# Patient Record
Sex: Female | Born: 1970 | Race: Black or African American | Hispanic: No | Marital: Married | Smoking: Former smoker
Health system: Southern US, Community
[De-identification: ages and names within clinical notes are randomized; demographics above are authoritative.]

## PROBLEM LIST (undated history)

## (undated) DIAGNOSIS — N6452 Nipple discharge: Secondary | ICD-10-CM

## (undated) DIAGNOSIS — G8929 Other chronic pain: Secondary | ICD-10-CM

## (undated) DIAGNOSIS — M1990 Unspecified osteoarthritis, unspecified site: Secondary | ICD-10-CM

## (undated) DIAGNOSIS — J45909 Unspecified asthma, uncomplicated: Secondary | ICD-10-CM

## (undated) DIAGNOSIS — E669 Obesity, unspecified: Secondary | ICD-10-CM

## (undated) DIAGNOSIS — I10 Essential (primary) hypertension: Secondary | ICD-10-CM

## (undated) DIAGNOSIS — K219 Gastro-esophageal reflux disease without esophagitis: Secondary | ICD-10-CM

## (undated) DIAGNOSIS — G5600 Carpal tunnel syndrome, unspecified upper limb: Secondary | ICD-10-CM

## (undated) DIAGNOSIS — E785 Hyperlipidemia, unspecified: Secondary | ICD-10-CM

## (undated) DIAGNOSIS — M549 Dorsalgia, unspecified: Secondary | ICD-10-CM

## (undated) HISTORY — DX: Essential (primary) hypertension: I10

## (undated) HISTORY — DX: Obesity, unspecified: E66.9

## (undated) HISTORY — PX: SPINE SURGERY: SHX786

## (undated) HISTORY — PX: CARPAL TUNNEL RELEASE: SHX101

## (undated) HISTORY — DX: Hyperlipidemia, unspecified: E78.5

## (undated) HISTORY — PX: TONSILLECTOMY: SUR1361

## (undated) HISTORY — DX: Other chronic pain: G89.29

## (undated) HISTORY — DX: Carpal tunnel syndrome, unspecified upper limb: G56.00

## (undated) HISTORY — DX: Unspecified osteoarthritis, unspecified site: M19.90

## (undated) HISTORY — DX: Gastro-esophageal reflux disease without esophagitis: K21.9

## (undated) HISTORY — DX: Dorsalgia, unspecified: M54.9

## (undated) HISTORY — PX: CHOLECYSTECTOMY: SHX55

## (undated) HISTORY — PX: OTHER SURGICAL HISTORY: SHX169

## (undated) HISTORY — DX: Unspecified asthma, uncomplicated: J45.909

---

## 2000-11-25 ENCOUNTER — Encounter: Payer: Self-pay | Admitting: Emergency Medicine

## 2000-11-25 ENCOUNTER — Emergency Department (HOSPITAL_COMMUNITY): Admission: EM | Admit: 2000-11-25 | Discharge: 2000-11-25 | Payer: Self-pay | Admitting: Emergency Medicine

## 2000-12-12 ENCOUNTER — Encounter (HOSPITAL_COMMUNITY): Admission: RE | Admit: 2000-12-12 | Discharge: 2001-01-11 | Payer: Self-pay | Admitting: Family Medicine

## 2001-01-03 HISTORY — PX: ABDOMINAL HYSTERECTOMY: SHX81

## 2001-04-16 ENCOUNTER — Ambulatory Visit (HOSPITAL_COMMUNITY): Admission: RE | Admit: 2001-04-16 | Discharge: 2001-04-16 | Payer: Self-pay | Admitting: Family Medicine

## 2001-04-16 ENCOUNTER — Encounter: Payer: Self-pay | Admitting: Family Medicine

## 2001-05-18 ENCOUNTER — Inpatient Hospital Stay (HOSPITAL_COMMUNITY): Admission: RE | Admit: 2001-05-18 | Discharge: 2001-05-21 | Payer: Self-pay | Admitting: General Surgery

## 2001-08-30 ENCOUNTER — Encounter: Payer: Self-pay | Admitting: Family Medicine

## 2001-08-30 ENCOUNTER — Ambulatory Visit (HOSPITAL_COMMUNITY): Admission: RE | Admit: 2001-08-30 | Discharge: 2001-08-30 | Payer: Self-pay | Admitting: Family Medicine

## 2001-09-15 ENCOUNTER — Encounter: Payer: Self-pay | Admitting: *Deleted

## 2001-09-15 ENCOUNTER — Emergency Department (HOSPITAL_COMMUNITY): Admission: EM | Admit: 2001-09-15 | Discharge: 2001-09-15 | Payer: Self-pay | Admitting: *Deleted

## 2001-09-20 ENCOUNTER — Ambulatory Visit (HOSPITAL_COMMUNITY): Admission: RE | Admit: 2001-09-20 | Discharge: 2001-09-20 | Payer: Self-pay | Admitting: Family Medicine

## 2001-09-20 ENCOUNTER — Encounter: Payer: Self-pay | Admitting: Family Medicine

## 2001-12-28 ENCOUNTER — Ambulatory Visit (HOSPITAL_COMMUNITY): Admission: RE | Admit: 2001-12-28 | Discharge: 2001-12-28 | Payer: Self-pay | Admitting: Family Medicine

## 2001-12-28 ENCOUNTER — Encounter: Payer: Self-pay | Admitting: Family Medicine

## 2002-03-31 ENCOUNTER — Emergency Department (HOSPITAL_COMMUNITY): Admission: EM | Admit: 2002-03-31 | Discharge: 2002-03-31 | Payer: Self-pay | Admitting: Emergency Medicine

## 2002-06-28 ENCOUNTER — Emergency Department (HOSPITAL_COMMUNITY): Admission: EM | Admit: 2002-06-28 | Discharge: 2002-06-29 | Payer: Self-pay | Admitting: *Deleted

## 2002-06-29 ENCOUNTER — Encounter: Payer: Self-pay | Admitting: *Deleted

## 2002-07-02 ENCOUNTER — Encounter: Payer: Self-pay | Admitting: Family Medicine

## 2002-07-02 ENCOUNTER — Ambulatory Visit (HOSPITAL_COMMUNITY): Admission: RE | Admit: 2002-07-02 | Discharge: 2002-07-02 | Payer: Self-pay | Admitting: Family Medicine

## 2002-07-15 ENCOUNTER — Ambulatory Visit (HOSPITAL_COMMUNITY): Admission: RE | Admit: 2002-07-15 | Discharge: 2002-07-15 | Payer: Self-pay | Admitting: Family Medicine

## 2003-01-04 HISTORY — PX: OTHER SURGICAL HISTORY: SHX169

## 2003-02-21 ENCOUNTER — Ambulatory Visit (HOSPITAL_COMMUNITY): Admission: RE | Admit: 2003-02-21 | Discharge: 2003-02-21 | Payer: Self-pay | Admitting: Family Medicine

## 2003-02-25 ENCOUNTER — Ambulatory Visit (HOSPITAL_COMMUNITY): Admission: RE | Admit: 2003-02-25 | Discharge: 2003-02-25 | Payer: Self-pay | Admitting: Obstetrics and Gynecology

## 2003-03-05 ENCOUNTER — Inpatient Hospital Stay (HOSPITAL_COMMUNITY): Admission: RE | Admit: 2003-03-05 | Discharge: 2003-03-09 | Payer: Self-pay | Admitting: Obstetrics and Gynecology

## 2003-03-05 ENCOUNTER — Encounter (INDEPENDENT_AMBULATORY_CARE_PROVIDER_SITE_OTHER): Payer: Self-pay | Admitting: Specialist

## 2003-03-05 ENCOUNTER — Encounter (INDEPENDENT_AMBULATORY_CARE_PROVIDER_SITE_OTHER): Payer: Self-pay | Admitting: *Deleted

## 2003-09-30 ENCOUNTER — Ambulatory Visit (HOSPITAL_COMMUNITY): Admission: RE | Admit: 2003-09-30 | Discharge: 2003-09-30 | Payer: Self-pay | Admitting: Unknown Physician Specialty

## 2004-01-14 ENCOUNTER — Ambulatory Visit: Payer: Self-pay | Admitting: Family Medicine

## 2004-04-06 ENCOUNTER — Ambulatory Visit: Payer: Self-pay | Admitting: Family Medicine

## 2004-11-03 ENCOUNTER — Emergency Department (HOSPITAL_COMMUNITY): Admission: EM | Admit: 2004-11-03 | Discharge: 2004-11-03 | Payer: Self-pay | Admitting: Emergency Medicine

## 2004-11-10 ENCOUNTER — Encounter (HOSPITAL_COMMUNITY): Admission: RE | Admit: 2004-11-10 | Discharge: 2004-12-15 | Payer: Self-pay | Admitting: Orthopaedic Surgery

## 2004-12-07 ENCOUNTER — Encounter: Payer: Self-pay | Admitting: Family Medicine

## 2004-12-07 LAB — CONVERTED CEMR LAB: Pap Smear: NORMAL

## 2005-01-12 ENCOUNTER — Ambulatory Visit: Payer: Self-pay | Admitting: Family Medicine

## 2005-03-30 ENCOUNTER — Ambulatory Visit: Payer: Self-pay | Admitting: Family Medicine

## 2005-07-04 ENCOUNTER — Ambulatory Visit: Payer: Self-pay | Admitting: Family Medicine

## 2005-07-07 ENCOUNTER — Ambulatory Visit (HOSPITAL_COMMUNITY): Admission: RE | Admit: 2005-07-07 | Discharge: 2005-07-07 | Payer: Self-pay | Admitting: Family Medicine

## 2005-07-26 ENCOUNTER — Ambulatory Visit: Payer: Self-pay | Admitting: Family Medicine

## 2005-09-20 ENCOUNTER — Ambulatory Visit (HOSPITAL_COMMUNITY): Admission: RE | Admit: 2005-09-20 | Discharge: 2005-09-20 | Payer: Self-pay | Admitting: Family Medicine

## 2005-09-21 ENCOUNTER — Ambulatory Visit: Payer: Self-pay | Admitting: Family Medicine

## 2005-10-19 ENCOUNTER — Ambulatory Visit: Payer: Self-pay | Admitting: Family Medicine

## 2006-09-06 ENCOUNTER — Ambulatory Visit: Payer: Self-pay | Admitting: Family Medicine

## 2006-11-14 ENCOUNTER — Ambulatory Visit (HOSPITAL_COMMUNITY): Admission: RE | Admit: 2006-11-14 | Discharge: 2006-11-14 | Payer: Self-pay | Admitting: Family Medicine

## 2006-11-15 ENCOUNTER — Ambulatory Visit (HOSPITAL_COMMUNITY): Admission: RE | Admit: 2006-11-15 | Discharge: 2006-11-15 | Payer: Self-pay | Admitting: Family Medicine

## 2007-01-08 ENCOUNTER — Ambulatory Visit: Payer: Self-pay | Admitting: Family Medicine

## 2007-01-08 LAB — CONVERTED CEMR LAB
BUN: 14 mg/dL (ref 6–23)
Basophils Relative: 0 % (ref 0–1)
Chloride: 101 meq/L (ref 96–112)
LDL Cholesterol: 127 mg/dL — ABNORMAL HIGH (ref 0–99)
Lymphs Abs: 2.7 10*3/uL (ref 0.7–4.0)
MCHC: 33.4 g/dL (ref 30.0–36.0)
Monocytes Relative: 7 % (ref 3–12)
Neutro Abs: 5.1 10*3/uL (ref 1.7–7.7)
Neutrophils Relative %: 60 % (ref 43–77)
Platelets: 253 10*3/uL (ref 150–400)
Potassium: 4 meq/L (ref 3.5–5.3)
RBC: 4.97 M/uL (ref 3.87–5.11)
Sodium: 140 meq/L (ref 135–145)
Total CHOL/HDL Ratio: 3.6
Triglycerides: 138 mg/dL (ref <150)
VLDL: 28 mg/dL (ref 0–40)
WBC: 8.4 10*3/uL (ref 4.0–10.5)

## 2007-03-01 ENCOUNTER — Encounter: Admission: RE | Admit: 2007-03-01 | Discharge: 2007-03-01 | Payer: Self-pay | Admitting: Family Medicine

## 2007-06-19 ENCOUNTER — Ambulatory Visit: Payer: Self-pay | Admitting: Family Medicine

## 2007-06-26 ENCOUNTER — Encounter: Payer: Self-pay | Admitting: Family Medicine

## 2007-06-26 DIAGNOSIS — I10 Essential (primary) hypertension: Secondary | ICD-10-CM | POA: Insufficient documentation

## 2007-06-26 DIAGNOSIS — E669 Obesity, unspecified: Secondary | ICD-10-CM | POA: Insufficient documentation

## 2007-06-26 DIAGNOSIS — Z6841 Body Mass Index (BMI) 40.0 and over, adult: Secondary | ICD-10-CM | POA: Insufficient documentation

## 2007-06-26 DIAGNOSIS — M62830 Muscle spasm of back: Secondary | ICD-10-CM | POA: Insufficient documentation

## 2007-06-26 DIAGNOSIS — K219 Gastro-esophageal reflux disease without esophagitis: Secondary | ICD-10-CM | POA: Insufficient documentation

## 2007-06-26 DIAGNOSIS — G5600 Carpal tunnel syndrome, unspecified upper limb: Secondary | ICD-10-CM | POA: Insufficient documentation

## 2007-08-03 ENCOUNTER — Telehealth: Payer: Self-pay | Admitting: Family Medicine

## 2007-08-09 ENCOUNTER — Ambulatory Visit: Admission: RE | Admit: 2007-08-09 | Discharge: 2007-08-09 | Payer: Self-pay | Admitting: Neurology

## 2007-08-14 ENCOUNTER — Encounter: Payer: Self-pay | Admitting: Family Medicine

## 2007-08-27 ENCOUNTER — Telehealth: Payer: Self-pay | Admitting: Family Medicine

## 2007-08-28 ENCOUNTER — Telehealth (INDEPENDENT_AMBULATORY_CARE_PROVIDER_SITE_OTHER): Payer: Self-pay | Admitting: *Deleted

## 2007-09-14 ENCOUNTER — Encounter: Payer: Self-pay | Admitting: Family Medicine

## 2007-10-02 ENCOUNTER — Emergency Department (HOSPITAL_COMMUNITY): Admission: EM | Admit: 2007-10-02 | Discharge: 2007-10-03 | Payer: Self-pay | Admitting: Emergency Medicine

## 2007-10-02 ENCOUNTER — Encounter: Payer: Self-pay | Admitting: Family Medicine

## 2007-10-02 ENCOUNTER — Ambulatory Visit: Payer: Self-pay | Admitting: Family Medicine

## 2007-10-02 ENCOUNTER — Telehealth: Payer: Self-pay | Admitting: Family Medicine

## 2007-10-02 DIAGNOSIS — M542 Cervicalgia: Secondary | ICD-10-CM | POA: Insufficient documentation

## 2007-10-03 ENCOUNTER — Ambulatory Visit (HOSPITAL_COMMUNITY): Admission: RE | Admit: 2007-10-03 | Discharge: 2007-10-03 | Payer: Self-pay | Admitting: Emergency Medicine

## 2007-10-04 ENCOUNTER — Encounter: Payer: Self-pay | Admitting: Family Medicine

## 2007-10-04 DIAGNOSIS — K859 Acute pancreatitis without necrosis or infection, unspecified: Secondary | ICD-10-CM | POA: Insufficient documentation

## 2007-10-17 ENCOUNTER — Ambulatory Visit (HOSPITAL_COMMUNITY): Admission: RE | Admit: 2007-10-17 | Discharge: 2007-10-17 | Payer: Self-pay | Admitting: Family Medicine

## 2008-01-07 ENCOUNTER — Encounter: Payer: Self-pay | Admitting: Family Medicine

## 2008-01-23 ENCOUNTER — Ambulatory Visit: Payer: Self-pay | Admitting: Family Medicine

## 2008-01-23 DIAGNOSIS — R5383 Other fatigue: Secondary | ICD-10-CM

## 2008-01-23 DIAGNOSIS — R5381 Other malaise: Secondary | ICD-10-CM | POA: Insufficient documentation

## 2008-01-24 ENCOUNTER — Encounter: Payer: Self-pay | Admitting: Family Medicine

## 2008-01-24 ENCOUNTER — Telehealth: Payer: Self-pay | Admitting: Family Medicine

## 2008-03-10 ENCOUNTER — Telehealth: Payer: Self-pay | Admitting: Family Medicine

## 2008-03-10 ENCOUNTER — Emergency Department (HOSPITAL_COMMUNITY): Admission: EM | Admit: 2008-03-10 | Discharge: 2008-03-10 | Payer: Self-pay | Admitting: Emergency Medicine

## 2008-03-19 ENCOUNTER — Encounter (INDEPENDENT_AMBULATORY_CARE_PROVIDER_SITE_OTHER): Payer: Self-pay | Admitting: General Surgery

## 2008-03-19 ENCOUNTER — Ambulatory Visit (HOSPITAL_COMMUNITY): Admission: RE | Admit: 2008-03-19 | Discharge: 2008-03-19 | Payer: Self-pay | Admitting: General Surgery

## 2008-03-25 ENCOUNTER — Ambulatory Visit: Payer: Self-pay | Admitting: Family Medicine

## 2008-04-15 ENCOUNTER — Encounter: Payer: Self-pay | Admitting: Family Medicine

## 2008-05-12 ENCOUNTER — Encounter: Payer: Self-pay | Admitting: Family Medicine

## 2008-06-09 ENCOUNTER — Encounter: Payer: Self-pay | Admitting: Family Medicine

## 2008-08-01 ENCOUNTER — Encounter: Payer: Self-pay | Admitting: Family Medicine

## 2008-08-12 ENCOUNTER — Telehealth: Payer: Self-pay | Admitting: Family Medicine

## 2008-09-03 ENCOUNTER — Encounter: Payer: Self-pay | Admitting: Family Medicine

## 2008-10-06 ENCOUNTER — Encounter: Payer: Self-pay | Admitting: Family Medicine

## 2008-11-10 ENCOUNTER — Telehealth: Payer: Self-pay | Admitting: Family Medicine

## 2008-11-25 ENCOUNTER — Telehealth: Payer: Self-pay | Admitting: Family Medicine

## 2008-12-12 ENCOUNTER — Ambulatory Visit: Payer: Self-pay | Admitting: Family Medicine

## 2008-12-12 DIAGNOSIS — L259 Unspecified contact dermatitis, unspecified cause: Secondary | ICD-10-CM | POA: Insufficient documentation

## 2009-01-01 ENCOUNTER — Telehealth: Payer: Self-pay | Admitting: Family Medicine

## 2009-02-02 ENCOUNTER — Encounter: Payer: Self-pay | Admitting: Family Medicine

## 2009-02-10 ENCOUNTER — Ambulatory Visit: Payer: Self-pay | Admitting: Family Medicine

## 2009-02-17 ENCOUNTER — Telehealth: Payer: Self-pay | Admitting: Family Medicine

## 2009-03-26 ENCOUNTER — Telehealth: Payer: Self-pay | Admitting: Family Medicine

## 2009-03-31 ENCOUNTER — Telehealth: Payer: Self-pay | Admitting: Family Medicine

## 2009-04-04 ENCOUNTER — Encounter: Payer: Self-pay | Admitting: Family Medicine

## 2009-04-07 LAB — CONVERTED CEMR LAB
ALT: 18 units/L (ref 0–35)
Alkaline Phosphatase: 69 units/L (ref 39–117)
BUN: 15 mg/dL (ref 6–23)
Basophils Absolute: 0 10*3/uL (ref 0.0–0.1)
Basophils Relative: 1 % (ref 0–1)
Bilirubin, Direct: 0.1 mg/dL (ref 0.0–0.3)
Cholesterol: 212 mg/dL — ABNORMAL HIGH (ref 0–200)
Creatinine, Ser: 0.92 mg/dL (ref 0.40–1.20)
Eosinophils Absolute: 0.1 10*3/uL (ref 0.0–0.7)
Eosinophils Relative: 2 % (ref 0–5)
Glucose, Bld: 88 mg/dL (ref 70–99)
HCT: 45.1 % (ref 36.0–46.0)
Hemoglobin: 15.4 g/dL — ABNORMAL HIGH (ref 12.0–15.0)
Indirect Bilirubin: 0.3 mg/dL (ref 0.0–0.9)
MCHC: 34.1 g/dL (ref 30.0–36.0)
MCV: 89.7 fL (ref 78.0–100.0)
Monocytes Absolute: 0.5 10*3/uL (ref 0.1–1.0)
Platelets: 241 10*3/uL (ref 150–400)
RDW: 13 % (ref 11.5–15.5)
Total Protein: 7.3 g/dL (ref 6.0–8.3)
Triglycerides: 92 mg/dL (ref <150)
VLDL: 18 mg/dL (ref 0–40)

## 2009-04-10 ENCOUNTER — Telehealth: Payer: Self-pay | Admitting: Family Medicine

## 2009-04-27 ENCOUNTER — Ambulatory Visit: Payer: Self-pay | Admitting: Family Medicine

## 2009-04-27 DIAGNOSIS — K649 Unspecified hemorrhoids: Secondary | ICD-10-CM | POA: Insufficient documentation

## 2009-04-27 DIAGNOSIS — K625 Hemorrhage of anus and rectum: Secondary | ICD-10-CM | POA: Insufficient documentation

## 2009-05-05 ENCOUNTER — Telehealth: Payer: Self-pay | Admitting: Family Medicine

## 2009-05-11 ENCOUNTER — Encounter (INDEPENDENT_AMBULATORY_CARE_PROVIDER_SITE_OTHER): Payer: Self-pay | Admitting: *Deleted

## 2009-05-18 ENCOUNTER — Telehealth: Payer: Self-pay | Admitting: Family Medicine

## 2009-05-28 ENCOUNTER — Telehealth: Payer: Self-pay | Admitting: Family Medicine

## 2009-05-29 ENCOUNTER — Encounter: Payer: Self-pay | Admitting: Family Medicine

## 2009-06-24 ENCOUNTER — Telehealth: Payer: Self-pay | Admitting: Family Medicine

## 2009-07-02 ENCOUNTER — Ambulatory Visit: Payer: Self-pay | Admitting: Family Medicine

## 2009-08-25 ENCOUNTER — Telehealth: Payer: Self-pay | Admitting: Family Medicine

## 2009-09-02 ENCOUNTER — Encounter: Admission: RE | Admit: 2009-09-02 | Discharge: 2009-09-02 | Payer: Self-pay | Admitting: Obstetrics and Gynecology

## 2009-09-22 ENCOUNTER — Telehealth: Payer: Self-pay | Admitting: Family Medicine

## 2009-09-25 ENCOUNTER — Encounter (INDEPENDENT_AMBULATORY_CARE_PROVIDER_SITE_OTHER): Payer: Self-pay

## 2009-09-25 ENCOUNTER — Ambulatory Visit: Payer: Self-pay | Admitting: Family Medicine

## 2009-09-28 ENCOUNTER — Telehealth: Payer: Self-pay | Admitting: Family Medicine

## 2009-10-14 ENCOUNTER — Telehealth: Payer: Self-pay | Admitting: Family Medicine

## 2009-10-16 ENCOUNTER — Encounter: Payer: Self-pay | Admitting: Family Medicine

## 2009-11-09 ENCOUNTER — Telehealth: Payer: Self-pay | Admitting: Family Medicine

## 2009-11-20 ENCOUNTER — Telehealth: Payer: Self-pay | Admitting: Family Medicine

## 2009-11-23 ENCOUNTER — Telehealth (INDEPENDENT_AMBULATORY_CARE_PROVIDER_SITE_OTHER): Payer: Self-pay | Admitting: *Deleted

## 2009-12-22 ENCOUNTER — Telehealth (INDEPENDENT_AMBULATORY_CARE_PROVIDER_SITE_OTHER): Payer: Self-pay | Admitting: *Deleted

## 2010-01-05 ENCOUNTER — Telehealth: Payer: Self-pay | Admitting: Family Medicine

## 2010-01-21 ENCOUNTER — Telehealth: Payer: Self-pay | Admitting: Family Medicine

## 2010-01-23 ENCOUNTER — Encounter: Payer: Self-pay | Admitting: Obstetrics and Gynecology

## 2010-01-24 ENCOUNTER — Encounter: Payer: Self-pay | Admitting: Obstetrics and Gynecology

## 2010-01-24 ENCOUNTER — Encounter: Payer: Self-pay | Admitting: Family Medicine

## 2010-02-02 NOTE — Progress Notes (Signed)
Summary: diet pills  Phone Note Call from Patient   Summary of Call: has not been taking her diet medicine and will start back just wanted dr. Lodema Hong to know this Initial call taken by: Lind Guest,  November 20, 2009 12:46 PM  Follow-up for Phone Call        noted, pls send in 30 if she needs them Follow-up by: Syliva Overman MD,  November 20, 2009 2:26 PM

## 2010-02-02 NOTE — Progress Notes (Signed)
Summary: RX  Phone Note Call from Patient   Summary of Call: WANTS A RXFOR THE STOP SMOKING PATCHES AND A COUGH MEDICINE SENT TO  OUTPATIENT AT [facility].    CALL HERE BACK AT [phone] TO LET HER KNOW Initial call taken by: Lind Guest,  May 05, 2009 9:52 AM  Follow-up for Phone Call        prescriptiions sent in pls fax and let her know  Follow-up by: Syliva Overman MD,  May 05, 2009 4:58 PM  Additional Follow-up for Phone Call Additional follow up Details #1::        script written pls fax Additional Follow-up by: Syliva Overman MD,  May 06, 2009 10:33 AM    Additional Follow-up for Phone Call Additional follow up Details #2::    sent Follow-up by: Adella Hare LPN,  May 07, 4538 11:17 AM  New/Updated Medications: NICODERM CQ 21 MG/24HR PT24 (NICOTINE) apply one patch daily for 2 weeks NICODERM CQ 14 MG/24HR PT24 (NICOTINE) apply one patch for 2 weeks start mid May NICODERM CQ 7 MG/24HR PT24 (NICOTINE) apply one patch daily for 2 weeks, start beginning of june TUSSIONEX PENNKINETIC ER 8-10 MG/5ML LQCR (CHLORPHENIRAMINE-HYDROCODONE) one teaspoon twice daily as needed for cough Prescriptions: TUSSIONEX PENNKINETIC ER 8-10 MG/5ML LQCR (CHLORPHENIRAMINE-HYDROCODONE) one teaspoon twice daily as needed for cough  #300cc x 0   Entered and Authorized by:   Syliva Overman MD   Signed by:   Syliva Overman MD on 05/05/2009   Method used:   Printed then faxed to ...       [facility]* (retail)       [address]       [address] Shipping/mailing       [place], [place]  [address]       Ph: [phone]       Fax: [phone]   RxID:   [id number] NICODERM CQ 7 MG/24HR PT24 (NICOTINE) apply one patch daily for 2 weeks, start beginning of june  #14 x 0   Entered and Authorized by:   Syliva Overman MD   Signed by:   Syliva Overman MD on 05/05/2009   Method used:   Electronically to        [facility]* (retail)  [address]       [address] Shipping/mailing       [place], [place]  [address]       Ph: [phone]       Fax: [phone]   RxID:   [id number] NICODERM CQ 14 MG/24HR PT24 (NICOTINE) apply one patch for 2 weeks start mid May  #14 x 0   Entered and Authorized by:   Syliva Overman MD   Signed by:   Syliva Overman MD on 05/05/2009   Method used:   Electronically to        [facility]* (retail)       [address]       [address] Shipping/mailing       [place], [place]  [address]       Ph: [phone]       Fax: [phone]   RxID:   [id number] NICODERM CQ 21 MG/24HR PT24 (NICOTINE) apply one patch daily for 2 weeks  #14 x 0   Entered and Authorized by:   Syliva Overman MD   Signed by:   Syliva Overman MD on 05/05/2009  Method used:   Electronically to        [facility]* (retail)       [address]       [address] Shipping/mailing       [place], [place]  [address]       Ph: [phone]       Fax: [phone]   RxID:   [id number]

## 2010-02-02 NOTE — Progress Notes (Signed)
Summary: PAPERS  Phone Note Call from Patient   Summary of Call: WHERE ARE THE PAPERS SHE NEEDS THE PAPERS THE DEADLINE IS THE 15TH AND PLEASE FAX TO [facility] AT [facility]  Initial call taken by: Lind Guest,  October 14, 2009 4:31 PM  Follow-up for Phone Call        paers up front but she will need to look at them, no where for doc to sign???, two identical papers which i tiched in 2 boxes, that's all.I will show you what i mean so when giving it to her you can explain Follow-up by: Syliva Overman MD,  October 14, 2009 8:56 PM  Additional Follow-up for Phone Call Additional follow up Details #1::        everything is completed faxed and mailed her a copy Additional Follow-up by: Lind Guest,  October 19, 2009 7:58 AM

## 2010-02-02 NOTE — Progress Notes (Signed)
Summary: DIET PILLS  Phone Note Call from Patient   Summary of Call: NEEDS HER DIET PILLS SENT TO [facility]. TODAY SHE HAS AN APPT ON THE 25 OF THIS MONTH  CALL HER WHEN DONE Initial call taken by: Lind Guest,  April 10, 2009 11:10 AM  Follow-up for Phone Call        script entered pls fax to [facility] Follow-up by: Syliva Overman MD,  April 10, 2009 12:31 PM  Additional Follow-up for Phone Call Additional follow up Details #1::        Faxed to [facility].    Additional Follow-up for Phone Call Additional follow up Details #2::    faxed and called patient and left message Follow-up by: Everitt Amber LPN,  April 10, 2009 2:31 PM  New/Updated Medications: PHENTERMINE HCL 37.5 MG TABS (PHENTERMINE HCL) Take 1 tablet by mouth once a day Prescriptions: PHENTERMINE HCL 37.5 MG TABS (PHENTERMINE HCL) Take 1 tablet by mouth once a day  #30 x 0   Entered and Authorized by:   Syliva Overman MD   Signed by:   Syliva Overman MD on 04/10/2009   Method used:   Printed then faxed to ...       [facility]* (retail)       [address]       [address] Shipping/mailing       [place], [place]  [address]       Ph: [phone]       Fax: [phone]   RxID:   [id number]   Appended Document: DIET PILLS patient aware

## 2010-02-02 NOTE — Assessment & Plan Note (Signed)
Summary: F UP   Vital Signs:  Patient profile:   40 year old female Menstrual status:  hysterectomy Height:      61 inches Weight:      261.50 pounds BMI:     49.59 O2 Sat:      95 % Pulse rate:   96 / minute Pulse rhythm:   regular Resp:     16 per minute BP sitting:   140 / 86  (right arm) Cuff size:   xl  Vitals Entered By: Everitt Amber LPN  Nutrition Counseling: Patient's BMI is greater than 25 and therefore counseled on weight management options. CC: Follow up chronic problems   Primary Care Provider:  Syliva Overman MD  CC:  Follow up chronic problems.  History of Present Illness: Reports  that she has been doing well, states that she has not been treen taking the phentermine regularly, but wishe s to do so. She has recently quit nicotine and is having inc difficulty curbing her apetite. Denies recent fever or chills. Denies sinus pressure, nasal congestion , ear pain or sore throat. Denies chest congestion, or cough productive of sputum. Denies chest pain, palpitations, PND, orthopnea or leg swelling. Denies abdominal pain, nausea, vomitting, diarrhea or constipation. Denies change in bowel movements or bloody stool. Denies dysuria , frequency, incontinence or hesitancy. Denies  joint pain, swelling, or reduced mobility.She does have chgronic back pain. She also c/o worsened hand pain from carpal tunnel synd and wants this evaluated and treated Denies headaches, vertigo, seizures. Denies depression, anxiety or insomnia. Denies  rash, lesions, or itch.     Preventive Screening-Counseling & Management  Alcohol-Tobacco     Smoking Status: quit  Current Medications (verified): 1)  Naproxen 500 Mg  Tabs (Naproxen) .... One Tab By Mouth Two Times A Day As Needed For Pain 2)  Flexeril 10 Mg Tabs (Cyclobenzaprine Hcl) .... Take 1 Tablet By Mouth Two Times A Day As Needed 3)  Enjuvia 1.25 Mg Tabs (Estrogens Conj Synthetic B) .... One Tab By Mouth Qd 4)  Alprazolam  1 Mg Tabs (Alprazolam) .... Take 1 Tab By Mouth At Bedtime As Needed 5)  Nexium 40 Mg Cpdr (Esomeprazole Magnesium) .... One Cap By Mouth Once Daily 6)  Phentermine Hcl 37.5 Mg Tabs (Phentermine Hcl) .... Take 1 Tablet By Mouth Once A Day 7)  Amlodipine Besylate 5 Mg Tabs (Amlodipine Besylate) .... Take 1 Tablet By Mouth Once A Day 8)  Vimovo 500-20 Mg Tbec (Naproxen-Esomeprazole) .... Take 1 Tablet By Mouth Two Times A Day For 5 Days , Then As Needed  Allergies (verified): 1)  ! Codeine 2)  ! Morphine  Past History:  Past surgical history reviewed for relevance to current acute and chronic problems.  Past Surgical History: Reviewed history from 03/25/2008 and no changes required. Partial hysterectomy Caesarean section Tonsillectomy BSO Appendectomy cholecystectomy 2010  Social History: Employed Divorced One child Quit nicotine in 2011 Alcohol use-no Drug use-no Smoking Status:  quit  Review of Systems      See HPI General:  Complains of fatigue. Eyes:  Complains of vision loss-both eyes; wears corrective lenses . MS:  Complains of low back pain and mid back pain. Endo:  Denies cold intolerance, excessive hunger, excessive thirst, excessive urination, heat intolerance, polyuria, and weight change. Heme:  Denies abnormal bruising and bleeding. Allergy:  Denies hives or rash and itching eyes.  Physical Exam  General:  Well-developed,well-nourished,in no acute distress; alert,appropriate and cooperative throughout examination HEENT: No facial  asymmetry,  EOMI,no sinus tenderness, TM' clear, oropharynx  pink and moist.   Chest:clear to ascultation CVS: S1, S2, No murmurs, No S3.   Abd: Soft, Nontender.  MS: decreased  ROM spine,adequate in  hips, shoulders and knees. Positive tinnel's sign Ext: No edema.   CNS: CN 2-12 intact, power tone and sensation normal throughout.   Skin: Intact, no visible lesions or rashes.  Psych: Good eye contact, normal affect.  Memory  intact, not anxious or depressed appearing.    Impression & Recommendations:  Problem # 1:  CARPAL TUNNEL SYNDROME (ICD-354.0) Assessment Deteriorated  Orders: Orthopedic Referral (Ortho)  Problem # 2:  OBESITY (ICD-278.00) Assessment: Deteriorated  Ht: 61 (07/02/2009)   Wt: 261.50 (07/02/2009)   BMI: 49.59 (07/02/2009)  Problem # 3:  HYPERTENSION (ICD-401.9) Assessment: Deteriorated  Her updated medication list for this problem includes:    Amlodipine Besylate 5 Mg Tabs (Amlodipine besylate) .Marland Kitchen... Take 1 tablet by mouth once a day  BP today: 140/86 Prior BP: 120/90 (04/27/2009)  Labs Reviewed: K+: 3.4 (04/04/2009) Creat: : 0.92 (04/04/2009)   Chol: 212 (04/04/2009)   HDL: 40 (04/04/2009)   LDL: 154 (04/04/2009)   TG: 92 (04/04/2009)  Complete Medication List: 1)  Naproxen 500 Mg Tabs (Naproxen) .... One tab by mouth two times a day as needed for pain 2)  Flexeril 10 Mg Tabs (Cyclobenzaprine hcl) .... Take 1 tablet by mouth two times a day as needed 3)  Enjuvia 1.25 Mg Tabs (Estrogens conj synthetic b) .... One tab by mouth qd 4)  Alprazolam 1 Mg Tabs (Alprazolam) .... Take 1 tab by mouth at bedtime as needed 5)  Nexium 40 Mg Cpdr (Esomeprazole magnesium) .... One cap by mouth once daily 6)  Amlodipine Besylate 5 Mg Tabs (Amlodipine besylate) .... Take 1 tablet by mouth once a day 7)  Vimovo 500-20 Mg Tbec (Naproxen-esomeprazole) .... Take 1 tablet by mouth two times a day for 5 days , then as needed 8)  Phentermine Hcl 37.5 Mg Caps (Phentermine hcl) .... Take 1 tablet by mouth once a day  Patient Instructions: 1)  Please schedule a follow-up appointment in 2 months. 2)  It is important that you exercise regularly at least 20 minutes 5 times a week. If you develop chest pain, have severe difficulty breathing, or feel very tired , stop exercising immediately and seek medical attention. 3)  You need to lose weight. Consider a lower calorie diet and regular exercise. Goal at  least 4 pounds per month 4)  You will be referred to Dr Amanda Pea  Prescriptions: PHENTERMINE HCL 37.5 MG CAPS (PHENTERMINE HCL) Take 1 tablet by mouth once a day  #30 x 1   Entered and Authorized by:   Syliva Overman MD   Signed by:   Syliva Overman MD on 07/02/2009   Method used:   Printed then faxed to ...       [facility]* (retail)       [address]       [address] Shipping/mailing       [place], [place]  [address]       Ph: [phone]       Fax: [phone]   RxID:   [id number]

## 2010-02-02 NOTE — Assessment & Plan Note (Signed)
Summary: office visit   Vital Signs:  Patient profile:   40 year old female Menstrual status:  hysterectomy Height:      61 inches Weight:      260.25 pounds BMI:     49.35 O2 Sat:      99 % on Room air Pulse rate:   104 / minute Pulse rhythm:   regular Resp:     16 per minute BP sitting:   120 / 80  (left arm)  Vitals Entered By: Worthy Keeler LPN (February 10, 2009 1:05 PM)  Nutrition Counseling: Patient's BMI is greater than 25 and therefore counseled on weight management options.  O2 Flow:  Room air CC: follow-up visit- head congestion, hoarse, acid reflux, hemmoroids Is Patient Diabetic? No Pain Assessment Patient in pain? no        Primary Care Provider:  Syliva Overman MD  CC:  follow-up visit- head congestion, hoarse, acid reflux, and hemmoroids.  History of Present Illness: 3 day h/o chest congestion, cough productive of yellow sputum, chills, right ear pain, hoarseness, and headache, left supraorbital. No fever. currently 1PPD smoker but plans to work on quitting. She is concerned about her weight and intends to start phentermine along with lifestyle chnges for weight loss. Reports  that prior to this she had been doing well  Denies chest pain, palpitations, PND, orthopnea or leg swelling. Denies abdominal pain, nausea, vomitting, diarrhea or constipation. Denies change in bowel movements or bloody stool. Denies dysuria , frequency, incontinence or hesitancy. Denies  joint pain, swelling, or reduced mobility.suffers from chronicback pin  however. Denies headaches, vertigo, seizures. Denies depression, reports anxiety and  insomnia. Denies  rash, lesions, or itch.     Preventive Screening-Counseling & Management  Alcohol-Tobacco     Smoking Cessation Counseling: yes  Current Medications (verified): 1)  Naproxen 500 Mg  Tabs (Naproxen) .... One Tab By Mouth Two Times A Day As Needed For Pain 2)  Flexeril 10 Mg Tabs (Cyclobenzaprine Hcl) .... Take 1  Tablet By Mouth Two Times A Day As Needed 3)  Maxzide-25 37.5-25 Mg Tabs (Triamterene-Hctz) .... Take 1 Tablet By Mouth Once A Day 4)  Enjuvia 1.25 Mg Tabs (Estrogens Conj Synthetic B) .... One Tab By Mouth Qd 5)  Phentermine Hcl 37.5 Mg Tabs (Phentermine Hcl) .... Take 1 Tablet By Mouth Once A Day 6)  Alprazolam 1 Mg Tabs (Alprazolam) .... Take 1 Tab By Mouth At Bedtime As Needed  Allergies (verified): 1)  ! Codeine 2)  ! Morphine  Past History:  Past Medical History: Reviewed history from 06/26/2007 and no changes required. Current Problems:  GERD (ICD-530.81) CARPAL TUNNEL SYNDROME (ICD-354.0) BACK PAIN, CHRONIC (ICD-724.5) OBESITY (ICD-278.00) HYPERTENSION (ICD-401.9)  Review of Systems      See HPI Eyes:  Denies discharge and red eye. Endo:  Denies cold intolerance, excessive hunger, excessive thirst, excessive urination, heat intolerance, polyuria, and weight change. Heme:  Denies abnormal bruising and bleeding. Allergy:  Denies hives or rash and itching eyes.  Physical Exam  General:  Well-developed,well-nourished,in no acute distress; alert,appropriate and cooperative throughout examination HEENT: No facial asymmetry,  EOMI,positive  sinus tenderness, TM's erythematous, oropharynx  pink and moist.   Chest: decreased air entry, bilateral crackles and wheezes CVS: S1, S2, No murmurs, No S3.   Abd: Soft, Nontender.  MS: Adequate ROM spine, hips, shoulders and knees.  Ext: No edema.   CNS: CN 2-12 intact, power tone and sensation normal throughout.   Skin: Intact, no visible  lesions or rashes.  Psych: Good eye contact, normal affect.  Memory intact, not anxious or depressed appearing.    Impression & Recommendations:  Problem # 1:  ACUTE BRONCHITIS (ICD-466.0) Assessment Comment Only  The following medications were removed from the medication list:    Singulair 10 Mg Tabs (Montelukast sodium) .Marland Kitchen... Take 1 tablet by mouth once a day Her updated medication list  for this problem includes:    Septra Ds 800-160 Mg Tabs (Sulfamethoxazole-trimethoprim) .Marland Kitchen... Take 1 tablet by mouth two times a day    Tessalon Perles 100 Mg Caps (Benzonatate) .Marland Kitchen... Take 1 capsule by mouth three times a day  Orders: Rocephin  250mg  (Z6109) Admin of Therapeutic Inj  intramuscular or subcutaneous (60454) Albuterol Sulfate Sol 1mg  unit dose (U9811) Ipratropium inhalation sol. unit dose (B1478) Nebulizer Tx (29562)  Problem # 2:  NICOTINE ADDICTION (ICD-305.1) Assessment: Unchanged  The following medications were removed from the medication list:    Chantix Starting Month Pak 0.5 Mg X 11 & 1 Mg X 42 Tabs (Varenicline tartrate) ..... Uad    Chantix Continuing Month Pak 1 Mg Tabs (Varenicline tartrate) .Marland Kitchen... Take 1 tablet by mouth two times a day  Encouraged smoking cessation and discussed different methods for smoking cessation.   Problem # 3:  HYPERTENSION (ICD-401.9) Assessment: Unchanged  Her updated medication list for this problem includes:    Maxzide-25 37.5-25 Mg Tabs (Triamterene-hctz) .Marland Kitchen... Take 1 tablet by mouth once a day  BP today: 120/80 Prior BP: 118/78 (12/12/2008)  Labs Reviewed: K+: 4.0 (01/08/2007) Creat: : 0.77 (01/08/2007)   Chol: 215 (01/08/2007)   HDL: 60 (01/08/2007)   LDL: 127 (01/08/2007)   TG: 138 (01/08/2007)  Problem # 4:  OTHER ACUTE SINUSITIS (ICD-461.8) Assessment: Comment Only  The following medications were removed from the medication list:    Nasonex 50 Mcg/act Susp (Mometasone furoate) ..... Uad    Astepro 137 Mcg/spray Soln (Azelastine hcl) .Marland Kitchen... 2 puffs per nostril two times a day Her updated medication list for this problem includes:    Septra Ds 800-160 Mg Tabs (Sulfamethoxazole-trimethoprim) .Marland Kitchen... Take 1 tablet by mouth two times a day    Tessalon Perles 100 Mg Caps (Benzonatate) .Marland Kitchen... Take 1 capsule by mouth three times a day  Complete Medication List: 1)  Naproxen 500 Mg Tabs (Naproxen) .... One tab by mouth two times  a day as needed for pain 2)  Flexeril 10 Mg Tabs (Cyclobenzaprine hcl) .... Take 1 tablet by mouth two times a day as needed 3)  Maxzide-25 37.5-25 Mg Tabs (Triamterene-hctz) .... Take 1 tablet by mouth once a day 4)  Enjuvia 1.25 Mg Tabs (Estrogens conj synthetic b) .... One tab by mouth qd 5)  Phentermine Hcl 37.5 Mg Tabs (Phentermine hcl) .... Take 1 tablet by mouth once a day 6)  Alprazolam 1 Mg Tabs (Alprazolam) .... Take 1 tab by mouth at bedtime as needed 7)  Septra Ds 800-160 Mg Tabs (Sulfamethoxazole-trimethoprim) .... Take 1 tablet by mouth two times a day 8)  Tessalon Perles 100 Mg Caps (Benzonatate) .... Take 1 capsule by mouth three times a day 9)  Fluconazole 150 Mg Tabs (Fluconazole) .... Take 1 tablet by mouth once a day 10)  Nexium 40 Mg Cpdr (Esomeprazole magnesium) .... One cap by mouth once daily  Other Orders: Ketorolac-Toradol 15mg  (Z3086)  Patient Instructions: 1)  Please schedule a follow-up appointment in 2 months. 2)  You are being treated for bronchitis and sinusitis, and meds are being sent in.  3)  Tobacco is very bad for your health and your loved ones! You Should stop smoking!. 4)  Stop Smoking Tips: Choose a Quit date. Cut down before the Quit date. decide what you will do as a substitute when you feel the urge to smoke(gum,toothpick,exercise). 5)  It is important that you exercise regularly at least 20 minutes 5 times a week. If you develop chest pain, have severe difficulty breathing, or feel very tired , stop exercising immediately and seek medical attention. 6)  You need to lose weight. Consider a lower calorie diet and regular exercise.  Prescriptions: FLUCONAZOLE 150 MG TABS (FLUCONAZOLE) Take 1 tablet by mouth once a day  #3 x 0   Entered and Authorized by:   Syliva Overman MD   Signed by:   Syliva Overman MD on 02/10/2009   Method used:   Electronically to        [facility]* (retail)       [address]       [place], [place]  [address]       Ph: [phone]       Fax: [phone]   RxID:   [id number] TESSALON PERLES 100 MG CAPS (BENZONATATE) Take 1 capsule by mouth three times a day  #30 x 0   Entered and Authorized by:   Syliva Overman MD   Signed by:   Syliva Overman MD on 02/10/2009   Method used:   Electronically to        [facility]* (retail)       [address] [place], [place]  [address]       Ph: [phone]       Fax: [phone]   RxID:   [id number] SEPTRA DS 800-160 MG TABS (SULFAMETHOXAZOLE-TRIMETHOPRIM) Take 1 tablet by mouth two times a day  #20 x 0   Entered and Authorized by:   Syliva Overman MD   Signed by:   Syliva Overman MD on 02/10/2009   Method used:   Electronically to        [facility]* (retail)       [address] [place], [place]  [address]       Ph: [phone]       Fax: [phone]   RxID:   [id number]    Medication Administration  Injection # 1:    Medication: Rocephin  250mg     Diagnosis: ACUTE BRONCHITIS (ICD-466.0)    Route: IM    Site: RUOQ gluteus    Exp Date: 4/13    Lot #: [id number]    Mfr: novaplus    Comments: rocephin 500mg  given    Patient tolerated injection without complications    Given by: Worthy Keeler LPN (February 10, 2009 1:56 PM)  Injection # 2:    Medication: Ketorolac-Toradol 15mg     Diagnosis: BACK PAIN, CHRONIC (ICD-724.5)    Route: IM    Site: LUOQ gluteus    Exp Date: 09/04/2010    Lot #: [id number]    Mfr: hospira    Comments: toradol 60mg  given    Patient tolerated injection without complications    Given by: Worthy Keeler LPN (February 10, 2009 1:56 PM)  Medication # 1:    Medication: Albuterol Sulfate Sol 1mg  unit dose  Diagnosis: ACUTE BRONCHITIS (ICD-466.0)    Dose: 2.5mg     Route: inhaled    Exp Date: 8/11    Lot #: [id number]    Mfr: nephron pharm    Patient tolerated medication without  complications    Given by: Worthy Keeler LPN (February 10, 2009 1:57 PM)  Medication # 2:    Medication: Ipratropium inhalation sol. unit dose    Diagnosis: ACUTE BRONCHITIS (ICD-466.0)    Dose: 0.5mg     Route: inhaled    Exp Date: 5/11    Lot #: [id number]    Mfr: nephron pharm    Patient tolerated medication without complications    Given by: Worthy Keeler LPN (February 10, 2009 1:58 PM)  Orders Added: 1)  Est. Patient Level IV [13086] 2)  Rocephin  250mg  [J0696] 3)  Ketorolac-Toradol 15mg  [J1885] 4)  Admin of Therapeutic Inj  intramuscular or subcutaneous [96372] 5)  Albuterol Sulfate Sol 1mg  unit dose [J7613] 6)  Ipratropium inhalation sol. unit dose [J7644] 7)  Nebulizer Tx [phone]

## 2010-02-02 NOTE — Progress Notes (Signed)
Summary: SHOT  Phone Note Call from Patient   Summary of Call: WANTS TO KNOW IF SHE CAN COME IN ON [facility] AND GET A SHOT FOR HER BACK AND IS THERE ANYTHING STRONGER THAN THE TRAMODOL ? CALL BACK AT WORK AND LET HER KNOW  Initial call taken by: Lind Guest,  November 23, 2009 11:01 AM  Follow-up for Phone Call        i recommend depomedrol 80mg  iM and a prednioisonedose pack along with toradol 60mg  Im.  pls work in a nurse visit on wednesday morning.for the 2 injections , she can collect thescript at that time, no other meds recommended at this time Follow-up by: Syliva Overman MD,  November 23, 2009 12:39 PM  Additional Follow-up for Phone Call Additional follow up Details #1::        pls work in nurse visit wed morning and let her know Additional Follow-up by: Syliva Overman MD,  November 23, 2009 12:39 PM    Additional Follow-up for Phone Call Additional follow up Details #2::    GOING TO A ORTH TOMORROW DID NOT NEED A REF. Follow-up by: Lind Guest,  November 23, 2009 2:55 PM  Additional Follow-up for Phone Call Additional follow up Details #3:: Details for Additional Follow-up Action Taken: cacancel nurse visit pls Additional Follow-up by: Syliva Overman MD,  November 23, 2009 3:33 PM  New/Updated Medications: PREDNISONE (PAK) 5 MG TABS (PREDNISONE) Use as directed Prescriptions: PREDNISONE (PAK) 5 MG TABS (PREDNISONE) Use as directed  #21 x 0   Entered and Authorized by:   Syliva Overman MD   Signed by:   Syliva Overman MD on 11/23/2009   Method used:   Print then Give to Patient   RxID:   [id number]

## 2010-02-02 NOTE — Letter (Signed)
Summary: Work Excuse  [facility]  [address]   [place], [place] [address]   Phone: [phone]  Fax: [phone]    Today's Date: September 25, 2009  Name of Patient: Stephanie Cannon  The above named patient had a medical visit today at:  8:00am  Please take this into consideration when reviewing the time away from work/school.    Special Instructions:  [*] None  [  ] To be off the remainder of today, returning to the normal work / school schedule tomorrow.  [  ] To be off until the next scheduled appointment on ______________________.  [  ] Other ________________________________________________________________ ________________________________________________________________________   Sincerely yours,   Milus Mallick. Lodema Hong, M.D.

## 2010-02-02 NOTE — Letter (Signed)
Summary: FMLA  FMLA   Imported By: Lind Guest 10/16/2009 09:32:30  _____________________________________________________________________  External Attachment:    Type:   Image     Comment:   External Document

## 2010-02-02 NOTE — Progress Notes (Signed)
  Phone Note From Pharmacy   Caller: [facility]* Summary of Call: wants refill on phentermine Initial call taken by: Adella Hare LPN,  November 09, 2009 11:14 AM  Follow-up for Phone Call        one only , needs ov before anymore Follow-up by: Syliva Overman MD,  November 09, 2009 1:05 PM    Prescriptions: PHENTERMINE HCL 37.5 MG CAPS (PHENTERMINE HCL) Take 1 tablet by mouth once a day  #30 x 0   Entered by:   Adella Hare LPN   Authorized by:   Syliva Overman MD   Signed by:   Adella Hare LPN on 55/73/2202   Method used:   Printed then faxed to ...       [facility]* (retail)       [address]       [address] Shipping/mailing       [place], [place]  [address]       Ph: [phone]       Fax: [phone]   RxID:   [id number]

## 2010-02-02 NOTE — Progress Notes (Signed)
Summary: note  Phone Note Call from Patient   Summary of Call: pt needs note from may faxed to her at work [phone] [employer] Initial call taken by: Rudene Anda,  September 28, 2009 9:10 AM  Follow-up for Phone Call        faxed work note from May to number provided Follow-up by: Everitt Amber LPN,  September 28, 2009 9:25 AM

## 2010-02-02 NOTE — Progress Notes (Signed)
  Phone Note Call from Patient   Caller: Patient Summary of Call: wants refill on vimovo, states it helps her carpal tunnel Initial call taken by: Adella Hare LPN,  August 25, 2009 10:36 AM  Follow-up for Phone Call        refill x 1 pls Follow-up by: Syliva Overman MD,  August 25, 2009 11:54 AM    Prescriptions: VIMOVO 500-20 MG TBEC (NAPROXEN-ESOMEPRAZOLE) Take 1 tablet by mouth two times a day for 5 days , then as needed  #60 x 0   Entered by:   Adella Hare LPN   Authorized by:   Syliva Overman MD   Signed by:   Adella Hare LPN on 66/44/0347   Method used:   Electronically to        [facility]* (retail)       [address]       [address] Shipping/mailing       [place], [place]  [address]       Ph: [phone]       Fax: [phone]   RxID:   [id number]

## 2010-02-02 NOTE — Progress Notes (Signed)
Summary: note   Phone Note Call from Patient   Summary of Call: needs to get a note where she left work early over a phone call. please call pt back [phone] fax [phone] crystal pelata Initial call taken by: Rudene Anda,  May 28, 2009 10:35 AM  Follow-up for Phone Call        patient left work on monday due to news reguarding mother. needs a note to cover her that monday. fmla covers all other days  Follow-up by: Adella Hare LPN,  May 28, 2009 11:27 AM  Additional Follow-up for Phone Call Additional follow up Details #1::        pls type and write anote , I will sign requesting the pt be excused from work on the requested day . i will sign, then fax and notify the pt pls.  Additional Follow-up by: Syliva Overman MD,  May 28, 2009 6:57 PM    Additional Follow-up for Phone Call Additional follow up Details #2::    note provided for physician to sign Follow-up by: Adella Hare LPN,  May 29, 2009 10:47 AM

## 2010-02-02 NOTE — Miscellaneous (Signed)
Summary: refill  Clinical Lists Changes  Medications: Rx of NAPROXEN 500 MG  TABS (NAPROXEN) one tab by mouth two times a day as needed for pain;  #60 x 4;  Signed;  Entered by: Worthy Keeler LPN;  Authorized by: Syliva Overman MD;  Method used: Electronically to [facility]*, [address] [place], [place], [place]  [address], Ph: [phone], Fax: [phone]    Prescriptions: NAPROXEN 500 MG  TABS (NAPROXEN) one tab by mouth two times a day as needed for pain  #60 x 4   Entered by:   Worthy Keeler LPN   Authorized by:   Syliva Overman MD   Signed by:   Worthy Keeler LPN on 13/08/6576   Method used:   Electronically to        [facility]* (retail)       [address] [place], [place]  [address]       Ph: [phone]       Fax: [phone]   RxID:   [id number]

## 2010-02-02 NOTE — Letter (Signed)
Summary: Letter  Letter   Imported By: Lind Guest 05/29/2009 11:02:05  _____________________________________________________________________  External Attachment:    Type:   Image     Comment:   External Document

## 2010-02-02 NOTE — Progress Notes (Signed)
Summary: returning call  Phone Note Call from Patient   Summary of Call: call her back at [phone] or cell [phone]  returning call Initial call taken by: Lind Guest,  January 01, 2009 10:05 AM  Follow-up for Phone Call        I did not call this patient. I asked other staff member and they did not either Follow-up by: Everitt Amber,  January 06, 2009 8:28 AM

## 2010-02-02 NOTE — Letter (Signed)
Summary: Unable to Reach, Consult Scheduled  [facility]  [address]   [place], [place] [address]   Phone: [phone]  Fax: [phone]    05/11/2009  Stephanie Cannon [address] [place], [place]  [address] Feb 12, 1970   Dear [name],   We have been unable to reach you by phone.  Please contact our office with an updated phone number.  At the recommendation of DR SIMPSON  we have been asked to schedule you a consult with DR Jena Gauss for RECTAL BLEEDING/HEMORRHOIDS. Please call our office at [phone].     Thank you,    Diana Eves  [facility] R. Roetta Sessions, M.D.    Jonette Eva, M.D. Lorenza Burton, FNP-BC    Tana Coast, PA-C Phone: [phone]    Fax: [phone]

## 2010-02-02 NOTE — Progress Notes (Signed)
Summary: CALL  Phone Note Call from Patient   Summary of Call: CALL HER AT [phone] ABOUT HER APPT WITH DR. GRAMIG HER DECISION BUT WANTS YOU TO CALL HER Initial call taken by: Lind Guest,  September 22, 2009 3:13 PM  Follow-up for Phone Call        patient wants appt with dr Lodema Hong, states she has to reschedule appt with gramig because short staffed at work  directed call to sallie to schedule appt Follow-up by: Adella Hare LPN,  September 22, 2009 3:31 PM

## 2010-02-02 NOTE — Progress Notes (Signed)
Summary: med  Phone Note Call from Patient   Summary of Call: pt is having alot of pain in right hand. she has carpal tunnel. can you cal her in something. [facility]   [phone] Initial call taken by: Rudene Anda,  June 24, 2009 8:26 AM  Follow-up for Phone Call        vimovo, which is naproxe and omeprazole has been printed, she needs to collect this alon with a savings card which will give her free Follow-up by: Adella Hare LPN,  June 24, 2009 11:58 AM  Additional Follow-up for Phone Call Additional follow up Details #1::        called patient- unavailable Additional Follow-up by: Adella Hare LPN,  June 24, 2009 1:59 PM    Additional Follow-up for Phone Call Additional follow up Details #2::    patient aware Follow-up by: Adella Hare LPN,  June 24, 2009 4:35 PM  New/Updated Medications: VIMOVO 500-20 MG TBEC (NAPROXEN-ESOMEPRAZOLE) Take 1 tablet by mouth two times a day for 5 days , then as needed Prescriptions: VIMOVO 500-20 MG TBEC (NAPROXEN-ESOMEPRAZOLE) Take 1 tablet by mouth two times a day for 5 days , then as needed  #60 x 0   Entered and Authorized by:   Syliva Overman MD   Signed by:   Syliva Overman MD on 06/24/2009   Method used:   Print then Give to Patient   RxID:   [id number]

## 2010-02-02 NOTE — Assessment & Plan Note (Signed)
Summary: F UP   Vital Signs:  Patient profile:   40 year old female Menstrual status:  hysterectomy Height:      61 inches Weight:      250.25 pounds BMI:     47.46 O2 Sat:      98 % Pulse rate:   94 / minute Pulse rhythm:   regular Resp:     16 per minute BP sitting:   120 / 90  (left arm)  Vitals Entered By: Everitt Amber LPN (April 27, 2009 1:44 PM)  Nutrition Counseling: Patient's BMI is greater than 25 and therefore counseled on weight management options. CC: The maxzide causes her to have bad dry mouth to the point it causes hoarseness. 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 Had hemorrhoids recently and sometimes when she wipes she has seen some blood, just wanted you to know   Primary Care Provider:  Syliva Overman MD  CC:  The maxzide causes her to have bad dry mouth to the point it causes hoarseness. 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 Had hemorrhoids recently and sometimes when she wipes she has seen some blood and just wanted you to know.  History of Present Illness: Reports  that she has been doing very well. Denies recent fever or chills. Denies sinus pressure, nasal congestion , ear pain or sore throat. Denies chest congestion, or cough productive of sputum. Denies chest pain, palpitations, PND, orthopnea or leg swelling. Denies abdominal pain, nausea, vomitting, diarrhea or constipation. Denies change in bowel movements  Denies dysuria , frequency, incontinence or hesitancy. Denies  joint pain, swelling, or reduced mobility. Denies headaches, vertigo, seizures. Denies depression, anxiety or insomnia. Denies  rash, lesions, or itch. she has been tolerating the phentermine very well, with no adverse side effects and excellent results. She intends to start an exercisae program soon. Nicotine deperndence is unchanged though she still wants to quit     Preventive Screening-Counseling & Management  Alcohol-Tobacco     Smoking Cessation Counseling: yes  Current Medications (verified): 1)  Naproxen 500 Mg  Tabs (Naproxen) .... One Tab By Mouth Two Times A Day As Needed For Pain 2)  Flexeril 10 Mg Tabs (Cyclobenzaprine Hcl) .... Take 1 Tablet By Mouth Two Times A Day As Needed 3)  Maxzide-25 37.5-25 Mg Tabs (Triamterene-Hctz) .... Take 1 Tablet By Mouth Once A Day 4)  Enjuvia 1.25 Mg Tabs (Estrogens Conj Synthetic B) .... One Tab By Mouth Qd 5)  Alprazolam 1 Mg Tabs (Alprazolam) .... Take 1 Tab By Mouth At Bedtime As Needed 6)  Nexium 40 Mg Cpdr (Esomeprazole Magnesium) .... One Cap By Mouth Once Daily 7)  Phentermine Hcl 37.5 Mg Tabs (Phentermine Hcl) .... Take 1 Tablet By Mouth Once A Day  Allergies (verified): 1)  ! Codeine 2)  ! Morphine  Review of Systems      See HPI Eyes:  Denies blurring and discharge. GI:  Complains of bloody stools; c/o rectal bleeding which she believes is from hemmorhoiuds , howevere her sibling had a rectal polyp and she is concerned. Endo:  Denies cold intolerance, excessive hunger, excessive thirst, excessive urination, heat intolerance, polyuria, and weight change. Heme:  Denies abnormal bruising and bleeding. Allergy:  Denies hives or rash and itching eyes.  Physical Exam  General:  Well-developed,well-nourished,in no acute distress; alert,appropriate  and cooperative throughout examination HEENT: No facial asymmetry,  EOMI,no sinus tenderness, TM' clear, oropharynx  pink and moist.   Chest:clear to ascultation CVS: S1, S2, No murmurs, No S3.   Abd: Soft, Nontender.  MS: Adequate ROM spine, hips, shoulders and knees.  Ext: No edema.   CNS: CN 2-12 intact, power tone and sensation normal throughout.   Skin: Intact, no visible lesions or rashes.  Psych: Good eye contact, normal affect.  Memory intact, not anxious or depressed appearing.    Impression & Recommendations:  Problem # 1:   RECTAL BLEEDING (ICD-569.3) Assessment Comment Only  Orders: Gastroenterology Referral (GI)  Problem # 2:  NICOTINE ADDICTION (ICD-305.1) Assessment: Unchanged  Encouraged smoking cessation and discussed different methods for smoking cessation.   Problem # 3:  OBESITY (ICD-278.00) Assessment: Improved  Ht: 61 (04/27/2009)   Wt: 250.25 (04/27/2009)   BMI: 47.46 (04/27/2009)  Problem # 4:  HYPERTENSION (ICD-401.9) Assessment: Deteriorated  The following medications were removed from the medication list:    Maxzide-25 37.5-25 Mg Tabs (Triamterene-hctz) .Marland Kitchen... Take 1 tablet by mouth once a day, intolerant of maxzide and ants it changed Her updated medication list for this problem includes:    Amlodipine Besylate 5 Mg Tabs (Amlodipine besylate) .Marland Kitchen... Take 1 tablet by mouth once a day  BP today: 120/90 Prior BP: 120/80 (02/10/2009)  Labs Reviewed: K+: 3.4 (04/04/2009) Creat: : 0.92 (04/04/2009)   Chol: 212 (04/04/2009)   HDL: 40 (04/04/2009)   LDL: 154 (04/04/2009)   TG: 92 (04/04/2009)  Complete Medication List: 1)  Naproxen 500 Mg Tabs (Naproxen) .... One tab by mouth two times a day as needed for pain 2)  Flexeril 10 Mg Tabs (Cyclobenzaprine hcl) .... Take 1 tablet by mouth two times a day as needed 3)  Enjuvia 1.25 Mg Tabs (Estrogens conj synthetic b) .... One tab by mouth qd 4)  Alprazolam 1 Mg Tabs (Alprazolam) .... Take 1 tab by mouth at bedtime as needed 5)  Nexium 40 Mg Cpdr (Esomeprazole magnesium) .... One cap by mouth once daily 6)  Phentermine Hcl 37.5 Mg Tabs (Phentermine hcl) .... Take 1 tablet by mouth once a day 7)  Proctocort 30 Mg Supp (Hydrocortisone acetate) .... Apply intrarectally every 4 to 6 hours as needed 8)  Amlodipine Besylate 5 Mg Tabs (Amlodipine besylate) .... Take 1 tablet by mouth once a day  Patient Instructions: 1)  Please schedule a follow-up appointment in 2 months. 2)  It is important that you exercise regularly at least 40 minutes 5 times a  week. If you develop chest pain, have severe difficulty breathing, or feel very tired , stop exercising immediately and seek medical attention. 3)  You need to lose weight. Consider a lower calorie diet and regular exercise.  4)  Tobacco is very bad for your health and your loved ones! You Should stop smoking!. 5)  Stop Smoking Tips: Choose a Quit date. Cut down before the Quit date. decide what you will do as a substitute when you feel the urge to smoke(gum,toothpick,exercise). 6)  pLS start a food diary  and weight on the same scale every 2 weeks, goal of 2 to3 pounds 7)  Call for zyrtec or claritin and we will provide nasal flushes . 8)  You will be referred to Dr Darrick Penna for eval of rectal bleeding Prescriptions: AMLODIPINE BESYLATE 5 MG TABS (AMLODIPINE BESYLATE) Take 1 tablet by mouth once a day  #90 x 1   Entered and Authorized by:  Syliva Overman MD   Signed by:   Syliva Overman MD on 04/27/2009   Method used:   Printed then faxed to ...       [facility]* (retail)       [address]       [address] Shipping/mailing       [place], [place]  [address]       Ph: [phone]       Fax: [phone]   RxID:   [id number] PROCTOCORT 30 MG SUPP (HYDROCORTISONE ACETATE) apply intrarectally every 4 to 6 hours as needed  #40 x 2   Entered and Authorized by:   Syliva Overman MD   Signed by:   Syliva Overman MD on 04/27/2009   Method used:   Electronically to        [facility]* (retail)       [address]       [address] Shipping/mailing       [place], [place]  [address]       Ph: [phone]       Fax: [phone]   RxID:   [id number]

## 2010-02-02 NOTE — Progress Notes (Signed)
Summary: wants to speak with doc  Phone Note From Pharmacy   Summary of Call: pt wants to speak with doc about her mother Rico Ala [phone]   Initial call taken by: Rudene Anda,  May 18, 2009 11:09 AM Summary of Call: i spoke directly with pt who gave verbal consent nd spoke with her daughter yesterday Initial call taken by: Syliva Overman MD,  May 19, 2009 10:35 PM  Follow-up for Phone Call        pls recommend to Rhema we need written notification o file from [facility] before i can do this, pls check and see i47f we have this alread on file Follow-up by: Syliva Overman MD,  May 18, 2009 4:24 PM

## 2010-02-02 NOTE — Letter (Signed)
Summary: Out of Work  [facility]  [address]   [place], [place] [address]   Phone: [phone]  Fax: [phone]    February 10, 2009   Employee:  LULU HIRSCHMANN    To Whom It May Concern:   For Medical reasons, please excuse the above named employee from work for the following dates:  Start:   02/10/09  End:   02/11/09 to return with no restrictions  If you need additional information, please feel free to contact our office.         Sincerely,    Milus Mallick. Lodema Hong, MD

## 2010-02-02 NOTE — Progress Notes (Signed)
Summary: call  Phone Note Call from Patient   Summary of Call: left message to cancel her appt. and for sallie to call back at [phone] phones will not be cut back on till 2:00 Initial call taken by: Lind Guest,  March 26, 2009 1:01 PM  Follow-up for Phone Call        called pt back and cancelled her appt Follow-up by: Rudene Anda,  March 26, 2009 2:07 PM

## 2010-02-02 NOTE — Progress Notes (Signed)
Summary: CALL  Phone Note Call from Patient   Summary of Call: WANTS SOMEONE TO CALL HER AT CELL # [phone] OR WORK # [phone] Initial call taken by: Lind Guest,  March 31, 2009 10:41 AM  Follow-up for Phone Call        returned call, left message Follow-up by: Adella Hare LPN,  March 31, 2009 10:48 AM  Additional Follow-up for Phone Call Additional follow up Details #1::        states the blood pressure med is causing severe dry mouth, didnt have this problem on her other med Additional Follow-up by: Adella Hare LPN,  March 31, 2009 10:53 AM    Additional Follow-up for Phone Call Additional follow up Details #2::    phentermine is the more likely reason, I need to see her bloodwork to ensure not diabetic also, no change without this and ov Follow-up by: Syliva Overman MD,  March 31, 2009 8:32 PM  Additional Follow-up for Phone Call Additional follow up Details #3:: Details for Additional Follow-up Action Taken: patient aware Additional Follow-up by: Adella Hare LPN,  April 01, 2009 10:09 AM

## 2010-02-02 NOTE — Progress Notes (Signed)
Summary: rx   Phone Note Call from Patient   Summary of Call: [facility]. pt needs to get her nexium, phentermine and all other meds sent there. realy needs nexium and pherntmine [phone] ext [phone] Initial call taken by: Rudene Anda,  February 17, 2009 1:57 PM  Follow-up for Phone Call        may i refill phentermine? also nexium is not on med list but is being requested. Follow-up by: Lilyan Gilford LPN,  February 17, 2009 2:58 PM  Additional Follow-up for Phone Call Additional follow up Details #1::        erx nexium 40mg  Take 1 capsule by mouth once a day #90 refill 3 and phentermine 37.5mg  tablet one daily #30 zero refill pls to [facility]C out pt and let her know Additional Follow-up by: Syliva Overman MD,  February 17, 2009 3:45 PM    Additional Follow-up for Phone Call Additional follow up Details #2::    rx sent to [facility]  patient aware Follow-up by: Adella Hare LPN,  February 17, 2009 4:18 PM  New/Updated Medications: PHENTERMINE HCL 37.5 MG TABS (PHENTERMINE HCL) Take 1 tablet by mouth once a day NEXIUM 40 MG CPDR (ESOMEPRAZOLE MAGNESIUM) one cap by mouth once daily Prescriptions: PHENTERMINE HCL 37.5 MG TABS (PHENTERMINE HCL) Take 1 tablet by mouth once a day  #30 x 0   Entered by:   Adella Hare LPN   Authorized by:   Syliva Overman MD   Signed by:   Adella Hare LPN on 45/40/9811   Method used:   Printed then faxed to ...       [facility]* (retail)       [address]       [address] Shipping/mailing       [place], [place]  [address]       Ph: [phone]       Fax: [phone]   RxID:   [id number] NEXIUM 40 MG CPDR (ESOMEPRAZOLE MAGNESIUM) one cap by mouth once daily  #90 x 0   Entered by:   Adella Hare LPN   Authorized by:   Syliva Overman MD   Signed by:   Adella Hare LPN on 66/44/0347   Method used:   Electronically to        [facility]* (retail)       [address]       [address]  Shipping/mailing       [place], [place]  [address]       Ph: [phone]       Fax: [phone]   RxID:   [id number]

## 2010-02-02 NOTE — Assessment & Plan Note (Signed)
Summary: office visit   Vital Signs:  Patient profile:   40 year old female Menstrual status:  hysterectomy Height:      61 inches Weight:      269 pounds BMI:     51.01 O2 Sat:      96 % Pulse rate:   96 / minute Pulse rhythm:   regular Resp:     16 per minute BP sitting:   128 / 90  (left arm) Cuff size:   xl   Vitals Entered By: Everitt Amber LPN (September 25, 2009 8:02 AM)  Nutrition Counseling: Patient's BMI is greater than 25 and therefore counseled on weight management options. CC: Follow up chronic problems   Primary Care Provider:  Syliva Overman MD  CC:  Follow up chronic problems.  History of Present Illness: Reports  that  she not been doing well. She reports uncontrolled right hand pain with weaknes, numbness and tingling, this is waking her at night, she states she is ready to have her carpal tunnel surgery done. She has been faithfuklly walking , but her eating habits are unchanged, and she has gained weight with increased back pain. She remains nicotine free. Denies recent fever or chills. Denies sinus pressure, nasal congestion , ear pain or sore throat. Denies chest congestion, or cough productive of sputum. Denies chest pain, palpitations, PND, orthopnea or leg swelling. Denies abdominal pain, nausea, vomitting, diarrhea or constipation. Denies change in bowel movements or bloody stool. Denies dysuria , frequency, incontinence or hesitancy.  Denies headaches, vertigo, seizures. Denies depression, anxiety or insomnia. Denies  rash, lesions, or itch.     Current Medications (verified): 1)  Naproxen 500 Mg  Tabs (Naproxen) .... One Tab By Mouth Two Times A Day As Needed For Pain 2)  Flexeril 10 Mg Tabs (Cyclobenzaprine Hcl) .... Take 1 Tablet By Mouth Two Times A Day As Needed 3)  Enjuvia 1.25 Mg Tabs (Estrogens Conj Synthetic B) .... One Tab By Mouth Qd 4)  Alprazolam 1 Mg Tabs (Alprazolam) .... Take 1 Tab By Mouth At Bedtime As Needed 5)  Nexium 40  Mg Cpdr (Esomeprazole Magnesium) .... One Cap By Mouth Once Daily 6)  Amlodipine Besylate 5 Mg Tabs (Amlodipine Besylate) .... Take 1 Tablet By Mouth Once A Day 7)  Vimovo 500-20 Mg Tbec (Naproxen-Esomeprazole) .... Take 1 Tablet By Mouth Two Times A Day For 5 Days , Then As Needed 8)  Phentermine Hcl 37.5 Mg Caps (Phentermine Hcl) .... Take 1 Tablet By Mouth Once A Day  Allergies (verified): 1)  ! Codeine 2)  ! Morphine  Review of Systems      See HPI General:  Complains of fatigue and sleep disorder. Eyes:  Denies blurring and discharge. MS:  Complains of joint pain, low back pain, mid back pain, and muscle weakness; right wrist and hand pain and weakness getting worse. Endo:  Denies excessive thirst and excessive urination. Heme:  Denies abnormal bruising and bleeding. Allergy:  Denies hives or rash and itching eyes.  Physical Exam  General:  Well-developed,obese,in no acute distress; alert,appropriate and cooperative throughout examination HEENT: No facial asymmetry,  EOMI, No sinus tenderness, TM's Clear, oropharynx  pink and moist.   Chest: Clear to auscultation bilaterally.  CVS: S1, S2, No murmurs, No S3.   Abd: Soft, Nontender.  MS: decreased  ROM spine,adequate in  hips, shoulders and knees. pt has a weak right grip Ext: No edema.   CNS: CN 2-12 intact, power tone and sensation normal  throughout.   Skin: Intact, no visible lesions or rashes.  Psych: Good eye contact, normal affect.  Memory intact, not anxious or depressed appearing.    Impression & Recommendations:  Problem # 1:  HYPERTENSION (ICD-401.9) Assessment Improved  Her updated medication list for this problem includes:    Amlodipine Besylate 5 Mg Tabs (Amlodipine besylate) .Marland Kitchen... Take 1 tablet by mouth once a day Check your Blood Pressure regularly. If it is above150/95  you should make an appointment.  BP today: 128/90 Prior BP: 140/86 (07/02/2009)  Labs Reviewed: K+: 3.4 (04/04/2009) Creat: : 0.92  (04/04/2009)   Chol: 212 (04/04/2009)   HDL: 40 (04/04/2009)   LDL: 154 (04/04/2009)   TG: 92 (04/04/2009)  Problem # 2:  FATIGUE (ICD-780.79) Assessment: Deteriorated  Problem # 3:  OBESITY (ICD-278.00) Assessment: Deteriorated  Ht: 61 (09/25/2009)   Wt: 269 (09/25/2009)   BMI: 51.01 (09/25/2009) therapeutic lifestyle change discussed and encouraged  Problem # 4:  CARPAL TUNNEL SYNDROME (ICD-354.0) Assessment: Deteriorated  Orders: Orthopedic Referral (Ortho)  Problem # 5:  BACK PAIN, CHRONIC (ICD-724.5) Assessment: Deteriorated  Her updated medication list for this problem includes:    Naproxen 500 Mg Tabs (Naproxen) ..... One tab by mouth two times a day as needed for pain    Flexeril 10 Mg Tabs (Cyclobenzaprine hcl) .Marland Kitchen... Take 1 tablet by mouth two times a day as needed    Vimovo 500-20 Mg Tbec (Naproxen-esomeprazole) .Marland Kitchen... Take 1 tablet by mouth two times a day for 5 days , then as needed  Orders: Ketorolac-Toradol 15mg  (Z6109) Admin of Therapeutic Inj  intramuscular or subcutaneous (60454)  Complete Medication List: 1)  Naproxen 500 Mg Tabs (Naproxen) .... One tab by mouth two times a day as needed for pain 2)  Flexeril 10 Mg Tabs (Cyclobenzaprine hcl) .... Take 1 tablet by mouth two times a day as needed 3)  Enjuvia 1.25 Mg Tabs (Estrogens conj synthetic b) .... One tab by mouth qd 4)  Alprazolam 1 Mg Tabs (Alprazolam) .... Take 1 tab by mouth at bedtime as needed 5)  Nexium 40 Mg Cpdr (Esomeprazole magnesium) .... One cap by mouth once daily 6)  Amlodipine Besylate 5 Mg Tabs (Amlodipine besylate) .... Take 1 tablet by mouth once a day 7)  Vimovo 500-20 Mg Tbec (Naproxen-esomeprazole) .... Take 1 tablet by mouth two times a day for 5 days , then as needed 8)  Phentermine Hcl 37.5 Mg Caps (Phentermine hcl) .... Take 1 tablet by mouth once a day  Patient Instructions: 1)  Please schedule a follow-up appointment in 2 months. 2)  It is important that you exercise regularly  at least 20 minutes 5 times a week. If you develop chest pain, have severe difficulty breathing, or feel very tired , stop exercising immediately and seek medical attention. 3)  You need to lose weight. Consider a lower calorie diet and regular exercise.  4)  You will be referred to Dr Amanda Pea about the right hand. 5)  You will get injections today for your back 6)  The medication list was reviewed and reconciled..All changed/newly prescribed medications were explained. A complete medication list was provided to the patient/caregiver.  7)  Pt advised against taking vimovo and naproxen concurrently   Medication Administration  Injection # 1:    Medication: Depo- Medrol 80mg     Diagnosis: BACK PAIN, CHRONIC (ICD-724.5)    Route: IM    Site: RUOQ gluteus    Exp Date: 05/2010    Lot #: [id number] [id number]  Mfr: Pharmacia    Comments: 80mg  given     Patient tolerated injection without complications    Given by: Everitt Amber LPN (September 25, 2009 8:50 AM)  Injection # 2:    Medication: Ketorolac-Toradol 15mg     Diagnosis: BACK PAIN, CHRONIC (ICD-724.5)    Route: IM    Site: RUOQ gluteus    Exp Date: 03/2011    Lot #: [id number]     Mfr: [email or web address]    Comments: 60mg  given     Patient tolerated injection without complications    Given by: Everitt Amber LPN (September 25, 2009 8:51 AM)  Orders Added: 1)  Est. Patient Level IV [81191] 2)  Ketorolac-Toradol 15mg  [J1885] 3)  Admin of Therapeutic Inj  intramuscular or subcutaneous [96372] 4)  Orthopedic Referral [Ortho]

## 2010-02-04 NOTE — Progress Notes (Signed)
Summary: WHEEZING  Phone Note Call from Patient   Summary of Call: WHEEZING CHEST AND COLD CONG. SEND TO [facility] SHE HAS NO COPAY CALL BACK AT WORK AT [phone] TO LET HER KNOW Initial call taken by: Lind Guest,  December 22, 2009 8:51 AM  Follow-up for Phone Call        prescriptions sent to [facility] pls let her know Follow-up by: Syliva Overman MD,  December 22, 2009 5:04 PM  Additional Follow-up for Phone Call Additional follow up Details #1::        CALLED PATIENT, NO ANSWER Additional Follow-up by: Adella Hare LPN,  December 22, 2009 6:06 PM    Additional Follow-up for Phone Call Additional follow up Details #2::    PATIENT AWARE  THAT MEDICINE IS CALLED IN Follow-up by: Lind Guest,  December 23, 2009 10:02 AM  New/Updated Medications: TESSALON PERLES 100 MG CAPS (BENZONATATE) Take 1 capsule by mouth three times a day PREDNISONE (PAK) 5 MG TABS (PREDNISONE) Use as directed PROVENTIL HFA 108 (90 BASE) MCG/ACT AERS (ALBUTEROL SULFATE) 2 puffs every 6 to 8 hours as needed for wheezing Prescriptions: PROVENTIL HFA 108 (90 BASE) MCG/ACT AERS (ALBUTEROL SULFATE) 2 puffs every 6 to 8 hours as needed for wheezing  #1 x 0   Entered and Authorized by:   Syliva Overman MD   Signed by:   Syliva Overman MD on 12/22/2009   Method used:   Electronically to        [facility]* (retail)       [address] [place], [place]  [address]       Ph: [phone]       Fax: [phone]   RxID:   [id number] PREDNISONE (PAK) 5 MG TABS (PREDNISONE) Use as directed  #21 x 0   Entered and Authorized by:   Syliva Overman MD   Signed by:   Syliva Overman MD on 12/22/2009   Method used:   Electronically to        [facility]* (retail)       [address]       [place], [place]  [address]       Ph: [phone]       Fax: [phone]   RxID:   [id number] TESSALON PERLES 100 MG CAPS (BENZONATATE)  Take 1 capsule by mouth three times a day  #30 x 0   Entered and Authorized by:   Syliva Overman MD   Signed by:   Syliva Overman MD on 12/22/2009   Method used:   Electronically to        [facility]* (retail)       [address]       [place], [place]  [address]       Ph: [phone]       Fax: [phone]   RxID:   [id number]   Appended Document: WHEEZING patient aware

## 2010-02-04 NOTE — Progress Notes (Signed)
  Phone Note Call from Patient   Caller: Patient Summary of Call: patient called in and states her back is hurting her, states she doesnt go anywhere due to the pain, has appt with ortho this month, states the pain was so bad that she took someone else pain medication, advised patient i am unable to prescribe medication and doctor out of office until monday, advised patient er or urgent care, patient did not seem happy with that and advised me she would have her mother call dr Lodema Hong at home, advised her that i am sorry but dr Caera Enwright is on vacation this week.  Initial call taken by: Adella Hare LPN,  January 05, 2010 1:18 PM  Follow-up for Phone Call        noted  Follow-up by: Syliva Overman MD,  January 11, 2010 12:34 PM

## 2010-02-04 NOTE — Progress Notes (Signed)
Summary: discount card  Phone Note Call from Patient   Summary of Call: pt would like to see if we have a discount card for vimovo.  [phone] [facility] doc gave her one awhile back.  Initial call taken by: Rudene Anda,  January 21, 2010 3:03 PM  Follow-up for Phone Call        card is available and patient aware Follow-up by: Adella Hare LPN,  January 22, 2010 2:57 PM

## 2010-02-12 ENCOUNTER — Telehealth: Payer: Self-pay | Admitting: Family Medicine

## 2010-02-22 ENCOUNTER — Encounter: Payer: Self-pay | Admitting: Family Medicine

## 2010-02-22 ENCOUNTER — Telehealth: Payer: Self-pay | Admitting: Family Medicine

## 2010-02-22 ENCOUNTER — Ambulatory Visit (INDEPENDENT_AMBULATORY_CARE_PROVIDER_SITE_OTHER): Payer: Commercial Managed Care - PPO | Admitting: Family Medicine

## 2010-02-22 DIAGNOSIS — H109 Unspecified conjunctivitis: Secondary | ICD-10-CM

## 2010-02-22 DIAGNOSIS — E669 Obesity, unspecified: Secondary | ICD-10-CM

## 2010-02-22 DIAGNOSIS — L0203 Carbuncle of face: Secondary | ICD-10-CM

## 2010-02-22 DIAGNOSIS — L0202 Furuncle of face: Secondary | ICD-10-CM | POA: Insufficient documentation

## 2010-02-22 DIAGNOSIS — I10 Essential (primary) hypertension: Secondary | ICD-10-CM

## 2010-02-24 NOTE — Progress Notes (Signed)
Summary: refill  Phone Note Call from Patient   Summary of Call: [facility] needs a refill on pentermine.  Initial call taken by: Rudene Anda,  February 12, 2010 11:01 AM  Follow-up for Phone Call        this cannot be refilled wiothout office re-eval, she was last in the office5 months ago Follow-up by: Syliva Overman MD,  February 12, 2010 11:51 AM  Additional Follow-up for Phone Call Additional follow up Details #1::        called patient, left message Additional Follow-up by: Adella Hare LPN,  February 12, 2010 1:18 PM    Additional Follow-up for Phone Call Additional follow up Details #2::    patient aware Follow-up by: Adella Hare LPN,  February 15, 2010 8:37 AM

## 2010-02-26 ENCOUNTER — Encounter: Payer: Self-pay | Admitting: Family Medicine

## 2010-03-01 ENCOUNTER — Telehealth: Payer: Self-pay | Admitting: Family Medicine

## 2010-03-02 NOTE — Letter (Signed)
Summary: [facility]  [facility]   Imported By: Lind Guest 02/26/2010 11:24:40  _____________________________________________________________________  External Attachment:    Type:   Image     Comment:   External Document

## 2010-03-02 NOTE — Assessment & Plan Note (Signed)
Summary: office visit   Vital Signs:  Patient profile:   40 year old female Menstrual status:  hysterectomy Height:      61 inches Weight:      274.25 pounds BMI:     52.01 O2 Sat:      95 % Pulse rate:   74 / minute Pulse rhythm:   regular Resp:     16 per minute BP sitting:   120 / 74  (left arm) Cuff size:   xl  Vitals Entered By: Everitt Amber LPN (February 22, 2010 10:58 AM)  Nutrition Counseling: Patient's BMI is greater than 25 and therefore counseled on weight management options. CC: 3 days, eyes matted together and red/itchy   Primary Care Provider:  Syliva Overman MD  CC:  3 days and eyes matted together and red/itchy.  History of Present Illness: Pt has been sick since last week with head congestion and green nasal drainage , she subsequently developed  a sore in her right nostril this past weekend and an irritated left eye.she has had no fevr or chills. 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 She has concerns about continued weight gain and chronic back pain and plans to workon these  Current Medications (verified): 1)  Naproxen 500 Mg  Tabs (Naproxen) .... One Tab By Mouth Two Times A Day As Needed For Pain 2)  Flexeril 10 Mg Tabs (Cyclobenzaprine Hcl) .... Take 1 Tablet By Mouth Two Times A Day As Needed 3)  Enjuvia 1.25 Mg Tabs (Estrogens Conj Synthetic B) .... One Tab By Mouth Qd 4)  Alprazolam 1 Mg Tabs (Alprazolam) .... Take 1 Tab By Mouth At Bedtime As Needed 5)  Nexium 40 Mg Cpdr (Esomeprazole Magnesium) .... One Cap By Mouth Once Daily 6)  Amlodipine Besylate 5 Mg Tabs (Amlodipine Besylate) .... Take 1 Tablet By Mouth Once A Day 7)  Vimovo 500-20 Mg Tbec (Naproxen-Esomeprazole) .... Take 1 Tablet By Mouth Two Times A Day For 5 Days , Then As Needed 8)  Phentermine Hcl 37.5 Mg Caps (Phentermine Hcl) .... Take 1 Tablet By Mouth Once A Day 9)  Hydrocortisone-Acetic Acid 1-2 % Soln (Hydrocortisone-Acetic Acid) .... 5 Drops in  Affected Ear Two Times A Day  Allergies (verified): 1)  ! Codeine 2)  ! Morphine  Past History:  Past medical, surgical, family and social histories (including risk factors) reviewed, and no changes noted (except as noted below).  Past Medical History: Reviewed history from 06/26/2007 and no changes required. Current Problems:  GERD (ICD-530.81) CARPAL TUNNEL SYNDROME (ICD-354.0) BACK PAIN, CHRONIC (ICD-724.5) OBESITY (ICD-278.00) HYPERTENSION (ICD-401.9)  Past Surgical History: Reviewed history from 03/25/2008 and no changes required. Partial hysterectomy Caesarean section Tonsillectomy BSO Appendectomy cholecystectomy 2010  Family History: Reviewed history from 06/26/2007 and no changes required. Mother living - HTN, obesity Father - hypothyroid, hyperliidemia One brother living - healthy  Social History: Reviewed history from 07/02/2009 and no changes required. Employed remarried to former spouse One child Quit nicotine in 2011 Alcohol use-no Drug use-no  Review of Systems      See HPI General:  Complains of fatigue. Eyes:  Complains of eye irritation, eye pain, red eye, and vision loss-both eyes. MS:  Complains of low back pain and mid back pain. Derm:  Complains of lesion(s). Psych:  Denies anxiety and depression. Endo:  Denies cold intolerance, excessive hunger, excessive thirst, excessive urination, and heat intolerance. Heme:  Denies abnormal bruising and bleeding. Allergy:  Denies  hives or rash and itching eyes.  Physical Exam  General:  Well-developed,well-nourished,in no acute distress; alert,appropriate and cooperative throughout examination HEENT: No facial asymmetry,  EOMI, No sinus tenderness, TM's Clear, oropharynx  pink and moist. right conjunctival erythema  Chest: Clear to auscultation bilaterally.  CVS: S1, S2, No murmurs, No S3.   Abd: Soft, Nontender.  MS: decreased  ROM spine,adequate in  hips, shoulders and knees.  Ext: No edema.    CNS: CN 2-12 intact, power tone and sensation normal throughout.   Skin: furuncle with cellulitis right nostril Psych: Good eye contact, normal affect.  Memory intact, not anxious or depressed appearing.    Impression & Recommendations:  Problem # 1:  CONJUNCTIVITIS, LEFT (ICD-372.30) Assessment Comment Only pt advised to keep her fingers away from the eye, nd keep the area clean , she has been prescribed antibiotics for a furuncle i her right nostril  Problem # 2:  BACK PAIN, CHRONIC (ICD-724.5) Assessment: Unchanged  Her updated medication list for this problem includes:    Naproxen 500 Mg Tabs (Naproxen) ..... One tab by mouth two times a day as needed for pain    Flexeril 10 Mg Tabs (Cyclobenzaprine hcl) .Marland Kitchen... Take 1 tablet by mouth two times a day as needed    Vimovo 500-20 Mg Tbec (Naproxen-esomeprazole) .Marland Kitchen... Take 1 tablet by mouth two times a day for 5 days , then as needed willwork on weight loss to assist with pain, and exercise,no med seemsto really help  Problem # 3:  OBESITY (ICD-278.00) Assessment: Deteriorated  Orders: T-Lipid Profile ([id number])  Ht: 61 (02/22/2010)   Wt: 274.25 (02/22/2010)   BMI: 52.01 (02/22/2010) therapeutic lifestyle change discussed and encouraged  Problem # 4:  HYPERTENSION (ICD-401.9) Assessment: Improved  Her updated medication list for this problem includes:    Amlodipine Besylate 5 Mg Tabs (Amlodipine besylate) .Marland Kitchen... Take 1 tablet by mouth once a day  Orders: T-Basic Metabolic Panel [phone])  BP today: 120/74 Prior BP: 128/90 (09/25/2009)  Labs Reviewed: K+: 3.4 (04/04/2009) Creat: : 0.92 (04/04/2009)   Chol: 212 (04/04/2009)   HDL: 40 (04/04/2009)   LDL: 154 (04/04/2009)   TG: 92 (04/04/2009)  Problem # 5:  FURUNCLE, FACE (ICD-680.0) Assessment: Comment Only doxycycline and bactroban prescribed, good hygiene discussed  Complete Medication List: 1)  Naproxen 500 Mg Tabs (Naproxen) .... One tab by mouth two times a  day as needed for pain 2)  Flexeril 10 Mg Tabs (Cyclobenzaprine hcl) .... Take 1 tablet by mouth two times a day as needed 3)  Enjuvia 1.25 Mg Tabs (Estrogens conj synthetic b) .... One tab by mouth qd 4)  Alprazolam 1 Mg Tabs (Alprazolam) .... Take 1 tab by mouth at bedtime as needed 5)  Nexium 40 Mg Cpdr (Esomeprazole magnesium) .... One cap by mouth once daily 6)  Amlodipine Besylate 5 Mg Tabs (Amlodipine besylate) .... Take 1 tablet by mouth once a day 7)  Vimovo 500-20 Mg Tbec (Naproxen-esomeprazole) .... Take 1 tablet by mouth two times a day for 5 days , then as needed 8)  Phentermine Hcl 37.5 Mg Caps (Phentermine hcl) .... Take 1 tablet by mouth once a day 9)  Hydrocortisone-acetic Acid 1-2 % Soln (Hydrocortisone-acetic acid) .... 5 drops in affected ear two times a day 10)  Septra Ds 800-160 Mg Tabs (Sulfamethoxazole-trimethoprim) .... Take 1 tablet by mouth two times a day 11)  Fluconazole 150 Mg Tabs (Fluconazole) .... Take 1 tablet by mouth once a day as needed for vaginal itch  12)  Mupirocin 2 % Oint (Mupirocin) .... Apply to lesion in right nostril twice daily for 5 days , then as needed  Other Orders: T-CBC w/Diff [phone]) T- Hemoglobin A1C ([id number]) T-TSH [phone]) T-Vitamin D (25-Hydroxy) (69629-52841)  Patient Instructions: 1)  Please schedule a follow-up appointment in 4 months. 2)  It is important that you exercise regularly at least 30 minutes 5 times a week. If you develop chest pain, have severe difficulty breathing, or feel very tired , stop exercising immediately and seek medical attention. 3)  You need to lose weight. Consider a lower calorie diet and regular exercise.  4)  BMP prior to visit, ICD-9: 5)  Lipid Panel prior to visit, ICD-9:  fasting April 2 or after 6)  CBC w/ Diff prior to visit, ICD-9, hBA1C, Vit D, TSH 7)  You are being trated for left comjunctivittis and furunculosis of th e righ nostril. 8)  Work excuse today to return  tomorrow. Prescriptions: MUPIROCIN 2 % OINT (MUPIROCIN) apply to lesion in right nostril twice daily for 5 days , then as needed  #45 gm x 1   Entered by:   Everitt Amber LPN   Authorized by:   Syliva Overman MD   Signed by:   Everitt Amber LPN on 32/44/0102   Method used:   Electronically to        [facility]* (retail)       [address]       [address] Shipping/mailing       [place], [place]  [address]       Ph: [phone]       Fax: [phone]   RxID:   [id number] FLUCONAZOLE 150 MG TABS (FLUCONAZOLE) Take 1 tablet by mouth once a day as needed for vaginal itch  #3 x 0   Entered by:   Everitt Amber LPN   Authorized by:   Syliva Overman MD   Signed by:   Everitt Amber LPN on 60/10/9321   Method used:   Electronically to        [facility]* (retail)       [address] [place], [place]  [address]       Ph: [phone]       Fax: [phone]   RxID:   [id number] SEPTRA DS 800-160 MG TABS (SULFAMETHOXAZOLE-TRIMETHOPRIM) Take 1 tablet by mouth two times a day  #20 x 0   Entered by:   Everitt Amber LPN   Authorized by:   Syliva Overman MD   Signed by:   Everitt Amber LPN on 46/27/0350   Method used:   Electronically to        [facility]* (retail)       [address] [place], [place]  [address]       Ph: [phone]       Fax: [phone]   RxID:   [id number]    Orders Added: 1)  Est. Patient Level IV [61443] 2)  T-Basic Metabolic Panel [15400-86761] 3)  T-Lipid Profile [80061-22930] 4)  T-CBC w/Diff [95093-26712] 5)  T- Hemoglobin A1C [83036-23375] 6)  T-TSH [45809-98338] 7)  T-Vitamin D (25-Hydroxy) [25053-97673]

## 2010-03-02 NOTE — Progress Notes (Signed)
Summary: medicine  Phone Note Call from Patient   Summary of Call: needs the medicine that was sent to [facility] pharmacy patients wants you to call and cancel that. Then please send to [place] [facility]. Initial call taken by: Lind Guest,  February 22, 2010 1:50 PM    Prescriptions: MUPIROCIN 2 % OINT (MUPIROCIN) apply to lesion in right nostril twice daily for 5 days , then as needed  #45 gm x 1   Entered by:   Adella Hare LPN   Authorized by:   Syliva Overman MD   Signed by:   Adella Hare LPN on 16/10/9602   Method used:   Electronically to        [facility]* (retail)       [address] [place], [place]  [address]       Ph: [phone]       Fax: [phone]   RxID:   [id number]  cancelled at [facility] and sent to [facility] as requested

## 2010-03-02 NOTE — Letter (Signed)
Summary: Out of Work  [facility]  [address]   [place], [place] [address]   Phone: [phone]  Fax: [phone]    February 22, 2010   Employee:  INNOCENCE SCHLOTZHAUER    To Whom It May Concern:   For Medical reasons, please excuse the above named employee from work for the following dates:  Start:   02/22/10    End:   02/23/10 to return with no restrictions  If you need additional information, please feel free to contact our office.         Sincerely,    Milus Mallick. Lodema Hong, MD

## 2010-03-08 ENCOUNTER — Ambulatory Visit: Payer: Self-pay | Admitting: Family Medicine

## 2010-03-11 NOTE — Progress Notes (Signed)
Summary: refill  Phone Note Call from Patient   Summary of Call: pt needs to get a refill on phentermine. [facility]  Initial call taken by: Rudene Anda,  March 01, 2010 11:32 AM    Prescriptions: PHENTERMINE HCL 37.5 MG CAPS (PHENTERMINE HCL) Take 1 tablet by mouth once a day  #30 x 0   Entered by:   Everitt Amber LPN   Authorized by:   Syliva Overman MD   Signed by:   Everitt Amber LPN on 16/10/9602   Method used:   Printed then faxed to ...       [facility]* (retail)       [address]       [place], [place]  [address]       Ph: [phone]       Fax: [phone]   RxID:   [id number] PHENTERMINE HCL 37.5 MG CAPS (PHENTERMINE HCL) Take 1 tablet by mouth once a day  #30 x 0   Entered by:   Everitt Amber LPN   Authorized by:   Syliva Overman MD   Signed by:   Everitt Amber LPN on 10/30/2534   Method used:   Printed then faxed to ...       [facility]* (retail)       [address]       [place], [place]  [address]       Ph: [phone]       Fax: [phone]   RxID:   [id number]

## 2010-04-14 ENCOUNTER — Telehealth: Payer: Self-pay | Admitting: Family Medicine

## 2010-04-14 NOTE — Telephone Encounter (Signed)
Printed and put up front for pick up

## 2010-04-15 LAB — URINALYSIS, ROUTINE W REFLEX MICROSCOPIC
Bilirubin Urine: NEGATIVE
Glucose, UA: NEGATIVE mg/dL
Ketones, ur: NEGATIVE mg/dL
Leukocytes, UA: NEGATIVE
pH: 6 (ref 5.0–8.0)

## 2010-04-15 LAB — CBC
Hemoglobin: 15.3 g/dL — ABNORMAL HIGH (ref 12.0–15.0)
MCHC: 34.8 g/dL (ref 30.0–36.0)
RBC: 4.87 MIL/uL (ref 3.87–5.11)
RDW: 13.9 % (ref 11.5–15.5)

## 2010-04-15 LAB — URINE CULTURE: Culture: NO GROWTH

## 2010-04-15 LAB — COMPREHENSIVE METABOLIC PANEL
ALT: 16 U/L (ref 0–35)
AST: 20 U/L (ref 0–37)
Alkaline Phosphatase: 68 U/L (ref 39–117)
Calcium: 9.3 mg/dL (ref 8.4–10.5)
GFR calc Af Amer: >60 mL/min (ref >60)
Potassium: 3.9 mEq/L (ref 3.5–5.1)
Sodium: 137 mEq/L (ref 135–145)
Total Protein: 7.3 g/dL (ref 6.0–8.3)

## 2010-04-15 LAB — DIFFERENTIAL
Basophils Relative: 1 % (ref 0–1)
Eosinophils Absolute: 0.1 10*3/uL (ref 0.0–0.7)
Eosinophils Relative: 1 % (ref 0–5)
Lymphs Abs: 2.8 10*3/uL (ref 0.7–4.0)
Monocytes Absolute: 0.6 10*3/uL (ref 0.1–1.0)
Monocytes Relative: 7 % (ref 3–12)

## 2010-04-15 LAB — URINE MICROSCOPIC-ADD ON

## 2010-04-15 LAB — PREGNANCY, URINE: Preg Test, Ur: NEGATIVE

## 2010-04-21 ENCOUNTER — Encounter (HOSPITAL_BASED_OUTPATIENT_CLINIC_OR_DEPARTMENT_OTHER)
Admission: RE | Admit: 2010-04-21 | Discharge: 2010-04-21 | Disposition: A | Discharge status: Home / Self Care | Payer: 59 | Source: Ambulatory Visit | Attending: Orthopedic Surgery | Admitting: Orthopedic Surgery

## 2010-04-21 LAB — BASIC METABOLIC PANEL
BUN: 9 mg/dL (ref 6–23)
GFR calc Af Amer: >60 mL/min (ref >60)
GFR calc non Af Amer: >60 mL/min (ref >60)
Potassium: 4.5 mEq/L (ref 3.5–5.1)

## 2010-04-22 ENCOUNTER — Ambulatory Visit (HOSPITAL_BASED_OUTPATIENT_CLINIC_OR_DEPARTMENT_OTHER)
Admission: RE | Admit: 2010-04-22 | Discharge: 2010-04-22 | Disposition: A | Discharge status: Home / Self Care | Payer: 59 | Source: Ambulatory Visit | Attending: Orthopedic Surgery | Admitting: Orthopedic Surgery

## 2010-04-22 DIAGNOSIS — Z01812 Encounter for preprocedural laboratory examination: Secondary | ICD-10-CM | POA: Insufficient documentation

## 2010-04-22 DIAGNOSIS — F411 Generalized anxiety disorder: Secondary | ICD-10-CM | POA: Insufficient documentation

## 2010-04-22 DIAGNOSIS — E669 Obesity, unspecified: Secondary | ICD-10-CM | POA: Insufficient documentation

## 2010-04-22 DIAGNOSIS — I10 Essential (primary) hypertension: Secondary | ICD-10-CM | POA: Insufficient documentation

## 2010-04-22 DIAGNOSIS — K219 Gastro-esophageal reflux disease without esophagitis: Secondary | ICD-10-CM | POA: Insufficient documentation

## 2010-04-22 DIAGNOSIS — G4733 Obstructive sleep apnea (adult) (pediatric): Secondary | ICD-10-CM | POA: Insufficient documentation

## 2010-04-22 DIAGNOSIS — G5600 Carpal tunnel syndrome, unspecified upper limb: Secondary | ICD-10-CM | POA: Insufficient documentation

## 2010-04-29 ENCOUNTER — Other Ambulatory Visit: Payer: Self-pay

## 2010-04-29 ENCOUNTER — Telehealth: Payer: Self-pay | Admitting: Family Medicine

## 2010-04-29 MED ORDER — ESOMEPRAZOLE MAGNESIUM 40 MG PO CPDR: 40.0000 mg | DELAYED_RELEASE_CAPSULE | Freq: Every day | ORAL | Status: DC

## 2010-04-29 NOTE — Telephone Encounter (Signed)
Every time she eats she goes straight to the bathroom with diarrhea. Someone told her there was a med that she could take for that. She had her gallbladder taken out in 2009 and she has had problems since. Wants to know if you know what med she is talking about and if she can try it, or what else can she do for it? Wants it sent to [facility]

## 2010-04-29 NOTE — Telephone Encounter (Signed)
immodium otc , or lomotil 1 four times daily as needed for diarreah #30 refill 3 if she wants a script

## 2010-04-30 NOTE — Telephone Encounter (Signed)
Patient aware.

## 2010-04-30 NOTE — Telephone Encounter (Signed)
Called patient, left message.

## 2010-05-02 NOTE — Op Note (Signed)
Stephanie Cannon, GARTNER               ACCOUNT NO.:  [id number]  MEDICAL RECORD NO.:  [id number]          PATIENT TYPE:  LOCATION:                                 FACILITY:  PHYSICIAN:  Dionne Ano. Amanda Pea, M.D.     DATE OF BIRTH:  DATE OF PROCEDURE: DATE OF DISCHARGE:                              OPERATIVE REPORT   PREOPERATIVE DIAGNOSIS:  Right carpal tunnel syndrome, severe.  POSTOPERATIVE DIAGNOSIS:  Right carpal tunnel syndrome, severe.  PROCEDURES: 1. Right median nerve status post nerve block __________ status post     carpal tunnel release. 2. Right __________  carpal tunnel release.  SURGEON:  Dionne Ano. Amanda Pea, MD  ASSISTANT:  Karie Chimera, PA-C  COMPLICATIONS:  None.  ANESTHESIA:  Peripheral nerve block with IV sedation.  The patient was alert and oriented during the entire case.  TOURNIQUET TIME:  Less than 20 minutes.  INDICATIONS FOR THE PROCEDURE:  This is a 40 year old female with severe carpal tunnel syndrome.  She presents for evaluation and treatment, surgical in nature.  I have discussed with the patient risks and benefits at length, do's and don'ts, etc.  Out goal is to get the pressure off the nerve giving nerve the better environment to live and hopefully recover to quiescence.  OPERATION:  The patient with Omnicef, anesthesia, taken to the operative suite, and underwent smooth induction, peripheral nerve/median nerve block was performed for fracture __________ for carpal tunnel release. She was given light IV sedation for this.  Following this, she was prepped and draped in usual sterile fashion.  Once this was done, the sterile scrub and paint was secured and following this, the arm was elevated, tourniquet was insufflated to 250 mmHg and the patient had 1- 1.5 cm incision made in the distal edge of transcarpal ligament. Dissection was carried down, palmar fascia incised, distal edge of transcarpal ligament was released under 4.0 loupe  magnification.  She tolerated this well.  Following full release distally with noted regression of the fat pad.  I then dissected in distal to proximal dissection until adequate room was available for canal preparatory device 1, 2, and 3 which were placed in distal end of the proximal leading leaflet of the transcarpal ligament.  She was awake, alert, and oriented during all passes.  She tolerated this very well.  Following this, security clip was placed, operative disengaged, security knife placed in a security clip to effectively release the proximal leaflet of transcarpal ligament.  The patient tolerated this well, and there were no complicating features.  Following this, I then inspected the canal. She was completely released, the median nerve was very hyperemic and flattened, there were no space-occupying lesions.  Fortunately, she had a nice release.  There were very significant signs of chronic compression including the hyperemic nerve, wall thickness which was impressive, and the flattening of the nerve.  Following the inspection, I then performed irrigation with saline followed by hemostasis being secured with a bipolar electrocautery as necessary.  I then closed the wound with Prolene.  She was placed in a sterile dressing and taken to recovery room.  She tolerated  this well.  There were no complicating features.  All sponge, needle, instrument counts were reported as correct.  She will be monitored in the recovery area. She will be discharged home on Percocet, vitamin C, vitamin B6, Peri- Colace.  She has my cell phone for any problems.  She will see me in 7 days, therapy in 14 days, notify me should any problems occur.  It was an absolute pleasure to participate in his care.  We look forward participating in her postoperative recovery and hopefully this will respond quickly.  However, given the hyperemia, there is always the unpredictable nature of when the nerve will be at  a quiescent state of affairs.     Dionne Ano. Amanda Pea, M.D.     [facility]  D:  04/22/2010  T:  04/23/2010  Job:  [id number]  Electronically Signed by Dominica Severin M.D. on 05/02/2010 07:48:16 AM

## 2010-05-18 NOTE — Op Note (Signed)
NAMEFELICE, HOPE               ACCOUNT NO.:  [id number]   MEDICAL RECORD NO.:  [id number]          PATIENT TYPE:  AMB   LOCATION:  DAY                           FACILITY:  [facility]   PHYSICIAN:  Tilford Pillar, MD      DATE OF BIRTH:  10-Jan-1970   DATE OF PROCEDURE:  03/19/2008  DATE OF DISCHARGE:                               OPERATIVE REPORT   PREOPERATIVE DIAGNOSIS:  Cholelithiasis.   POSTOPERATIVE DIAGNOSIS:  Cholelithiasis.   PROCEDURE:  Laparoscopic cholecystectomy.   SURGEON:  Tilford Pillar, MD   ANESTHESIA:  General endotracheal, local anesthetic, 0.5% Sensorcaine  plain.   SPECIMEN:  Gallbladder.   ESTIMATED BLOOD LOSS:  Minimal.   INDICATIONS:  The patient presented to my office with a history of right  upper quadrant and epigastric abdominal pain.  She had been treated for  gastroesophageal reflux with no change in her symptomatology.  She did  have evaluation with cholelithiasis and cholecystitis.  The risks,  benefits, and alternatives of a laparoscopic possible open  cholecystectomy were discussed at length with the patient including, but  not limited to the risk of bleeding, infection, bile leak, small bowel  injury, common bile duct injury as well as possibility of intraoperative  cardiac and pulmonary events.  The patient's questions and concerns were  addressed.  The patient consented for planned procedure.   OPERATION:  The patient was taken to the operating room, placed in the  supine position on the operating table, at which time the general  anesthetic was administered.  Once the patient was asleep, she was  endotracheally intubated by Anesthesia.  At this time, her abdomen was  prepped with DuraPrep solution and draped in standard fashion.  An  infraumbilical stab incision was created with a #11 blade scalpel.  Additional dissection down to the subcuticular tissue was carried out  using a Kocher clamp, which was utilized to grasp the anterior  abdominal  fascia and moved this anteriorly.  A Veress needle was inserted.  Saline  drop test was utilized to confirm intraperitoneal placement.  However,  based on the angulation, I did not feel comfortable and prior  infraumbilical scars, I did not feel uncomfortable proceeding with  continued attempts in this area.  Therefore, I did create a stab  incision at Palmer's point in the left subcostal margin through which I  reinserted the Veress needle and saline drop test was again utilized to  confirm intraperitoneal placement and then pneumoperitoneum was  initiated.  Once sufficient pneumoperitoneum was obtained, a 11-mm  trocar was inserted over a laparoscope in the previously created  infraumbilical stab incision.  This allowed visualization of the trocar  entering into the peritoneal cavity.  Upon entrance of the peritoneal  cavity, the inner cannula was removed.  The laparoscope was reinserted.  There was no evidence of any trocar or Veress needle placement injury.  There was significant amount of adhesions lateral and inferior to the  trocar, it was based on obscured visualization.  I opted to perform  lysis of adhesions before proceeding, I was able to see the  abdominal  wall clearly in the epigastrium with palpation of the anterior abdominal  wall.  Therefore, I did create a stab incision in this position and  placed a trocar under direct visualization of laparoscope.  I then  repositioned the laparoscope in this area.  I additionally placed the  right lateral abdominal wall, 5-mm trocar and used this as my working  port for the lysis of adhesions.  Significant adhesions were noted all  along the midline.  Superiorly, there was even noted to be in transverse  colon __________, I did take down the adhesions with a combination of  sharp dissection around the adherent bowel as well as Harmonic scalpel  and electrocautery dissection upon the omental adhesions.  Hemostasis  was  obtained.  Upon freeing the upper midline adhesions and then fully  circumferentially exposing the umbilical trocar, I turned my attention  to placement of the midline 5-mm trocar, this was placed.  At this time,  I turned my attention to proceeding with the cholecystectomy.   At this time, the patient was placed in reverse Trendelenburg left  lateral decubitus position.  The fundus of the gallbladder was grasped  and lifted up over the right lobe of the liver.  The peritoneal  reflection off the infundibulum was bluntly __________ with a Recruitment consultant.  The cystic duct was identified.  A window was created behind  the cystic duct.  Three EndoClips were placed proximally, one distally,  and the cystic duct was divided between the two most distal clips.  Similarly, the cystic artery was identified.  Two EndoClips were placed  proximally and one distally.  The cystic artery was divided between the  two most distal clips.  The posterior branch was clearly seen on the  gallbladder.  Again, clipped with two EndoClips proximally and one  distally and was divided.  Electrocautery was then utilized to dissect  the gallbladder free from gallbladder fossa.  Once it was free, I placed  into the EndoCatch bag and placed up and over the right lobe of liver.  Then, copiously irrigated the surgical field with sterile saline with  the returning aspirate was clear.  Hemostasis was excellent along the  gallbladder fossa.  I placed a piece of Surgicel into the gallbladder  fossa, clips were noted to be in excellent position.  There was no  evidence of any bile or bleeding coming from the base of the Endoclips.  At this time, I turned my attention to closure.   After repositioning the patient in supine position, I used an Endoclose  suture passing device, and passed a 2-0 Vicryl suture through both the  11-mm trocar sites.  With these sutures in place, the gallbladder was  grasped and was removed in  an intact EndoCatch bag through the umbilical  trocar site.  Some blunt dissection was used and sharp dissection was  required to enlarge the opening adequately to remove the gallbladder.  Once it was free, I placed in the back table and sent as permanent  specimen to Pathology.  At this time, the pneumoperitoneum was  evacuated.  The Vicryl sutures were secured.  The local anesthetic  instilled, 4-0 Monocryl was utilized to reapproximate the skin edges in  all 4 trocar sites.  Skin was washed, dried with moist and dry towel.  Benzoin was applied around the incision.  Half-inch Steri-Strips were  placed.  The drapes were removed.  The patient was allowed to come out  of general  anesthetic and the patient was transferred back to a regular  hospital bed.  She was transferred to the Postanesthetic Care Unit in a  stable condition.  At the conclusion of procedure, all instrument,  sponge, and needle counts were correct.  The patient tolerated the  procedure well.      Tilford Pillar, MD  Electronically Signed     BZ/MEDQ  D:  03/20/2008  T:  03/20/2008  Job:  [id number]   cc:   Milus Mallick. Lodema Hong, M.D.  Fax: [phone]

## 2010-05-18 NOTE — H&P (Signed)
Stephanie, Cannon               ACCOUNT NO.:  [id number]   MEDICAL RECORD NO.:  [id number]          PATIENT TYPE:  AMB   LOCATION:  DAY                           FACILITY:  [facility]   PHYSICIAN:  Tilford Pillar, MD      DATE OF BIRTH:  [birth year]   DATE OF ADMISSION:  DATE OF DISCHARGE:  LH                              HISTORY & PHYSICAL   CHIEF COMPLAINT:  Right upper quadrant abdominal pain.   HISTORY OF PRESENT ILLNESS:  The patient is a 40 year old female with a  history of increasing episodes of right upper quadrant abdominal pain.  She also states significant episodes of bloating and gas, both belching  and flatus.  She does complain of some shortness of breath, severe  episodes, and does have associated nausea and vomiting, although she  denies any hematemesis.  She again states bloating particularly after  fatty greasy foods, although at this time any food can cause increased  symptomatology.  Right upper quadrant pain does occasionally refer to  the back and she describes the pain is colicky in nature.  There is no  significant exacerbating or relieving factors.  She has had no history  of jaundice.  She does have occasional constipation.   PAST MEDICAL HISTORY:  Hypertension.   PAST SURGICAL HISTORY:  Hysterectomy.   MEDICATIONS:  Hydrochlorothiazide and Januvia.   ALLERGIES:  CODEINE.   SOCIAL HISTORY:  She is half a pack per day smoker.  No alcohol use or  drug use.  Occupation, she is a Scientist, physiological at [employer].  She has had one previous pregnancy.   PERTINENT FAMILY HISTORY:  No history of biliary disease in the family,  no history of Native American ancestry.   REVIEW OF SYSTEMS:  CONSTITUTIONAL:  Unremarkable.  EYES:  Unremarkable.  EARS, NOSE, AND, THROAT:  Unremarkable.  RESPIRATORY:  Occasional cough.  CARDIOVASCULAR:  Unremarkable.  GASTROINTESTINAL:  As per HPI, otherwise unremarkable.  GENITOURINARY:  Unremarkable.  MUSCULOSKELETAL:   Unremarkable.  SKIN:  Unremarkable.  ENDOCRINE:  Unremarkable.  NEURO:  Unremarkable.   PHYSICAL EXAMINATION:  GENERAL:  The patient is an obese female.  She is  alert and oriented.  She is clearly uncomfortable but no acute distress  is noted.  HEENT:  Scalp, no deformities, no masses.  Eyes, pupils equal, round,  reactive to light.  Extraocular movements are intact.  No scleral  icterus or conjunctival pallor is noted.  Oral mucosa pink.  Normal  occlusion.  NECK:  Trachea is midline.  No cervical lymphadenopathy.  PULMONARY:  Unlabored respiration.  No wheezes, no crackles.  She is  clear to auscultation bilaterally.  CARDIOVASCULAR:  Regular rate and rhythm.  She has 2+ radial and  dorsalis pedis pulses bilaterally.  ABDOMEN:  Positive bowel sounds.  Abdomen is soft.  She does have  positive right upper quadrant abdominal pain.  She has positive Murphy  sign.  No hernias.  No masses.  SKIN:  Warm and dry.   PERTINENT LABORATORY AND RADIOGRAPHIC STUDIES:  The patient had a red  right upper quadrant ultrasound  which does demonstrate cholelithiasis.  No ductal dilatation.  No pericholecystic fluid.   ASSESSMENT AND PLAN:  Cholelithiasis, acute exacerbation.  At this time,  we will continue the patient on pain medications.  I did discuss with  the patient and family the risks, benefits, and alternatives of a  laparoscopic possible open cholecystectomy including but not limited to  risks, bleeding, infection, bile leak, small bowel injury, common bile  duct injury, as well as possibility intraoperative cardiac and pulmonary  events.  At this time, the patient does wish to proceed but does wish to  proceed next week if possible.  We will continue her on oral pain  medication for analgesia.  Should she continue to have worsening  symptoms, she is to call my office or present to the emergency  department.  She understands that should her symptoms increase or worsen  and  hospitalization was required.  She does understand that this would  be the course taken; however, at this time we will plan to proceed as an  outpatient in the next week for the patient's convenience.      Tilford Pillar, MD  Electronically Signed     BZ/MEDQ  D:  03/12/2008  T:  03/13/2008  Job:  [id number]   cc:   Dr. Theodoro [facility]

## 2010-05-21 NOTE — Procedures (Signed)
   NAME:  Stephanie Cannon, BRANIFF                         ACCOUNT NO.:  [id number]   MEDICAL RECORD NO.:  [id number]                   PATIENT TYPE:  OUT   LOCATION:  RESP                                 FACILITY:  [facility]   PHYSICIAN:  Kofi A. Gerilyn Pilgrim, M.D.              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   ANALYSIS:  A 16 channel recording is conducted for 20 minutes.  There is a  posterior rhythm of approximately 10 hertz bilaterally.  There is also low  electrocortical voltage seen.  Sometimes the background activity goes in the  low beta range.  Awake and drowsy activity are seen.  Photic stimulation and  hypoventilation does not elicit any abnormal responses.  There is no focal  slowing, lateralized slowing, or epileptiform activity seen.   IMPRESSION:  This is a normal recording of the awake and drowsy state.  If  clinically indicated, repeat sleep-deprived recording may be useful.                                               Kofi A. Gerilyn Pilgrim, M.D.    KAD/MEDQ  D:  07/15/2002  T:  07/16/2002  Job:  [id number]

## 2010-05-21 NOTE — Op Note (Signed)
NAME:  Stephanie Cannon, Stephanie Cannon                         ACCOUNT NO.:  [id number]   MEDICAL RECORD NO.:  [id number]                   PATIENT TYPE:  INP   LOCATION:  9303                                 FACILITY:  [facility]   PHYSICIAN:  Maxie Better, M.D.            DATE OF BIRTH:  April 24, 1970   DATE OF PROCEDURE:  03/05/2003  DATE OF DISCHARGE:                                 OPERATIVE REPORT   PREOPERATIVE DIAGNOSIS:  Large complex left ovarian mass.   PROCEDURES:  1. Exploratory laparotomy.  2. Bilateral salpingo-oophorectomy.  3. Appendectomy.  4. Peritoneal washings.  5. Omentectomy.  6. Pelvic node dissection.  7. Extensive pelvic and abdominal adhesiolysis.   POSTOPERATIVE DIAGNOSES:  1. 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  2. Extensive abdominopelvic adhesions.   ANESTHESIA:  General.   SURGEON:  Maxie Better, M.D.   INTRAOPERATIVE CONSULTATION:  Pershing Cox, M.D., GYN oncologist.   INDICATIONS:  This is a 40 year old gravida 1, para 1, divorced black female  initially referred by Milus Mallick. Lodema Hong, M.D., for evaluation of a 15 cm  left complex ovarian mass noted on abdominal CT scan.  The patient's history  is notable for a previous cesarean section as well as a total abdominal  hysterectomy for symptomatic uterine fibroids in may 2003.  Please see the  dictated history and physical for all the specific details, the patient's  preoperative evaluation including a chest x-ray which was normal and tumor  markers that were all normal.  The patient underwent a bowel prep.  Risk and  benefit of the procedures planned, including the need for staging based on  the frozen section was discussed with the patient and her family.  Consent  was signed.  The patient was transferred to the operating room.   DESCRIPTION OF PROCEDURE:  Under adequate general anesthesia, the patient  was placed in a supine  position.  She was sterilely prepped and draped for a  vertical skin incision.  An indwelling Foley catheter was sterilely placed.  Marcaine 0.25% 10 mL was injected along the previous vertical skin incision.  The vertical skin incision was made subumbilical through the previous scar  and taken down to the rectus fascia using Bovie cautery.  Previous Prolene  sutures were at that time removed.  The rectus fascia was then carefully  opened superiorly and inferiorly.  The peritoneum was then opened with  evidence of the omentum adherent to the anterior lower abdominal wall.  With  a little bit more of an opening made, 500 mL of normal saline was placed for  peritoneal washings, 300 mL was subsequently removed and sent for evaluation  for cytology.  The peritoneum was then carefully opened after omental  adhesions were then lysed off the anterior abdominal wall and the additional  adhesion of the omentum to the bladder noted and left  as it was not in the  current area of the visible mass.  Findings on entry into the pelvis was  about an 18 cm cystic ovarian mass noted to be arising retroperitoneal and  buried under the rectosigmoid with the rectosigmoid wrapped around the mass  on the right side of the ovarian mass.  The mass in addition was also  adherent posteriorly as well and up to the left splenic flexure, transverse  colon, and to the vaginal cuff inferiorly.  Dissection of the mass was  ultimately started on the left lateral pelvic sidewall with careful  dissection and the need to stay close to the ovarian mass due to concerns  for devitalizing and affecting the blood supply to the bowel, which clearly  was adherent to this mass.  Blunt and careful dissection was done to try to  raise the mass, undermine the mass posteriorly.  The retroperitoneal space  on the left was opened and the common iliac vessels were noted on the left.  The left ureter was not seen despite looking for it in that  area.  The left  ovarian vessels were then subsequently noted and noted to be elongated,  extended along the mass.  These were isolated and free-tied after careful  digital examination did not suggest the ureter being present.  The  transverse colon was dissected off the mass superiorly.  The rectosigmoid,  which was displaced to the right lateral aspect of the mass, was then  approached as the mass was also being undermined posteriorly.  Lysis of the  adhesions surrounding the mass and involving the bowels were blunt and  sharply dissected.  Some areas of the bowel were densely adherent to this  ovarian cystic mass.  While still cognizant and having concerns for the need  to prevent bowel injury and as important, if not more important, the blood  supply to the bowels, colon, and rectosigmoid, the adhesiolysis was then  carefully performed and done in a meticulous fashion.  The cyst incidentally  ruptured with the  mucinous material noted.  The defect on the ovarian  cystic area was then quickly covered with several lap pads, however with  continued leakage noted on these pads.  Subsequent aspiration of about a  liter of cystic fluid contents was then done.  This led to the collapse of  the cyst on inspection, which this cyst was noted to be multilocular.  The  dissection was continued; however, some pieces of the cyst wall were still  adherent to the rectosigmoid and the other bowel areas.  The dissection was  continued in order to facilitate the frozen section, and the dissection was  continued to the level of the vaginal apex and ultimately the base of the  cystic mass was then clamped x2 at what was noted to be the apex.  The  specimen labeled as left ovarian cyst and ovary with tube was sent for  frozen section.  The procedure was continued once the specimen was removed.  Additional incision on the skin had been done around and above the umbilicus to about an inch and a half superiorly  and a Bookwalter had been used for  retraction due to the extensive size of this mass.  Once the specimen was  removed, the left ureter was noted to be traversing into the apical aspect  of where the clamp had been cut at the base of this cystic mass near the  vaginal apex.  Therefore, the left  ureter, which had been identified and  noted not to be dilated, was dissected out to the level of the vaginal apex  by Dr. Carey Bullocks, the oncologist, who was in the case.  No evidence of  ureteral damage was ultimately noted.  Intravenous indigo carmine was  injected after the dissection of the left ureter had been performed to the  entry of the vaginal cuff and to be noted not anywhere near the clamping.  There was no intra-abdominal spillage of the indigo carmine seen, and there  was indigo carmine noted in the Foley catheter.  Small bleeders around this  left ureter were identified and clipped.  During this time the frozen  section ultimately returned as borderline mucinous tumor.  An intraoperative  call was then made to the patient's mother regarding the contralateral tube  and ovary in light of this frozen section diagnosis.  Recommendations were  made for removal of the right tube and ovary in this circumstance, and  permission was granted by her mother.  The undersurface of the rectosigmoid  was inspected carefully.  Additional cyst wall which had been left as the  dissection was being made was subsequently and carefully removed off of the  bowel and the rectosigmoid.  Pelvic lymph node dissection was performed by  Dr. Carey Bullocks.  Please see her dictated operative note for all of her extent  of involvement in the case.  The vaginal cuff/apex was suture ligated with 0  Vicryl suture.  The appendix was then inspected and was noted to be  elongated and was planned to be removed in light of the mucinous tumor.  The  tip of the appendix was noted to be buried into the right ovary.  The right  ovary  was 3 cm, appeared otherwise normal except there were distal omental  adhesions as well as well as bowel adhesions to this ovary and adhesions  extended to the right pelvic sidewalls from the right ovary.  The right  round ligament was isolated, suture ligated with 0 Vicryl, then severed.  The right retroperitoneal space was opened.  The adhesions of the bowel and  the omentum to this ovary were lysed.  Given the appendiceal tip location,  the mesentery of the appendix was serially clamped and cut, the appendiceal  artery secured.  The appendix was then free-tied at the cecal end after the  base had been crushed x2.  The pursestring suture was then placed and the  appendiceal stump, which had been cauterized at the tip, was then buried  within it.  The right ovarian vessels were then isolated, clamped, cut, and  tied and suture ligated with 0 Vicryl.  The ovary was then freed from the  surrounding adhesions inferiorly.  The omentum was then inspected and removed in sequential pieces.  The bowels were again inspected.  Right  pelvic node dissection and additional node biopsies as noted in the  specimens sent for permanent section was all done by Dr. Carey Bullocks.  The right  ureter was clearly noted and the right retroperitoneal space opened as part  of the node dissection.  The bowel was then inspected for any residual  ovarian cyst remnants and all tissues consistent with or suggestive of  ovarian cyst were removed off the bowels and particularly the rectosigmoid  and the vaginal cuff area.  The diaphragmatic surface was then palpated by  Dr. Carey Bullocks.  The bowel was then inspected from the cecum to the  rectosigmoid systematically, with 2-3  mm white raised lesions noted on the  mesenteric surface.  Some of these lesions were removed for pathologic  evaluation, particularly implants on the rectosigmoid and in the small bowel  mesentery.  A bleeding vessel was then noted from one of the small   mesenteric implant removal sites, and this was initially cauterized without  success.  A box stitch suture was placed by Dr. Carey Bullocks with some small, non-  expanding hematoma subsequently noted thereafter.  Reinspection of the area  under the rectosigmoid was then continued, and particularly any bleeders  recognized and attended to.  Attention was then turned to omental adhesions  adherent to the bladder.  The omental adhesions were removed from the  bladder area.  The pelvis was then reinspected systematically from the far  left side all the way over to the right, assessing for any evidence of  ovarian cyst wall, bleeders, or any implants.  Additional biopsies were  removed by Dr. Carey Bullocks as noted in the staging and documented on the  specimen listing.  Bowels were inspected for any abnormal areas.  The small  mesenteric hematoma was inspected and continued to be non-expanding and was  not thought to be of concern.  The area of lymph node dissection was again  carefully evaluated bilaterally by Dr. Carey Bullocks.  Subsequently all packings  were then removed and retractors surrounding the Bookwalter were removed.  Four liters of warm D5W fluid was instilled into the abdomen and the bowels  and the intraperitoneal area was washed and copiously irrigated with this  fluid and suctioned.  The omentum was again inspected, a small bleeder was  sutured.  Good hemostasis was noted throughout on reinspection of the entire  pelvic area as well as the bowels and the nodal dissection area.  The  decision was then made that the patient had been adequately evaluated and  the rectus fascia was then closed with a double loop of Prolene x2.  The  skin was subsequently approximated with Ethicon staples.  The total time of  the surgery was 5-1/2 hours.   The specimens were the left tube and ovary, which had been sent for frozen  section at around 12:25 p.m.  Surgery had been initially started at 10 a.m. Specimens  sent for permanent section were rule out ovarian cyst wall from  the vaginal apex, rule out cyst wall from the ovary, and a separate specimen  from the retroperitoneum, rule out cyst wall from the ovary was then also  sent.  Left para-aortic lymph node sampling, left common iliac lymph node  sampling x2, the appendix adherent to the right tube and ovary was sent,  biopsy of the left gutter was done, the omentum was sent in five pieces,  biopsy of the pelvic peritoneal area was sent, biopsy of the right gutter,  the omental adhesion suggestive of area of ovarian cyst was also sent.  The  area that was thought to be possible ovarian cyst was identified with a  stitch placed at that site.  Right common iliac lymph nodes were removed.  The omentum that was densely adherent to the bladder was also sent.  Implants from the rectosigmoid and the root of the mesentery and small bowel  mesentery were also sent.   Estimated blood loss was 400 mL.  Intraoperative fluid was 5500 mL  crystalloid.  Urine output was 800 mL clear urine.  The sponge and  instrument counts x2 were correct.  Complication was none.  The  patient  tolerated the procedure well, was transferred to the recovery room in stable  condition.                                               Maxie Better, M.D.    [place]/MEDQ  D:  03/05/2003  T:  03/06/2003  Job:  [id number]   cc:   Milus Mallick. Lodema Hong, M.D.  [address]  [place], [place] [address]  Fax: [phone]   Pershing Cox, M.D.  [address]  [place]  [place] [address]  Fax: [phone]

## 2010-05-21 NOTE — H&P (Signed)
[facility]  Patient:    Stephanie Cannon, Stephanie Cannon Visit Number: [id number] MRN: [id number]          Service Type: OUT Location: RAD Attending Physician:  Syliva Overman Dictated by:   Elpidio Anis, M.D. Admit Date:  04/16/2001 Discharge Date: 04/16/2001                           History and Physical  HISTORY OF PRESENT ILLNESS:  A 40 year old female, gravida 1, para 1, with a longstanding history of pelvic and low back pain.  She has menses seven to eight days on oral contraceptives.  She uses eight super pads per day for about three days and then five to six pads per day for five days.  She is having clotting.  She has severe pain during her period and uses about 48 of 200 mg of Advil for each cycle.  She has had increase in nausea with decreased energy.  She has failed with Depo-Provera and Ortho-Tri Cyclen.  She has had continued bleeding even with Gynodiol.  She has been evaluated by OB/GYN and considered a hysterectomy in the past. She continues to have significant problems.  Ultrasound shows that she has a new multiple uterine fibroids with the largest being 6 by 4 by 2.8 cm.  Her ovaries are slightly cystic but otherwise unremarkable.  The patient is felt to have severe dysfunctional uterine bleeding due to uterine fibroids uncontrolled by medications.  She has significant pain.  It is felt that she has had intrauterine bleeding in the past from bleeding fibroids.  She is therefore scheduled for total abdominal hysterectomy.  PAST MEDICAL HISTORY:  Long history of low back pain.  History of depression.  MEDICATIONS: 1. Tylenol #2 with Codeine with 2 q.4h. p.r.n. 2. Skelaxin 400 mg t.i.d. 3. Hydrochlorothiazide 25 mg q.d.  REVIEW OF SYSTEMS:  Positive for myalgia, hemorrhoid disease, chronic low back pain.  PHYSICAL EXAMINATION:  VITAL SIGNS:  Blood pressure 120/90, pulse 76, respirations 18.  Weight 228 pounds.  HEENT:  No abnormalities  noted.  NECK:  Supple without JVD or bruit.  CHEST:  Clear to auscultation.  HEART:  Regular rate and rhythm without murmurs, rubs, or gallops.  ABDOMEN:  Soft with moderate suprapubic tenderness.  RECTAL:  Large hemorrhoids, internal and external.  EXTREMITIES:  No clubbing, cyanosis, or edema.  PELVIC:  Diffusely tender pelvis with very large nodular uterus which cannot be adequately visualized because of her large abdomen.  RECTAL:  Normal except for large hemorrhoids.  EXTREMITIES:  No clubbing, cyanosis, or edema.  NEUROLOGICAL:  No focal motor, sensory, or cerebellar deficits.  IMPRESSION: 1. Multiple uterine fibroids with dysfunctional uterine bleeding. 2. Chronic low back pain. 3. Morbid obesity.  PLAN:  Total abdominal hysterectomy. Dictated by:   Elpidio Anis, M.D. Attending Physician:  Syliva Overman DD:  05/17/01 TD:  05/17/01 Job: [id number]

## 2010-05-21 NOTE — Op Note (Signed)
NAME:  Stephanie Cannon, Stephanie Cannon                         ACCOUNT NO.:  [id number]   MEDICAL RECORD NO.:  [id number]                   PATIENT TYPE:  INP   LOCATION:  9303                                 FACILITY:  [facility]   PHYSICIAN:  Pershing Cox, M.D.            DATE OF BIRTH:  26-Jan-1970   DATE OF PROCEDURE:  03/05/2003  DATE OF DISCHARGE:                                 OPERATIVE REPORT   PREOPERATIVE DIAGNOSIS:  Complex ovarian mass.   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   I was asked to serve as an oncology consultant for Dr. Maxie Better.  She was exploring this patient, Stephanie Cannon, who had a complex ovarian  cyst found on CT scan.  The patient had had previous hysterectomy.   On entering the abdomen, there was no evidence of ascites.  The patient's  mass was clearly arising from the left ovary and was growing in the lateral  wall of the rectosigmoid distending the rectosigmoid mesentery and the  retroperitoneum to the level of the transverse colon.  We began the  dissection by opening the left pelvic sidewall and by moving the  rectosigmoid medially, we were able to start a very careful, meticulous, and  lengthy sharp dissection of the mass from the mesentery of the rectosigmoid.  When we approached the upper part of the dissection, the mass was densely  adherent to the mesenteric surface of the transverse colon.  The mass was  entered at this site and as it began leaking, we started to drain it so that  it would not leak throughout the peritoneal cavity.  Any leakage was  primarily contained in the retroperitoneum.  Once the mass had collapsed, we  then used the cyst wall to carefully and meticulously remove the entire cyst  wall from the rectosigmoid and retroperitoneum.  At the base of the mass  which was attached to what appeared to be the vaginal apex, the mass was  densely adherent to the ureter.  At one point  we thought we might have  transected the ureter, but a very careful ureterolysis was carried out of  the left ureter.  I put a Penrose drain around the ureter and then used a  circumferential dissection to take [place] down to the level of where our clamp  had cut through the bottom of the cyst wall.  There was no evidence of  injury to the ureter as it goes inferior to the site.  Once the diagnosis on  frozen returned as consistent with at least borderline mucinous tumor of the  ovary, I then assisted Dr. Cherly Hensen with an omentectomy.  Once the  omentectomy had been completed (see her dictation for this), I then  performed a pelvic lymphadenectomy.  This was performed by skeletonizing  both the common and internal iliac arteries.  The veins beneath the arteries  were exposed and  the tissue between the artery and vein were sampled.  On  the patient's left, there was a rubbery mass of nodes that extended along  the lateral side of the aorta and this was submitted as periaortic nodes.  Where appropriate, small clips were placed.  From time to time, cautery was  used to obtain good hemostasis.  There were abundant lymph nodes found on  the external iliac artery.  There was no evidence of nodes in the obturator  space.  On the patient's right, the only lymph nodes that were noted were  lying along the surface of the external iliac artery.  This chain of  dissection extended onto the common.   Thorough exploratory laparotomy was performed.  There was no evidence of  implant on the diaphragm.  There were multiple small implants in the cul-de-  sac.  These were sampled.  In running the bowel from the terminal ileum to  the ligament of Treitz, there were a number of small pebbly like implants  lying along the inferior surface of the root of the mesentery.  These were  sampled.  While sampling one of the areas along the ileum, bleeding ensued  which created about a dollar size hematoma.  We carefully  watched this area  to see if there was any extension of the  hematoma and there was not.  The bowel beneath the hematoma was pink and  healthy.  As will be described in Dr. Cherly Hensen note, the right ovary was  adherent to the appendix.  Therefore, in order to move the ovary and the  appendix, it was necessary to excise them en toto.                                               Pershing Cox, M.D.    MAJ/MEDQ  D:  03/05/2003  T:  03/05/2003  Job:  [id number]   cc:   Maxie Better, M.D.  [address]  [place]  [place] [address]  Fax: [phone]

## 2010-05-21 NOTE — Op Note (Signed)
[facility]  Patient:    Stephanie Cannon, Stephanie Cannon Visit Number: [id number] MRN: [id number]          Service Type: MED Location: 3A A305 01 Attending Physician:  Dessa Phi Dictated by:   Elpidio Anis, M.D. Proc. Date: 05/18/01 Admit Date:  05/18/2001 Discharge Date: 05/21/2001                             Operative Report  PREOPERATIVE DIAGNOSES:  Dysfunctional uterine bleeding, multiple uterine fibroids.  POSTOPERATIVE DIAGNOSES:  Dysfunctional uterine bleeding, multiple uterine fibroids.  PROCEDURE:  Total abdominal hysterectomy.  SURGEON:  Elpidio Anis, M.D.  DESCRIPTION OF PROCEDURE:  Under general anesthesia, the patients abdomen was prepped and draped in a sterile field. A lower midline incision was made. Upon entering the abdomen, a moderately enlarged uterus with multiple intramural and serosal fibroids was encountered. The ovaries appeared to be normal. The upper abdomen on the exam was normal. The abdomen was packed off. The round ligaments were isolated, tied with ligatures of #0 Dexon. The tubo-ovarian complex was doubly clamped at its junction with the uterus. This was done bilaterally. They were incised and controlled with ligatures of #0 Dexon. The anterior bladder flap was developed. The uterine vessels were doubly clamped, divided and controlled with ligatures of #0 Dexon. This was continued down to the cervix. The apex of the vagina was clamped with curved Heaney clamps, divided and closed with #0 Dexon. The apex was oversewn using running locking #0 Biosyn. Irrigation was carried out. Estimated blood loss was 100 cc. Sponge, needle, instrument and blade counts were verified as correct x2. The abdomen was closed using #1 Prolene on the fascia, 3-0 Biosyn in the subcutaneous tissue and staples on the skin. The patient tolerated the procedure well. She was awakened from anesthesia uneventfully and transferred to a bed and taken to the  post anesthesia care unit for further monitoring. Dictated by:   Elpidio Anis, M.D. Attending Physician:  Dessa Phi DD:  05/18/01 TD:  05/21/01 Job: [id number]

## 2010-05-21 NOTE — Procedures (Signed)
   Stephanie Cannon, Stephanie Cannon                           ACCOUNT NO.:  [id number]   MEDICAL RECORD NO.:  [id number]                   PATIENT TYPE:  EMS   LOCATION:  ED                                   FACILITY:  [facility]   PHYSICIAN:  Edward L. Juanetta Gosling, M.D.             DATE OF BIRTH:  [birth year]   DATE OF PROCEDURE:  06/28/2002  DATE OF DISCHARGE:  06/29/2002                                EKG INTERPRETATION   DATE AND TIME OF TEST:  June 28, 2002 at 2101.   FINDINGS:  The rhythm is sinus rhythm with a rate in the 70s.  There are  inferior ST changes which are not specific.  Borderline electrocardiogram.                                               Ramon Dredge L. Juanetta Gosling, M.D.    [facility]/MEDQ  D:  07/01/2002  T:  07/01/2002  Job:  [id number]

## 2010-05-21 NOTE — Procedures (Signed)
NAMEFAY, BAGG               ACCOUNT NO.:  [id number]   MEDICAL RECORD NO.:  [id number]          PATIENT TYPE:  OUT   LOCATION:  SLEE                          FACILITY:  [facility]   PHYSICIAN:  Kofi A. Gerilyn Pilgrim, M.D. DATE OF BIRTH:  August 28, 1970   DATE OF PROCEDURE:  DATE OF DISCHARGE:  08/09/2007                             SLEEP DISORDER REPORT   HISTORY:  This is a 40 year old lady who presents with witnessed apnea,  snoring, and is being evaluated for obstructive sleep apnea syndrome.   MEDICATIONS:  1. Naproxen.  2. Hydrochlorothiazide.  3. Gabapentin.  4. Clonazepam.   Epworth sleepiness scale 0.  BMI 49.   SLEEP STAGE SUMMARY:  The total recording time is 369 minutes.  The  sleep efficiency is 94%.  Stage N1 7%, N2 76%, N3 0%, and REM sleep 17%.   RESPIRATORY SUMMARY:  Baseline oxygen saturation 98%, lowest saturation  88%.  AHI is 6.2.   LIMB MOVEMENT SUMMARY:  PLM index 2.2.   ELECTROCARDIOGRAM SUMMARY:  Average heart rate is 75 with no arrhythmias  observed.   IMPRESSION:  Mild obstructive sleep apnea syndrome, not requiring  treatment.      Kofi A. Gerilyn Pilgrim, M.D.  Electronically Signed     KAD/MEDQ  D:  08/13/2007  T:  08/13/2007  Job:  [id number]

## 2010-05-21 NOTE — Discharge Summary (Signed)
NAMEFLORAINE, Stephanie Cannon                         ACCOUNT NO.:  [id number]   MEDICAL RECORD NO.:  [id number]                   PATIENT TYPE:  INP   LOCATION:  9303                                 FACILITY:  [facility]   PHYSICIAN:  Maxie Better, M.D.            DATE OF BIRTH:  [birth year]   DATE OF ADMISSION:  03/05/2003  DATE OF DISCHARGE:  03/09/2003                                 DISCHARGE SUMMARY   ADMISSION DIAGNOSIS:  Large left complex ovarian mass.   DISCHARGE DIAGNOSES:  1. 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  2. Postoperative ileus resolved.   PROCEDURES:  Exploratory laparotomy, bilateral salpingo-oophorectomy,  appendectomy, peritoneal washings, omentectomy, pelvic node dissection,  extensive pelvic and abdominal adhesiolysis.   HISTORY OF PRESENT ILLNESS:  [age]-year-old gravida 1, para 1  divorced black female admitted for surgical management of a 15 cm left  complex ovarian mass noted on abdominal CT scan ordered by Dr. Syliva Overman who is the patient's internist.  The patient's history is notable  for previous total abdominal hysterectomy for symptomatic uterine fibroids  in May 2003.  At that time the operative report notes that the ovaries were  normal bilaterally.  All tumor markers were normal.   HOSPITAL COURSE:  The patient was admitted to [facility].  She was  taken to the operating room where she underwent the above procedures.  Please see the dictated operative note for the details of her surgery.  The  patient's hospital course was complicated by postop ileus probably due to  the extensive lysis of adhesions at the time of her surgery.  The patient  has remained without oral input until hospital day #3.  Dulcolax suppository  was tried.  Patient was started at sips of clear liquids.  Magnesium and  potassium were checked to rule out electrolyte causes for ileus.  Her  incision which was vertical had no erythema, induration, or  exudate.  Her  abdomen was soft, distended, hypoactive bowel sounds at that time.  She  remained afebrile throughout her course.  By postop day #4 the patient was  complaining of gas pain, she had not passed anymore flatus since her  Dulcolax suppository, she was afebrile, her diet was advanced, Dulcolax was  repeated after she had tolerated a regular diet.  The patient was monitored  until late evening for any evidence of problems with her ileus which  appeared to have resolved.  Patient did well on the evening of March 09, 2003  and was deemed well to be discharged home.   DISPOSITION:  Home.   CONDITION:  Stable.   DISCHARGE MEDICATIONS:  1. Climara 0.1 mg patch every week.  2. Tylox one to two tablets every 3-4 hours p.r.n. pain.   FOLLOW UP:  Followup appointment on Thursday on March 13, 2003 for staple  removal and otherwise postop check in 4-6 weeks.   DISCHARGE INSTRUCTIONS:  No  heavy lifting greater than or equal to 25  pounds.  No driving or intercourse for 2 weeks.  Call if increased  incisional pain, redness or drainage from the incision site, nausea,  vomiting, severe abdominal pain, temperature greater than or equal to 100.4.                                               Maxie Better, M.D.    [place]/MEDQ  D:  04/04/2003  T:  04/06/2003  Job:  [id number]   cc:   Milus Mallick. Lodema Hong, M.D.  [address]  [place], [place] [address]  Fax: [phone]

## 2010-05-21 NOTE — Discharge Summary (Signed)
[facility]  Patient:    Stephanie Cannon, Stephanie Cannon Visit Number: [id number] MRN: [id number]          Service Type: MED Location: 3A A305 01 Attending Physician:  Dessa Phi Dictated by:   Elpidio Anis, M.D. Admit Date:  05/18/2001 Discharge Date: 05/21/2001                             Discharge Summary  DISCHARGE DIAGNOSES: 1. Severe dysfunctional uterine bleeding. 2. Multiple uterine fibroids. 3. Hypokalemia.  SPECIAL PROCEDURE:  Total abdominal hysterectomy, May 18, 2001.  DISPOSITION:  The patient is discharged home in stable and satisfactory condition.  DISCHARGE MEDICATIONS: 1. Tylox two every four hours as needed for pain. 2. Lotrisone cream to apply to rash locally t.i.d.  FOLLOW-UP:  She is scheduled to be seen in the office in two weeks.  HISTORY OF PRESENT ILLNESS:  A 40 year old gravida 1, para 1, female with a long history of pelvic and low back pain.  She had menses of seven to eight days per cycle on oral contraceptives.  She used eight super pads per day for about three days and then five to six pads per day for five days.  She had clotting.  She had severe pain with her period.  She used about 48 Advil for each cycle.  She had increased nausea, decreased energy.  She failed both Depo-Provera and Ortho Tri-Cyclen.  She had continued bleeding.  She was evaluated by the OB/GYN and considered for hysterectomy.  Ultrasound showed multiple uterine fibroids with slightly cystic ovaries.  It was felt that she had severe dysfunctional bleeding uncontrolled medication, and she had uncontrolled pain.  She was therefore scheduled for total abdominal hysterectomy.  PAST MEDICAL HISTORY:  Otherwise unremarkable except for depression.  MEDICATIONS:  Tylenol No. 2 With Codeine two every four hours, and hydrochlorothiazide 25 mg q.d.  PHYSICAL EXAMINATION:  GENERAL:  She was mildly obese.  ABDOMEN:  Soft with moderate suprapubic  tenderness.  RECTAL:  She had large internal and external hemorrhoids.  PELVIC:  Diffusely tender pelvis wit large, multinodular uterus, which was not adequately palpated because of the large size of her abdomen.  HOSPITAL COURSE:  The patient was admitted through day surgery and underwent total abdominal hysterectomy on May 16.  She had a multilobular uterus with serosal and intramural fibroids.  She had some postoperative itching, the etiology of which was never determined.  She had rapid advancement in her diet without difficulty.  Hemoglobin remained stable.  She had leukocytosis but no fever.  By the 19th she was stable.  She had some perineal itching, which was treated with Lotrisone cream.  She had no further problems and was discharged home in stable and satisfactory condition. Dictated by:   Elpidio Anis, M.D. Attending Physician:  Dessa Phi DD:  07/17/01 TD:  07/20/01 Job: [id number]

## 2010-05-25 ENCOUNTER — Other Ambulatory Visit: Payer: Self-pay | Admitting: Family Medicine

## 2010-06-09 ENCOUNTER — Other Ambulatory Visit: Payer: Self-pay | Admitting: Family Medicine

## 2010-06-23 ENCOUNTER — Telehealth: Payer: Self-pay | Admitting: Family Medicine

## 2010-06-23 ENCOUNTER — Other Ambulatory Visit: Payer: Self-pay | Admitting: Family Medicine

## 2010-06-23 DIAGNOSIS — R609 Edema, unspecified: Secondary | ICD-10-CM

## 2010-06-23 MED ORDER — POTASSIUM CHLORIDE 10 MEQ PO TBCR: EXTENDED_RELEASE_TABLET | ORAL | Status: DC

## 2010-06-23 MED ORDER — FUROSEMIDE 20 MG PO TABS: ORAL_TABLET | ORAL | Status: DC

## 2010-06-23 NOTE — Telephone Encounter (Signed)
Pt aware med is being sent to her pharmacy

## 2010-07-02 ENCOUNTER — Encounter: Payer: Self-pay | Admitting: Family Medicine

## 2010-07-06 ENCOUNTER — Ambulatory Visit (INDEPENDENT_AMBULATORY_CARE_PROVIDER_SITE_OTHER): Payer: 59 | Admitting: Family Medicine

## 2010-07-06 ENCOUNTER — Encounter: Payer: Self-pay | Admitting: Family Medicine

## 2010-07-06 VITALS — BP 142/70 | HR 100 | Resp 16 | Ht 62.0 in | Wt 292.0 lb

## 2010-07-06 DIAGNOSIS — Z13820 Encounter for screening for osteoporosis: Secondary | ICD-10-CM

## 2010-07-06 DIAGNOSIS — R635 Abnormal weight gain: Secondary | ICD-10-CM

## 2010-07-06 DIAGNOSIS — I10 Essential (primary) hypertension: Secondary | ICD-10-CM

## 2010-07-06 DIAGNOSIS — Z13220 Encounter for screening for lipoid disorders: Secondary | ICD-10-CM

## 2010-07-06 DIAGNOSIS — E669 Obesity, unspecified: Secondary | ICD-10-CM

## 2010-07-06 DIAGNOSIS — M549 Dorsalgia, unspecified: Secondary | ICD-10-CM

## 2010-07-06 DIAGNOSIS — E785 Hyperlipidemia, unspecified: Secondary | ICD-10-CM

## 2010-07-06 DIAGNOSIS — R5383 Other fatigue: Secondary | ICD-10-CM

## 2010-07-06 DIAGNOSIS — R5381 Other malaise: Secondary | ICD-10-CM

## 2010-07-06 DIAGNOSIS — R609 Edema, unspecified: Secondary | ICD-10-CM

## 2010-07-06 DIAGNOSIS — R600 Localized edema: Secondary | ICD-10-CM

## 2010-07-06 MED ORDER — TRIAMTERENE-HCTZ 37.5-25 MG PO TABS: 1.0000 | ORAL_TABLET | Freq: Every day | ORAL | Status: DC

## 2010-07-06 NOTE — Progress Notes (Signed)
  Subjective:    Patient ID: Stephanie Cannon, female    DOB: 03-01-1970, 40 y.o.   MRN: [id number]  HPI Pt has been having chronic and uncontrolled back pain with leg swelling, she has had no success with epidural injections administered. She has had 3 in the past 6 months, and has also gained approx 30 pounds over that time reportedly. She is frustrated , depressed and overwhelmed with her weight , and is now signed up for bariatric class. Has upcoming gynae eval and mammogram. Lbs are overdue   Review of Systems Denies recent fever or chills. Denies sinus pressure, nasal congestion, ear pain or sore throat. Denies chest congestion, productive cough or wheezing. Denies chest pains or palpitations,sh has significant  leg swelling Denies abdominal pain, nausea, vomiting,diarrhea or constipation.   Denies dysuria, frequency, hesitancy or incontinence. Chronic uncontrolled back pain with marked  limitation in mobility. Denies headaches, seizure, numbness, or tingling.Great results from recent carpal tunnel surgery.  Denies skin break down or rash.        Objective:   Physical Exam    Patient alert and oriented and in no Cardiopulmonary distress.  HEENT: No facial asymmetry, EOMI, , Oropharynx pink and moist.  Neck supple no adenopathy.  Chest: Clear to auscultation bilaterally.  CVS: S1, S2 no murmurs, no S3.  ABD: Soft non tender. Bowel sounds normal.  Ext: 2 plus  edema  MS: decreased  ROM spine,adequate in  shoulders, hips and knees.  Skin: Intact, no ulcerations or rash noted.  Psych: Good eye contact, normal affect. Memory intact not anxious or depressed appearing.  CNS: CN 2-12 intact, power, tone and sensation normal throughout.     Assessment & Plan:

## 2010-07-06 NOTE — Patient Instructions (Addendum)
F/u in 6 to 8 weeks.  pls discontinue the amlodipine, new for your blood pressure, is maxzide, take one daily.Do not take lasix while taking the maxzide unless your legs are extremely swollen  pls follow a specific diet and you will be referred for individual counselling.  Fasting labs next week Friday, July 13 or after  We will give you a 1600 calorie Diet sheet.  I do support the decision for surgical intervention

## 2010-07-08 ENCOUNTER — Encounter: Payer: Self-pay | Admitting: Family Medicine

## 2010-07-08 DIAGNOSIS — R600 Localized edema: Secondary | ICD-10-CM | POA: Insufficient documentation

## 2010-07-08 DIAGNOSIS — E785 Hyperlipidemia, unspecified: Secondary | ICD-10-CM | POA: Insufficient documentation

## 2010-07-08 NOTE — Assessment & Plan Note (Signed)
Low fat diet discussed and encouraged, pt to have updated labs, Also an HBA1C will be checked

## 2010-07-08 NOTE — Assessment & Plan Note (Signed)
Deteriorated, has had epidurals with no benefit, has gained an excessive amt of weight, states she has been told weight loss is her first and most viable option, now starting the process of surgical intervention for obesiry

## 2010-07-08 NOTE — Assessment & Plan Note (Signed)
Deteriorated, pt to discontinue lasix once she has started the maxzide, sh is encouraged to keep her legs elevated and reduce sodium intake

## 2010-07-08 NOTE — Assessment & Plan Note (Signed)
Controlled, but i am concerned that amlodipine may be aggravating her leg swelling , will switch her to [facility]

## 2010-07-08 NOTE — Assessment & Plan Note (Signed)
Deteriorated. Patient re-educated about  the importance of commitment to a  minimum of 150 minutes of exercise per week. The importance of healthy food choices with portion control discussed. Encouraged to start a food diary, count calories and to consider  joining a support group. Sample diet sheets offered. Goals set by the patient for the next several months.  will prescribe phentermine next visit if her BP is nl

## 2010-07-22 ENCOUNTER — Other Ambulatory Visit: Payer: Self-pay | Admitting: Obstetrics and Gynecology

## 2010-07-22 DIAGNOSIS — Z1231 Encounter for screening mammogram for malignant neoplasm of breast: Secondary | ICD-10-CM

## 2010-07-23 ENCOUNTER — Other Ambulatory Visit: Payer: Self-pay | Admitting: Family Medicine

## 2010-08-10 ENCOUNTER — Telehealth: Payer: Self-pay | Admitting: Family Medicine

## 2010-08-10 NOTE — Telephone Encounter (Signed)
Let her know form is here, needs THREE consecutive visits when she sees me with specific info obtained, so ensure she makes and keeps appt in August

## 2010-08-11 ENCOUNTER — Telehealth: Payer: Self-pay | Admitting: Family Medicine

## 2010-08-11 NOTE — Telephone Encounter (Signed)
Patient aware.

## 2010-08-11 NOTE — Telephone Encounter (Signed)
This pt needs scheduled MD visits once monthly with me for the next 3 months, just coming in to weight is NOT sufficient, I have the guidelines which state this pls sched her appts once she clearly understands from you

## 2010-09-02 ENCOUNTER — Other Ambulatory Visit: Payer: Self-pay | Admitting: Family Medicine

## 2010-09-03 ENCOUNTER — Ambulatory Visit
Admission: RE | Admit: 2010-09-03 | Discharge: 2010-09-03 | Disposition: A | Discharge status: Home / Self Care | Payer: 59 | Source: Ambulatory Visit | Attending: Obstetrics and Gynecology | Admitting: Obstetrics and Gynecology

## 2010-09-03 DIAGNOSIS — Z1231 Encounter for screening mammogram for malignant neoplasm of breast: Secondary | ICD-10-CM

## 2010-09-07 ENCOUNTER — Ambulatory Visit (INDEPENDENT_AMBULATORY_CARE_PROVIDER_SITE_OTHER): Payer: 59 | Admitting: Family Medicine

## 2010-09-07 ENCOUNTER — Encounter: Payer: Self-pay | Admitting: Family Medicine

## 2010-09-07 VITALS — BP 140/90 | HR 83 | Resp 16 | Ht 62.0 in | Wt 287.8 lb

## 2010-09-07 DIAGNOSIS — E785 Hyperlipidemia, unspecified: Secondary | ICD-10-CM

## 2010-09-07 DIAGNOSIS — R600 Localized edema: Secondary | ICD-10-CM

## 2010-09-07 DIAGNOSIS — R609 Edema, unspecified: Secondary | ICD-10-CM

## 2010-09-07 DIAGNOSIS — K219 Gastro-esophageal reflux disease without esophagitis: Secondary | ICD-10-CM

## 2010-09-07 DIAGNOSIS — M549 Dorsalgia, unspecified: Secondary | ICD-10-CM

## 2010-09-07 DIAGNOSIS — I10 Essential (primary) hypertension: Secondary | ICD-10-CM

## 2010-09-07 DIAGNOSIS — E669 Obesity, unspecified: Secondary | ICD-10-CM

## 2010-09-07 MED ORDER — CYCLOBENZAPRINE HCL 10 MG PO TABS: 10.0000 mg | ORAL_TABLET | Freq: Two times a day (BID) | ORAL | Status: DC | PRN

## 2010-09-07 MED ORDER — TRIAMTERENE-HCTZ 37.5-25 MG PO TABS: ORAL_TABLET | ORAL | Status: DC

## 2010-09-07 NOTE — Patient Instructions (Addendum)
F/u in 4 to 5  Weeks   Dose increase in medication to one and a half once daily of triamterene, yoir blood pressure is still high.  Pls work on goals discussed and documented on paper given. Do not eat after 8 pm. Zero cal drinks only Half plate for lunch and dinner, watery vegetables only  Goal is 4 to 5 pound weight loss

## 2010-09-19 NOTE — Assessment & Plan Note (Signed)
Controlled, no change in medication  

## 2010-09-19 NOTE — Progress Notes (Signed)
  Subjective:    Patient ID: Stephanie Cannon, female    DOB: [birth year], 40 y.o.   MRN: [id number]  HPI The PT is here for follow up and re-evaluation of chronic medical conditions, medication management and review of any available recent lab and radiology data.  Preventive health is updated, specifically  Cancer screening and Immunization.   She reports marked improvement in leg swelling with a change in her blood pressure medication, and has had no adverse side effects from her current med, specifically denies cramps, light headedness or chest pain. Pt is enrolled in the bariatric surgery program, and is also here for the required monthly f/u    Review of Systems See HPI Denies recent fever or chills. Denies sinus pressure, nasal congestion, ear pain or sore throat. Denies chest congestion, productive cough or wheezing. Denies chest pains, palpitations and leg swelling Denies abdominal pain, nausea, vomiting,diarrhea or constipation.   Denies dysuria, frequency, hesitancy or incontinence. Chronic back pain and stiffness, uncontrolled, takes medication for this Denies headaches, seizures, numbness, or tingling. Relies on xanax for assistance with anxiety and mild insomnia. Denies skin break down or rash.        Objective:   Physical Exam  Patient alert and oriented and in no cardiopulmonary distress.  HEENT: No facial asymmetry, EOMI, no sinus tenderness,  oropharynx pink and moist.  Neck supple no adenopathy.  Chest: Clear to auscultation bilaterally.  CVS: S1, S2 no murmurs, no S3.  ABD: Soft non tender. Bowel sounds normal.  Ext: No edema  [id number]  ROM spine,adequate in  shoulders, hips and knees.  Skin: Intact, no ulcerations or rash noted.  Psych: Good eye contact, normal affect. Memory intact not anxious or depressed appearing.  CNS: CN 2-12 intact, power, tone and sensation normal throughout.       Assessment & Plan:

## 2010-09-19 NOTE — Assessment & Plan Note (Signed)
Improved with change in anti-hypertensive

## 2010-09-19 NOTE — Assessment & Plan Note (Signed)
Low fat diet discussed and encouraged, updated labs needed

## 2010-09-19 NOTE — Assessment & Plan Note (Signed)
unchanged

## 2010-09-19 NOTE — Assessment & Plan Note (Signed)
Uncontrolled, dose increase in medication 

## 2010-09-19 NOTE — Assessment & Plan Note (Signed)
Improved. Pt applauded on succesful weight loss through lifestyle change, and encouraged to continue same. Weight loss goal set for the next several months.  

## 2010-10-04 LAB — DIFFERENTIAL
Basophils Absolute: 0
Basophils Relative: 0
Lymphocytes Relative: 16
Monocytes Absolute: 0.7
Neutro Abs: 8.7 — ABNORMAL HIGH
Neutrophils Relative %: 78 — ABNORMAL HIGH

## 2010-10-04 LAB — URINALYSIS, ROUTINE W REFLEX MICROSCOPIC
Glucose, UA: NEGATIVE
Hgb urine dipstick: NEGATIVE
Protein, ur: NEGATIVE
pH: 7.5

## 2010-10-04 LAB — COMPREHENSIVE METABOLIC PANEL
Alkaline Phosphatase: 80
BUN: 13
CO2: 30
Chloride: 99
Creatinine, Ser: 0.85
GFR calc non Af Amer: >60
Glucose, Bld: 99
Potassium: 3.8
Total Bilirubin: 1.2

## 2010-10-04 LAB — LIPASE, BLOOD: Lipase: 20

## 2010-10-04 LAB — CBC
HCT: 47.3 — ABNORMAL HIGH
Hemoglobin: 16.2 — ABNORMAL HIGH
MCV: 90.8
WBC: 11.2 — ABNORMAL HIGH

## 2010-10-07 ENCOUNTER — Encounter: Payer: Self-pay | Admitting: Family Medicine

## 2010-10-08 ENCOUNTER — Encounter: Payer: Self-pay | Admitting: Family Medicine

## 2010-10-11 ENCOUNTER — Encounter: Payer: Self-pay | Admitting: Family Medicine

## 2010-10-11 ENCOUNTER — Ambulatory Visit (INDEPENDENT_AMBULATORY_CARE_PROVIDER_SITE_OTHER): Payer: 59 | Admitting: Family Medicine

## 2010-10-11 VITALS — BP 118/72 | HR 92 | Resp 16 | Ht 62.0 in | Wt 277.4 lb

## 2010-10-11 DIAGNOSIS — E669 Obesity, unspecified: Secondary | ICD-10-CM

## 2010-10-11 DIAGNOSIS — I10 Essential (primary) hypertension: Secondary | ICD-10-CM

## 2010-10-11 DIAGNOSIS — M549 Dorsalgia, unspecified: Secondary | ICD-10-CM

## 2010-10-11 DIAGNOSIS — K219 Gastro-esophageal reflux disease without esophagitis: Secondary | ICD-10-CM

## 2010-10-11 MED ORDER — PHENTERMINE HCL 37.5 MG PO TABS: 37.5000 mg | ORAL_TABLET | Freq: Every day | ORAL | Status: DC

## 2010-10-11 NOTE — Assessment & Plan Note (Signed)
Controlled, no change in medication  

## 2010-10-11 NOTE — Assessment & Plan Note (Signed)
Improved, no med change 

## 2010-10-11 NOTE — Patient Instructions (Addendum)
F/u in 4 weeks.  Pls fax over recent labs.  Continue once daily phentermine  BP is excellent.  Start walking at least 5 days per week, 20 mins total. Continue to focus on fruit and vegetables and natural unproceesed foods.  Aim for regular sleep pattern with at least 7 to 8 hours of sleep per night.

## 2010-10-11 NOTE — Progress Notes (Signed)
  Subjective:    Patient ID: Stephanie Cannon, female    DOB: [birth year], 40 y.o.   MRN: [id number]  HPI Pt in for f/u of obesity and hypertension. She has had excellent weight loss results , has not been exercising on a regular basis but intends to change this.She is extremely excited about the weight loss,and intends to change this.    Review of Systems Denies recent fever or chills. Denies sinus pressure, nasal congestion, ear pain or sore throat. Denies chest congestion, productive cough or wheezing. Denies chest pains, palpitations and leg swelling Denies abdominal pain, nausea, vomiting,diarrhea or constipation.   Denies dysuria, frequency, hesitancy or incontinence. Denies joint pain, swelling and limitation in mobility. Denies headaches, seizures, numbness, or tingling.      Objective:   Physical Exam Patient alert and oriented and in no cardiopulmonary distress.  HEENT: No facial asymmetry, EOMI, no sinus tenderness,  oropharynx pink and moist.  Neck supple no adenopathy.  Chest: Clear to auscultation bilaterally.  CVS: S1, S2 no murmurs, no S3.  ABD: Soft non tender. Bowel sounds normal.  Ext: No edema  MS: Adequate ROM spine, shoulders, hips and knees.  Skin: Intact, no ulcerations or rash noted.  Psych: Good eye contact, normal affect. Memory intact not anxious or depressed appearing.  CNS: CN 2-12 intact, power, tone and sensation normal throughout.       Assessment & Plan:

## 2010-11-12 ENCOUNTER — Other Ambulatory Visit: Payer: Self-pay | Admitting: Family Medicine

## 2010-11-22 ENCOUNTER — Encounter: Payer: Self-pay | Admitting: Family Medicine

## 2010-12-01 ENCOUNTER — Ambulatory Visit (INDEPENDENT_AMBULATORY_CARE_PROVIDER_SITE_OTHER): Payer: 59 | Admitting: Family Medicine

## 2010-12-01 ENCOUNTER — Encounter: Payer: Self-pay | Admitting: Family Medicine

## 2010-12-01 VITALS — BP 128/72 | HR 89 | Resp 18 | Ht 62.0 in | Wt 267.1 lb

## 2010-12-01 DIAGNOSIS — M79609 Pain in unspecified limb: Secondary | ICD-10-CM

## 2010-12-01 DIAGNOSIS — E669 Obesity, unspecified: Secondary | ICD-10-CM

## 2010-12-01 DIAGNOSIS — I10 Essential (primary) hypertension: Secondary | ICD-10-CM

## 2010-12-01 DIAGNOSIS — M79672 Pain in left foot: Secondary | ICD-10-CM | POA: Insufficient documentation

## 2010-12-01 DIAGNOSIS — E785 Hyperlipidemia, unspecified: Secondary | ICD-10-CM

## 2010-12-01 MED ORDER — KETOROLAC TROMETHAMINE 60 MG/2ML IJ SOLN
60.0000 mg | Freq: Once | INTRAMUSCULAR | Status: AC
  Administered 2010-12-01: 60 mg via INTRAMUSCULAR

## 2010-12-01 NOTE — Patient Instructions (Signed)
F/U in 4 weeks  You will get a toradol injection for left foot pain, and will also be referred for an mRI  Of your left achilees tendon.  congrats on weight loss

## 2010-12-01 NOTE — Progress Notes (Signed)
  Subjective:    Patient ID: Stephanie Cannon, female    DOB: 1970/09/10, 40 y.o.   MRN: [id number]  HPI  2 day h/o left foot and ankle pain, fell and hurt herself, since then has had swelling and pain of left ankle and foot wth difficulty weight bearing. She continues to do extremely well with weight loss as a result of dietary modification on phentermine. Denies any adverse s/e from the medication Review of Systems See HPI Denies recent fever or chills. Denies sinus pressure, nasal congestion, ear pain or sore throat. Denies chest congestion, productive cough or wheezing. Denies chest pains, palpitations and leg swelling Denies abdominal pain, nausea, vomiting,diarrhea or constipation.   Denies dysuria, frequency, hesitancy or incontinence.  Denies depression, anxiety or insomnia. Denies skin break down or rash.        Objective:   Physical Exam Patient alert and oriented and in no cardiopulmonary distress.  HEENT: No facial asymmetry, EOMI, no sinus tenderness,  oropharynx pink and moist.  Neck supple no adenopathy.  Chest: Clear to auscultation bilaterally.  CVS: S1, S2 no murmurs, no S3.  ABD: Soft non tender. Bowel sounds normal.  Ext: No edema  MS: Adequate though reduced  ROM spine, adequate in shoulders, hips and knees.decreased ROM left ankle with tenderness and swelling of left foot, no evidence of rupture of acilees tendon  Skin: Intact, no ulcerations or rash noted.  Psych: Good eye contact, normal affect. Memory intact not anxious or depressed appearing.  CNS: CN 2-12 intact, power, tone and sensation normal throughout.        Assessment & Plan:

## 2010-12-03 ENCOUNTER — Other Ambulatory Visit: Payer: Self-pay

## 2010-12-03 MED ORDER — ALPRAZOLAM 1 MG PO TABS: ORAL_TABLET | ORAL | Status: DC

## 2010-12-12 NOTE — Assessment & Plan Note (Signed)
Anti inflammatory prescribed, final decision taken is if symptoms persist past 1 week to 10 days pt be referred to ortho. A call put in on 12/09 revealed improved but persistent symptoms will refer to ortho in [place],she is aware and agrees

## 2010-12-12 NOTE — Assessment & Plan Note (Signed)
Controlled, no change in medication  

## 2010-12-12 NOTE — Assessment & Plan Note (Signed)
dietary management only at this time

## 2010-12-12 NOTE — Assessment & Plan Note (Signed)
Improved. Pt applauded on succesful weight loss through lifestyle change, and encouraged to continue same. Weight loss goal set for the next several months.  

## 2010-12-17 ENCOUNTER — Other Ambulatory Visit: Payer: Self-pay

## 2010-12-17 ENCOUNTER — Other Ambulatory Visit: Payer: Self-pay | Admitting: Family Medicine

## 2010-12-17 NOTE — Telephone Encounter (Signed)
pls refill x 2 

## 2011-01-05 ENCOUNTER — Ambulatory Visit: Payer: 59 | Admitting: Family Medicine

## 2011-01-18 ENCOUNTER — Encounter: Payer: Self-pay | Admitting: Family Medicine

## 2011-01-19 ENCOUNTER — Encounter: Payer: Self-pay | Admitting: Family Medicine

## 2011-01-19 ENCOUNTER — Ambulatory Visit (INDEPENDENT_AMBULATORY_CARE_PROVIDER_SITE_OTHER): Payer: 59 | Admitting: Family Medicine

## 2011-01-19 VITALS — BP 128/74 | HR 102 | Resp 18 | Ht 62.0 in | Wt 273.1 lb

## 2011-01-19 DIAGNOSIS — H9209 Otalgia, unspecified ear: Secondary | ICD-10-CM

## 2011-01-19 DIAGNOSIS — I10 Essential (primary) hypertension: Secondary | ICD-10-CM

## 2011-01-19 DIAGNOSIS — G5600 Carpal tunnel syndrome, unspecified upper limb: Secondary | ICD-10-CM

## 2011-01-19 DIAGNOSIS — H9201 Otalgia, right ear: Secondary | ICD-10-CM | POA: Insufficient documentation

## 2011-01-19 DIAGNOSIS — J0100 Acute maxillary sinusitis, unspecified: Secondary | ICD-10-CM | POA: Insufficient documentation

## 2011-01-19 DIAGNOSIS — E669 Obesity, unspecified: Secondary | ICD-10-CM

## 2011-01-19 MED ORDER — FLUCONAZOLE 150 MG PO TABS: ORAL_TABLET | ORAL | Status: AC

## 2011-01-19 MED ORDER — SULFAMETHOXAZOLE-TRIMETHOPRIM 800-160 MG PO TABS: 1.0000 | ORAL_TABLET | Freq: Two times a day (BID) | ORAL | Status: AC

## 2011-01-19 NOTE — Patient Instructions (Addendum)
F/U early March/end Feb  You are being treated for acute right maxillary sinusitis. Antibiotic is sent to local pharmacy [place]. Medication for yeast infection , [facility] needed [facility].  Use tylenol or ibuprofen for ear pain, as the infection is treated the pain will improve and go away  Refocus on dietary change to enable  weight loss, and plan to lose 6 pounds by the time you return. I know you can do it !  "My fitness pal" is a free app which essentially counts your caloris for you every day...takes away the guesswork, and if you use it you will lose weight

## 2011-01-23 NOTE — Progress Notes (Signed)
  Subjective:    Patient ID: Stephanie Cannon, female    DOB: 1970/01/13, 41 y.o.   MRN: [id number]  HPI 4 day h/o progressive facial pain with yellow green post nasal drainage form right side of face, also experiencing right ear pain and sore throat. She has had chills and possible low grade fever. She denies chest congestion or cough. She has regressed as far as weight loss is concerned in the past several weeks over the holidays , but she intends to re focus and succeed   Review of Systems See HPI Denies chest pains, palpitations and leg swelling Denies abdominal pain, nausea, vomiting,diarrhea or constipation.   Denies dysuria, frequency, hesitancy or incontinence. Denies joint pain, swelling and limitation in mobility. Denies headaches, seizures, numbness, or tingling. Denies depression, anxiety or insomnia. Denies skin break down or rash.        Objective:   Physical Exam Patient alert and oriented and in no cardiopulmonary distress.  HEENT: No facial asymmetry, EOMI, right maxillary sinus tenderness,  oropharynx pink and moist.  Neck supple right anterior cervical adenopathy.  Chest: Clear to auscultation bilaterally.  CVS: S1, S2 no murmurs, no S3.  ABD: Soft non tender. Bowel sounds normal.  Ext: No edema  MS: Adequate ROM spine, shoulders, hips and knees.  Skin: Intact, no ulcerations or rash noted.  Psych: Good eye contact, normal affect. Memory intact not anxious or depressed appearing.  CNS: CN 2-12 intact, power, tone and sensation normal throughout.        Assessment & Plan:

## 2011-01-23 NOTE — Assessment & Plan Note (Signed)
Acute episode, antibiotic course prescribed 

## 2011-01-23 NOTE — Assessment & Plan Note (Signed)
Deteriorated. Patient re-educated about  the importance of commitment to a  minimum of 150 minutes of exercise per week. The importance of healthy food choices with portion control discussed. Encouraged to start a food diary, count calories and to consider  joining a support group. Sample diet sheets offered. Goals set by the patient for the next several months.    

## 2011-01-23 NOTE — Assessment & Plan Note (Signed)
Improved, had surgery in 2012

## 2011-01-23 NOTE — Assessment & Plan Note (Signed)
Controlled, no change in medication  

## 2011-01-23 NOTE — Assessment & Plan Note (Signed)
Referred pai from acute sinus infection, should improve with clearing of sinus infection

## 2011-01-25 ENCOUNTER — Ambulatory Visit: Payer: 59 | Admitting: Family Medicine

## 2011-02-24 ENCOUNTER — Telehealth: Payer: Self-pay | Admitting: Family Medicine

## 2011-02-24 ENCOUNTER — Emergency Department (HOSPITAL_COMMUNITY): Payer: 59

## 2011-02-24 ENCOUNTER — Encounter (HOSPITAL_COMMUNITY): Payer: Self-pay

## 2011-02-24 ENCOUNTER — Emergency Department (HOSPITAL_COMMUNITY)
Admission: EM | Admit: 2011-02-24 | Discharge: 2011-02-24 | Disposition: A | Discharge status: Home Health | Payer: 59 | Attending: Emergency Medicine | Admitting: Emergency Medicine

## 2011-02-24 DIAGNOSIS — R112 Nausea with vomiting, unspecified: Secondary | ICD-10-CM | POA: Insufficient documentation

## 2011-02-24 DIAGNOSIS — R197 Diarrhea, unspecified: Secondary | ICD-10-CM | POA: Insufficient documentation

## 2011-02-24 DIAGNOSIS — I10 Essential (primary) hypertension: Secondary | ICD-10-CM | POA: Insufficient documentation

## 2011-02-24 DIAGNOSIS — E669 Obesity, unspecified: Secondary | ICD-10-CM | POA: Insufficient documentation

## 2011-02-24 DIAGNOSIS — G8929 Other chronic pain: Secondary | ICD-10-CM | POA: Insufficient documentation

## 2011-02-24 DIAGNOSIS — K219 Gastro-esophageal reflux disease without esophagitis: Secondary | ICD-10-CM | POA: Insufficient documentation

## 2011-02-24 DIAGNOSIS — Z8739 Personal history of other diseases of the musculoskeletal system and connective tissue: Secondary | ICD-10-CM | POA: Insufficient documentation

## 2011-02-24 MED ORDER — ONDANSETRON HCL 4 MG/2ML IJ SOLN
INTRAMUSCULAR | Status: AC
  Administered 2011-02-24: 4 mg
  Filled 2011-02-24: qty 2

## 2011-02-24 MED ORDER — SODIUM CHLORIDE 0.9 % IV BOLUS (SEPSIS)
1000.0000 mL | Freq: Once | INTRAVENOUS | Status: AC
  Administered 2011-02-24: 1000 mL via INTRAVENOUS

## 2011-02-24 MED ORDER — ONDANSETRON 8 MG PO TBDP: 8.0000 mg | ORAL_TABLET | Freq: Three times a day (TID) | ORAL | Status: AC | PRN

## 2011-02-24 MED ORDER — HYDROMORPHONE HCL PF 1 MG/ML IJ SOLN
1.0000 mg | Freq: Once | INTRAMUSCULAR | Status: AC
  Administered 2011-02-24: 1 mg via INTRAVENOUS
  Filled 2011-02-24: qty 1

## 2011-02-24 MED ORDER — DIPHENOXYLATE-ATROPINE 2.5-0.025 MG PO TABS
2.0000 | ORAL_TABLET | Freq: Once | ORAL | Status: AC
  Administered 2011-02-24: 2 via ORAL
  Filled 2011-02-24: qty 2

## 2011-02-24 NOTE — ED Notes (Signed)
After multiple attempts, lab unable to draw blood.  edp notified and ordered to hold labs at this time.  Given ginger ale with ice per pt request.  Pt sipping without difficulty.  nad noted.

## 2011-02-24 NOTE — Discharge Instructions (Signed)
Diet for Diarrhea, Adult Having frequent, runny stools (diarrhea) has many causes. Diarrhea may be caused or worsened by food or drink. Diarrhea may be relieved by changing your diet. IF YOU ARE NOT TOLERATING SOLID FOODS:  Drink enough water and fluids to keep your urine clear or pale yellow.   Avoid sugary drinks and sodas as well as milk-based beverages.   Avoid beverages containing caffeine and alcohol.   You may try rehydrating beverages. You can make your own by following this recipe:    tsp table salt.    tsp baking soda.   ? tsp salt substitute (potassium chloride).   1 tbs + 1 tsp sugar.   1 qt water.  As your stools become more solid, you can start eating solid foods. Add foods one at a time. If a certain food causes your diarrhea to get worse, avoid that food and try other foods. A low fiber, low-fat, and lactose-free diet is recommended. Small, frequent meals may be better tolerated.  Starches  Allowed:  White, French, and pita breads, plain rolls, buns, bagels. Plain muffins, matzo. Soda, saltine, or graham crackers. Pretzels, melba toast, zwieback. Cooked cereals made with water: cornmeal, farina, cream cereals. Dry cereals: refined corn, wheat, rice. Potatoes prepared any way without skins, refined macaroni, spaghetti, noodles, refined rice.   Avoid:  Bread, rolls, or crackers made with whole wheat, multi-grains, rye, bran seeds, nuts, or coconut. Corn tortillas or taco shells. Cereals containing whole grains, multi-grains, bran, coconut, nuts, or raisins. Cooked or dry oatmeal. Coarse wheat cereals, granola. Cereals advertised as "high-fiber." Potato skins. Whole grain pasta, wild or brown rice. Popcorn. Sweet potatoes/yams. Sweet rolls, doughnuts, waffles, pancakes, sweet breads.  Vegetables  Allowed: Strained tomato and vegetable juices. Most well-cooked and canned vegetables without seeds. Fresh: Tender lettuce, cucumber without the skin, cabbage, spinach, bean  sprouts.   Avoid: Fresh, cooked, or canned: Artichokes, baked beans, beet greens, broccoli, Brussels sprouts, corn, kale, legumes, peas, sweet potatoes. Cooked: Green or red cabbage, spinach. Avoid large servings of any vegetables, because vegetables shrink when cooked, and they contain more fiber per serving than fresh vegetables.  Fruit  Allowed: All fruit juices except prune juice. Cooked or canned: Apricots, applesauce, cantaloupe, cherries, fruit cocktail, grapefruit, grapes, kiwi, mandarin oranges, peaches, pears, plums, watermelon. Fresh: Apples without skin, ripe banana, grapes, cantaloupe, cherries, grapefruit, peaches, oranges, plums. Keep servings limited to  cup or 1 piece.   Avoid: Fresh: Apple with skin, apricots, mango, pears, raspberries, strawberries. Prune juice, stewed or dried prunes. Dried fruits, raisins, dates. Large servings of all fresh fruits.  Meat and Meat Substitutes  Allowed: Ground or well-cooked tender beef, ham, veal, lamb, pork, or poultry. Eggs, plain cheese. Fish, oysters, shrimp, lobster, other seafoods. Liver, organ meats.   Avoid: Tough, fibrous meats with gristle. Peanut butter, smooth or chunky. Cheese, nuts, seeds, legumes, dried peas, beans, lentils.  Milk  Allowed: Yogurt, lactose-free milk, kefir, drinkable yogurt, buttermilk, soy milk.   Avoid: Milk, chocolate milk, beverages made with milk, such as milk shakes.  Soups  Allowed: Bouillon, broth, or soups made from allowed foods. Any strained soup.   Avoid: Soups made from vegetables that are not allowed, cream or milk-based soups.  Desserts and Sweets  Allowed: Sugar-free gelatin, sugar-free frozen ice pops made without sugar alcohol.   Avoid: Plain cakes and cookies, pie made with allowed fruit, pudding, custard, cream pie. Gelatin, fruit, ice, sherbet, frozen ice pops. Ice cream, ice milk without nuts. Plain hard candy,   honey, jelly, molasses, syrup, sugar, chocolate syrup, gumdrops,  marshmallows.  Fats and Oils  Allowed: Avoid any fats and oils.   Avoid: Seeds, nuts, olives, avocados. Margarine, butter, cream, mayonnaise, salad oils, plain salad dressings made from allowed foods. Plain gravy, crisp bacon without rind.  Beverages  Allowed: Water, decaffeinated teas, oral rehydration solutions, sugar-free beverages.   Avoid: Fruit juices, caffeinated beverages (coffee, tea, soda or pop), alcohol, sports drinks, or lemon-lime soda or pop.  Condiments  Allowed: Ketchup, mustard, horseradish, vinegar, cream sauce, cheese sauce, cocoa powder. Spices in moderation: allspice, basil, bay leaves, celery powder or leaves, cinnamon, cumin powder, curry powder, ginger, mace, marjoram, onion or garlic powder, oregano, paprika, parsley flakes, ground pepper, rosemary, sage, savory, tarragon, thyme, turmeric.   Avoid: Coconut, honey.  Weight Monitoring: Weigh yourself every day. You should weigh yourself in the morning after you urinate and before you eat breakfast. Wear the same amount of clothing when you weigh yourself. Record your weight daily. Bring your recorded weights to your clinic visits. Tell your caregiver right away if you have gained 3 lb/1.4 kg or more in 1 day, 5 lb/2.3 kg in a week, or whatever amount you were told to report. SEEK IMMEDIATE MEDICAL CARE IF:   You are unable to keep fluids down.   You start to throw up (vomit) or diarrhea keeps coming back (persistent).   Abdominal pain develops, increases, or can be felt in one place (localizes).   You have an oral temperature above 102 F (38.9 C), not controlled by medicine.   Diarrhea contains blood or mucus.   You develop excessive weakness, dizziness, fainting, or extreme thirst.  MAKE SURE YOU:   Understand these instructions.   Will watch your condition.   Will get help right away if you are not doing well or get worse.  Document Released: 03/12/2003 Document Revised: 09/01/2010 Document Reviewed:  07/03/2008 [facility] Patient Information 2012 [place], [place].Clear Liquid Diet The clear liquid dietconsists of foods that are liquid or will become liquid at room temperature.You should be able to see through the liquid and beverages. Examples of foods allowed on a clear liquid diet include fruit juice, broth or bouillon, gelatin, or frozen ice pops. The purpose of this diet is to provide necessary fluid, electrolytes such a sodium and potassium, and energy to keep the body functioning during times when you are not able to consume a regular diet.A clear liquid diet should not be continued for long periods of time as it is not nutritionally adequate.  REASONS FOR USING A CLEAR LIQUID DIET  In sudden onset (acute) conditions for a patient before or after surgery.   As the first step in oral feeding.   For fluid and electrolyte replacement in diarrheal diseases.   As a diet before certain medical tests are performed.  ADEQUACY The clear liquid diet is adequate only in ascorbic acid, according to the Recommended Dietary Allowances of the [employer]. CHOOSING FOODS Breads and Starches  Allowed:  None are allowed.   Avoid: All are avoided.  Vegetables  Allowed:  Strained tomato or vegetable juice.   Avoid: Any others.  Fruit  Allowed:  Strained fruit juices and fruit drinks. Include 1 serving of citrus or vitamin C-enriched fruit juice daily.   Avoid: Any others.  Meat and Meat Substitutes  Allowed:  None are allowed.   Avoid: All are avoided.  Milk  Allowed:  None are allowed.   Avoid: All are avoided.  Soups and  Combination Foods  Allowed:  Clear bouillon, broth, or strained broth-based soups.   Avoid: Any others.  Desserts and Sweets  Allowed:  Sugar, honey. High protein gelatin. Flavored gelatin, ices, or frozen ice pops that do not contain milk.   Avoid: Any others.  Fats and Oils  Allowed:  None are allowed.   Avoid: All are avoided.    Beverages  Allowed:  Carbonated beverages, cereal beverages, coffee (regular or decaffeinated), or tea.   Avoid: Any others.  Condiments  Allowed:  Iodized salt.   Avoid: Any others, including pepper.  Supplements  Allowed:  Liquid nutrition beverages.   Avoid: Any others that contain lactose or fiber.  SAMPLE MEAL PLAN Breakfast  4 oz strained orange juice.    to 1 cup gelatin (plain or fortified).   1 cup beverage (coffee or tea).   Sugar, if desired.  Midmorning Snack   cup gelatin (plain or fortified).  Lunch  1 cup broth or consomm.   4 oz strained grapefruit juice.    cup gelatin (plain or fortified).   1 cup beverage (coffee or tea).   Sugar, if desired.  Midafternoon Snack   cup fruit ice.    cup strained fruit juice.  Dinner  1 cup broth or consomm.    cup cranberry juice.    cup flavored gelatin (plain or fortified).   1 cup beverage (coffee or tea).   Sugar, if desired.  Evening Snack  4 oz strained apple juice (vitamin C-fortified).    cup flavored gelatin (plain or fortified).  Document Released: 12/20/2004 Document Revised: 09/01/2010 Document Reviewed: 03/19/2010 [facility] Patient Information 2012 [place], [place].

## 2011-02-24 NOTE — ED Notes (Signed)
Vomited x 1 Sunday,  Has worsened since then, now with diarrhea and upper abd pain

## 2011-02-24 NOTE — Telephone Encounter (Signed)
Called stating pt was lying on the floor , too weak to get up from excessive vomiting and diarreah, needs to take her to the ED for eval, but will need to call the ambulance. I advised if pt is so weak then please do call the ambulance

## 2011-02-24 NOTE — ED Provider Notes (Signed)
History     CSN: [id number]  Arrival date & time 02/24/11  4098   First MD Initiated Contact with Patient 02/24/11 0645      Chief Complaint  Patient presents with  . Nausea    (Consider location/radiation/quality/duration/timing/severity/associated sxs/prior treatment) HPI Stephanie Cannon is a 41 y.o. female brought in by ambulance, who presents to the Emergency Department complaining of nausea, vomiting and diarrhea. She reports having vomiting on Sunday x 1. Has had upper abdominal pain since Sunday with development of diarrhea that became more frequent in the last two days. Vomiting started again last night at 8PM and was continuous thoughout the night.Patient states that she has loose stools normally since having her gall bladder out but this is diarrhea. Her hemorrhoids are irritate from the diarrhea. She denies fever, chills, cough, chest pain, shortness of breath. She has taken no medicines.   PCP Dr. Lodema Hong    Past Medical History  Diagnosis Date  . Obesity   . Carpal tunnel syndrome   . Hypertension   . GERD (gastroesophageal reflux disease)   . Chronic back pain   . Arthritis     Past Surgical History  Procedure Date  . Abdominal hysterectomy   . Carpal tunnel release   . Cholecystectomy   . Tonsillectomy     Family History  Problem Relation Age of Onset  . Hypertension Mother   . Hypertension Father   . Hypertension Brother   . Cancer Maternal Aunt   . Cancer Maternal Uncle     liver  . Cancer Paternal Uncle     bone cancer  . Diabetes Maternal Grandmother     History  Substance Use Topics  . Smoking status: Former Games developer  . Smokeless tobacco: Not on file  . Alcohol Use: Yes     occasionally    OB History    Grav Para Term Preterm Abortions TAB SAB Ect Mult Living                  Review of Systems  A 10 review of systems reviewed and are negative for acute change except as noted in the HPI. Allergies  Codeine and Morphine  Home  Medications   Current Outpatient Rx  Name Route Sig Dispense Refill  . ALPRAZOLAM 1 MG PO TABS  TAKE 1 TABLET BY MOUTH AT BEDTIME AS NEEDED 90 tablet 2  . CYCLOBENZAPRINE HCL 10 MG PO TABS Oral Take 1 tablet (10 mg total) by mouth 2 (two) times daily as needed for muscle spasms. 180 tablet 2  . ESTROGENS CONJ SYNTHETIC B 1.25 MG PO TABS Oral Take 1.25 mg by mouth daily.    Marland Kitchen NEXIUM 40 MG PO CPDR  TAKE 1 CAPSULE BY MOUTH DAILY. 90 capsule 2  . PHENTERMINE HCL 37.5 MG PO TABS  TAKE 1 TABLET BY MOUTH DAILY BEFORE BREAKFAST 30 tablet 2  . TRIAMTERENE-HCTZ 37.5-25 MG PO TABS  Dose increase to one and a half tablets once daily , effective 09/07/2010 45 tablet 11    BP 152/73  Pulse 80  Temp(Src) 97.7 F (36.5 C) (Oral)  Resp 20  Ht 5\' 1"  (1.549 m)  Wt 250 lb (113.399 kg)  BMI 47.24 kg/m2  SpO2 100%  Physical Exam Physical examination:  Nursing notes reviewed; Vital signs and O2 SAT reviewed;  Constitutional: Morbidly obese, Well developed, Well nourished, Well hydrated, In no acute distress; Head:  Normocephalic, atraumatic; Eyes: EOMI, PERRL, No scleral icterus; ENMT: Mouth and pharynx  normal, Mucous membranes moist; Neck: Supple, Full range of motion, No lymphadenopathy; Cardiovascular: Regular rate and rhythm, No murmur, rub, or gallop; Respiratory: Breath sounds clear & equal bilaterally, No rales, rhonchi, wheezes, or rub, Normal respiratory effort/excursion; Chest: Nontender, Movement normal; Abdomen: Soft, mild epigastric tenderness, Nondistended, Normal bowel sounds; no guarding, no rebound Genitourinary: No CVA tenderness; Extremities: Pulses normal, No tenderness, No edema, No calf edema or asymmetry.; Neuro: AA&Ox3, Major CN grossly intact.  No gross focal motor or sensory deficits in extremities.; Skin: Color normal, Warm, Dry  ED Course  Procedures (including critical care time)    MDM  Patient with nausea, vomiting and diarrhea. Given IVF, antiemetic, lomotil, analgesic.  0700  Care/disposition to Dr. Bebe Shaggy.  MDM Reviewed: nursing note and vitals           Nicoletta Dress. Colon Branch, MD 02/24/11 5621

## 2011-02-24 NOTE — ED Notes (Signed)
Pt feeling better.  No active vomiting.  Pt has drank 2 ginger ales and tolerated well.  nad noted.

## 2011-02-24 NOTE — ED Provider Notes (Signed)
Patient improved Taking PO BP 120/79  Pulse 85  Temp(Src) 97.7 F (36.5 C) (Oral)  Resp 18  Ht 5\' 1"  (1.549 m)  Wt 250 lb (113.399 kg)  BMI 47.24 kg/m2  SpO2 98% Xray negative Unable to obtain labs, but she is improved, taking PO, labs cancelled Feel she is safe for d/c - abd soft, she is nontoxic in appearance Discussed need for f/u if diarrhea persists over next week   Joya Gaskins, MD 02/24/11 0900

## 2011-03-01 ENCOUNTER — Encounter: Payer: Self-pay | Admitting: Family Medicine

## 2011-03-01 ENCOUNTER — Ambulatory Visit (INDEPENDENT_AMBULATORY_CARE_PROVIDER_SITE_OTHER): Payer: 59 | Admitting: Family Medicine

## 2011-03-01 VITALS — BP 120/80 | HR 64 | Resp 16 | Wt 267.0 lb

## 2011-03-01 DIAGNOSIS — I10 Essential (primary) hypertension: Secondary | ICD-10-CM

## 2011-03-01 DIAGNOSIS — F419 Anxiety disorder, unspecified: Secondary | ICD-10-CM | POA: Insufficient documentation

## 2011-03-01 DIAGNOSIS — F329 Major depressive disorder, single episode, unspecified: Secondary | ICD-10-CM

## 2011-03-01 DIAGNOSIS — E669 Obesity, unspecified: Secondary | ICD-10-CM

## 2011-03-01 DIAGNOSIS — F3289 Other specified depressive episodes: Secondary | ICD-10-CM

## 2011-03-01 DIAGNOSIS — F411 Generalized anxiety disorder: Secondary | ICD-10-CM

## 2011-03-01 MED ORDER — PAROXETINE HCL 10 MG PO TABS: 10.0000 mg | ORAL_TABLET | ORAL | Status: DC

## 2011-03-01 NOTE — Patient Instructions (Signed)
F/u in 6 weeks.  You need to call the number we will provide for counseling.  STOP phentermine.  Start paxil one daily for anxiety and stress.  Plan to do 30 minutes every evening of physical activity that you enjoy and can manage for stress relief.  If palpitations persist or worsen go to Ed, and call me so I can refer youto cardiology.  I believe these symptoms are all due to stress

## 2011-03-01 NOTE — Progress Notes (Signed)
  Subjective:    Patient ID: Stephanie Cannon, female    DOB: November 10, 1970, 41 y.o.   MRN: [id number]  HPI Pt in today c/o being overwhelmed and stressed. I have advised counseling through employee assistance. C/o palpitations intermittently x 3 weeks, ever since she had a bad report from the job,, probably 3 times per week,generally on the job.No associated chest pain , light headedness , pND, orthopnea or leg swelling This past Sunday she experienced palpitations at night also associated with the anxiety of thinking about work the next day. Crying episodes daily and more social withdrawal, overwhelmed, and increased difficulty with task completion.Not suicidal or homicidal. Has not been exercising and has been unable to focus on dietary change to promote weight loss, unfortunately she has gained weight Recently treated in the ED for acute gastroenteritis which has resolved   Review of Systems See HPI Denies recent fever or chills. Denies sinus pressure, nasal congestion, ear pain or sore throat. Denies chest congestion, productive cough or wheezing. Denies chest pains, palpitations and leg swelling Denies abdominal pain, nausea, vomiting,diarrhea or constipation.   Denies dysuria, frequency, hesitancy or incontinence. Denies headaches, seizures, numbness, or tingling.  Denies skin break down or rash.        Objective:   Physical Exam Patient alert and oriented and in no cardiopulmonary distress.  HEENT: No facial asymmetry, EOMI, no sinus tenderness,  oropharynx pink and moist.  Neck supple no adenopathy.  Chest: Clear to auscultation bilaterally.  CVS: S1, S2 no murmurs, no S3.  ABD: Soft non tender. Bowel sounds normal.  Ext: No edema  MS: Adequate ROM spine, shoulders, hips and knees.  Skin: Intact, no ulcerations or rash noted.  Psych: Good eye contact, tearful. Memory intact  both anxious and  depressed appearing.  CNS: CN 2-12 intact, power, tone and sensation  normal throughout.        Assessment & Plan:

## 2011-03-06 DIAGNOSIS — F329 Major depressive disorder, single episode, unspecified: Secondary | ICD-10-CM | POA: Insufficient documentation

## 2011-03-06 DIAGNOSIS — F3289 Other specified depressive episodes: Secondary | ICD-10-CM | POA: Insufficient documentation

## 2011-03-06 NOTE — Assessment & Plan Note (Signed)
Increased and uncontroled due to work stress

## 2011-03-06 NOTE — Assessment & Plan Note (Signed)
Depression related to discontent on the job and stress. Not suicidal or homicidal. Advised pt to seek counseling through employee services and med started

## 2011-03-06 NOTE — Assessment & Plan Note (Signed)
Controlled, no change in medication  

## 2011-03-06 NOTE — Assessment & Plan Note (Signed)
Deteriorated. Patient re-educated about  the importance of commitment to a  minimum of 150 minutes of exercise per week. The importance of healthy food choices with portion control discussed. Encouraged to start a food diary, count calories and to consider  joining a support group. Sample diet sheets offered. Goals set by the patient for the next several months.    

## 2011-03-10 ENCOUNTER — Telehealth: Payer: Self-pay | Admitting: Family Medicine

## 2011-03-10 NOTE — Telephone Encounter (Signed)
Give me rough draft and I will type

## 2011-03-11 NOTE — Telephone Encounter (Signed)
Letter written

## 2011-03-15 NOTE — Telephone Encounter (Signed)
Will type

## 2011-03-24 ENCOUNTER — Ambulatory Visit (INDEPENDENT_AMBULATORY_CARE_PROVIDER_SITE_OTHER): Payer: 59 | Admitting: General Surgery

## 2011-04-08 ENCOUNTER — Ambulatory Visit (INDEPENDENT_AMBULATORY_CARE_PROVIDER_SITE_OTHER): Payer: 59 | Admitting: General Surgery

## 2011-04-14 ENCOUNTER — Ambulatory Visit (INDEPENDENT_AMBULATORY_CARE_PROVIDER_SITE_OTHER): Payer: 59 | Admitting: Family Medicine

## 2011-04-14 ENCOUNTER — Encounter: Payer: Self-pay | Admitting: Family Medicine

## 2011-04-14 VITALS — BP 114/74 | HR 94 | Resp 16 | Ht 62.0 in | Wt 278.1 lb

## 2011-04-14 DIAGNOSIS — F329 Major depressive disorder, single episode, unspecified: Secondary | ICD-10-CM

## 2011-04-14 DIAGNOSIS — E785 Hyperlipidemia, unspecified: Secondary | ICD-10-CM

## 2011-04-14 DIAGNOSIS — I10 Essential (primary) hypertension: Secondary | ICD-10-CM

## 2011-04-14 DIAGNOSIS — E669 Obesity, unspecified: Secondary | ICD-10-CM

## 2011-04-14 DIAGNOSIS — R600 Localized edema: Secondary | ICD-10-CM

## 2011-04-14 DIAGNOSIS — R7301 Impaired fasting glucose: Secondary | ICD-10-CM

## 2011-04-14 DIAGNOSIS — F3289 Other specified depressive episodes: Secondary | ICD-10-CM

## 2011-04-14 DIAGNOSIS — R609 Edema, unspecified: Secondary | ICD-10-CM

## 2011-04-14 DIAGNOSIS — F419 Anxiety disorder, unspecified: Secondary | ICD-10-CM

## 2011-04-14 DIAGNOSIS — F411 Generalized anxiety disorder: Secondary | ICD-10-CM

## 2011-04-14 NOTE — Patient Instructions (Signed)
F/u in 2 month  Please start 30 minutes of physical activity 5 days per week.  I do recommend surgical intervention for weight loss.   hBA1c and chem 7 today    Use the rectal suppositories for 3 to 5 days continually for your hemmorhoids

## 2011-04-14 NOTE — Progress Notes (Signed)
  Subjective:    Patient ID: Stephanie Cannon, female    DOB: 05/14/1970, 41 y.o.   MRN: [id number]  HPI The PT is here for follow up and re-evaluation of chronic medical conditions, medication management and review of any available recent lab and radiology data.  Preventive health is updated, specifically  Cancer screening and Immunization.   Questions or concerns regarding consultations or procedures which the PT has had in the interim are  addressed. The PT denies any adverse reactions to current medications since the last visit.  C/o inability to lose weight and she is becoming increasingly frustrated with her attempts and has decided to pursue surgical option. Depression and anxiety have improved on medication which she intends to continue. No plan to go for counseling and no longer feels the need to. C/o uncontrolled hemorhoids, has frequent bowel movements , and with wiping has some irritation and blood. Refuses rectal exam, not using topical steroid suppositories as prescribed to shrink the hemmorhoids    Review of Systems See HPI Denies recent fever or chills. Denies sinus pressure, nasal congestion, ear pain or sore throat. Denies chest congestion, productive cough or wheezing. Denies chest pains, palpitations and leg swelling Denies abdominal pain, nausea, vomiting,diarrhea or constipation.   Denies dysuria, frequency, hesitancy or incontinence. Chronic back pain unchanged Denies headaches, seizures, numbness, or tingling. Denies uncontrolled depression, anxiety or insomnia. Denies skin break down or rash.        Objective:   Physical Exam Patient alert and oriented and in no cardiopulmonary distress.  HEENT: No facial asymmetry, EOMI, no sinus tenderness,  oropharynx pink and moist.  Neck supple no adenopathy.  Chest: Clear to auscultation bilaterally.  CVS: S1, S2 no murmurs, no S3.  ABD: Soft non tender. Bowel sounds normal.  Ext: No edema  MS: Adequate ROM  spine, shoulders, hips and knees.  Skin: Intact, no ulcerations or rash noted.  Psych: Good eye contact, normal affect. Memory intact not anxious or depressed appearing.  CNS: CN 2-12 intact, power, tone and sensation normal throughout.       Assessment & Plan:

## 2011-04-15 LAB — BASIC METABOLIC PANEL
BUN: 13 mg/dL (ref 6–23)
Calcium: 9.5 mg/dL (ref 8.4–10.5)
Chloride: 99 mEq/L (ref 96–112)
Creat: 0.78 mg/dL (ref 0.50–1.10)

## 2011-04-15 LAB — HEMOGLOBIN A1C: Hgb A1c MFr Bld: 5.7 % — ABNORMAL HIGH (ref <5.7)

## 2011-04-17 NOTE — Assessment & Plan Note (Signed)
Hyperlipidemia:Low fat diet discussed and encouraged.  Pt encouraged to commit to regular exercise and weight loss

## 2011-04-17 NOTE — Assessment & Plan Note (Addendum)
Deteriorated. Patient re-educated about  the importance of commitment to a  minimum of 150 minutes of exercise per week. The importance of healthy food choices with portion control discussed. Encouraged to start a food diary, count calories and to consider  joining a support group. Pt has upcoming appointment to discuss bariatric surgery. Over the years despite repeated efforts her weight has continually escalated uward

## 2011-04-17 NOTE — Assessment & Plan Note (Signed)
Improved on medication °

## 2011-04-17 NOTE — Assessment & Plan Note (Signed)
Controlled, no change in medication  

## 2011-04-17 NOTE — Assessment & Plan Note (Signed)
Controlled and improved on medication, no interest in psychotherapy, however noted improvement in coping with stress on medication

## 2011-04-17 NOTE — Assessment & Plan Note (Signed)
resolved 

## 2011-04-20 ENCOUNTER — Ambulatory Visit (INDEPENDENT_AMBULATORY_CARE_PROVIDER_SITE_OTHER): Payer: 59 | Admitting: General Surgery

## 2011-04-20 ENCOUNTER — Encounter (INDEPENDENT_AMBULATORY_CARE_PROVIDER_SITE_OTHER): Payer: Self-pay | Admitting: General Surgery

## 2011-04-20 DIAGNOSIS — K219 Gastro-esophageal reflux disease without esophagitis: Secondary | ICD-10-CM

## 2011-04-20 DIAGNOSIS — I10 Essential (primary) hypertension: Secondary | ICD-10-CM

## 2011-04-20 DIAGNOSIS — E785 Hyperlipidemia, unspecified: Secondary | ICD-10-CM

## 2011-04-20 NOTE — Progress Notes (Signed)
Subjective:   Stephanie Cannon  Patient ID: Stephanie Cannon, female   DOB: [birth year], 41 y.o.   MRN: [id number]  HPI Stephanie Cannon [id number] y.o.female presents for consideration for surgical treatment for Stephanie Cannon, referred by Dr Syliva Overman.  she gives a history of progressive Cannon since hildhood with more progressive and rapid weight gain in the last 6 or 7 years despite multiple attempts at medical management. She has been able to lose up to about 20 pounds at a time but did experience his progressive weight regain. her weight has been affecting her in a number of ways including chronic low back pain and increasing difficulties with routine activities of daily living  She is very concerned about her long-term health going for her current weight. She is currently treated for hypertension as well... she has been to our initial information seminar, researched surgical options thoroughly and is interested in Lap band surgery due to the safety profile and less invasive nature of the surgery.  Past Medical History  Diagnosis Date  . Cannon   . Carpal tunnel syndrome   . Hypertension   . GERD (gastroesophageal reflux disease)   . Chronic back pain   . Arthritis   . Hyperlipidemia    Past Surgical History  Procedure Date  . Abdominal hysterectomy   . Carpal tunnel release   . Cholecystectomy   . Tonsillectomy    Current Outpatient Prescriptions  Medication Sig Dispense Refill  . ALPRAZolam (XANAX) 1 MG tablet Take 1 mg by mouth at bedtime as needed. TAKE 1 TABLET BY MOUTH AT BEDTIME AS NEEDED      . cyclobenzaprine (FLEXERIL) 10 MG tablet Take 10 mg by mouth at bedtime as needed.      Marland Kitchen estrogens conjugated, synthetic B, (ENJUVIA) 1.25 MG tablet Take 1.25 mg by mouth daily.      Marland Kitchen NEXIUM 40 MG capsule TAKE 1 CAPSULE BY MOUTH DAILY.  90 capsule  2  . PARoxetine (PAXIL) 10 MG tablet Take 1 tablet (10 mg total) by mouth every morning.  90 tablet  1  . triamterene-hydrochlorothiazide  (MAXZIDE-25) 37.5-25 MG per tablet Dose increase to one and a half tablets once daily , effective 09/07/2010  45 tablet  11   Allergies  Allergen Reactions  . Codeine     REACTION: itch  . Morphine     REACTION: itch   History  Substance Use Topics  . Smoking status: Former Games developer  . Smokeless tobacco: Not on file  . Alcohol Use: Yes     occasionally    Review of Systems  Constitutional: Negative.   HENT: Negative.   Respiratory: Negative.   Cardiovascular: Negative.   Gastrointestinal: Negative.        GERD  Musculoskeletal: Positive for back pain.  Skin: Negative.        Objective:   Physical Exam General: Alert, morbidly obese African American female, in no distress Skin: Warm and dry without rash or infection. HEENT: No palpable masses or thyromegaly. Sclera nonicteric. Pupils equal round and reactive. Oropharynx clear. Lymph nodes: No cervical, supraclavicular, or inguinal nodes palpable. Lungs: Breath sounds clear and equal without increased work of breathing Cardiovascular: Regular rate and rhythm without murmur. No JVD or edema. Peripheral pulses intact. Abdomen: Nondistended. Well healed laparoscopic and low midline incisions Soft and nontender. No masses palpable. No organomegaly. No palpable hernias. Extremities: No edema or joint swelling or deformity. No chronic venous stasis changes. Neurologic: Alert and fully oriented. Gait normal.  Assessment:     Patient with progressive Stephanie Cannon unresponsive to multiple efforts at medical management who presents with a BMI of 52.5 and comorbidities of HTN and GERDand chronic back pain. I believe there would be very significant medical benefit from surgical weight loss. After our discussion of surgical options currently available the patient has decided to proceed with LAP-BAND placement due to the reasons above. We have discussed the nature of Stephanie Cannon and the risk of remaining obese. We discussed the  indications for the procedure, its nature, and expected recovery. The risks of the procedure were discussed in detail including anesthetic complications, bleeding, infection, visceral injury, dysphagia, and long-term risks of mechanical failure, slippage, erosion, esophageal dilatation and failure to lose weight. Rare risk of death was discussed. The patient was given a complete consent form and all questions were answered. We will proceed with preoperative workup including routine lab and x-rays, psychologic and nutrition evaluations, and upper GI series.  I will see the patient back following this evaluation.         Plan:     As above

## 2011-04-27 ENCOUNTER — Ambulatory Visit: Payer: Commercial Managed Care - PPO | Admitting: Family Medicine

## 2011-05-13 ENCOUNTER — Ambulatory Visit (HOSPITAL_COMMUNITY)
Admission: RE | Admit: 2011-05-13 | Discharge: 2011-05-13 | Disposition: A | Discharge status: Home / Self Care | Payer: 59 | Source: Ambulatory Visit | Attending: General Surgery | Admitting: General Surgery

## 2011-05-13 ENCOUNTER — Other Ambulatory Visit: Payer: Self-pay

## 2011-05-13 DIAGNOSIS — E785 Hyperlipidemia, unspecified: Secondary | ICD-10-CM

## 2011-05-13 DIAGNOSIS — I10 Essential (primary) hypertension: Secondary | ICD-10-CM

## 2011-05-13 DIAGNOSIS — K219 Gastro-esophageal reflux disease without esophagitis: Secondary | ICD-10-CM

## 2011-05-13 DIAGNOSIS — Z6841 Body Mass Index (BMI) 40.0 and over, adult: Secondary | ICD-10-CM | POA: Insufficient documentation

## 2011-05-13 DIAGNOSIS — M549 Dorsalgia, unspecified: Secondary | ICD-10-CM | POA: Insufficient documentation

## 2011-05-17 ENCOUNTER — Telehealth: Payer: Self-pay | Admitting: Family Medicine

## 2011-05-17 ENCOUNTER — Other Ambulatory Visit: Payer: Self-pay | Admitting: Family Medicine

## 2011-05-17 MED ORDER — DICLOFENAC SODIUM 75 MG PO TBEC: DELAYED_RELEASE_TABLET | ORAL | Status: AC

## 2011-05-17 NOTE — Telephone Encounter (Signed)
Med called in pls let her know

## 2011-05-17 NOTE — Telephone Encounter (Signed)
Not on med list anymore. Ok to refill?

## 2011-05-18 NOTE — Telephone Encounter (Signed)
Message for pt left on voicemail.

## 2011-05-23 ENCOUNTER — Ambulatory Visit: Payer: 59 | Admitting: *Deleted

## 2011-06-01 ENCOUNTER — Encounter: Payer: 59 | Attending: General Surgery | Admitting: *Deleted

## 2011-06-01 ENCOUNTER — Encounter: Payer: Self-pay | Admitting: *Deleted

## 2011-06-01 DIAGNOSIS — Z01818 Encounter for other preprocedural examination: Secondary | ICD-10-CM | POA: Insufficient documentation

## 2011-06-01 DIAGNOSIS — Z713 Dietary counseling and surveillance: Secondary | ICD-10-CM | POA: Insufficient documentation

## 2011-06-01 LAB — CBC WITH DIFFERENTIAL/PLATELET
Basophils Relative: 0 % (ref 0–1)
Eosinophils Absolute: 0.1 10*3/uL (ref 0.0–0.7)
Lymphs Abs: 2.5 10*3/uL (ref 0.7–4.0)
MCH: 28.8 pg (ref 26.0–34.0)
MCHC: 33.5 g/dL (ref 30.0–36.0)
Neutro Abs: 3.7 10*3/uL (ref 1.7–7.7)
Neutrophils Relative %: 56 % (ref 43–77)
Platelets: 267 10*3/uL (ref 150–400)
RBC: 4.97 MIL/uL (ref 3.87–5.11)

## 2011-06-01 LAB — COMPREHENSIVE METABOLIC PANEL
ALT: 19 U/L (ref 0–35)
Alkaline Phosphatase: 68 U/L (ref 39–117)
Sodium: 138 mEq/L (ref 135–145)
Total Bilirubin: 0.3 mg/dL (ref 0.3–1.2)
Total Protein: 7.4 g/dL (ref 6.0–8.3)

## 2011-06-01 NOTE — Progress Notes (Addendum)
  Pre-Op Assessment Visit:  Pre-Operative LAGB Surgery  Medical Nutrition Therapy:  Appt start time: 0800   End time:  0900.  Patient was seen on 06/01/2011 for Pre-Operative LAGB Nutrition Assessment. Assessment and letter of approval faxed to [facility] Program coordinator on 06/01/2011.  Approval letter sent to [facility] and will be available in the chart under the media tab.  TANITA  BODY COMP RESULTS  06/01/11   %Fat 52.5%   Fat Mass (lbs) 149.5   Fat Free Mass (lbs) 135.5   Total Body Water (lbs) 99.0   Handouts given during visit include:  Pre-Op Goals   Bariatric Surgery Protein Shakes  Samples dispensed at visit include:   Unjury Protein Powder (Vanilla): 1 pkt - Lot # [id number]; Exp: 08/14  Patient to call for Pre-Op and Post-Op Nutrition Education at the [facility] when surgery is scheduled.

## 2011-06-01 NOTE — Patient Instructions (Signed)
   Follow Pre-Op Nutrition Goals to prepare for Lap Band Surgery.   Call the [facility] at [phone] once you have been given your surgery date to enrolled in the Pre-Op Nutrition Class. You will need to attend this nutrition class 3-4 weeks prior to your surgery.  

## 2011-06-02 ENCOUNTER — Ambulatory Visit: Payer: Self-pay | Admitting: *Deleted

## 2011-06-03 ENCOUNTER — Ambulatory Visit (INDEPENDENT_AMBULATORY_CARE_PROVIDER_SITE_OTHER): Payer: 59 | Admitting: General Surgery

## 2011-06-08 ENCOUNTER — Ambulatory Visit: Payer: 59 | Admitting: Family Medicine

## 2011-06-16 ENCOUNTER — Ambulatory Visit (INDEPENDENT_AMBULATORY_CARE_PROVIDER_SITE_OTHER): Payer: 59 | Admitting: Family Medicine

## 2011-06-16 ENCOUNTER — Encounter: Payer: Self-pay | Admitting: Family Medicine

## 2011-06-16 VITALS — BP 120/72 | HR 91 | Resp 18 | Ht 62.0 in | Wt 282.1 lb

## 2011-06-16 DIAGNOSIS — E669 Obesity, unspecified: Secondary | ICD-10-CM

## 2011-06-16 DIAGNOSIS — F3289 Other specified depressive episodes: Secondary | ICD-10-CM

## 2011-06-16 DIAGNOSIS — I10 Essential (primary) hypertension: Secondary | ICD-10-CM

## 2011-06-16 DIAGNOSIS — F329 Major depressive disorder, single episode, unspecified: Secondary | ICD-10-CM

## 2011-06-16 DIAGNOSIS — M549 Dorsalgia, unspecified: Secondary | ICD-10-CM

## 2011-06-16 MED ORDER — PHENTERMINE HCL 37.5 MG PO TABS: 37.5000 mg | ORAL_TABLET | Freq: Every day | ORAL | Status: DC

## 2011-06-16 MED ORDER — MELOXICAM 15 MG PO TABS: 15.0000 mg | ORAL_TABLET | Freq: Every day | ORAL | Status: DC

## 2011-06-16 NOTE — Patient Instructions (Addendum)
F/u in mid September  HBa1C , fasting chem 7  In September   Consider pre calorie counted foods and salads  Weight loss goal of 4 pounds per month

## 2011-06-16 NOTE — Progress Notes (Signed)
  Subjective:    Patient ID: Stephanie Cannon, female    DOB: 1970-12-03, 41 y.o.   MRN: [id number]  HPI The PT is here for follow up and re-evaluation of chronic medical conditions, medication management and review of any available recent lab and radiology data.  Preventive health is updated, specifically  Cancer screening and Immunization.   Questions or concerns regarding consultations or procedures which the PT has had in the interim are  addressed. The PT denies any adverse reactions to current medications since the last visit.  Requests that she be started back on appetite suppressant, she is working on lifestyle change to facilitate weight loss, no longer considering surgery, too expensive. C/o increased back pain and wants to be resumed on medication for this    Review of Systems See HPI Denies recent fever or chills. Denies sinus pressure, nasal congestion, ear pain or sore throat. Denies chest congestion, productive cough or wheezing. Denies chest pains, palpitations and leg swelling Denies abdominal pain, nausea, vomiting,diarrhea or constipation.   Denies dysuria, frequency, hesitancy or incontinence.  Denies headaches, seizures, numbness, or tingling. Denies depression, anxiety or insomnia. Denies skin break down or rash.        Objective:   Physical Exam  Patient alert and oriented and in no cardiopulmonary distress.  HEENT: No facial asymmetry, EOMI, no sinus tenderness,  oropharynx pink and moist.  Neck supple no adenopathy.  Chest: Clear to auscultation bilaterally.  CVS: S1, S2 no murmurs, no S3.  ABD: Soft non tender. Bowel sounds normal.  Ext: No edema  MS: Adequate though reduced  ROM spine,adequate in  shoulders, hips and knees.  Skin: Intact, no ulcerations or rash noted.  Psych: Good eye contact, normal affect. Memory intact not anxious or depressed appearing.  CNS: CN 2-12 intact, power, tone and sensation normal throughout.         Assessment & Plan:

## 2011-06-19 NOTE — Assessment & Plan Note (Signed)
Improved. Pt applauded on succesful weight loss through lifestyle change, and encouraged to continue same. Weight loss goal set for the next several months.  

## 2011-06-19 NOTE — Assessment & Plan Note (Signed)
Improved and on no medication 

## 2011-06-19 NOTE — Assessment & Plan Note (Signed)
Controlled, no change in medication  

## 2011-06-19 NOTE — Assessment & Plan Note (Signed)
Uncontrolled , med prescribed 

## 2011-08-10 ENCOUNTER — Other Ambulatory Visit: Payer: Self-pay | Admitting: Family Medicine

## 2011-08-30 ENCOUNTER — Other Ambulatory Visit: Payer: Self-pay

## 2011-08-30 MED ORDER — ALPRAZOLAM 1 MG PO TABS: 1.0000 mg | ORAL_TABLET | Freq: Every evening | ORAL | Status: DC | PRN

## 2011-09-22 ENCOUNTER — Ambulatory Visit (INDEPENDENT_AMBULATORY_CARE_PROVIDER_SITE_OTHER): Payer: 59 | Admitting: Family Medicine

## 2011-09-22 ENCOUNTER — Encounter: Payer: Self-pay | Admitting: Family Medicine

## 2011-09-22 VITALS — BP 124/74 | HR 110 | Resp 18 | Ht 62.0 in | Wt 278.1 lb

## 2011-09-22 DIAGNOSIS — R5383 Other fatigue: Secondary | ICD-10-CM

## 2011-09-22 DIAGNOSIS — M549 Dorsalgia, unspecified: Secondary | ICD-10-CM

## 2011-09-22 DIAGNOSIS — E669 Obesity, unspecified: Secondary | ICD-10-CM

## 2011-09-22 DIAGNOSIS — E785 Hyperlipidemia, unspecified: Secondary | ICD-10-CM

## 2011-09-22 DIAGNOSIS — R5381 Other malaise: Secondary | ICD-10-CM

## 2011-09-22 DIAGNOSIS — F419 Anxiety disorder, unspecified: Secondary | ICD-10-CM

## 2011-09-22 DIAGNOSIS — I10 Essential (primary) hypertension: Secondary | ICD-10-CM

## 2011-09-22 DIAGNOSIS — F411 Generalized anxiety disorder: Secondary | ICD-10-CM

## 2011-09-22 MED ORDER — KETOROLAC TROMETHAMINE 60 MG/2ML IJ SOLN
60.0000 mg | Freq: Once | INTRAMUSCULAR | Status: AC
  Administered 2011-09-22: 60 mg via INTRAMUSCULAR

## 2011-09-22 MED ORDER — METHYLPREDNISOLONE ACETATE 80 MG/ML IJ SUSP
80.0000 mg | Freq: Once | INTRAMUSCULAR | Status: AC
  Administered 2011-09-22: 80 mg via INTRAMUSCULAR

## 2011-09-22 NOTE — Patient Instructions (Addendum)
F/u in 3.5 month  HBA1C and chem 7 today.  Call back if you decide on physical therapy for the back, this is helpful, the FMLA form will be completed as discussed    Fasting lipid and chem 7 in 4 months  Toradol 60mg  IM  and depo medrol 80mg  IM in office today   Continue  Phentermine as before

## 2011-09-22 NOTE — Progress Notes (Signed)
  Subjective:    Patient ID: Zollie Scale, female    DOB: 1970-11-02, 41 y.o.   MRN: [id number]  HPI The PT is here for follow up and re-evaluation of chronic medical conditions, medication management and review of any available recent lab and radiology data.  Preventive health is updated, specifically  Cancer screening and Immunization.   C/o increased and uncontrolled mid and low back pain, wants FMLA form completed also in this regard. Has recently been under increased stress poor commitment to diet and physical activity, disappointing weight loss, plans to change this and wants to continue phentermine     Review of Systems See HPI Denies recent fever or chills. Denies sinus pressure, nasal congestion, ear pain or sore throat. Denies chest congestion, productive cough or wheezing. Denies chest pains, palpitations and leg swelling Denies abdominal pain, nausea, vomiting,diarrhea or constipation.   Denies dysuria, frequency, hesitancy or incontinence.  Denies headaches, seizures, numbness, or tingling. Denies depression,has some  anxiety and  Insomnia due to uncontrolled pain Denies skin break down or rash.        Objective:   Physical Exam Patient alert and oriented and in no cardiopulmonary distress.  HEENT: No facial asymmetry, EOMI, no sinus tenderness,  oropharynx pink and moist.  Neck supple no adenopathy.  Chest: Clear to auscultation bilaterally.  CVS: S1, S2 no murmurs, no S3.  ABD: Soft non tender. Bowel sounds normal.  Ext: No edema  MS: decreased  ROM spine,adequate in  shoulders, hips and knees.  Skin: Intact, no ulcerations or rash noted.  Psych: Good eye contact, normal affect. Memory intact not anxious or depressed appearing.  CNS: CN 2-12 intact, power, tone and sensation normal throughout.        Assessment & Plan:

## 2011-09-24 NOTE — Assessment & Plan Note (Signed)
Increased and uncontrolled, ant inflammatories in office today FMLA form to be completed also

## 2011-09-24 NOTE — Assessment & Plan Note (Signed)
Unchanged. Patient re-educated about  the importance of commitment to a  minimum of 150 minutes of exercise per week. The importance of healthy food choices with portion control discussed. Encouraged to start a food diary, count calories and to consider  joining a support group. Sample diet sheets offered. Goals set by the patient for the next several months.    

## 2011-09-24 NOTE — Assessment & Plan Note (Signed)
Recently under increased stress which has improved

## 2011-09-24 NOTE — Assessment & Plan Note (Signed)
Controlled, no change in medication DASH diet and commitment to daily physical activity for a minimum of 30 minutes discussed and encouraged, as a part of hypertension management. The importance of attaining a healthy weight is also discussed.  

## 2011-09-28 ENCOUNTER — Other Ambulatory Visit: Payer: Self-pay | Admitting: Family Medicine

## 2011-09-28 DIAGNOSIS — Z139 Encounter for screening, unspecified: Secondary | ICD-10-CM

## 2011-10-04 ENCOUNTER — Telehealth: Payer: Self-pay | Admitting: Family Medicine

## 2011-10-04 ENCOUNTER — Other Ambulatory Visit: Payer: Self-pay

## 2011-10-04 DIAGNOSIS — E669 Obesity, unspecified: Secondary | ICD-10-CM

## 2011-10-04 MED ORDER — PHENTERMINE HCL 37.5 MG PO TABS: 37.5000 mg | ORAL_TABLET | Freq: Every day | ORAL | Status: DC

## 2011-10-04 NOTE — Telephone Encounter (Signed)
Med sent.

## 2011-10-10 ENCOUNTER — Ambulatory Visit (HOSPITAL_COMMUNITY)
Admission: RE | Admit: 2011-10-10 | Discharge: 2011-10-10 | Disposition: A | Discharge status: Home / Self Care | Payer: 59 | Source: Ambulatory Visit | Attending: Family Medicine | Admitting: Family Medicine

## 2011-10-10 DIAGNOSIS — Z1231 Encounter for screening mammogram for malignant neoplasm of breast: Secondary | ICD-10-CM | POA: Insufficient documentation

## 2011-10-10 DIAGNOSIS — Z139 Encounter for screening, unspecified: Secondary | ICD-10-CM

## 2011-11-01 ENCOUNTER — Other Ambulatory Visit: Payer: Self-pay | Admitting: Family Medicine

## 2011-11-23 ENCOUNTER — Other Ambulatory Visit: Payer: Self-pay | Admitting: Family Medicine

## 2011-12-06 ENCOUNTER — Encounter: Payer: Self-pay | Admitting: Family Medicine

## 2011-12-06 ENCOUNTER — Ambulatory Visit (INDEPENDENT_AMBULATORY_CARE_PROVIDER_SITE_OTHER): Payer: 59 | Admitting: Family Medicine

## 2011-12-06 VITALS — BP 132/88 | HR 80 | Temp 98.5°F | Ht 62.0 in | Wt 280.0 lb

## 2011-12-06 DIAGNOSIS — R110 Nausea: Secondary | ICD-10-CM | POA: Insufficient documentation

## 2011-12-06 DIAGNOSIS — E8941 Symptomatic postprocedural ovarian failure: Secondary | ICD-10-CM

## 2011-12-06 DIAGNOSIS — J0120 Acute ethmoidal sinusitis, unspecified: Secondary | ICD-10-CM

## 2011-12-06 DIAGNOSIS — J209 Acute bronchitis, unspecified: Secondary | ICD-10-CM | POA: Insufficient documentation

## 2011-12-06 DIAGNOSIS — I10 Essential (primary) hypertension: Secondary | ICD-10-CM

## 2011-12-06 DIAGNOSIS — R112 Nausea with vomiting, unspecified: Secondary | ICD-10-CM

## 2011-12-06 MED ORDER — BENZONATATE 100 MG PO CAPS: 100.0000 mg | ORAL_CAPSULE | Freq: Four times a day (QID) | ORAL | Status: DC | PRN

## 2011-12-06 MED ORDER — VENLAFAXINE HCL ER 37.5 MG PO CP24: 37.5000 mg | ORAL_CAPSULE | Freq: Every day | ORAL | Status: DC

## 2011-12-06 MED ORDER — PROMETHAZINE-DM 6.25-15 MG/5ML PO SYRP: ORAL_SOLUTION | ORAL | Status: AC

## 2011-12-06 MED ORDER — ALBUTEROL SULFATE (2.5 MG/3ML) 0.083% IN NEBU
2.5000 mg | INHALATION_SOLUTION | Freq: Once | RESPIRATORY_TRACT | Status: AC
  Administered 2011-12-06: 2.5 mg via RESPIRATORY_TRACT

## 2011-12-06 MED ORDER — ONDANSETRON HCL 4 MG/2ML IJ SOLN
4.0000 mg | Freq: Once | INTRAMUSCULAR | Status: AC
  Administered 2011-12-06: 4 mg via INTRAMUSCULAR

## 2011-12-06 MED ORDER — CEFTRIAXONE SODIUM 500 MG IJ SOLR
500.0000 mg | Freq: Once | INTRAMUSCULAR | Status: AC
  Administered 2011-12-06: 500 mg via INTRAMUSCULAR

## 2011-12-06 MED ORDER — FLUCONAZOLE 150 MG PO TABS: ORAL_TABLET | ORAL | Status: DC

## 2011-12-06 MED ORDER — IPRATROPIUM BROMIDE 0.02 % IN SOLN
0.5000 mg | Freq: Once | RESPIRATORY_TRACT | Status: AC
  Administered 2011-12-06: 0.5 mg via RESPIRATORY_TRACT

## 2011-12-06 MED ORDER — SULFAMETHOXAZOLE-TRIMETHOPRIM 800-160 MG PO TABS: 1.0000 | ORAL_TABLET | Freq: Two times a day (BID) | ORAL | Status: AC

## 2011-12-06 NOTE — Assessment & Plan Note (Signed)
Recently stopped estrogen will start effexor, info also to be provided re management of hot flashes, this was also discussed  Pt has acute flare of symptoms off hormone

## 2011-12-06 NOTE — Assessment & Plan Note (Signed)
rocephin and neb treatment in office and antibiotic course prescribed

## 2011-12-06 NOTE — Patient Instructions (Addendum)
F/U in 4 month. Call if needed, before.  You are being treated for ethmoid sinusitis, acute bronchitis, and mild gastroenteritis  Neb treatment in office and Rocepin and zofran in office.  Medication is sent in also.  Work excuse to return on 12/07/2011  New is medication for hot flashes and you will get information on this also

## 2011-12-06 NOTE — Assessment & Plan Note (Signed)
Unchanged. Patient re-educated about  the importance of commitment to a  minimum of 150 minutes of exercise per week. The importance of healthy food choices with portion control discussed. Encouraged to start a food diary, count calories and to consider  joining a support group. Sample diet sheets offered. Goals set by the patient for the next several months.    

## 2011-12-06 NOTE — Assessment & Plan Note (Signed)
Antibiotic prescribed 

## 2011-12-06 NOTE — Assessment & Plan Note (Signed)
Controlled, no change in medication DASH diet and commitment to daily physical activity for a minimum of 30 minutes discussed and encouraged, as a part of hypertension management. The importance of attaining a healthy weight is also discussed.  

## 2011-12-06 NOTE — Progress Notes (Signed)
  Subjective:    Patient ID: Stephanie Cannon, female    DOB: March 19, 1970, 41 y.o.   MRN: [id number]  HPI 4 day chest congestion  With sputum which is green and foul tasting. Also has ethmoid pressure and foul tasting post nasal drainage. Denies ear pain or sore throat.Intermittent chills, took antipyretic this morning, no documented fever C/o vomit x 2 last night , and abdominal cramps, has not had loose stool as yet, but stomach feels unsettled.    Review of Systems See HPI  Denies chest pains, palpitations and leg swelling .   Denies dysuria, frequency, hesitancy or incontinence. Denies joint pain, swelling and limitation in mobility. Denies headaches, seizures, numbness, or tingling. Denies depression, anxiety or insomnia. Denies skin break down or rash.        Objective:   Physical Exam  Patient alert and oriented and in no cardiopulmonary distress.  HEENT: No facial asymmetry, EOMI, ethmoid  sinus tenderness,  oropharynx pink and moist.  Neck supple no adenopathy.TM clear  Chest: decreased though adequate air entry, scattered crackles, few  wheezes  CVS: S1, S2 no murmurs, no S3.  ABD: Soft , diffuse superficial tenderness, Bowel sounds hyperactive  Ext: No edema  MS: Adequate ROM spine, shoulders, hips and knees.  Skin: Intact, no ulcerations or rash noted.  Psych: Good eye contact, normal affect. Memory intact not anxious or depressed appearing.  CNS: CN 2-12 intact, power, tone and sensation normal throughout.       Assessment & Plan:

## 2011-12-06 NOTE — Assessment & Plan Note (Signed)
Acute GI upset, zofran in office and phenergan DM as  Hand hygiene discussed and encouraged

## 2011-12-26 ENCOUNTER — Ambulatory Visit: Payer: 59 | Admitting: Family Medicine

## 2012-01-10 ENCOUNTER — Telehealth: Payer: Self-pay | Admitting: Family Medicine

## 2012-01-10 DIAGNOSIS — E669 Obesity, unspecified: Secondary | ICD-10-CM

## 2012-01-10 DIAGNOSIS — E8941 Symptomatic postprocedural ovarian failure: Secondary | ICD-10-CM

## 2012-01-10 MED ORDER — VENLAFAXINE HCL ER 37.5 MG PO CP24: 37.5000 mg | ORAL_CAPSULE | Freq: Every day | ORAL | Status: DC

## 2012-01-10 MED ORDER — PHENTERMINE HCL 37.5 MG PO TABS: 37.5000 mg | ORAL_TABLET | Freq: Every day | ORAL | Status: DC

## 2012-01-10 NOTE — Telephone Encounter (Signed)
Will fax.

## 2012-01-24 ENCOUNTER — Other Ambulatory Visit: Payer: Self-pay | Admitting: Family Medicine

## 2012-02-17 ENCOUNTER — Telehealth: Payer: Self-pay | Admitting: Family Medicine

## 2012-02-20 MED ORDER — CYCLOBENZAPRINE HCL 10 MG PO TABS: ORAL_TABLET | ORAL | Status: DC

## 2012-02-20 MED ORDER — ALPRAZOLAM 1 MG PO TABS: 1.0000 mg | ORAL_TABLET | Freq: Every evening | ORAL | Status: DC | PRN

## 2012-02-20 NOTE — Telephone Encounter (Signed)
Sent in Meloxicam was not on medlist

## 2012-02-23 ENCOUNTER — Encounter (HOSPITAL_COMMUNITY): Payer: Self-pay | Admitting: *Deleted

## 2012-02-23 ENCOUNTER — Emergency Department (HOSPITAL_COMMUNITY)
Admission: EM | Admit: 2012-02-23 | Discharge: 2012-02-23 | Disposition: A | Discharge status: Home / Self Care | Payer: Self-pay | Attending: Emergency Medicine | Admitting: Emergency Medicine

## 2012-02-23 DIAGNOSIS — J029 Acute pharyngitis, unspecified: Secondary | ICD-10-CM | POA: Insufficient documentation

## 2012-02-23 DIAGNOSIS — M542 Cervicalgia: Secondary | ICD-10-CM | POA: Insufficient documentation

## 2012-02-23 DIAGNOSIS — Z862 Personal history of diseases of the blood and blood-forming organs and certain disorders involving the immune mechanism: Secondary | ICD-10-CM | POA: Insufficient documentation

## 2012-02-23 DIAGNOSIS — J3489 Other specified disorders of nose and nasal sinuses: Secondary | ICD-10-CM | POA: Insufficient documentation

## 2012-02-23 DIAGNOSIS — Z87891 Personal history of nicotine dependence: Secondary | ICD-10-CM | POA: Insufficient documentation

## 2012-02-23 DIAGNOSIS — G8929 Other chronic pain: Secondary | ICD-10-CM | POA: Insufficient documentation

## 2012-02-23 DIAGNOSIS — H9203 Otalgia, bilateral: Secondary | ICD-10-CM

## 2012-02-23 DIAGNOSIS — H9209 Otalgia, unspecified ear: Secondary | ICD-10-CM | POA: Insufficient documentation

## 2012-02-23 DIAGNOSIS — I10 Essential (primary) hypertension: Secondary | ICD-10-CM | POA: Insufficient documentation

## 2012-02-23 DIAGNOSIS — M549 Dorsalgia, unspecified: Secondary | ICD-10-CM | POA: Insufficient documentation

## 2012-02-23 DIAGNOSIS — Z8639 Personal history of other endocrine, nutritional and metabolic disease: Secondary | ICD-10-CM | POA: Insufficient documentation

## 2012-02-23 DIAGNOSIS — Z79899 Other long term (current) drug therapy: Secondary | ICD-10-CM | POA: Insufficient documentation

## 2012-02-23 DIAGNOSIS — E669 Obesity, unspecified: Secondary | ICD-10-CM | POA: Insufficient documentation

## 2012-02-23 DIAGNOSIS — Z8739 Personal history of other diseases of the musculoskeletal system and connective tissue: Secondary | ICD-10-CM | POA: Insufficient documentation

## 2012-02-23 DIAGNOSIS — Z9089 Acquired absence of other organs: Secondary | ICD-10-CM | POA: Insufficient documentation

## 2012-02-23 DIAGNOSIS — Z8719 Personal history of other diseases of the digestive system: Secondary | ICD-10-CM | POA: Insufficient documentation

## 2012-02-23 DIAGNOSIS — R1310 Dysphagia, unspecified: Secondary | ICD-10-CM | POA: Insufficient documentation

## 2012-02-23 DIAGNOSIS — M436 Torticollis: Secondary | ICD-10-CM | POA: Insufficient documentation

## 2012-02-23 MED ORDER — IBUPROFEN 800 MG PO TABS
800.0000 mg | ORAL_TABLET | Freq: Once | ORAL | Status: AC
  Administered 2012-02-23: 800 mg via ORAL
  Filled 2012-02-23: qty 1

## 2012-02-23 MED ORDER — IBUPROFEN 800 MG PO TABS: 800.0000 mg | ORAL_TABLET | Freq: Three times a day (TID) | ORAL | Status: DC

## 2012-02-23 NOTE — ED Provider Notes (Signed)
History     CSN: [id number]  Arrival date & time 02/23/12  0451   First MD Initiated Contact with Patient 02/23/12 [phone]      Chief Complaint  Patient presents with  . Otalgia  . Sore Throat  . Torticollis  . Nasal Congestion    (Consider location/radiation/quality/duration/timing/severity/associated sxs/prior treatment) HPI Stephanie Cannon is a 42 y.o. female who presents to the Emergency Department complaining of sore throat, ear pain, and neck pain. Denies fever, chills.   Past Medical History  Diagnosis Date  . Obesity   . Carpal tunnel syndrome   . Hypertension   . GERD (gastroesophageal reflux disease)   . Chronic back pain   . Arthritis   . Hyperlipidemia     Past Surgical History  Procedure Laterality Date  . Abdominal hysterectomy    . Carpal tunnel release    . Cholecystectomy    . Tonsillectomy      Family History  Problem Relation Age of Onset  . Hypertension Mother   . Hypertension Father   . Hypertension Brother   . Cancer Maternal Aunt   . Cancer Maternal Uncle     liver  . Cancer Paternal Uncle     bone cancer  . Diabetes Maternal Grandmother     History  Substance Use Topics  . Smoking status: Former Games developer  . Smokeless tobacco: Not on file  . Alcohol Use: Yes     Comment: occasionally    OB History   Grav Para Term Preterm Abortions TAB SAB Ect Mult Living                  Review of Systems  Constitutional: Negative for fever.       10 Systems reviewed and are negative for acute change except as noted in the HPI.  HENT: Positive for ear pain and sore throat. Negative for congestion.   Eyes: Negative for discharge and redness.  Respiratory: Negative for cough and shortness of breath.   Cardiovascular: Negative for chest pain.  Gastrointestinal: Negative for vomiting and abdominal pain.  Musculoskeletal: Negative for back pain.  Skin: Negative for rash.  Neurological: Negative for syncope, numbness and headaches.   Psychiatric/Behavioral:       No behavior change.    Allergies  Codeine and Morphine  Home Medications   Current Outpatient Rx  Name  Route  Sig  Dispense  Refill  . ALPRAZolam (XANAX) 1 MG tablet   Oral   Take 1 tablet (1 mg total) by mouth at bedtime as needed. TAKE 1 TABLET BY MOUTH AT BEDTIME AS NEEDED   90 tablet   0   . cyclobenzaprine (FLEXERIL) 10 MG tablet      TAKE 1 TABLET (10 MG TOTAL) BY MOUTH 2 (TWO) TIMES DAILY AS NEEDED FOR MUSCLE SPASMS.   180 tablet   0   . naproxen (NAPROSYN) 500 MG tablet      TAKE 1 TABLET BY MOUTH TWICE DAILY AS NEEDED FOR PAIN   180 tablet   2   . NEXIUM 40 MG capsule      TAKE 1 CAPSULE BY MOUTH DAILY.   90 capsule   2   . venlafaxine XR (EFFEXOR-XR) 37.5 MG 24 hr capsule   Oral   Take 1 capsule (37.5 mg total) by mouth daily.   90 capsule   0   . benzonatate (TESSALON PERLES) 100 MG capsule   Oral   Take 1 capsule (100 mg total)  by mouth every 6 (six) hours as needed for cough.   30 capsule   0   . cyclobenzaprine (FLEXERIL) 10 MG tablet   Oral   Take 10 mg by mouth at bedtime as needed.         . fluconazole (DIFLUCAN) 150 MG tablet      One tablet once daily, as needed, for vaginal itch   2 tablet   0   . EXPIRED: phentermine (ADIPEX-P) 37.5 MG tablet   Oral   Take 1 tablet (37.5 mg total) by mouth daily before breakfast.   90 tablet   0   . triamterene-hydrochlorothiazide (MAXZIDE-25) 37.5-25 MG per tablet      TAKE 1 & 1/2 TABLET BY MOUTH DAILY   45 tablet   PRN   . VIMOVO 500-20 MG TBEC      TAKE 1 TABLET BY MOUTH TWICE DAILY FOR 5 DAYS, THEN AS NEEDED.   60 tablet   1     BP 144/82  Pulse 98  Temp(Src) 98.3 F (36.8 C) (Oral)  Ht 5\' 1"  (1.549 m)  Wt 270 lb (122.471 kg)  BMI 51.04 kg/m2  SpO2 98%  Physical Exam  Nursing note and vitals reviewed. Constitutional:  Awake, alert, nontoxic appearance.  HENT:  Head: Normocephalic and atraumatic.  Right Ear: External ear normal.   Left Ear: External ear normal.  Nose: Nose normal.  Mouth/Throat: Oropharynx is clear and moist.  Tenderness to right Eustachian tube distribution  Eyes: Right eye exhibits no discharge. Left eye exhibits no discharge.  Neck: Neck supple.  Cardiovascular: Normal rate and normal heart sounds.   Pulmonary/Chest: Effort normal and breath sounds normal. She exhibits no tenderness.  Abdominal: Soft. Bowel sounds are normal. There is no tenderness. There is no rebound.  Musculoskeletal: She exhibits no tenderness.  Baseline ROM, no obvious new focal weakness.  Neurological:  Mental status and motor strength appears baseline for patient and situation.  Skin: No rash noted.  Psychiatric: She has a normal mood and affect.    ED Course  Procedures (including critical care time)  Results for orders placed during the hospital encounter of 02/23/12  RAPID STREP SCREEN      Result Value Range   Streptococcus, Group A Screen (Direct) NEGATIVE  NEGATIVE      MDM  Patient with sore ears, sore throat and neck pain. Strep negative. Given ibuprofen. Pt stable in ED with no significant deterioration in condition.The patient appears reasonably screened and/or stabilized for discharge and I doubt any other medical condition or other [facility] requiring further screening, evaluation, or treatment in the ED at this time prior to discharge.  MDM Reviewed: nursing note and vitals Interpretation: labs           Nicoletta Dress. Colon Branch, MD 02/23/12 [phone]

## 2012-02-23 NOTE — ED Notes (Signed)
Discharge instructions reviewed with pt, questions answered. Pt verbalized understanding.  

## 2012-02-23 NOTE — ED Notes (Signed)
Pt co congestion for past 2 days, rt side of neck, jaw, ear, and throat pain. Pt states hurts to swallow.

## 2012-02-28 ENCOUNTER — Telehealth: Payer: Self-pay | Admitting: Family Medicine

## 2012-02-29 ENCOUNTER — Other Ambulatory Visit: Payer: Self-pay | Admitting: Family Medicine

## 2012-02-29 NOTE — Telephone Encounter (Signed)
Ranitidine is the onlly equivalent that is low cost, if that doesn't work then she needs to use OTC priliosec Zantac entered pls fax after you spk with her

## 2012-02-29 NOTE — Telephone Encounter (Signed)
Xanax refilled on 2/17 and awaiting pickup at pharmacy.  Please advise on nexium.

## 2012-03-02 ENCOUNTER — Other Ambulatory Visit: Payer: Self-pay

## 2012-03-02 ENCOUNTER — Other Ambulatory Visit: Payer: Self-pay | Admitting: Family Medicine

## 2012-03-02 MED ORDER — RANITIDINE HCL 150 MG PO TABS: 150.0000 mg | ORAL_TABLET | Freq: Two times a day (BID) | ORAL | Status: DC

## 2012-04-09 ENCOUNTER — Telehealth: Payer: Self-pay | Admitting: Family Medicine

## 2012-04-09 DIAGNOSIS — E8941 Symptomatic postprocedural ovarian failure: Secondary | ICD-10-CM

## 2012-04-09 MED ORDER — VENLAFAXINE HCL ER 37.5 MG PO CP24: 37.5000 mg | ORAL_CAPSULE | Freq: Every day | ORAL | Status: DC

## 2012-04-09 NOTE — Telephone Encounter (Signed)
Refilled

## 2012-04-11 ENCOUNTER — Other Ambulatory Visit: Payer: Self-pay

## 2012-04-11 ENCOUNTER — Telehealth: Payer: Self-pay | Admitting: Family Medicine

## 2012-04-11 DIAGNOSIS — E8941 Symptomatic postprocedural ovarian failure: Secondary | ICD-10-CM

## 2012-04-11 MED ORDER — VENLAFAXINE HCL ER 37.5 MG PO CP24: 37.5000 mg | ORAL_CAPSULE | Freq: Every day | ORAL | Status: DC

## 2012-04-11 NOTE — Telephone Encounter (Signed)
Med refilled.

## 2012-04-16 ENCOUNTER — Ambulatory Visit: Payer: Self-pay | Admitting: Family Medicine

## 2012-04-16 ENCOUNTER — Encounter: Payer: Self-pay | Admitting: Family Medicine

## 2012-04-25 ENCOUNTER — Telehealth: Payer: Self-pay | Admitting: Family Medicine

## 2012-04-26 NOTE — Telephone Encounter (Signed)
No record documented that patient called to cancel appointment.

## 2012-05-03 ENCOUNTER — Ambulatory Visit: Payer: Self-pay | Admitting: Family Medicine

## 2012-05-23 ENCOUNTER — Other Ambulatory Visit: Payer: Self-pay | Admitting: Family Medicine

## 2012-05-25 ENCOUNTER — Other Ambulatory Visit: Payer: Self-pay | Admitting: Family Medicine

## 2012-06-07 ENCOUNTER — Other Ambulatory Visit: Payer: Self-pay | Admitting: Family Medicine

## 2012-07-11 ENCOUNTER — Ambulatory Visit (INDEPENDENT_AMBULATORY_CARE_PROVIDER_SITE_OTHER): Payer: 59 | Admitting: Family Medicine

## 2012-07-11 ENCOUNTER — Encounter: Payer: Self-pay | Admitting: Family Medicine

## 2012-07-11 VITALS — BP 126/84 | HR 96 | Resp 18 | Wt 288.1 lb

## 2012-07-11 DIAGNOSIS — M549 Dorsalgia, unspecified: Secondary | ICD-10-CM

## 2012-07-11 DIAGNOSIS — R197 Diarrhea, unspecified: Secondary | ICD-10-CM

## 2012-07-11 DIAGNOSIS — E8881 Metabolic syndrome: Secondary | ICD-10-CM

## 2012-07-11 DIAGNOSIS — F329 Major depressive disorder, single episode, unspecified: Secondary | ICD-10-CM

## 2012-07-11 DIAGNOSIS — F3289 Other specified depressive episodes: Secondary | ICD-10-CM

## 2012-07-11 DIAGNOSIS — E785 Hyperlipidemia, unspecified: Secondary | ICD-10-CM

## 2012-07-11 DIAGNOSIS — I10 Essential (primary) hypertension: Secondary | ICD-10-CM

## 2012-07-11 DIAGNOSIS — F419 Anxiety disorder, unspecified: Secondary | ICD-10-CM

## 2012-07-11 DIAGNOSIS — R490 Dysphonia: Secondary | ICD-10-CM

## 2012-07-11 DIAGNOSIS — F411 Generalized anxiety disorder: Secondary | ICD-10-CM

## 2012-07-11 MED ORDER — DIPHENOXYLATE-ATROPINE 2.5-0.025 MG PO TABS: 1.0000 | ORAL_TABLET | Freq: Four times a day (QID) | ORAL | Status: DC | PRN

## 2012-07-11 MED ORDER — PHENTERMINE-TOPIRAMATE ER 7.5-46 MG PO CP24: 1.0000 | ORAL_CAPSULE | Freq: Every day | ORAL | Status: DC

## 2012-07-11 NOTE — Progress Notes (Signed)
  Subjective:    Patient ID: Stephanie Cannon, female    DOB: 1970/10/30, 42 y.o.   MRN: [id number]  HPI The PT is here for follow up and re-evaluation of chronic medical conditions, medication management and review of any available recent lab and radiology data.  Preventive health is updated, specifically  Cancer screening and Immunization.   C/o chronic painless hoarseness and inability to sing as she could in the past. C/o disabling diareah, every time she eats needs to have a BM, to the extent she limits her restaurant experiences , and has a sore buttock from frequent need to clean up C/o increasingly disabling back pain, avoids shopping due to pain limiting movement, requests handicap sticker due to the debility. Current work place she has to park too far away from the building which is a challenge for her. Often has back spasm and relies on medicatin regularly for this Willing to stick with an extended program of weight accountability with frequent follow up      Review of Systems See HPI Denies recent fever or chills. Denies sinus pressure, nasal congestion, ear pain or sore throat. Denies chest congestion, productive cough or wheezing. Denies chest pains, palpitations and leg swelling Denies abdominal pain, nausea, vomiting,diarrhea or constipation.   Denies dysuria, frequency, hesitancy or incontinence.  Denies headaches, seizures, numbness, or tingling. Denies uncontrolled  depression,  Does have anxiety and  Insomnia.Uses medication for both         Objective:   Physical Exam Patient alert and oriented and in no cardiopulmonary distress.  HEENT: No facial asymmetry, EOMI, no sinus tenderness,  oropharynx pink and moist.  Neck supple no adenopathy.  Chest: Clear to auscultation bilaterally.  CVS: S1, S2 no murmurs, no S3.  ABD: Soft non tender. Bowel sounds normal.  Ext: No edema  MS: Decreased  ROM spine, hips and knees.  Skin: Intact, no ulcerations or rash  noted.  Psych: Good eye contact, normal affect. Memory intact not anxious mildly depressed appearing.  CNS: CN 2-12 intact, power, tone and sensation normal throughout.        Assessment & Plan:

## 2012-07-11 NOTE — Patient Instructions (Addendum)
F/u in 4 month, call if you need me before  New medication for appetite suppression, take once daily  You are referred to dr Darrick Penna re chronic diarheah, in the interim use lomotil as needed to help with diarheah, pls discuss foul smelling stool also  You are referred to dr Suszanne Conners for chromnic hoarseness.  SLOW and steady wins all races in weight loss. Commit to 4 pounds per month  Fasting lipid, cmp, HBA1C , TSH  Handicap sticker for back pain for 1 year with the plan that as the first 50 pounds has gone the limitation in mobility will go

## 2012-07-12 ENCOUNTER — Telehealth: Payer: Self-pay | Admitting: Family Medicine

## 2012-07-12 MED ORDER — PHENTERMINE HCL 37.5 MG PO TABS: 37.5000 mg | ORAL_TABLET | Freq: Every day | ORAL | Status: DC

## 2012-07-12 NOTE — Telephone Encounter (Signed)
Pt aware that phentermine has been prescribed and fax in for her

## 2012-07-13 NOTE — Telephone Encounter (Signed)
Med faxed   

## 2012-07-26 ENCOUNTER — Ambulatory Visit (INDEPENDENT_AMBULATORY_CARE_PROVIDER_SITE_OTHER): Payer: 59 | Admitting: Otolaryngology

## 2012-07-30 DIAGNOSIS — E8881 Metabolic syndrome: Secondary | ICD-10-CM | POA: Insufficient documentation

## 2012-07-30 NOTE — Assessment & Plan Note (Signed)
Increased CVD risk, lifestyle change and weight loss neeeded to reduce this

## 2012-07-30 NOTE — Assessment & Plan Note (Signed)
Increased an uncontrolled, continue meds Handicap sticker requested Weight loss will improve this

## 2012-07-30 NOTE — Assessment & Plan Note (Signed)
Controlled, no change in medication DASH diet and commitment to daily physical activity for a minimum of 30 minutes discussed and encouraged, as a part of hypertension management. The importance of attaining a healthy weight is also discussed.  

## 2012-07-30 NOTE — Assessment & Plan Note (Signed)
Unchanged, meds as before 

## 2012-07-30 NOTE — Assessment & Plan Note (Signed)
Improved, continue current med 

## 2012-07-30 NOTE — Assessment & Plan Note (Signed)
Deteriorated. Patient re-educated about  the importance of commitment to a  minimum of 150 minutes of exercise per week. The importance of healthy food choices with portion control discussed. Encouraged to start a food diary, count calories and to consider  joining a support group. Sample diet sheets offered. Goals set by the patient for the next several months.Pt to resume phentermine, target 4 pounds per month

## 2012-07-30 NOTE — Assessment & Plan Note (Signed)
Progressive painless hoarseness, likely GERD related needs ENT eval referral entered

## 2012-07-30 NOTE — Assessment & Plan Note (Signed)
Updated lab needed Hyperlipidemia:Low fat diet discussed and encouraged.   

## 2012-07-30 NOTE — Assessment & Plan Note (Signed)
Disabling , trial of lomotil and GI eval

## 2012-07-31 ENCOUNTER — Other Ambulatory Visit: Payer: Self-pay | Admitting: Family Medicine

## 2012-07-31 ENCOUNTER — Telehealth: Payer: Self-pay | Admitting: Family Medicine

## 2012-07-31 DIAGNOSIS — M7989 Other specified soft tissue disorders: Secondary | ICD-10-CM

## 2012-07-31 NOTE — Telephone Encounter (Signed)
[facility] on [address] in [place]

## 2012-07-31 NOTE — Telephone Encounter (Signed)
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

## 2012-08-01 NOTE — Telephone Encounter (Signed)
Faxed  Over order

## 2012-08-02 ENCOUNTER — Encounter (INDEPENDENT_AMBULATORY_CARE_PROVIDER_SITE_OTHER): Payer: 59

## 2012-08-02 DIAGNOSIS — M7989 Other specified soft tissue disorders: Secondary | ICD-10-CM

## 2012-08-02 DIAGNOSIS — M79609 Pain in unspecified limb: Secondary | ICD-10-CM

## 2012-08-13 ENCOUNTER — Other Ambulatory Visit: Payer: Self-pay | Admitting: Family Medicine

## 2012-08-15 ENCOUNTER — Telehealth: Payer: Self-pay | Admitting: Family Medicine

## 2012-08-16 ENCOUNTER — Other Ambulatory Visit: Payer: Self-pay | Admitting: Family Medicine

## 2012-08-16 ENCOUNTER — Ambulatory Visit (INDEPENDENT_AMBULATORY_CARE_PROVIDER_SITE_OTHER): Payer: 59 | Admitting: Otolaryngology

## 2012-08-16 DIAGNOSIS — R490 Dysphonia: Secondary | ICD-10-CM

## 2012-08-16 DIAGNOSIS — K219 Gastro-esophageal reflux disease without esophagitis: Secondary | ICD-10-CM

## 2012-08-16 MED ORDER — TOPIRAMATE 25 MG PO TABS: 25.0000 mg | ORAL_TABLET | Freq: Every day | ORAL | Status: DC

## 2012-08-16 NOTE — Telephone Encounter (Signed)
Called and left voicemail for patient.

## 2012-08-16 NOTE — Telephone Encounter (Signed)
pls let her know that the topamax low dose is  sent to her pharmacy. I recommend she takes the topamax at bedtime, and continue the phentermine in the morning

## 2012-08-23 ENCOUNTER — Encounter: Payer: Self-pay | Admitting: Gastroenterology

## 2012-08-23 ENCOUNTER — Ambulatory Visit (INDEPENDENT_AMBULATORY_CARE_PROVIDER_SITE_OTHER): Payer: 59 | Admitting: Gastroenterology

## 2012-08-23 VITALS — BP 128/80 | HR 80 | Temp 98.2°F | Ht 60.0 in | Wt 285.2 lb

## 2012-08-23 DIAGNOSIS — R195 Other fecal abnormalities: Secondary | ICD-10-CM | POA: Insufficient documentation

## 2012-08-23 MED ORDER — DICYCLOMINE HCL 10 MG PO CAPS: 10.0000 mg | ORAL_CAPSULE | Freq: Three times a day (TID) | ORAL | Status: DC

## 2012-08-23 MED ORDER — PEG 3350-KCL-NA BICARB-NACL 420 G PO SOLR: 4000.0000 mL | ORAL | Status: DC

## 2012-08-23 NOTE — Assessment & Plan Note (Signed)
42 year old female with chronic loose stools, occasional fecal incontinence, fecal urgency, with associated abdominal cramping. No rectal bleeding noted or other concerning signs. She does correlate her symptom onset to after she had a cholecystectomy. Question of bile salt diarrhea. With her significant symptoms, will go ahead and proceed with a colonoscopy in the near future and obtain baseline stool studies now. Trial of Bentyl now.  Cdiff PCR, Stool culture, Giardia Proceed with colonoscopy with Dr. Darrick Penna in the near future. The risks, benefits, and alternatives have been discussed in detail with the patient. They state understanding and desire to proceed.

## 2012-08-23 NOTE — Patient Instructions (Addendum)
We have scheduled you for a colonoscopy with Dr. Darrick Penna in the near future.  Please complete the stool studies and return to the lab.   Start taking Bentyl 1 capsule with meals and at bedtime. This is for loose stool and cramping.

## 2012-08-23 NOTE — Progress Notes (Signed)
Referring Provider: Kerri Perches, MD Primary Care Physician:  Syliva Overman, MD Primary Gastroenterologist:  Dr. Darrick Penna   Chief Complaint  Patient presents with  . Diarrhea    after eating    HPI:   Stephanie Cannon is a 42 year old female presenting today at the request of Dr. Lodema Hong secondary to diarrhea. Recently saw ENT due to hoarseness and dry mouth. Had been off Nexium for about a week. Sings soprano in the choir. Feels better now. No dysphagia.   Notes diarrhea, greater than a year. Notes onset of symptoms after cholecystectomy. Fecal urgency, incontinence occasionally. Postprandial diarrhea. Improved since Lomitil some but not completely. No weight loss. Gets nauseated, starts sweating, then has to have loose stool. Abdominal cramping precedes diarrhea.   No family history of colon cancer. No prior colonoscopy.   Past Medical History  Diagnosis Date  . Obesity   . Carpal tunnel syndrome   . Hypertension   . GERD (gastroesophageal reflux disease)   . Chronic back pain   . Arthritis   . Hyperlipidemia     Past Surgical History  Procedure Laterality Date  . Abdominal hysterectomy    . Carpal tunnel release    . Cholecystectomy    . Tonsillectomy    . Cesarean section    . Ovarian tumor  2005    Current Outpatient Prescriptions  Medication Sig Dispense Refill  . ALPRAZolam (XANAX) 1 MG tablet TAKE 1 TABLET BY MOUTH AT BEDTIME AS NEEDED  90 tablet  0  . cyclobenzaprine (FLEXERIL) 10 MG tablet TAKE 1 TABLET BY MOUTH TWICE DAILY AS NEEDED FOR MUSCLE SPASMS  180 tablet  0  . diphenoxylate-atropine (LOMOTIL) 2.5-0.025 MG per tablet Take 1 tablet by mouth 4 (four) times daily as needed for diarrhea or loose stools.  60 tablet  1  . meloxicam (MOBIC) 15 MG tablet TAKE 1 TABLET (15 MG TOTAL) BY MOUTH DAILY.  90 tablet  1  . naproxen (NAPROSYN) 500 MG tablet TAKE 1 TABLET BY MOUTH TWICE DAILY AS NEEDED FOR PAIN  180 tablet  2  . NEXIUM 40 MG capsule TAKE 1 CAPSULE BY  MOUTH ONCE DAILY  90 capsule  1  . triamterene-hydrochlorothiazide (MAXZIDE-25) 37.5-25 MG per tablet TAKE 1 & 1/2 TABLET BY MOUTH DAILY  45 tablet  PRN  . dicyclomine (BENTYL) 10 MG capsule Take 1 capsule (10 mg total) by mouth 4 (four) times daily -  before meals and at bedtime.  120 capsule  3   No current facility-administered medications for this visit.    Allergies as of 08/23/2012 - Review Complete 08/23/2012  Allergen Reaction Noted  . Codeine  01/23/2008  . Morphine  01/23/2008    Family History  Problem Relation Age of Onset  . Hypertension Mother   . Hypertension Father   . Hypertension Brother   . Cancer Maternal Aunt   . Cancer Maternal Uncle     liver  . Cancer Paternal Uncle     bone cancer  . Diabetes Maternal Grandmother   . Colon cancer Neg Hx     History   Social History  . Marital Status: Married    Spouse Name: N/A    Number of Children: N/A  . 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 Smoking status: Former Games developer  . Smokeless tobacco: Not on file     Comment: quit  smoking 2011  . Alcohol Use: Yes     Comment: occasionally  . Drug Use: No  . Sexual Activity: Not on file   Other Topics Concern  . Not on file   Social History Narrative  . No narrative on file    Review of Systems: Negative unless mentioned in HPI.   Physical Exam: BP 128/80  Pulse 80  Temp(Src) 98.2 F (36.8 C) (Oral)  Ht 5' (1.524 m)  Wt 285 lb 3.2 oz (129.366 kg)  BMI 55.7 kg/m2 General:   Alert and oriented. Well-developed, well-nourished, pleasant and cooperative. Head:  Normocephalic and atraumatic. Eyes:  Conjunctiva pink, sclera clear, no icterus.    Ears:  Normal auditory acuity. Nose:  No deformity, discharge,  or lesions. Mouth:  No deformity or lesions, mucosa pink and moist.  Neck:  Supple, without mass or thyromegaly. Lungs:  Clear to auscultation bilaterally,  without wheezing, rales, or rhonchi.  Heart:  S1, S2 present without murmurs noted.  Abdomen:  +BS, soft, non-tender and non-distended. Without mass or HSM. No rebound or guarding. No hernias noted. Rectal:  Deferred  Msk:  Symmetrical without gross deformities. Normal posture. Extremities:  Without clubbing or edema. Neurologic:  Alert and  oriented x4;  grossly normal neurologically. Skin:  Intact, warm and dry without significant lesions or rashes Cervical Nodes:  No significant cervical adenopathy. Psych:  Alert and cooperative. Normal mood and affect.

## 2012-08-24 NOTE — Progress Notes (Signed)
cc'd to pcp 

## 2012-08-27 ENCOUNTER — Other Ambulatory Visit: Payer: Self-pay | Admitting: Gastroenterology

## 2012-08-27 DIAGNOSIS — R195 Other fecal abnormalities: Secondary | ICD-10-CM

## 2012-09-06 ENCOUNTER — Telehealth: Payer: Self-pay | Admitting: Family Medicine

## 2012-09-11 ENCOUNTER — Other Ambulatory Visit: Payer: Self-pay | Admitting: Gastroenterology

## 2012-09-11 ENCOUNTER — Other Ambulatory Visit: Payer: Self-pay | Admitting: Family Medicine

## 2012-09-11 DIAGNOSIS — M549 Dorsalgia, unspecified: Secondary | ICD-10-CM

## 2012-09-11 LAB — COMPREHENSIVE METABOLIC PANEL
ALT: 22 U/L (ref 0–35)
Albumin: 4.3 g/dL (ref 3.5–5.2)
Alkaline Phosphatase: 82 U/L (ref 39–117)
CO2: 33 mEq/L — ABNORMAL HIGH (ref 19–32)
Potassium: 4.6 mEq/L (ref 3.5–5.3)
Sodium: 141 mEq/L (ref 135–145)
Total Bilirubin: 0.4 mg/dL (ref 0.3–1.2)
Total Protein: 7.6 g/dL (ref 6.0–8.3)

## 2012-09-11 LAB — LIPID PANEL
HDL: 56 mg/dL (ref >39)
LDL Cholesterol: 129 mg/dL — ABNORMAL HIGH (ref 0–99)
Triglycerides: 99 mg/dL (ref <150)

## 2012-09-11 NOTE — Telephone Encounter (Signed)
pls refer and let pt know it has been entered, find out specific office she requests pls. Also send MRI of low back to chiropracter, aND LET HER KNOW THIS IS BEING DONE ALSO

## 2012-09-12 ENCOUNTER — Encounter (HOSPITAL_COMMUNITY): Payer: Self-pay | Admitting: Pharmacy Technician

## 2012-09-12 LAB — TSH: TSH: 1.724 u[IU]/mL (ref 0.350–4.500)

## 2012-09-12 NOTE — Progress Notes (Signed)
Quick Note:  Cdiff negative, stool culture still pending. Proceed with TCS as planned. As of note, these were ordered several weeks ago. ______

## 2012-09-13 NOTE — Progress Notes (Signed)
Quick Note:  LMOM to call. ______ 

## 2012-09-14 ENCOUNTER — Telehealth: Payer: Self-pay | Admitting: Gastroenterology

## 2012-09-14 NOTE — Telephone Encounter (Signed)
Pt called this morning to say someone had tried calling her yesterday regarding her stool sample. I told her that it looks like DS had left her a message to call but i do not know the message was. I told her DS was not in today and I would get the message back to DS that patient had returned her call. She asked for [place] to call her work number at [phone]

## 2012-09-15 LAB — STOOL CULTURE

## 2012-09-17 NOTE — Telephone Encounter (Signed)
Pt is aware of her results.  

## 2012-09-17 NOTE — Progress Notes (Signed)
Quick Note:  Pt returned call and was informed. ______ 

## 2012-09-21 ENCOUNTER — Ambulatory Visit: Admit: 2012-09-21 | Payer: Self-pay | Admitting: Internal Medicine

## 2012-09-21 ENCOUNTER — Encounter (HOSPITAL_COMMUNITY)
Admission: RE | Disposition: A | Discharge status: Home / Self Care | Payer: Self-pay | Source: Ambulatory Visit | Attending: Gastroenterology

## 2012-09-21 ENCOUNTER — Ambulatory Visit (HOSPITAL_COMMUNITY)
Admission: RE | Admit: 2012-09-21 | Discharge: 2012-09-21 | Disposition: A | Discharge status: Home / Self Care | Payer: 59 | Source: Ambulatory Visit | Attending: Gastroenterology | Admitting: Gastroenterology

## 2012-09-21 ENCOUNTER — Encounter (HOSPITAL_COMMUNITY): Payer: Self-pay | Admitting: *Deleted

## 2012-09-21 DIAGNOSIS — I10 Essential (primary) hypertension: Secondary | ICD-10-CM | POA: Insufficient documentation

## 2012-09-21 DIAGNOSIS — K648 Other hemorrhoids: Secondary | ICD-10-CM

## 2012-09-21 DIAGNOSIS — R197 Diarrhea, unspecified: Secondary | ICD-10-CM | POA: Insufficient documentation

## 2012-09-21 DIAGNOSIS — D128 Benign neoplasm of rectum: Secondary | ICD-10-CM | POA: Insufficient documentation

## 2012-09-21 DIAGNOSIS — K620 Anal polyp: Secondary | ICD-10-CM

## 2012-09-21 DIAGNOSIS — R195 Other fecal abnormalities: Secondary | ICD-10-CM

## 2012-09-21 DIAGNOSIS — K621 Rectal polyp: Secondary | ICD-10-CM

## 2012-09-21 HISTORY — PX: COLONOSCOPY: SHX5424

## 2012-09-21 SURGERY — COLONOSCOPY
Anesthesia: Moderate Sedation

## 2012-09-21 MED ORDER — MIDAZOLAM HCL 5 MG/5ML IJ SOLN
INTRAMUSCULAR | Status: AC
  Filled 2012-09-21: qty 10

## 2012-09-21 MED ORDER — MIDAZOLAM HCL 5 MG/5ML IJ SOLN
INTRAMUSCULAR | Status: DC | PRN
  Administered 2012-09-21 (×2): 2 mg via INTRAVENOUS
  Administered 2012-09-21 (×2): 1 mg via INTRAVENOUS

## 2012-09-21 MED ORDER — MEPERIDINE HCL 100 MG/ML IJ SOLN
INTRAMUSCULAR | Status: DC | PRN
  Administered 2012-09-21 (×4): 25 mg via INTRAVENOUS

## 2012-09-21 MED ORDER — MEPERIDINE HCL 100 MG/ML IJ SOLN
INTRAMUSCULAR | Status: AC
  Filled 2012-09-21: qty 2

## 2012-09-21 MED ORDER — SODIUM CHLORIDE 0.9 % IV SOLN
INTRAVENOUS | Status: DC
  Administered 2012-09-21: 09:00:00 via INTRAVENOUS

## 2012-09-21 MED ORDER — STERILE WATER FOR IRRIGATION IR SOLN
Status: DC | PRN
  Administered 2012-09-21: 09:00:00

## 2012-09-21 MED ORDER — LIDOCAINE HCL 2 % EX GEL
CUTANEOUS | Status: DC | PRN
  Administered 2012-09-21: 1 via TOPICAL

## 2012-09-21 MED ORDER — SODIUM CHLORIDE 0.9 % IJ SOLN
INTRAMUSCULAR | Status: AC
  Filled 2012-09-21: qty 10

## 2012-09-21 MED ORDER — PROMETHAZINE HCL 25 MG/ML IJ SOLN
INTRAMUSCULAR | Status: AC
  Filled 2012-09-21: qty 1

## 2012-09-21 NOTE — Op Note (Signed)
[facility] [address] [place], [address]   COLONOSCOPY PROCEDURE REPORT  PATIENT: Stephanie Cannon, Stephanie Cannon  MR#: [id number] BIRTHDATE: 11-15-1970 , 42  yrs. old GENDER: Female ENDOSCOPIST: Jonette Eva, MD REFERRED AO:[id number] Lodema Hong, M.D. PROCEDURE DATE:  09/21/2012 PROCEDURE:   Colonoscopy with cold biopsy polypectomy and Colonoscopy with biopsy INDICATIONS:unexplained diarrhea. MEDICATIONS: Demerol 125 mg IV and Versed 6 mg IV  DESCRIPTION OF PROCEDURE:    Physical exam was performed.  Informed consent was obtained from the patient after explaining the benefits, risks, and alternatives to procedure.  The patient was connected to monitor and placed in left lateral position. Continuous oxygen was provided by nasal cannula and IV medicine administered through an indwelling cannula.  After administration of sedation and rectal exam, the patients rectum was intubated and the EC-[id number] ([id number])  colonoscope was advanced under direct visualization to the ileum.  The scope was removed slowly by carefully examining the color, texture, anatomy, and integrity mucosa on the way out.  The patient was recovered in endoscopy and discharged home in satisfactory condition.    COLON FINDINGS: The mucosa appeared normal in the terminal ileum.  , Two sessile polyps measuring 2-3 mm in size were found in the rectum.  A polypectomy was performed with cold forceps.  , OTHERWISE, A normal appearing cecum, ileocecal valve, and appendiceal orifice were identified.  The ascending, hepatic flexure, transverse, splenic flexure, descending, sigmoid colon and rectum appeared unremarkable.  No cancer seen.  Multiple biopsies were performed.  , and Small internal hemorrhoids were found.  PREP QUALITY: good. CECAL W/D TIME: 15 minutes  COMPLICATIONS: None  ENDOSCOPIC IMPRESSION: 1.   Normal mucosa in the terminal ileum 2.   Two RECTAL POLYPS REMOVED 3.   Small internal  hemorrhoids   RECOMMENDATIONS: CONTINUE YOUR WEIGHT LOSS EFFORTS. CHEW ONE TUMS with meals TID FOLLOW A HIGH FIBER/LOW FAT DIET.  AVOID ITEMS THAT CAUSE BLOATING  GAS. BIOPSY RESULTS SHOULD BE BACK IN 7 DAYS. FOLLOW UP IN 3 MOS. Next colonoscopy in 10 years.       _______________________________ Rosalie DoctorJonette Eva, MD 09/21/2012 5:09 PM     PATIENT NAME:  Stephanie Cannon MR#: [id number]

## 2012-09-21 NOTE — H&P (Signed)
Primary Care Physician:  Syliva Overman, MD Primary Gastroenterologist:  Dr. Darrick Penna  Pre-Procedure History & Physical: HPI:  Stephanie Cannon is a 42 y.o. female here for  DIARRHEA.  Past Medical History  Diagnosis Date  . Obesity   . Carpal tunnel syndrome   . Hypertension   . GERD (gastroesophageal reflux disease)   . Chronic back pain   . Arthritis   . Hyperlipidemia     Past Surgical History  Procedure Laterality Date  . Abdominal hysterectomy    . Carpal tunnel release    . Cholecystectomy    . Tonsillectomy    . Cesarean section    . Ovarian tumor  2005    Prior to Admission medications   Medication Sig Start Date End Date Taking? Authorizing Provider  ALPRAZolam Prudy Feeler) 1 MG tablet Take 1 mg by mouth at bedtime as needed for sleep.   Yes Historical Provider, MD  cyclobenzaprine (FLEXERIL) 10 MG tablet Take 10 mg by mouth at bedtime.   Yes Historical Provider, MD  dicyclomine (BENTYL) 10 MG capsule Take 1 capsule (10 mg total) by mouth 4 (four) times daily -  before meals and at bedtime. 08/23/12  Yes Nira Retort, NP  esomeprazole (NEXIUM) 40 MG capsule Take 40 mg by mouth daily before breakfast.   Yes Historical Provider, MD  meloxicam (MOBIC) 15 MG tablet Take 15 mg by mouth daily.   Yes Historical Provider, MD  naproxen (NAPROSYN) 500 MG tablet Take 500 mg by mouth 2 (two) times daily with a meal.   Yes Historical Provider, MD  polyethylene glycol-electrolytes (TRILYTE) 420 G solution Take 4,000 mLs by mouth as directed. 08/23/12  Yes West Bali, MD  triamterene-hydrochlorothiazide (MAXZIDE-25) 37.5-25 MG per tablet Take 1.5 tablets by mouth daily.   Yes Historical Provider, MD    Allergies as of 08/27/2012 - Review Complete 08/23/2012  Allergen Reaction Noted  . Codeine  01/23/2008  . Morphine  01/23/2008    Family History  Problem Relation Age of Onset  . Hypertension Mother   . Hypertension Father   . Hypertension Brother   . Cancer Maternal Aunt    . Cancer Maternal Uncle     liver  . Cancer Paternal Uncle     bone cancer  . Diabetes Maternal Grandmother   . Colon cancer Neg Hx     History   Social History  . Marital Status: Married    Spouse Name: N/A    Number of Children: N/A  . 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 Smoking status: Former Games developer  . Smokeless tobacco: Not on file     Comment: quit smoking 2011  . Alcohol Use: Yes     Comment: occasionally  . Drug Use: No  . Sexual Activity: Not on file   Other Topics Concern  . Not on file   Social History Narrative  . No narrative on file    Review of Systems: See HPI, otherwise negative ROS   Physical Exam: BP 130/78  Pulse 84  Temp(Src) 98.3 F (36.8 C) (Oral)  Resp 15  Ht 5' (1.524 m)  Wt 285 lb (129.275 kg)  BMI 55.66 kg/m2  SpO2 100% General:   Alert,  pleasant and cooperative in NAD Head:  Normocephalic and atraumatic. Neck:  Supple; Lungs:  Clear throughout to auscultation.    Heart:  Regular  rate and rhythm. Abdomen:  Soft, nontender and nondistended. Normal bowel sounds, without guarding, and without rebound.   Neurologic:  Alert and  oriented x4;  grossly normal neurologically.  Impression/Plan:     Diarrhea  PLAN: TCS TODAY WITH BIOPSY

## 2012-09-25 ENCOUNTER — Encounter (HOSPITAL_COMMUNITY): Payer: Self-pay | Admitting: Gastroenterology

## 2012-10-03 ENCOUNTER — Telehealth: Payer: Self-pay | Admitting: Gastroenterology

## 2012-10-03 NOTE — Telephone Encounter (Signed)
LMOM for a return call.  

## 2012-10-03 NOTE — Telephone Encounter (Signed)
Please call pt. She had a polypoid lesion, removed and it was benign. Her RANDOM colon Bx are normal. HER DIARRHEA IS MOST LIKELY DUE TO NOT HAVING A GALLBLADDER.   CONTINUE YOUR WEIGHT LOSS EFFORTS.  FOLLOW A HIGH FIBER/LOW FAT DIET. AVOID ITEMS THAT CAUSE BLOATING & GAS.  CHEW ONE TUMS WITH MEALS 2-3 TIMES A DAY. TUMS CAN CAUSE CONSTIPATION. FOLLOW UP IN 3 MOS E30 DIARRHEA W/ SLF.   Next colonoscopy in 10 years.

## 2012-10-04 NOTE — Telephone Encounter (Signed)
Called and informed pt.  

## 2012-10-04 NOTE — Telephone Encounter (Signed)
Reminder in epic °

## 2012-10-10 ENCOUNTER — Telehealth: Payer: Self-pay | Admitting: Family Medicine

## 2012-10-10 NOTE — Telephone Encounter (Signed)
Spoke with patient. Answered her question about weight loss pill. Advised to call [facility] to see what their weight loss program consists of

## 2012-10-31 ENCOUNTER — Telehealth: Payer: Self-pay | Admitting: Family Medicine

## 2012-10-31 NOTE — Telephone Encounter (Signed)
The number at work her line is [phone] do not  leave a message she does not know yet how to retreive them

## 2012-10-31 NOTE — Telephone Encounter (Signed)
pls call pt and ask which level of her back is bothering her mid or low, I will send in the order after that , she can contact or will hear from referral staff about the appt time and date. Pls ask where she wants image, [facility] or [facility] and does she want an open MRI

## 2012-11-01 ENCOUNTER — Other Ambulatory Visit: Payer: Self-pay | Admitting: Family Medicine

## 2012-11-01 DIAGNOSIS — M519 Unspecified thoracic, thoracolumbar and lumbosacral intervertebral disc disorder: Secondary | ICD-10-CM

## 2012-11-01 DIAGNOSIS — M549 Dorsalgia, unspecified: Secondary | ICD-10-CM

## 2012-11-01 NOTE — Telephone Encounter (Signed)
Patient states that it is her lower back L4-L5 and that she would prefer to have the open MRI in [facility]

## 2012-11-01 NOTE — Telephone Encounter (Signed)
Referral for OPEN mRI entered pls schedule and let pt know appt info

## 2012-11-02 ENCOUNTER — Telehealth: Payer: Self-pay | Admitting: Family Medicine

## 2012-11-02 NOTE — Telephone Encounter (Signed)
she has had no back surgery so as far as I know that is not indicated, I will check with the radiologist and get back to you

## 2012-11-02 NOTE — Telephone Encounter (Signed)
I spoke with the radiologist, since she has had no surgery on her back, no indication for MRI with contrast

## 2012-11-06 ENCOUNTER — Other Ambulatory Visit: Payer: Self-pay | Admitting: Family Medicine

## 2012-11-08 ENCOUNTER — Other Ambulatory Visit: Payer: Self-pay

## 2012-11-13 ENCOUNTER — Other Ambulatory Visit: Payer: 59

## 2012-11-14 ENCOUNTER — Encounter: Payer: Self-pay | Admitting: Family Medicine

## 2012-11-14 ENCOUNTER — Ambulatory Visit (INDEPENDENT_AMBULATORY_CARE_PROVIDER_SITE_OTHER): Payer: 59 | Admitting: Family Medicine

## 2012-11-14 VITALS — BP 136/82 | HR 100 | Resp 18 | Ht 62.0 in | Wt 289.0 lb

## 2012-11-14 DIAGNOSIS — E785 Hyperlipidemia, unspecified: Secondary | ICD-10-CM

## 2012-11-14 DIAGNOSIS — I10 Essential (primary) hypertension: Secondary | ICD-10-CM

## 2012-11-14 DIAGNOSIS — F329 Major depressive disorder, single episode, unspecified: Secondary | ICD-10-CM

## 2012-11-14 DIAGNOSIS — K219 Gastro-esophageal reflux disease without esophagitis: Secondary | ICD-10-CM

## 2012-11-14 DIAGNOSIS — R7303 Prediabetes: Secondary | ICD-10-CM

## 2012-11-14 DIAGNOSIS — F3289 Other specified depressive episodes: Secondary | ICD-10-CM

## 2012-11-14 DIAGNOSIS — M549 Dorsalgia, unspecified: Secondary | ICD-10-CM

## 2012-11-14 DIAGNOSIS — R7302 Impaired glucose tolerance (oral): Secondary | ICD-10-CM

## 2012-11-14 DIAGNOSIS — R7309 Other abnormal glucose: Secondary | ICD-10-CM

## 2012-11-14 DIAGNOSIS — E8881 Metabolic syndrome: Secondary | ICD-10-CM

## 2012-11-14 MED ORDER — PHENTERMINE HCL 37.5 MG PO TABS: 37.5000 mg | ORAL_TABLET | Freq: Every day | ORAL | Status: DC

## 2012-11-14 NOTE — Patient Instructions (Signed)
F/u in 6 weeks, call if you need me before  You are referred for PT twice weekly for 6 weeks for uncontrolled and worsening back pain  Once daily anti inflammatory   Commit to change with eating, start with once daily phentermine

## 2012-11-14 NOTE — Progress Notes (Signed)
  Subjective:    Patient ID: Stephanie Cannon, female    DOB: 1970/10/27, 42 y.o.   MRN: [id number]  HPI  Pt reports that after standing for 10 mins she experiences 10 plus pain radiating to lower leg, just above the ankle, relieved only by sitting. Lying down may be comfortable for up 2 hours. At night pt takes naproxen, xanax and flexeril so she can sleep every night About 1 month ago she experienced severe pain on attempting to flex knee, she was unable to do so because of severe spasm Recently requested MRI to further evaluate , but per standard protocol, needs to follow through on PT and consistent treatment with re eval. Denies lower extremity numbness or weakness or incontinence. depressed  Due to extent of limitations in function from back pain , was seeing a chiropracter but no change. C/o ongoing weight gain, eating habits not controlled, most intake is in the evening, some of this due t IBS diarheah predominant   Review of Systems See HPI Denies recent fever or chills. Denies sinus pressure, nasal congestion, ear pain or sore throat. Denies chest congestion, productive cough or wheezing. Denies chest pains, palpitations and leg swelling  Denies dysuria, frequency, hesitancy or incontinence. Denies headaches, seizures, numbness, or tingling.  Denies skin break down or rash.        Objective:   Physical Exam  Patient alert and oriented and in no cardiopulmonary distress.  HEENT: No facial asymmetry, EOMI, no sinus tenderness,  oropharynx pink and moist.  Neck supple no adenopathy.  Chest: Clear to auscultation bilaterally.  CVS: S1, S2 no murmurs, no S3.  ABD: Soft non tender. Bowel sounds normal.  Ext: No edema  MS: Decreased  ROM lumbar  Spine,adequate in  shoulders, hips and knees.  Skin: Intact, no ulcerations or rash noted.  Psych: Good eye contact, normal affect. Memory intact , mildly  depressed appearing.  CNS: CN 2-12 intact, power, tone and  sensation normal throughout.       Assessment & Plan:

## 2012-11-15 ENCOUNTER — Other Ambulatory Visit: Payer: Self-pay

## 2012-11-15 MED ORDER — TRIAMTERENE-HCTZ 37.5-25 MG PO TABS: 1.5000 | ORAL_TABLET | Freq: Every day | ORAL | Status: DC

## 2012-11-18 NOTE — Assessment & Plan Note (Signed)
Increasingly debilitating , pt is essentially restricting activities outside of her work due to uncontrolled pain Refer to PT and work on weight loss

## 2012-11-18 NOTE — Assessment & Plan Note (Signed)
Controlled, no change in medication DASH diet and commitment to daily physical activity for a minimum of 30 minutes discussed and encouraged, as a part of hypertension management. The importance of attaining a healthy weight is also discussed.  

## 2012-11-18 NOTE — Assessment & Plan Note (Signed)
Deteriorated. Patient re-educated about  the importance of commitment to a  minimum of 150 minutes of exercise per week. The importance of healthy food choices with portion control discussed. Encouraged to start a food diary, count calories and to consider  joining a support group. Sample diet sheets offered. Goals set by the patient for the next several months. Resume phentermine    

## 2012-11-18 NOTE — Assessment & Plan Note (Signed)
Not suicidal or homicidal. Very frustrated by uncontrolled chronic back pain which is worsening

## 2012-11-18 NOTE — Assessment & Plan Note (Signed)
Controlled, no change in medication  

## 2012-11-18 NOTE — Assessment & Plan Note (Signed)
Pt a t increased CV risk, needs to change lifestyle and loose weight

## 2012-11-21 ENCOUNTER — Other Ambulatory Visit: Payer: 59

## 2012-11-22 ENCOUNTER — Other Ambulatory Visit: Payer: Self-pay | Admitting: Family Medicine

## 2012-11-22 ENCOUNTER — Other Ambulatory Visit: Payer: Self-pay

## 2012-11-22 DIAGNOSIS — Z139 Encounter for screening, unspecified: Secondary | ICD-10-CM

## 2012-11-22 MED ORDER — ALPRAZOLAM 1 MG PO TABS: 1.0000 mg | ORAL_TABLET | Freq: Every evening | ORAL | Status: DC | PRN

## 2012-11-28 ENCOUNTER — Encounter: Payer: Self-pay | Admitting: Gastroenterology

## 2012-12-03 ENCOUNTER — Ambulatory Visit (HOSPITAL_COMMUNITY): Payer: 59

## 2012-12-10 ENCOUNTER — Telehealth: Payer: Self-pay | Admitting: Family Medicine

## 2012-12-10 DIAGNOSIS — M549 Dorsalgia, unspecified: Secondary | ICD-10-CM

## 2012-12-10 MED ORDER — IBUPROFEN 800 MG PO TABS: ORAL_TABLET | ORAL | Status: DC

## 2012-12-10 NOTE — Telephone Encounter (Signed)
Please advise on refills.  

## 2012-12-10 NOTE — Telephone Encounter (Signed)
Spoke with pt states unable to f/u with PT based ion work schedule , I recommend ortho eval by Dr Shon Baton, she agress and I have entered a referral Ibuprofen sent in and she is aware

## 2012-12-11 ENCOUNTER — Ambulatory Visit (HOSPITAL_COMMUNITY): Payer: 59

## 2012-12-13 ENCOUNTER — Ambulatory Visit (HOSPITAL_COMMUNITY)
Admission: RE | Admit: 2012-12-13 | Discharge: 2012-12-13 | Disposition: A | Discharge status: Home / Self Care | Payer: 59 | Source: Ambulatory Visit | Attending: Family Medicine | Admitting: Family Medicine

## 2012-12-13 ENCOUNTER — Ambulatory Visit (HOSPITAL_COMMUNITY): Payer: 59

## 2012-12-13 ENCOUNTER — Encounter: Payer: Self-pay | Admitting: Gastroenterology

## 2012-12-13 DIAGNOSIS — Z1231 Encounter for screening mammogram for malignant neoplasm of breast: Secondary | ICD-10-CM | POA: Insufficient documentation

## 2012-12-13 DIAGNOSIS — Z139 Encounter for screening, unspecified: Secondary | ICD-10-CM

## 2012-12-14 ENCOUNTER — Ambulatory Visit (HOSPITAL_COMMUNITY): Payer: 59

## 2012-12-17 DIAGNOSIS — R7303 Prediabetes: Secondary | ICD-10-CM | POA: Insufficient documentation

## 2012-12-17 NOTE — Assessment & Plan Note (Signed)
Elevated total and LDL Hyperlipidemia:Low fat diet discussed and encouraged.  No meds at this point

## 2012-12-17 NOTE — Assessment & Plan Note (Signed)
Patient educated about the importance of limiting  Carbohydrate intake , the need to commit to daily physical activity for a minimum of 30 minutes , and to commit weight loss. The fact that changes in all these areas will reduce or eliminate all together the development of diabetes is stressed.    

## 2012-12-25 ENCOUNTER — Other Ambulatory Visit: Payer: Self-pay

## 2012-12-25 MED ORDER — CYCLOBENZAPRINE HCL 10 MG PO TABS: 10.0000 mg | ORAL_TABLET | Freq: Every day | ORAL | Status: DC

## 2012-12-31 ENCOUNTER — Telehealth: Payer: Self-pay | Admitting: Family Medicine

## 2012-12-31 ENCOUNTER — Encounter: Payer: Self-pay | Admitting: Family Medicine

## 2012-12-31 ENCOUNTER — Ambulatory Visit (INDEPENDENT_AMBULATORY_CARE_PROVIDER_SITE_OTHER): Payer: 59 | Admitting: Family Medicine

## 2012-12-31 VITALS — BP 136/82 | HR 97 | Resp 18 | Ht 62.0 in | Wt 284.0 lb

## 2012-12-31 DIAGNOSIS — E785 Hyperlipidemia, unspecified: Secondary | ICD-10-CM

## 2012-12-31 DIAGNOSIS — R7303 Prediabetes: Secondary | ICD-10-CM

## 2012-12-31 DIAGNOSIS — M549 Dorsalgia, unspecified: Secondary | ICD-10-CM

## 2012-12-31 DIAGNOSIS — E876 Hypokalemia: Secondary | ICD-10-CM

## 2012-12-31 DIAGNOSIS — E8881 Metabolic syndrome: Secondary | ICD-10-CM

## 2012-12-31 DIAGNOSIS — R7309 Other abnormal glucose: Secondary | ICD-10-CM

## 2012-12-31 DIAGNOSIS — F411 Generalized anxiety disorder: Secondary | ICD-10-CM

## 2012-12-31 DIAGNOSIS — R52 Pain, unspecified: Secondary | ICD-10-CM

## 2012-12-31 DIAGNOSIS — F419 Anxiety disorder, unspecified: Secondary | ICD-10-CM

## 2012-12-31 DIAGNOSIS — I10 Essential (primary) hypertension: Secondary | ICD-10-CM

## 2012-12-31 MED ORDER — PHENTERMINE HCL 37.5 MG PO TABS: 37.5000 mg | ORAL_TABLET | Freq: Every day | ORAL | Status: DC

## 2012-12-31 NOTE — Progress Notes (Signed)
   Subjective:    Patient ID: Stephanie Cannon, female    DOB: [birth year], 42 y.o.   MRN: [id number]  HPI The PT is here for follow up and re-evaluation of chronic medical conditions, medication management and review of any available recent lab and radiology data.  Preventive health is updated, specifically  Cancer screening and Immunization.  Needs pelvic exam will contact gyne The PT denies any adverse reactions to current medications since the last visit.  There are no new concerns.  C/o increased and uncontrolled generalized and joint pains , wants to be tested for rheumatoid arthritis and any other illness that could be contributing She has been able to maintain focus on dietary change since last visit, with a 5 pound weight loss. No adverse s/e with phentermine noted.     Review of Systems See HPI Denies recent fever or chills. Denies sinus pressure, nasal congestion, ear pain or sore throat. Denies chest congestion, productive cough or wheezing. Denies chest pains, palpitations and leg swelling Denies abdominal pain, nausea, vomiting,or constipation.  Has IBS diarheah predominant Denies dysuria, frequency, hesitancy or incontinence. C/o ongoing back pain as well as increased generalized and joint pains with hypersensitivity Denies headaches or  Seizures Denies depression does have anxiety and  Insomnia, which is controlled with medication Denies skin break down or rash.        Objective:   Physical Exam Patient alert and oriented and in no cardiopulmonary distress.  HEENT: No facial asymmetry, EOMI, no sinus tenderness,  oropharynx pink and moist.  Neck supple no adenopathy.  Chest: Clear to auscultation bilaterally.  CVS: S1, S2 no murmurs, no S3.  ABD: Soft non tender. Bowel sounds normal.  Ext: No edema  MS: decreased  ROM spine, generalized tenderness on palpation of large muscle groups as well as multiple tender points , behind neck, elbows and knees,  suggestive of fibromyalgia. No deformity of digits  Skin: Intact, no ulcerations or rash noted.  Psych: Good eye contact, normal affect. Memory intact not anxious or depressed appearing.  CNS: CN 2-12 intact, power, tone and sensation normal throughout.        Assessment & Plan:

## 2012-12-31 NOTE — Telephone Encounter (Signed)
Patient is aware 

## 2012-12-31 NOTE — Patient Instructions (Addendum)
F/u in 3.5 month, call if you need me before  HBA1C and chem 7  today.  CONGRATS on weight loss , keep it up, goal is 4 pounds per month.  New for blood sugar and to help with weight loss will be once daily metformin will decide dose based o lab result today.  Will wait on Dr Venetia Maxon to manage and direct back pain issues, specifically with regard to PT.  You will be referred to rheumatologist for evaluation of generalized pain and possible fibromyalgia.  Please call gyne and schedule an exam as deemed necessary by your gynecologist

## 2013-01-01 ENCOUNTER — Telehealth: Payer: Self-pay | Admitting: Family Medicine

## 2013-01-01 ENCOUNTER — Encounter: Payer: Self-pay | Admitting: Family Medicine

## 2013-01-01 DIAGNOSIS — E876 Hypokalemia: Secondary | ICD-10-CM | POA: Insufficient documentation

## 2013-01-01 LAB — BASIC METABOLIC PANEL
BUN: 12 mg/dL (ref 6–23)
Potassium: 2.9 mEq/L — ABNORMAL LOW (ref 3.5–5.3)

## 2013-01-01 LAB — HEMOGLOBIN A1C: Hgb A1c MFr Bld: 5.8 % — ABNORMAL HIGH (ref <5.7)

## 2013-01-01 MED ORDER — METFORMIN HCL ER 500 MG PO TB24: 500.0000 mg | ORAL_TABLET | Freq: Every day | ORAL | Status: DC

## 2013-01-01 NOTE — Telephone Encounter (Signed)
Pls see result note sent to pt, it is vital she starts the potassium today, call ensure she understands and send to pharmact she wants, alos order NOT STA chem 7 for 1/9or 1/10 and fax to lab let her know to go get this done. I have sent in the daily metformin to [facility] review wit her also pls

## 2013-01-01 NOTE — Addendum Note (Signed)
Addended by: Kandis Fantasia B on: 01/01/2013 11:02 AM   Modules accepted: Orders

## 2013-01-01 NOTE — Telephone Encounter (Signed)
Patient is aware and will start today.  Lab to be faxed to [facility].  Reminder letter to be mailed to patient for labs.

## 2013-01-01 NOTE — Telephone Encounter (Signed)
Called and left message for patient to return call.  

## 2013-01-02 ENCOUNTER — Other Ambulatory Visit: Payer: Self-pay

## 2013-01-02 DIAGNOSIS — E876 Hypokalemia: Secondary | ICD-10-CM

## 2013-01-02 MED ORDER — POTASSIUM CHLORIDE ER 10 MEQ PO TBCR: EXTENDED_RELEASE_TABLET | ORAL | Status: DC

## 2013-01-03 DIAGNOSIS — R52 Pain, unspecified: Secondary | ICD-10-CM | POA: Insufficient documentation

## 2013-01-03 NOTE — Assessment & Plan Note (Signed)
Unchanged, has appt later this week with spine specilist to examine feasible options

## 2013-01-03 NOTE — Assessment & Plan Note (Signed)
Patient advised to reduce carb and sweets, commit to regular physical activity, take meds as prescribed, test blood as directed, and attempt to lose weight, to improve blood sugar control.

## 2013-01-03 NOTE — Assessment & Plan Note (Signed)
Improved with change in job

## 2013-01-03 NOTE — Assessment & Plan Note (Signed)
Increased generalized pain with localized tender spots. Pt desires rheumatologic eval for clear diagnosis of pain issues, referral is made

## 2013-01-03 NOTE — Assessment & Plan Note (Signed)
Need for weigh loss and lifestyle change to reverse this and lower CV risk discussed

## 2013-01-03 NOTE — Assessment & Plan Note (Signed)
Controlled, no change in medication DASH diet and commitment to daily physical activity for a minimum of 30 minutes discussed and encouraged, as a part of hypertension management. The importance of attaining a healthy weight is also discussed.  

## 2013-01-03 NOTE — Assessment & Plan Note (Signed)
Improved. Pt applauded on succesful weight loss through lifestyle change, and encouraged to continue same. Weight loss goal set for the next several months.  

## 2013-01-03 NOTE — Assessment & Plan Note (Signed)
Hyperlipidemia:Low fat diet discussed and encouraged.  Updated lab in 4 month

## 2013-02-13 ENCOUNTER — Other Ambulatory Visit: Payer: Self-pay | Admitting: Family Medicine

## 2013-02-20 ENCOUNTER — Other Ambulatory Visit: Payer: Self-pay

## 2013-02-20 ENCOUNTER — Telehealth: Payer: Self-pay | Admitting: Family Medicine

## 2013-02-20 MED ORDER — PHENTERMINE HCL 37.5 MG PO TABS: 37.5000 mg | ORAL_TABLET | Freq: Every day | ORAL | Status: DC

## 2013-02-20 MED ORDER — CYCLOBENZAPRINE HCL 10 MG PO TABS: 10.0000 mg | ORAL_TABLET | Freq: Every day | ORAL | Status: DC

## 2013-02-20 NOTE — Telephone Encounter (Signed)
Sent to [facility] as requested

## 2013-03-22 ENCOUNTER — Other Ambulatory Visit: Payer: Self-pay

## 2013-03-22 MED ORDER — CYCLOBENZAPRINE HCL 10 MG PO TABS: 10.0000 mg | ORAL_TABLET | Freq: Every day | ORAL | Status: DC

## 2013-03-26 NOTE — Progress Notes (Signed)
REVIEWED. TCS SEP 2014 NL COLON Bx, BENIGN POLYPOID LESION. CONTACT PT TO [facility] OPV DX: DIARRHEA.

## 2013-03-28 ENCOUNTER — Encounter: Payer: Self-pay | Admitting: Gastroenterology

## 2013-03-28 NOTE — Progress Notes (Signed)
Mailed letter for patient to call office to set up OV °

## 2013-04-03 ENCOUNTER — Ambulatory Visit (INDEPENDENT_AMBULATORY_CARE_PROVIDER_SITE_OTHER): Payer: BC Managed Care – PPO | Admitting: Family Medicine

## 2013-04-03 ENCOUNTER — Encounter: Payer: Self-pay | Admitting: Family Medicine

## 2013-04-03 VITALS — BP 120/80 | HR 88 | Resp 18 | Wt 282.0 lb

## 2013-04-03 DIAGNOSIS — R7303 Prediabetes: Secondary | ICD-10-CM

## 2013-04-03 DIAGNOSIS — K219 Gastro-esophageal reflux disease without esophagitis: Secondary | ICD-10-CM

## 2013-04-03 DIAGNOSIS — E785 Hyperlipidemia, unspecified: Secondary | ICD-10-CM

## 2013-04-03 DIAGNOSIS — E8881 Metabolic syndrome: Secondary | ICD-10-CM

## 2013-04-03 DIAGNOSIS — I10 Essential (primary) hypertension: Secondary | ICD-10-CM

## 2013-04-03 DIAGNOSIS — M549 Dorsalgia, unspecified: Secondary | ICD-10-CM

## 2013-04-03 DIAGNOSIS — F419 Anxiety disorder, unspecified: Secondary | ICD-10-CM

## 2013-04-03 DIAGNOSIS — R197 Diarrhea, unspecified: Secondary | ICD-10-CM

## 2013-04-03 DIAGNOSIS — R7309 Other abnormal glucose: Secondary | ICD-10-CM

## 2013-04-03 DIAGNOSIS — Q828 Other specified congenital malformations of skin: Secondary | ICD-10-CM

## 2013-04-03 DIAGNOSIS — F411 Generalized anxiety disorder: Secondary | ICD-10-CM

## 2013-04-03 NOTE — Assessment & Plan Note (Signed)
Controlled, no change in medication DASH diet and commitment to daily physical activity for a minimum of 30 minutes discussed and encouraged, as a part of hypertension management. The importance of attaining a healthy weight is also discussed.  

## 2013-04-03 NOTE — Assessment & Plan Note (Signed)
The increased risk of cardiovascular disease associated with this diagnosis, and the need to consistently work on lifestyle to change this is discussed. Following  a  heart healthy diet ,commitment to 30 minutes of exercise at least 5 days per week, as well as control of blood sugar and cholesterol , and achieving a healthy weight are all the areas to be addressed .  

## 2013-04-03 NOTE — Patient Instructions (Signed)
F/u in 4 month, call if you need me before  Weight loss goal of 4 pounds per month   HBA1C, fasting lipid cmp , cBC in 4 month, just before next visit  Blood pressure is excellety  Ninety day supplies of medication will be sent in  One phentermine daily, eat on a regular schedule

## 2013-04-03 NOTE — Assessment & Plan Note (Signed)
Unchanged, relies on bedtime xanax for sleep

## 2013-04-03 NOTE — Assessment & Plan Note (Signed)
Hyperlipidemia:Low fat diet discussed and encouraged.  Updated lab needed at/ before next visit.  

## 2013-04-03 NOTE — Assessment & Plan Note (Signed)
Skin tags x 2 under left and alos right breast. Pt reassured of benign nature, she will have them removed when ready

## 2013-04-03 NOTE — Assessment & Plan Note (Signed)
Reports slight improvement, continue current meds. Weight loss encouraged to help with this

## 2013-04-03 NOTE — Assessment & Plan Note (Signed)
Unchanged, uses imodium as neded

## 2013-04-03 NOTE — Progress Notes (Signed)
Subjective:    Patient ID: Stephanie Cannon, female    DOB: 1970/04/10, 43 y.o.   MRN: [id number]  HPI The PT is here for follow up and re-evaluation of chronic medical conditions, medication management and review of any available recent lab and radiology data.  Preventive health is updated, specifically  Cancer screening and Immunization.Needs gyne exam, has stated she will contact gyne in the past, she is s/p TAH and BSO   . The PT denies any adverse reactions to current medications since the last visit. Has not been taking phentermine consistently, started in past 2 weeks also started walking in past 1 week, plans to lose 4 pounds per month with the help of phentermine There are no new concerns. Disabling diarheah persists, eating habits remain poor , states she eats once per day, afraid to eat more often due to loose stool  Back pain slightly improved, relies on flexeril at bedtime and uses ibuprofen as needed       Review of Systems    See HPI Denies recent fever or chills. Denies sinus pressure, nasal congestion, ear pain or sore throat. Denies chest congestion, productive cough or wheezing. Denies chest pains, palpitations and leg swelling Denies abdominal pain, nausea, vomiting,diarrhea or constipation.   Denies dysuria, frequency, hesitancy or incontinence. Chronic back pain  and limitation in mobility. Denies headaches, seizures, numbness, or tingling. Denies depression, has chronic  anxiety and  Insomnia relies on xanax at bedtime Denies skin break down or rash.c/o 2 skin tags on anterior chest under breasts, wants them removed, will call dermatology when ready     Objective:   Physical Exam  BP 120/80  Pulse 88  Resp 18  Wt 282 lb (127.914 kg)  SpO2 97% Patient alert and oriented and in no cardiopulmonary distress.  HEENT: No facial asymmetry, EOMI, no sinus tenderness,  oropharynx pink and moist.  Neck supple no adenopathy.  Chest: Clear to auscultation  bilaterally.  CVS: S1, S2 no murmurs, no S3.  ABD: Soft non tender. Bowel sounds normal.  Ext: No edema  MS: Adequate though reduced ROM spine,  hips and knees.  Skin: Intact, no ulcerations or rash noted.Skin tags x 2 noted under left and right breasts  Psych: Good eye contact, normal affect. Memory intact not anxious or depressed appearing.  CNS: CN 2-12 intact, power, tone and sensation normal throughout.       Assessment & Plan:  HYPERTENSION Controlled, no change in medication DASH diet and commitment to daily physical activity for a minimum of 30 minutes discussed and encouraged, as a part of hypertension management. The importance of attaining a healthy weight is also discussed.   Prediabetes Patient educated about the importance of limiting  Carbohydrate intake , the need to commit to daily physical activity for a minimum of 30 minutes , and to commit weight loss. The fact that changes in all these areas will reduce or eliminate all together the development of diabetes is stressed.   Updated lab needed at/ before next visit.   Hyperlipidemia Hyperlipidemia:Low fat diet discussed and encouraged.  Updated lab needed at/ before next visit.   Metabolic syndrome X The increased risk of cardiovascular disease associated with this diagnosis, and the need to consistently work on lifestyle to change this is discussed. Following  a  heart healthy diet ,commitment to 30 minutes of exercise at least 5 days per week, as well as control of blood sugar and cholesterol , and achieving a healthy weight  are all the areas to be addressed .   Morbid obesity Unchnaged Patient re-educated about  the importance of commitment to a  minimum of 150 minutes of exercise per week. The importance of healthy food choices with portion control discussed. Encouraged to start a food diary, count calories and to consider  joining a support group. Sample diet sheets offered. Goals set by the  patient for the next several months.     GERD Controlled, no change in medication   Diarrhea Unchanged, uses imodium as neded  BACK PAIN, CHRONIC Reports slight improvement, continue current meds. Weight loss encouraged to help with this  Anxiety Unchanged, relies on bedtime xanax for sleep  Accessory skin tags Skin tags x 2 under left and alos right breast. Pt reassured of benign nature, she will have them removed when ready

## 2013-04-03 NOTE — Assessment & Plan Note (Signed)
Controlled, no change in medication  

## 2013-04-03 NOTE — Assessment & Plan Note (Signed)
Unchnaged. Patient re-educated about  the importance of commitment to a  minimum of 150 minutes of exercise per week. The importance of healthy food choices with portion control discussed. Encouraged to start a food diary, count calories and to consider  joining a support group. Sample diet sheets offered. Goals set by the patient for the next several months.    

## 2013-04-03 NOTE — Assessment & Plan Note (Signed)
Patient educated about the importance of limiting  Carbohydrate intake , the need to commit to daily physical activity for a minimum of 30 minutes , and to commit weight loss. The fact that changes in all these areas will reduce or eliminate all together the development of diabetes is stressed.   Updated lab needed at/ before next visit.  

## 2013-07-15 ENCOUNTER — Telehealth: Payer: Self-pay | Admitting: Family Medicine

## 2013-07-15 ENCOUNTER — Other Ambulatory Visit: Payer: Self-pay | Admitting: Family Medicine

## 2013-07-15 NOTE — Telephone Encounter (Signed)
Please advise if you agree with this.

## 2013-07-15 NOTE — Telephone Encounter (Signed)
Will address at oV, unsure if anything has changed, lpet her know

## 2013-07-15 NOTE — Telephone Encounter (Signed)
Patient aware.

## 2013-07-18 ENCOUNTER — Encounter: Payer: Self-pay | Admitting: Family Medicine

## 2013-07-18 ENCOUNTER — Ambulatory Visit (INDEPENDENT_AMBULATORY_CARE_PROVIDER_SITE_OTHER): Payer: BC Managed Care – PPO | Admitting: Family Medicine

## 2013-07-18 VITALS — BP 110/70 | HR 97 | Resp 18 | Ht 62.0 in | Wt 285.0 lb

## 2013-07-18 DIAGNOSIS — R200 Anesthesia of skin: Secondary | ICD-10-CM

## 2013-07-18 DIAGNOSIS — Z23 Encounter for immunization: Secondary | ICD-10-CM

## 2013-07-18 DIAGNOSIS — R7309 Other abnormal glucose: Secondary | ICD-10-CM

## 2013-07-18 DIAGNOSIS — E8881 Metabolic syndrome: Secondary | ICD-10-CM

## 2013-07-18 DIAGNOSIS — N3942 Incontinence without sensory awareness: Secondary | ICD-10-CM

## 2013-07-18 DIAGNOSIS — M549 Dorsalgia, unspecified: Secondary | ICD-10-CM

## 2013-07-18 DIAGNOSIS — R7303 Prediabetes: Secondary | ICD-10-CM

## 2013-07-18 DIAGNOSIS — R29898 Other symptoms and signs involving the musculoskeletal system: Secondary | ICD-10-CM

## 2013-07-18 DIAGNOSIS — R209 Unspecified disturbances of skin sensation: Secondary | ICD-10-CM

## 2013-07-18 DIAGNOSIS — F419 Anxiety disorder, unspecified: Secondary | ICD-10-CM

## 2013-07-18 DIAGNOSIS — M545 Low back pain, unspecified: Secondary | ICD-10-CM

## 2013-07-18 DIAGNOSIS — F411 Generalized anxiety disorder: Secondary | ICD-10-CM

## 2013-07-18 DIAGNOSIS — I10 Essential (primary) hypertension: Secondary | ICD-10-CM

## 2013-07-18 DIAGNOSIS — R197 Diarrhea, unspecified: Secondary | ICD-10-CM

## 2013-07-18 DIAGNOSIS — E785 Hyperlipidemia, unspecified: Secondary | ICD-10-CM

## 2013-07-18 DIAGNOSIS — M79605 Pain in left leg: Secondary | ICD-10-CM

## 2013-07-18 MED ORDER — CYCLOBENZAPRINE HCL 10 MG PO TABS: 10.0000 mg | ORAL_TABLET | Freq: Every day | ORAL | Status: DC

## 2013-07-18 MED ORDER — KETOROLAC TROMETHAMINE 60 MG/2ML IM SOLN
60.0000 mg | Freq: Once | INTRAMUSCULAR | Status: AC
  Administered 2013-07-18: 60 mg via INTRAMUSCULAR

## 2013-07-18 MED ORDER — GABAPENTIN 100 MG PO CAPS
100.0000 mg | ORAL_CAPSULE | Freq: Three times a day (TID) | ORAL | Status: DC
Start: 2013-07-18 — End: 2013-07-18

## 2013-07-18 MED ORDER — METHYLPREDNISOLONE ACETATE 80 MG/ML IJ SUSP
80.0000 mg | Freq: Once | INTRAMUSCULAR | Status: AC
  Administered 2013-07-18: 80 mg via INTRAMUSCULAR

## 2013-07-18 MED ORDER — PREDNISONE (PAK) 5 MG PO TABS: 5.0000 mg | ORAL_TABLET | ORAL | Status: DC

## 2013-07-18 MED ORDER — GABAPENTIN 100 MG PO CAPS: 100.0000 mg | ORAL_CAPSULE | Freq: Three times a day (TID) | ORAL | Status: DC

## 2013-07-18 MED ORDER — LORAZEPAM 1 MG PO TABS: ORAL_TABLET | ORAL | Status: DC

## 2013-07-18 NOTE — Patient Instructions (Addendum)
F/u in 4 month, call if you need me before   TdAp , toradol 60mg  and depo medrol 80 mg Im are being given  Prednisone dose pack prescribed for 6 days  New for pain is gabapentin 100mg  , prescribed 3 times daily  Start one at night, increase every 3 to 5 days if needed up to three at night this is good for nerve pain  You are referred for MRI of your low back based on symptoms and exam  BP today is good.  Trade bread, rice and pasta for vegetables please  Fasting lipid, cmp , hBa1C in 4 month

## 2013-07-18 NOTE — Progress Notes (Signed)
   Subjective:    Patient ID: Stephanie Cannon, female    DOB: [birth year], 43 y.o.   MRN: [id number]  HPI The PT is here for follow up and re-evaluation of chronic medical conditions, medication management and review of any available recent lab and radiology data.  Preventive health is updated, specifically  Cancer screening and Immunization.   Uncontrolled daily back pain radiating to posrterior left mid calf, keeps her awake at night, unableto stand for more than 20mins without pain 2 month h/o left anterior thigh numbness, shifting weight to right leg due to pain in left and left leg is weakening, also has noted urinary incontinence in past 3 months, difficulty determining when she needs to go to bathroom, and is experiencing increased near wetting incidents Wants to try lyrica or similar for relief, states needs to keep handicap sticker going as she is unable to walk for more than 10 to 15 mins without needing to rest     Review of Systems See HPI Denies recent fever or chills. Denies sinus pressure, nasal congestion, ear pain or sore throat. Denies chest congestion, productive cough or wheezing. Denies chest pains, palpitations and leg swelling Chronic disabling diarrhea unchanged Denies dysuria, frequency, hesitancy .  Denies headaches or  Seizures.  Denies depression, uncontrolled  anxiety does have  Insomnia mainly due to pain in the low back, buttock and posterior thigh  Denies skin break down or rash.        Objective:   Physical Exam  BP 110/70  Pulse 97  Resp 18  Ht 5\' 2"  (1.575 m)  Wt 285 lb (129.275 kg)  BMI 52.11 kg/m2  SpO2 98%  Patient alert and oriented and in no cardiopulmonary distress.  HEENT: No facial asymmetry, EOMI,   oropharynx pink and moist.  Neck supple no JVD, no mass.  Chest: Clear to auscultation bilaterally.  CVS: S1, S2 no murmurs, no S3.Regular rate.  ABD: Soft non tender.   Ext: No edema  MS: Adequate though reduced  ROM lumbar   spine, normal ROM shoulders, hips and knees.  Skin: Intact, no ulcerations or rash noted.  Psych: Good eye contact, normal affect. Memory intact not anxious or depressed appearing.  CNS: CN 2-12 intact, grade 4 power in left thigh, and decreased sensation  in left anterior thigh      Assessment & Plan:

## 2013-07-19 DIAGNOSIS — N3942 Incontinence without sensory awareness: Secondary | ICD-10-CM | POA: Insufficient documentation

## 2013-07-19 NOTE — Assessment & Plan Note (Signed)
The increased risk of cardiovascular disease associated with this diagnosis, and the need to consistently work on lifestyle to change this is discussed. Following  a  heart healthy diet ,commitment to 30 minutes of exercise at least 5 days per week, as well as control of blood sugar and cholesterol , and achieving a healthy weight are all the areas to be addressed .  

## 2013-07-19 NOTE — Assessment & Plan Note (Signed)
Controlled, no change in medication  

## 2013-07-19 NOTE — Assessment & Plan Note (Signed)
Deteriorated. Patient re-educated about  the importance of commitment to a  minimum of 150 minutes of exercise per week. The importance of healthy food choices with portion control discussed. Encouraged to start a food diary, count calories and to consider  joining a support group. Sample diet sheets offered. Goals set by the patient for the next several months.    

## 2013-07-19 NOTE — Assessment & Plan Note (Addendum)
Progressively worsening and debilitating pain , keeps patient awake at niight, limits ability to stand and walk for over 10 mins, associated with numbness in left thigh anterior and weakness in left lower extremity and urinary incontinence. Needs repeat imaging based on symptoms and exam findings Toradol and depo medrol i office then prednisone dose pack, trial of gabapentin also

## 2013-07-19 NOTE — Assessment & Plan Note (Signed)
Newly developing symptom in past 3 to 4 months, concerning for the possibility of nerve compression

## 2013-07-19 NOTE — Assessment & Plan Note (Signed)
Unchanged and limits pt's ability to be involved in social situations

## 2013-07-26 ENCOUNTER — Ambulatory Visit
Admission: RE | Admit: 2013-07-26 | Discharge: 2013-07-26 | Disposition: A | Discharge status: Home / Self Care | Payer: BC Managed Care – PPO | Source: Ambulatory Visit | Attending: Family Medicine | Admitting: Family Medicine

## 2013-07-26 DIAGNOSIS — R29898 Other symptoms and signs involving the musculoskeletal system: Secondary | ICD-10-CM

## 2013-07-26 DIAGNOSIS — N3942 Incontinence without sensory awareness: Secondary | ICD-10-CM

## 2013-07-26 DIAGNOSIS — M545 Low back pain, unspecified: Secondary | ICD-10-CM

## 2013-07-26 DIAGNOSIS — R200 Anesthesia of skin: Secondary | ICD-10-CM

## 2013-07-26 DIAGNOSIS — M79605 Pain in left leg: Secondary | ICD-10-CM

## 2013-07-29 ENCOUNTER — Other Ambulatory Visit: Payer: Self-pay

## 2013-07-29 ENCOUNTER — Telehealth: Payer: Self-pay | Admitting: Family Medicine

## 2013-07-29 DIAGNOSIS — M79605 Pain in left leg: Secondary | ICD-10-CM

## 2013-07-29 DIAGNOSIS — M545 Low back pain, unspecified: Secondary | ICD-10-CM

## 2013-07-29 NOTE — Telephone Encounter (Signed)
Patient called and notified of her results. 

## 2013-08-02 ENCOUNTER — Other Ambulatory Visit: Payer: Self-pay

## 2013-08-02 MED ORDER — PANTOPRAZOLE SODIUM 40 MG PO TBEC: 40.0000 mg | DELAYED_RELEASE_TABLET | Freq: Every day | ORAL | Status: DC

## 2013-08-02 MED ORDER — ESOMEPRAZOLE MAGNESIUM 20 MG PO CPDR: 20.0000 mg | DELAYED_RELEASE_CAPSULE | Freq: Every day | ORAL | Status: DC

## 2013-08-06 ENCOUNTER — Ambulatory Visit: Payer: BC Managed Care – PPO | Admitting: Family Medicine

## 2013-08-13 ENCOUNTER — Other Ambulatory Visit: Payer: Self-pay | Admitting: Family Medicine

## 2013-08-13 ENCOUNTER — Other Ambulatory Visit: Payer: Self-pay

## 2013-08-13 ENCOUNTER — Telehealth: Payer: Self-pay

## 2013-08-13 MED ORDER — ESOMEPRAZOLE MAGNESIUM 20 MG PO CPDR: 20.0000 mg | DELAYED_RELEASE_CAPSULE | Freq: Every day | ORAL | Status: DC

## 2013-08-13 MED ORDER — ALPRAZOLAM 1 MG PO TABS: ORAL_TABLET | ORAL | Status: DC

## 2013-08-13 NOTE — Telephone Encounter (Signed)
Med refilled.

## 2013-09-25 ENCOUNTER — Other Ambulatory Visit: Payer: Self-pay | Admitting: Family Medicine

## 2013-10-22 ENCOUNTER — Other Ambulatory Visit: Payer: Self-pay | Admitting: Family Medicine

## 2013-10-22 DIAGNOSIS — Z1231 Encounter for screening mammogram for malignant neoplasm of breast: Secondary | ICD-10-CM

## 2013-10-31 ENCOUNTER — Ambulatory Visit (INDEPENDENT_AMBULATORY_CARE_PROVIDER_SITE_OTHER): Payer: BC Managed Care – PPO | Admitting: Family Medicine

## 2013-10-31 ENCOUNTER — Telehealth: Payer: Self-pay

## 2013-10-31 ENCOUNTER — Ambulatory Visit (HOSPITAL_COMMUNITY)
Admission: RE | Admit: 2013-10-31 | Discharge: 2013-10-31 | Disposition: A | Discharge status: Home / Self Care | Payer: BC Managed Care – PPO | Source: Ambulatory Visit | Attending: Family Medicine | Admitting: Family Medicine

## 2013-10-31 ENCOUNTER — Encounter: Payer: Self-pay | Admitting: Family Medicine

## 2013-10-31 VITALS — BP 116/70 | HR 100 | Temp 98.7°F | Resp 18 | Ht 62.0 in | Wt 295.1 lb

## 2013-10-31 DIAGNOSIS — J209 Acute bronchitis, unspecified: Secondary | ICD-10-CM

## 2013-10-31 DIAGNOSIS — E785 Hyperlipidemia, unspecified: Secondary | ICD-10-CM

## 2013-10-31 DIAGNOSIS — R7303 Prediabetes: Secondary | ICD-10-CM

## 2013-10-31 DIAGNOSIS — R51 Headache: Secondary | ICD-10-CM | POA: Diagnosis present

## 2013-10-31 DIAGNOSIS — J0101 Acute recurrent maxillary sinusitis: Secondary | ICD-10-CM

## 2013-10-31 DIAGNOSIS — J0100 Acute maxillary sinusitis, unspecified: Secondary | ICD-10-CM | POA: Insufficient documentation

## 2013-10-31 DIAGNOSIS — R7309 Other abnormal glucose: Secondary | ICD-10-CM

## 2013-10-31 DIAGNOSIS — J40 Bronchitis, not specified as acute or chronic: Secondary | ICD-10-CM | POA: Insufficient documentation

## 2013-10-31 DIAGNOSIS — I10 Essential (primary) hypertension: Secondary | ICD-10-CM

## 2013-10-31 DIAGNOSIS — Q799 Congenital malformation of musculoskeletal system, unspecified: Secondary | ICD-10-CM | POA: Insufficient documentation

## 2013-10-31 MED ORDER — BENZONATATE 100 MG PO CAPS: 100.0000 mg | ORAL_CAPSULE | Freq: Two times a day (BID) | ORAL | Status: DC | PRN

## 2013-10-31 MED ORDER — PREDNISONE (PAK) 5 MG PO TABS: 5.0000 mg | ORAL_TABLET | ORAL | Status: DC

## 2013-10-31 MED ORDER — LEVOFLOXACIN 500 MG PO TABS: 500.0000 mg | ORAL_TABLET | Freq: Every day | ORAL | Status: DC

## 2013-10-31 MED ORDER — FLUCONAZOLE 150 MG PO TABS: ORAL_TABLET | ORAL | Status: DC

## 2013-10-31 MED ORDER — CEFTRIAXONE SODIUM 500 MG IJ SOLR
500.0000 mg | Freq: Once | INTRAMUSCULAR | Status: AC
  Administered 2013-10-31: 500 mg via INTRAMUSCULAR

## 2013-10-31 MED ORDER — PROMETHAZINE-DM 6.25-15 MG/5ML PO SYRP: ORAL_SOLUTION | ORAL | Status: DC

## 2013-10-31 MED ORDER — ALBUTEROL SULFATE HFA 108 (90 BASE) MCG/ACT IN AERS: 2.0000 | INHALATION_SPRAY | Freq: Four times a day (QID) | RESPIRATORY_TRACT | Status: DC | PRN

## 2013-10-31 NOTE — Assessment & Plan Note (Signed)
Controlled, no change in medication  

## 2013-10-31 NOTE — Patient Instructions (Signed)
Keep December follow up  You are treated for acute sinusitis and bronchitis.  Please get Xrays today.  Medications are sent in start today  Work note from 12/26 to return 11/04/2013  HBA1C, fasting lipid and cmp for Decmber visit, pls get BEFORE , 2 to 4 days   Rocephin in office today

## 2013-10-31 NOTE — Telephone Encounter (Signed)
Noted and typed.

## 2013-10-31 NOTE — Progress Notes (Signed)
   Subjective:    Patient ID: Stephanie Cannon, female    DOB: October 04, 1970, 43 y.o.   MRN: [id number]  HPI 5 day  h/o worsening head and chest congestion, associated with fever and chills intermittently. Nasal drainage has thickened , and is yellowish green, and at times bloody. Sputum is thick and yellow. C/o bilateral ear pressure, denies hearing loss and sore throat. Increasing fatigue , poor appetitie and sleep disturbed by cough. 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   Denies dysuria, frequency, hesitancy or incontinence. Chronic disabling back pain  and limitation in mobility. Denies headaches, seizures, numbness, or tingling. Denies depression, anxiety , has insomnia due to excess cough, wants med for this Denies skin break down or rash.        Objective:   Physical Exam  BP 116/70  Pulse 100  Temp(Src) 98.7 F (37.1 C)  Resp 18  Ht 5\' 2"  (1.575 m)  Wt 295 lb 1.3 oz (133.847 kg)  BMI 53.96 kg/m2  SpO2 98%   Patient alert and oriented and in  Mild pulmonary distress, excessive cough, ill appearing  HEENT: No facial asymmetry, EOMI,   oropharynx pink and moist.  Neck supple no JVD, no mass. TM clear, left maxillary sinus is tender, bilateral anterior cervial adenitis, left greater than right, TM clear bilaterally Chest: Adequate air entry, scattered crackles , no wheezes  CVS: S1, S2 no murmurs, no S3.Regular rate.  ABD: Soft non tender.   Ext: No edema  MS: Adequate though reduced  ROM spine,  Normal in shoulders, hips and knees.  Skin: Intact, no ulcerations or rash noted.  Psych: Good eye contact, normal affect. Memory intact not anxious or depressed  appearing.  CNS: CN 2-12 intact, power,  normal throughout.no focal deficits noted.       Assessment & Plan:  Essential hypertension Controlled, no change in medication   Acute bronchitis 5 day history, rocephin i office followed by levaquin, tessalon perles , phenergan DM , also needs CXR today  Maxillary sinusitis, acute antibiotic and xray sinus , also fluconazole for as needed use  Morbid obesity Deteriorating, absolutely needs surgical intervention for weight management, Letter of recommendation given to pt at visit She is encouraged  To change diet and commit to exercise regularly also

## 2013-10-31 NOTE — Telephone Encounter (Signed)
Noted, so pls type as I have outlined the needed content. Also add she has tried over past 5 years with dietary change , appetite suppressant on intermittent basis , and exeercise as able , with no success

## 2013-10-31 NOTE — Telephone Encounter (Signed)
Explain the weight loss surgery protocol, not seeing where letter of assistance , but if will help he r, insurance wise oroother [pls type morbidly obese, co morbities to include HTN and pre diabetes and uncontrolled back pain , as the reasons I will signe OK pred 5mg  dose pack #21 only

## 2013-10-31 NOTE — Addendum Note (Signed)
Addended by: Denman George B on: 10/31/2013 11:37 AM   Modules accepted: Orders

## 2013-10-31 NOTE — Telephone Encounter (Signed)
Prednisone sent to pharmacy

## 2013-10-31 NOTE — Assessment & Plan Note (Signed)
5 day history, rocephin i office followed by levaquin, tessalon perles , phenergan DM , also needs CXR today

## 2013-10-31 NOTE — Telephone Encounter (Signed)
Patient has started the process through [facility].  She states that she needs the letter for insurance purposes.

## 2013-10-31 NOTE — Assessment & Plan Note (Signed)
Deteriorating, absolutely needs surgical intervention for weight management, Letter of recommendation given to pt at visit She is encouraged  To change diet and commit to exercise regularly also

## 2013-10-31 NOTE — Assessment & Plan Note (Signed)
antibiotic and xray sinus , also fluconazole for as needed use

## 2013-11-04 ENCOUNTER — Telehealth: Payer: Self-pay

## 2013-11-04 NOTE — Telephone Encounter (Signed)
Noted and letter will be faxed to requested #

## 2013-11-13 ENCOUNTER — Telehealth: Payer: Self-pay | Admitting: Family Medicine

## 2013-11-13 MED ORDER — ALPRAZOLAM 1 MG PO TABS
ORAL_TABLET | ORAL | Status: DC
Start: 2013-11-13 — End: 2014-02-10

## 2013-11-13 MED ORDER — CYCLOBENZAPRINE HCL 10 MG PO TABS
10.0000 mg | ORAL_TABLET | Freq: Every day | ORAL | Status: DC
Start: 2013-11-13 — End: 2014-01-24

## 2013-11-13 NOTE — Telephone Encounter (Signed)
meds sent

## 2013-11-25 ENCOUNTER — Telehealth: Payer: Self-pay

## 2013-11-25 NOTE — Telephone Encounter (Signed)
Wants to know if she could try the weight loss drug Belviq. One of her friends has had success with it. Please advise

## 2013-11-26 ENCOUNTER — Other Ambulatory Visit: Payer: Self-pay | Admitting: Family Medicine

## 2013-11-26 MED ORDER — LORCASERIN HCL 10 MG PO TABS: 1.0000 | ORAL_TABLET | Freq: Two times a day (BID) | ORAL | Status: DC

## 2013-11-26 NOTE — Telephone Encounter (Signed)
Needs to come in and weight before starting med.and this needs to be documented in her medical record  Needs to commit to change in eating to facilitate weight loss, offer nutrtion appt if covered  Needs to commit to daily exercise as able  Needs to commit to 64 oz water  NEEDS FLU VACCINE, should get this when shhe comes for script May pick up script,  Needs to keep Dec appt here

## 2013-11-26 NOTE — Telephone Encounter (Signed)
Patient aware- if I can get PA approved- She will come in to weight before she starts med

## 2013-11-26 NOTE — Telephone Encounter (Signed)
Called patient and left message for them to return call at the office   

## 2013-12-23 ENCOUNTER — Encounter: Payer: Self-pay | Admitting: Family Medicine

## 2013-12-23 ENCOUNTER — Ambulatory Visit (INDEPENDENT_AMBULATORY_CARE_PROVIDER_SITE_OTHER): Payer: Self-pay | Admitting: Family Medicine

## 2013-12-23 VITALS — BP 134/84 | HR 100 | Resp 18 | Wt 298.1 lb

## 2013-12-23 DIAGNOSIS — M79605 Pain in left leg: Secondary | ICD-10-CM

## 2013-12-23 DIAGNOSIS — M545 Low back pain, unspecified: Secondary | ICD-10-CM

## 2013-12-23 DIAGNOSIS — R7303 Prediabetes: Secondary | ICD-10-CM

## 2013-12-23 DIAGNOSIS — E785 Hyperlipidemia, unspecified: Secondary | ICD-10-CM

## 2013-12-23 DIAGNOSIS — K219 Gastro-esophageal reflux disease without esophagitis: Secondary | ICD-10-CM

## 2013-12-23 DIAGNOSIS — E8881 Metabolic syndrome: Secondary | ICD-10-CM

## 2013-12-23 DIAGNOSIS — R7309 Other abnormal glucose: Secondary | ICD-10-CM

## 2013-12-23 DIAGNOSIS — I10 Essential (primary) hypertension: Secondary | ICD-10-CM

## 2013-12-23 MED ORDER — NEXIUM 40 MG PO CPDR: 40.0000 mg | DELAYED_RELEASE_CAPSULE | Freq: Every day | ORAL | Status: DC

## 2013-12-23 NOTE — Patient Instructions (Addendum)
F/u in 6 month, call if youn need me before  All the best for 2016  I believe that surgery to help with weight loss will benefit you greatly

## 2013-12-24 ENCOUNTER — Other Ambulatory Visit (INDEPENDENT_AMBULATORY_CARE_PROVIDER_SITE_OTHER): Payer: Self-pay

## 2013-12-24 LAB — COMPREHENSIVE METABOLIC PANEL
ALBUMIN: 4.5 g/dL (ref 3.5–5.2)
ALK PHOS: 83 U/L (ref 39–117)
ALT: 26 U/L (ref 0–35)
AST: 23 U/L (ref 0–37)
BUN: 14 mg/dL (ref 6–23)
CALCIUM: 9.9 mg/dL (ref 8.4–10.5)
CO2: 31 mEq/L (ref 19–32)
CREATININE: 0.79 mg/dL (ref 0.50–1.10)
Chloride: 101 mEq/L (ref 96–112)
Glucose, Bld: 80 mg/dL (ref 70–99)
POTASSIUM: 3.9 meq/L (ref 3.5–5.3)
Sodium: 140 mEq/L (ref 135–145)
Total Bilirubin: 0.5 mg/dL (ref 0.2–1.2)
Total Protein: 7.9 g/dL (ref 6.0–8.3)

## 2013-12-24 LAB — LIPID PANEL
CHOL/HDL RATIO: 3.8 ratio
CHOLESTEROL: 202 mg/dL — AB (ref 0–200)
HDL: 53 mg/dL (ref >39)
LDL Cholesterol: 128 mg/dL — ABNORMAL HIGH (ref 0–99)
TRIGLYCERIDES: 105 mg/dL (ref <150)
VLDL: 21 mg/dL (ref 0–40)

## 2013-12-24 LAB — HEMOGLOBIN A1C
Hgb A1c MFr Bld: 5.8 % — ABNORMAL HIGH (ref <5.7)
MEAN PLASMA GLUCOSE: 120 mg/dL — AB (ref <117)

## 2013-12-25 ENCOUNTER — Ambulatory Visit (HOSPITAL_COMMUNITY)
Admission: RE | Admit: 2013-12-25 | Discharge: 2013-12-25 | Disposition: A | Discharge status: Home / Self Care | Payer: BC Managed Care – PPO | Source: Ambulatory Visit | Attending: Family Medicine | Admitting: Family Medicine

## 2013-12-25 DIAGNOSIS — Z1231 Encounter for screening mammogram for malignant neoplasm of breast: Secondary | ICD-10-CM | POA: Diagnosis present

## 2013-12-30 NOTE — Assessment & Plan Note (Signed)
Controlled, no change in medication DASH diet and commitment to daily physical activity for a minimum of 30 minutes discussed and encouraged, as a part of hypertension management. The importance of attaining a healthy weight is also discussed.  

## 2013-12-30 NOTE — Assessment & Plan Note (Signed)
Uncontrolled, requires nexium script which is sent in

## 2013-12-30 NOTE — Assessment & Plan Note (Signed)
The increased risk of cardiovascular disease associated with this diagnosis, and the need to consistently work on lifestyle to change this is discussed. Following  a  heart healthy diet ,commitment to 30 minutes of exercise at least 5 days per week, as well as control of blood sugar and cholesterol , and achieving a healthy weight are all the areas to be addressed .  

## 2013-12-30 NOTE — Progress Notes (Signed)
Subjective:    Patient ID: Stephanie Cannon, female    DOB: April 09, 1970, 43 y.o.   MRN: [id number]  HPI The PT is here for follow up and re-evaluation of chronic medical conditions, medication management and review of any available recent lab and radiology data.  Preventive health is updated, specifically  Cancer screening and Immunization.   Questions or concerns regarding consultations or procedures which the PT has had in the interim are  Addressed.Has seen both a spine surgeon and bariatric surgeon, intends to get bariatric surgery first, as she continues to gain weight  The PT denies any adverse reactions to current medications since the last visit.  C/o increased and uncontrolled GERD symptoms       Review of Systems See HPI Denies recent fever or chills. Denies sinus pressure, nasal congestion, ear pain or sore throat. Denies chest congestion, productive cough or wheezing. Denies chest pains, palpitations and leg swelling Denies  vomiting, or constipation.  C/o increased and uncontrolled GERd symptoms and she has chronic diarheah  From IBS Denies dysuria, frequency, hesitancy or incontinence. Denies skin break down or rash.        Objective:   Physical Exam BP 134/84 mmHg  Pulse 100  Resp 18  Wt 298 lb 1.9 oz (135.226 kg)  SpO2 98% Patient alert and oriented and in no cardiopulmonary distress.  HEENT: No facial asymmetry, EOMI,   oropharynx pink and moist.  Neck supple no JVD, no mass.  Chest: Clear to auscultation bilaterally.  CVS: S1, S2 no murmurs, no S3.Regular rate.  ABD: Soft non tender.   Ext: No edema  MS: decreased  ROM lumbar spine, adequate in shoulders, hips and knees.  Skin: Intact, no ulcerations or rash noted.  Psych: Good eye contact, normal affect. Memory intact not anxious or depressed appearing.  CNS: CN 2-12 intact, power,  normal throughout.no focal deficits noted.        Assessment & Plan:  Essential hypertension Controlled,  no change in medication DASH diet and commitment to daily physical activity for a minimum of 30 minutes discussed and encouraged, as a part of hypertension management. The importance of attaining a healthy weight is also discussed.   Prediabetes Improved slightly Patient educated about the importance of limiting  Carbohydrate intake , the need to commit to daily physical activity for a minimum of 30 minutes , and to commit weight loss. The fact that changes in all these areas will reduce or eliminate all together the development of diabetes is stressed.   Updated lab needed at/ before next visit.   Hyperlipidemia LDL goal <100 Deteriorated Hyperlipidemia:Low fat diet discussed and encouraged.  Updated lab needed at/ before next visit.   Metabolic syndrome X The increased risk of cardiovascular disease associated with this diagnosis, and the need to consistently work on lifestyle to change this is discussed. Following  a  heart healthy diet ,commitment to 30 minutes of exercise at least 5 days per week, as well as control of blood sugar and cholesterol , and achieving a healthy weight are all the areas to be addressed .   Morbid obesity Deteriorated. Patient re-educated about  the importance of commitment to a  minimum of 150 minutes of exercise per week. The importance of healthy food choices with portion control discussed. Encouraged to start a food diary, count calories and to consider  joining a support group. Sample diet sheets offered. Goals set by the patient for the next several months.  Lumbar pain with radiation down left leg Progressively worsening and increasingly debilitating Anticipate back surgery after bariatric surgery  GERD Uncontrolled, requires nexium script which is sent in

## 2013-12-30 NOTE — Assessment & Plan Note (Signed)
Improved slightly Patient educated about the importance of limiting  Carbohydrate intake , the need to commit to daily physical activity for a minimum of 30 minutes , and to commit weight loss. The fact that changes in all these areas will reduce or eliminate all together the development of diabetes is stressed.   Updated lab needed at/ before next visit.

## 2013-12-30 NOTE — Assessment & Plan Note (Signed)
Deteriorated Hyperlipidemia:Low fat diet discussed and encouraged.  Updated lab needed at/ before next visit.

## 2013-12-30 NOTE — Assessment & Plan Note (Signed)
Progressively worsening and increasingly debilitating Anticipate back surgery after bariatric surgery

## 2013-12-30 NOTE — Assessment & Plan Note (Signed)
Deteriorated. Patient re-educated about  the importance of commitment to a  minimum of 150 minutes of exercise per week. The importance of healthy food choices with portion control discussed. Encouraged to start a food diary, count calories and to consider  joining a support group. Sample diet sheets offered. Goals set by the patient for the next several months.    

## 2014-01-04 ENCOUNTER — Other Ambulatory Visit: Payer: Self-pay | Admitting: Family Medicine

## 2014-01-07 ENCOUNTER — Telehealth: Payer: Self-pay | Admitting: Family Medicine

## 2014-01-07 NOTE — Telephone Encounter (Signed)
Tried to call but patient wasn't home. Left message that I was returning her call with husband and that I had mailed a copy of her results to her and to call back with any questions

## 2014-01-09 ENCOUNTER — Other Ambulatory Visit (HOSPITAL_COMMUNITY): Payer: Self-pay | Admitting: Neurosurgery

## 2014-01-11 ENCOUNTER — Ambulatory Visit: Payer: BC Managed Care – PPO | Admitting: Dietician

## 2014-01-16 ENCOUNTER — Other Ambulatory Visit: Payer: Self-pay

## 2014-01-16 ENCOUNTER — Encounter (HOSPITAL_COMMUNITY): Payer: Self-pay

## 2014-01-16 ENCOUNTER — Encounter (HOSPITAL_COMMUNITY)
Admission: RE | Admit: 2014-01-16 | Discharge: 2014-01-16 | Disposition: A | Discharge status: Home / Self Care | Payer: BC Managed Care – PPO | Source: Ambulatory Visit | Attending: Neurosurgery | Admitting: Neurosurgery

## 2014-01-16 DIAGNOSIS — Z01812 Encounter for preprocedural laboratory examination: Secondary | ICD-10-CM | POA: Insufficient documentation

## 2014-01-16 DIAGNOSIS — M4316 Spondylolisthesis, lumbar region: Secondary | ICD-10-CM | POA: Insufficient documentation

## 2014-01-16 DIAGNOSIS — Z01818 Encounter for other preprocedural examination: Secondary | ICD-10-CM | POA: Insufficient documentation

## 2014-01-16 DIAGNOSIS — Z01810 Encounter for preprocedural cardiovascular examination: Secondary | ICD-10-CM | POA: Insufficient documentation

## 2014-01-16 LAB — CBC
HEMATOCRIT: 46.6 % — AB (ref 36.0–46.0)
Hemoglobin: 15.6 g/dL — ABNORMAL HIGH (ref 12.0–15.0)
MCH: 29.3 pg (ref 26.0–34.0)
MCHC: 33.5 g/dL (ref 30.0–36.0)
MCV: 87.6 fL (ref 78.0–100.0)
Platelets: 218 10*3/uL (ref 150–400)
RBC: 5.32 MIL/uL — AB (ref 3.87–5.11)
RDW: 13.9 % (ref 11.5–15.5)
WBC: 11 10*3/uL — ABNORMAL HIGH (ref 4.0–10.5)

## 2014-01-16 LAB — ABO/RH: ABO/RH(D): O POS

## 2014-01-16 LAB — BASIC METABOLIC PANEL
Anion gap: 8 (ref 5–15)
BUN: 16 mg/dL (ref 6–23)
CHLORIDE: 102 meq/L (ref 96–112)
CO2: 28 mmol/L (ref 19–32)
Calcium: 9.5 mg/dL (ref 8.4–10.5)
Creatinine, Ser: 0.8 mg/dL (ref 0.50–1.10)
GFR calc non Af Amer: 88 mL/min — ABNORMAL LOW (ref >90)
Glucose, Bld: 92 mg/dL (ref 70–99)
Potassium: 3.5 mmol/L (ref 3.5–5.1)
SODIUM: 138 mmol/L (ref 135–145)

## 2014-01-16 LAB — SURGICAL PCR SCREEN
MRSA, PCR: NEGATIVE
Staphylococcus aureus: NEGATIVE

## 2014-01-17 LAB — TYPE AND SCREEN
ABO/RH(D): O POS
Antibody Screen: NEGATIVE

## 2014-01-21 MED ORDER — DEXTROSE 5 % IV SOLN
3.0000 g | INTRAVENOUS | Status: AC
  Administered 2014-01-22: 3 g via INTRAVENOUS
  Filled 2014-01-21 (×2): qty 3000

## 2014-01-22 ENCOUNTER — Encounter (HOSPITAL_COMMUNITY): Payer: Self-pay | Admitting: Certified Registered Nurse Anesthetist

## 2014-01-22 ENCOUNTER — Inpatient Hospital Stay (HOSPITAL_COMMUNITY): Payer: BC Managed Care – PPO

## 2014-01-22 ENCOUNTER — Inpatient Hospital Stay (HOSPITAL_COMMUNITY): Payer: BC Managed Care – PPO | Admitting: Certified Registered"

## 2014-01-22 ENCOUNTER — Inpatient Hospital Stay (HOSPITAL_COMMUNITY)
Admission: RE | Admit: 2014-01-22 | Discharge: 2014-01-24 | DRG: 460 | Disposition: A | Discharge status: Home Health | Payer: BC Managed Care – PPO | Source: Ambulatory Visit | Attending: Neurosurgery | Admitting: Neurosurgery

## 2014-01-22 ENCOUNTER — Encounter (HOSPITAL_COMMUNITY)
Admission: RE | Disposition: A | Discharge status: Home Health | Payer: Self-pay | Source: Ambulatory Visit | Attending: Neurosurgery

## 2014-01-22 DIAGNOSIS — M4316 Spondylolisthesis, lumbar region: Secondary | ICD-10-CM | POA: Diagnosis present

## 2014-01-22 DIAGNOSIS — Z791 Long term (current) use of non-steroidal anti-inflammatories (NSAID): Secondary | ICD-10-CM | POA: Diagnosis not present

## 2014-01-22 DIAGNOSIS — K219 Gastro-esophageal reflux disease without esophagitis: Secondary | ICD-10-CM | POA: Diagnosis present

## 2014-01-22 DIAGNOSIS — E785 Hyperlipidemia, unspecified: Secondary | ICD-10-CM | POA: Diagnosis present

## 2014-01-22 DIAGNOSIS — T402X5A Adverse effect of other opioids, initial encounter: Secondary | ICD-10-CM | POA: Diagnosis not present

## 2014-01-22 DIAGNOSIS — Z87891 Personal history of nicotine dependence: Secondary | ICD-10-CM

## 2014-01-22 DIAGNOSIS — M1990 Unspecified osteoarthritis, unspecified site: Secondary | ICD-10-CM | POA: Diagnosis present

## 2014-01-22 DIAGNOSIS — M4806 Spinal stenosis, lumbar region: Secondary | ICD-10-CM | POA: Diagnosis present

## 2014-01-22 DIAGNOSIS — M5116 Intervertebral disc disorders with radiculopathy, lumbar region: Secondary | ICD-10-CM | POA: Diagnosis present

## 2014-01-22 DIAGNOSIS — Z419 Encounter for procedure for purposes other than remedying health state, unspecified: Secondary | ICD-10-CM

## 2014-01-22 DIAGNOSIS — L299 Pruritus, unspecified: Secondary | ICD-10-CM | POA: Diagnosis not present

## 2014-01-22 DIAGNOSIS — I10 Essential (primary) hypertension: Secondary | ICD-10-CM | POA: Diagnosis present

## 2014-01-22 DIAGNOSIS — G8929 Other chronic pain: Secondary | ICD-10-CM | POA: Diagnosis present

## 2014-01-22 DIAGNOSIS — M549 Dorsalgia, unspecified: Secondary | ICD-10-CM | POA: Diagnosis present

## 2014-01-22 HISTORY — PX: LUMBAR FUSION: SHX111

## 2014-01-22 SURGERY — POSTERIOR LUMBAR FUSION 1 LEVEL
Anesthesia: General | Site: Back

## 2014-01-22 MED ORDER — LACTATED RINGERS IV SOLN
INTRAVENOUS | Status: DC
  Administered 2014-01-22: 75 mL/h via INTRAVENOUS

## 2014-01-22 MED ORDER — LACTATED RINGERS IV SOLN
INTRAVENOUS | Status: DC
  Administered 2014-01-22: 10:00:00 via INTRAVENOUS

## 2014-01-22 MED ORDER — PHENOL 1.4 % MT LIQD: 1.0000 | OROMUCOSAL | Status: DC | PRN

## 2014-01-22 MED ORDER — FENTANYL CITRATE 0.05 MG/ML IJ SOLN
INTRAMUSCULAR | Status: DC | PRN
  Administered 2014-01-22: 50 ug via INTRAVENOUS
  Administered 2014-01-22: 100 ug via INTRAVENOUS
  Administered 2014-01-22 (×2): 50 ug via INTRAVENOUS
  Administered 2014-01-22: 100 ug via INTRAVENOUS
  Administered 2014-01-22 (×3): 50 ug via INTRAVENOUS

## 2014-01-22 MED ORDER — MIDAZOLAM HCL 2 MG/2ML IJ SOLN
INTRAMUSCULAR | Status: AC
  Filled 2014-01-22: qty 2

## 2014-01-22 MED ORDER — LACTATED RINGERS IV SOLN
INTRAVENOUS | Status: DC | PRN
  Administered 2014-01-22 (×3): via INTRAVENOUS

## 2014-01-22 MED ORDER — ONDANSETRON HCL 4 MG/2ML IJ SOLN
INTRAMUSCULAR | Status: AC
  Filled 2014-01-22: qty 2

## 2014-01-22 MED ORDER — OXYCODONE-ACETAMINOPHEN 5-325 MG PO TABS
1.0000 | ORAL_TABLET | ORAL | Status: DC | PRN
  Administered 2014-01-22 – 2014-01-24 (×4): 2 via ORAL
  Filled 2014-01-22 (×5): qty 2

## 2014-01-22 MED ORDER — DIAZEPAM 5 MG PO TABS
5.0000 mg | ORAL_TABLET | Freq: Four times a day (QID) | ORAL | Status: DC | PRN
  Administered 2014-01-22 – 2014-01-23 (×3): 5 mg via ORAL
  Filled 2014-01-22 (×2): qty 1

## 2014-01-22 MED ORDER — KETOROLAC TROMETHAMINE 30 MG/ML IJ SOLN
30.0000 mg | Freq: Once | INTRAMUSCULAR | Status: AC | PRN
  Administered 2014-01-22: 30 mg via INTRAVENOUS

## 2014-01-22 MED ORDER — PROPOFOL 10 MG/ML IV BOLUS
INTRAVENOUS | Status: DC | PRN
  Administered 2014-01-22: 50 mg via INTRAVENOUS
  Administered 2014-01-22: 150 mg via INTRAVENOUS

## 2014-01-22 MED ORDER — CEFAZOLIN SODIUM-DEXTROSE 2-3 GM-% IV SOLR
2.0000 g | Freq: Three times a day (TID) | INTRAVENOUS | Status: AC
  Administered 2014-01-22 – 2014-01-23 (×2): 2 g via INTRAVENOUS
  Filled 2014-01-22 (×2): qty 50

## 2014-01-22 MED ORDER — GLYCOPYRROLATE 0.2 MG/ML IJ SOLN
INTRAMUSCULAR | Status: DC | PRN
  Administered 2014-01-22: .8 mg via INTRAVENOUS

## 2014-01-22 MED ORDER — KETAMINE HCL 100 MG/ML IJ SOLN
INTRAMUSCULAR | Status: AC
  Filled 2014-01-22: qty 1

## 2014-01-22 MED ORDER — SODIUM CHLORIDE 0.9 % IR SOLN
Status: DC | PRN
  Administered 2014-01-22: 500 mL

## 2014-01-22 MED ORDER — MENTHOL 3 MG MT LOZG: 1.0000 | LOZENGE | OROMUCOSAL | Status: DC | PRN

## 2014-01-22 MED ORDER — HYDROCODONE-ACETAMINOPHEN 5-325 MG PO TABS
1.0000 | ORAL_TABLET | ORAL | Status: DC | PRN
  Administered 2014-01-24: 2 via ORAL
  Filled 2014-01-22: qty 2

## 2014-01-22 MED ORDER — HYDROMORPHONE HCL 1 MG/ML IJ SOLN
0.2500 mg | INTRAMUSCULAR | Status: DC | PRN
  Administered 2014-01-22 (×4): 0.5 mg via INTRAVENOUS

## 2014-01-22 MED ORDER — ONDANSETRON HCL 4 MG/2ML IJ SOLN
INTRAMUSCULAR | Status: DC | PRN
  Administered 2014-01-22: 4 mg via INTRAVENOUS

## 2014-01-22 MED ORDER — ONDANSETRON HCL 4 MG/2ML IJ SOLN: 4.0000 mg | INTRAMUSCULAR | Status: DC | PRN

## 2014-01-22 MED ORDER — NEOSTIGMINE METHYLSULFATE 10 MG/10ML IV SOLN
INTRAVENOUS | Status: DC | PRN
  Administered 2014-01-22: 5 mg via INTRAVENOUS

## 2014-01-22 MED ORDER — LIDOCAINE HCL (CARDIAC) 20 MG/ML IV SOLN
INTRAVENOUS | Status: AC
  Filled 2014-01-22: qty 5

## 2014-01-22 MED ORDER — DOCUSATE SODIUM 100 MG PO CAPS
100.0000 mg | ORAL_CAPSULE | Freq: Two times a day (BID) | ORAL | Status: DC
  Administered 2014-01-22 – 2014-01-24 (×4): 100 mg via ORAL
  Filled 2014-01-22 (×4): qty 1

## 2014-01-22 MED ORDER — PHENYLEPHRINE HCL 10 MG/ML IJ SOLN
INTRAMUSCULAR | Status: DC | PRN
  Administered 2014-01-22: 80 ug via INTRAVENOUS
  Administered 2014-01-22: 120 ug via INTRAVENOUS
  Administered 2014-01-22 (×3): 80 ug via INTRAVENOUS
  Administered 2014-01-22 (×2): 40 ug via INTRAVENOUS
  Administered 2014-01-22: 80 ug via INTRAVENOUS
  Administered 2014-01-22: 40 ug via INTRAVENOUS

## 2014-01-22 MED ORDER — HYDROMORPHONE HCL 1 MG/ML IJ SOLN
1.0000 mg | INTRAMUSCULAR | Status: DC | PRN
  Administered 2014-01-22 – 2014-01-24 (×8): 1 mg via INTRAVENOUS
  Filled 2014-01-22 (×8): qty 1

## 2014-01-22 MED ORDER — THROMBIN 20000 UNITS EX SOLR
CUTANEOUS | Status: DC | PRN
  Administered 2014-01-22: 20 mL via TOPICAL

## 2014-01-22 MED ORDER — ACETAMINOPHEN 650 MG RE SUPP: 650.0000 mg | RECTAL | Status: DC | PRN

## 2014-01-22 MED ORDER — TRIAMTERENE-HCTZ 37.5-25 MG PO TABS
1.0000 | ORAL_TABLET | Freq: Every day | ORAL | Status: DC
  Administered 2014-01-22 – 2014-01-24 (×3): 1 via ORAL
  Filled 2014-01-22 (×3): qty 1

## 2014-01-22 MED ORDER — BUPIVACAINE LIPOSOME 1.3 % IJ SUSP
20.0000 mL | Freq: Once | INTRAMUSCULAR | Status: DC
  Filled 2014-01-22: qty 20

## 2014-01-22 MED ORDER — PROMETHAZINE HCL 25 MG/ML IJ SOLN: 6.2500 mg | INTRAMUSCULAR | Status: DC | PRN

## 2014-01-22 MED ORDER — ACETAMINOPHEN 325 MG PO TABS
650.0000 mg | ORAL_TABLET | ORAL | Status: DC | PRN
  Administered 2014-01-23: 650 mg via ORAL
  Filled 2014-01-22: qty 2

## 2014-01-22 MED ORDER — ALBUMIN HUMAN 5 % IV SOLN
INTRAVENOUS | Status: DC | PRN
  Administered 2014-01-22 (×2): via INTRAVENOUS

## 2014-01-22 MED ORDER — BACITRACIN ZINC 500 UNIT/GM EX OINT
TOPICAL_OINTMENT | CUTANEOUS | Status: DC | PRN
  Administered 2014-01-22: 1 via TOPICAL

## 2014-01-22 MED ORDER — EPHEDRINE SULFATE 50 MG/ML IJ SOLN
INTRAMUSCULAR | Status: AC
  Filled 2014-01-22: qty 2

## 2014-01-22 MED ORDER — KETAMINE HCL 10 MG/ML IJ SOLN
INTRAMUSCULAR | Status: DC | PRN
  Administered 2014-01-22: 60 mg via INTRAVENOUS

## 2014-01-22 MED ORDER — ROCURONIUM BROMIDE 50 MG/5ML IV SOLN
INTRAVENOUS | Status: AC
  Filled 2014-01-22: qty 1

## 2014-01-22 MED ORDER — BUPIVACAINE-EPINEPHRINE (PF) 0.5% -1:200000 IJ SOLN
INTRAMUSCULAR | Status: DC | PRN
  Administered 2014-01-22: 10 mL via PERINEURAL

## 2014-01-22 MED ORDER — PHENYLEPHRINE HCL 10 MG/ML IJ SOLN
10.0000 mg | INTRAVENOUS | Status: DC | PRN
  Administered 2014-01-22: 30 ug/min via INTRAVENOUS

## 2014-01-22 MED ORDER — 0.9 % SODIUM CHLORIDE (POUR BTL) OPTIME
TOPICAL | Status: DC | PRN
  Administered 2014-01-22: 1000 mL

## 2014-01-22 MED ORDER — ALPRAZOLAM 0.5 MG PO TABS
1.0000 mg | ORAL_TABLET | Freq: Every evening | ORAL | Status: DC | PRN
  Administered 2014-01-22: 1 mg via ORAL
  Filled 2014-01-22: qty 2

## 2014-01-22 MED ORDER — MIDAZOLAM HCL 5 MG/5ML IJ SOLN
INTRAMUSCULAR | Status: DC | PRN
  Administered 2014-01-22: 2 mg via INTRAVENOUS

## 2014-01-22 MED ORDER — KETOROLAC TROMETHAMINE 30 MG/ML IJ SOLN
INTRAMUSCULAR | Status: AC
  Administered 2014-01-22: 30 mg via INTRAVENOUS
  Filled 2014-01-22: qty 1

## 2014-01-22 MED ORDER — LIDOCAINE HCL (CARDIAC) 20 MG/ML IV SOLN
INTRAVENOUS | Status: DC | PRN
  Administered 2014-01-22: 60 mg via INTRAVENOUS

## 2014-01-22 MED ORDER — PANTOPRAZOLE SODIUM 40 MG PO TBEC
80.0000 mg | DELAYED_RELEASE_TABLET | Freq: Every day | ORAL | Status: DC
  Administered 2014-01-23: 80 mg via ORAL
  Filled 2014-01-22: qty 2

## 2014-01-22 MED ORDER — EPHEDRINE SULFATE 50 MG/ML IJ SOLN
INTRAMUSCULAR | Status: DC | PRN
  Administered 2014-01-22: 10 mg via INTRAVENOUS

## 2014-01-22 MED ORDER — FENTANYL CITRATE 0.05 MG/ML IJ SOLN
INTRAMUSCULAR | Status: AC
  Filled 2014-01-22: qty 5

## 2014-01-22 MED ORDER — PROPOFOL 10 MG/ML IV BOLUS
INTRAVENOUS | Status: AC
  Filled 2014-01-22: qty 20

## 2014-01-22 MED ORDER — SODIUM CHLORIDE 0.9 % IJ SOLN
INTRAMUSCULAR | Status: AC
  Filled 2014-01-22: qty 20

## 2014-01-22 MED ORDER — ROCURONIUM BROMIDE 100 MG/10ML IV SOLN
INTRAVENOUS | Status: DC | PRN
  Administered 2014-01-22: 10 mg via INTRAVENOUS
  Administered 2014-01-22: 50 mg via INTRAVENOUS
  Administered 2014-01-22 (×2): 10 mg via INTRAVENOUS

## 2014-01-22 MED ORDER — DIAZEPAM 5 MG PO TABS
ORAL_TABLET | ORAL | Status: AC
  Administered 2014-01-22: 5 mg via ORAL
  Filled 2014-01-22: qty 1

## 2014-01-22 MED ORDER — SUCCINYLCHOLINE CHLORIDE 20 MG/ML IJ SOLN
INTRAMUSCULAR | Status: DC | PRN
  Administered 2014-01-22: 120 mg via INTRAVENOUS

## 2014-01-22 MED ORDER — HYDROMORPHONE HCL 1 MG/ML IJ SOLN
INTRAMUSCULAR | Status: AC
  Administered 2014-01-22: 0.5 mg via INTRAVENOUS
  Filled 2014-01-22: qty 2

## 2014-01-22 MED ORDER — BUPIVACAINE LIPOSOME 1.3 % IJ SUSP
INTRAMUSCULAR | Status: DC | PRN
  Administered 2014-01-22: 20 mL

## 2014-01-22 MED ORDER — ALUM & MAG HYDROXIDE-SIMETH 200-200-20 MG/5ML PO SUSP: 30.0000 mL | Freq: Four times a day (QID) | ORAL | Status: DC | PRN

## 2014-01-22 SURGICAL SUPPLY — 70 items
APL SKNCLS STERI-STRIP NONHPOA (GAUZE/BANDAGES/DRESSINGS) ×1
BAG DECANTER FOR FLEXI CONT (MISCELLANEOUS) ×2 IMPLANT
BENZOIN TINCTURE PRP APPL 2/3 (GAUZE/BANDAGES/DRESSINGS) ×2 IMPLANT
BLADE CLIPPER SURG (BLADE) IMPLANT
BRUSH SCRUB EZ PLAIN DRY (MISCELLANEOUS) ×2 IMPLANT
BUR ACORN 6.0 PRECISION (BURR) ×1 IMPLANT
BUR MATCHSTICK NEURO 3.0 LAGG (BURR) ×2 IMPLANT
BUR PRECISION FLUTE 6.0 (BURR) ×2 IMPLANT
CANISTER SUCT 3000ML (MISCELLANEOUS) ×2 IMPLANT
CAP REVERE LOCKING (Cap) ×4 IMPLANT
CONT SPEC 4OZ CLIKSEAL STRL BL (MISCELLANEOUS) ×2 IMPLANT
COVER BACK TABLE 60X90IN (DRAPES) ×2 IMPLANT
DRAPE C-ARM 42X72 X-RAY (DRAPES) ×4 IMPLANT
DRAPE LAPAROTOMY 100X72X124 (DRAPES) ×2 IMPLANT
DRAPE POUCH INSTRU U-SHP 10X18 (DRAPES) ×2 IMPLANT
DRAPE PROXIMA HALF (DRAPES) ×2 IMPLANT
DRAPE SURG 17X23 STRL (DRAPES) ×8 IMPLANT
ELECT BLADE 4.0 EZ CLEAN MEGAD (MISCELLANEOUS) ×2
ELECT REM PT RETURN 9FT ADLT (ELECTROSURGICAL) ×2
ELECTRODE BLDE 4.0 EZ CLN MEGD (MISCELLANEOUS) ×1 IMPLANT
ELECTRODE REM PT RTRN 9FT ADLT (ELECTROSURGICAL) ×1 IMPLANT
EVACUATOR 1/8 PVC DRAIN (DRAIN) IMPLANT
GAUZE SPONGE 4X4 12PLY STRL (GAUZE/BANDAGES/DRESSINGS) ×2 IMPLANT
GAUZE SPONGE 4X4 16PLY XRAY LF (GAUZE/BANDAGES/DRESSINGS) ×2 IMPLANT
GLOVE BIO SURGEON STRL SZ8.5 (GLOVE) ×4 IMPLANT
GLOVE BIOGEL PI IND STRL 7.0 (GLOVE) IMPLANT
GLOVE BIOGEL PI IND STRL 7.5 (GLOVE) IMPLANT
GLOVE BIOGEL PI INDICATOR 7.0 (GLOVE) ×1
GLOVE BIOGEL PI INDICATOR 7.5 (GLOVE) ×2
GLOVE ECLIPSE 7.0 STRL STRAW (GLOVE) ×1 IMPLANT
GLOVE EXAM NITRILE LRG STRL (GLOVE) IMPLANT
GLOVE EXAM NITRILE MD LF STRL (GLOVE) IMPLANT
GLOVE EXAM NITRILE XL STR (GLOVE) IMPLANT
GLOVE EXAM NITRILE XS STR PU (GLOVE) IMPLANT
GLOVE SS BIOGEL STRL SZ 8 (GLOVE) ×2 IMPLANT
GLOVE SS N UNI LF 7.0 STRL (GLOVE) ×3 IMPLANT
GLOVE SUPERSENSE BIOGEL SZ 8 (GLOVE) ×2
GOWN STRL REUS W/ TWL LRG LVL3 (GOWN DISPOSABLE) IMPLANT
GOWN STRL REUS W/ TWL XL LVL3 (GOWN DISPOSABLE) ×2 IMPLANT
GOWN STRL REUS W/TWL 2XL LVL3 (GOWN DISPOSABLE) IMPLANT
GOWN STRL REUS W/TWL LRG LVL3 (GOWN DISPOSABLE) ×4
GOWN STRL REUS W/TWL XL LVL3 (GOWN DISPOSABLE) ×8
KIT BASIN OR (CUSTOM PROCEDURE TRAY) ×2 IMPLANT
KIT ROOM TURNOVER OR (KITS) ×2 IMPLANT
NDL HYPO 21X1.5 SAFETY (NEEDLE) IMPLANT
NEEDLE HYPO 21X1.5 SAFETY (NEEDLE) IMPLANT
NEEDLE HYPO 22GX1.5 SAFETY (NEEDLE) ×2 IMPLANT
NS IRRIG 1000ML POUR BTL (IV SOLUTION) ×2 IMPLANT
PACK LAMINECTOMY NEURO (CUSTOM PROCEDURE TRAY) ×2 IMPLANT
PAD ARMBOARD 7.5X6 YLW CONV (MISCELLANEOUS) ×6 IMPLANT
PATTIES SURGICAL .5 X1 (DISPOSABLE) IMPLANT
ROD REVERE 6.35 40MM (Rod) ×2 IMPLANT
SCREW 7.5X45MM (Screw) ×2 IMPLANT
SCREW REVERE 6.35 6.5MMX45 (Screw) ×2 IMPLANT
SPACER ALTERA 10X31 8-12MM-8 (Spacer) ×1 IMPLANT
SPONGE LAP 4X18 X RAY DECT (DISPOSABLE) IMPLANT
SPONGE NEURO XRAY DETECT 1X3 (DISPOSABLE) IMPLANT
SPONGE SURGIFOAM ABS GEL 100 (HEMOSTASIS) ×2 IMPLANT
STRIP BIOACTIVE 20CC 25X100X8 (Miscellaneous) ×1 IMPLANT
STRIP CLOSURE SKIN 1/2X4 (GAUZE/BANDAGES/DRESSINGS) ×2 IMPLANT
SUT VIC AB 1 CT1 18XBRD ANBCTR (SUTURE) ×2 IMPLANT
SUT VIC AB 1 CT1 8-18 (SUTURE) ×4
SUT VIC AB 2-0 CP2 18 (SUTURE) ×4 IMPLANT
SYR 20CC LL (SYRINGE) IMPLANT
SYR 20ML ECCENTRIC (SYRINGE) ×2 IMPLANT
TAPE CLOTH SURG 4X10 WHT LF (GAUZE/BANDAGES/DRESSINGS) ×1 IMPLANT
TOWEL OR 17X24 6PK STRL BLUE (TOWEL DISPOSABLE) ×2 IMPLANT
TOWEL OR 17X26 10 PK STRL BLUE (TOWEL DISPOSABLE) ×2 IMPLANT
TRAY FOLEY CATH 14FRSI W/METER (CATHETERS) ×2 IMPLANT
WATER STERILE IRR 1000ML POUR (IV SOLUTION) ×2 IMPLANT

## 2014-01-22 NOTE — Progress Notes (Signed)
Patient ID: Stephanie Cannon, female   DOB: 1970/02/09, 44 y.o.   MRN: [id number] Subjective:  The patient is somnolent but easily arousable. She is in no apparent distress. She looks well.  Objective: Vital signs in last 24 hours: Temp:  [98 F (36.7 C)] 98 F (36.7 C) (01/20 0910) Pulse Rate:  [95] 95 (01/20 0910) Resp:  [18] 18 (01/20 0910) BP: (165)/(96) 165/96 mmHg (01/20 0910) SpO2:  [100 %] 100 % (01/20 0910) Weight:  [136.079 kg (300 lb)] 136.079 kg (300 lb) (01/20 0910)  Intake/Output from previous day:   Intake/Output this shift: Total I/O In: 3300 [I.V.:2800; IV Piggyback:500] Out: 325 [Urine:125; Blood:200]  Physical exam the patient is somewhat but arousable. She is moving her lower extremities well.  Lab Results: No results for input(s): WBC, HGB, HCT, PLT in the last 72 hours. BMET No results for input(s): NA, K, CL, CO2, GLUCOSE, BUN, CREATININE, CALCIUM in the last 72 hours.  Studies/Results: Dg Lumbar Spine 2-3 Views  01/22/2014   CLINICAL DATA:  PLIF L4-5 intraoperative localization  EXAM: LUMBAR SPINE - 2-3 VIEW; DG C-ARM 61-120 MIN  TECHNIQUE: Four spot views  FLUOROSCOPY TIME:  0 min 21 seconds  COMPARISON:  07/26/2013  FINDINGS: On image 1 there is a surgical device projecting over the L4-5 disc space. This is also seen on the second image. On the third image there is fusion device in the L4-5 disc space. This is also seen on the frontal view, image 4. Image 3 and image 4 demonstrate transpedicular screws at the L4-L5 level bilaterally.  IMPRESSION: Postsurgical change with intraoperative localization as described.   Electronically Signed   By: Skipper Cliche M.D.   On: 01/22/2014 16:31   Dg Lumbar Spine 1 View  01/22/2014   CLINICAL DATA:  44 year old female with back pain surgery for L4-L5 posterior lumbar interbody fusion.  EXAM: LUMBAR SPINE - 1 VIEW  COMPARISON:  08/30/2013  FINDINGS: Single lateral intraoperative spot film during posterior lumbar interbody  fusion.  Self retaining retractor overlies the L4 level, with a surgical curette identifying the level of the L4 pedicle.  Surgical clips within the abdomen.  Surgical packing overlying L4-L5.  Degenerative disc disease at L5-S1 is most pronounced.  IMPRESSION: Intraoperative cross-table lateral during posterior lumbar interbody fusion demonstrates surgical curette identifying the pedicle of L4.  Signed,  Dulcy Fanny. Earleen Newport, DO  [facility]  [facility]   Electronically Signed   By: Corrie Mckusick D.O.   On: 01/22/2014 16:02   Dg C-arm 1-60 Min  01/22/2014   CLINICAL DATA:  PLIF L4-5 intraoperative localization  EXAM: LUMBAR SPINE - 2-3 VIEW; DG C-ARM 61-120 MIN  TECHNIQUE: Four spot views  FLUOROSCOPY TIME:  0 min 21 seconds  COMPARISON:  07/26/2013  FINDINGS: On image 1 there is a surgical device projecting over the L4-5 disc space. This is also seen on the second image. On the third image there is fusion device in the L4-5 disc space. This is also seen on the frontal view, image 4. Image 3 and image 4 demonstrate transpedicular screws at the L4-L5 level bilaterally.  IMPRESSION: Postsurgical change with intraoperative localization as described.   Electronically Signed   By: Skipper Cliche M.D.   On: 01/22/2014 16:31    Assessment/Plan: The patient is doing well. I spoke with her family.  LOS: 0 days     Man Bonneau D 01/22/2014, 4:58 PM

## 2014-01-22 NOTE — H&P (Signed)
Subjective: The patient is a 44 year old black female who has had chronic back and leg pain. She has failed medical management and was worked up with a lumbar MRI and lumbar x-rays which demonstrated a L4-5 spondylolisthesis, facet arthropathy, etc. I discussed the various treatment options with the patient. She has decided to proceed with surgery.   Past Medical History  Diagnosis Date  . Obesity   . Carpal tunnel syndrome   . Hypertension   . GERD (gastroesophageal reflux disease)   . Chronic back pain   . Arthritis   . Hyperlipidemia     Past Surgical History  Procedure Laterality Date  . Abdominal hysterectomy    . Carpal tunnel release    . Cholecystectomy    . Tonsillectomy    . Cesarean section    . Ovarian tumor  2005  . Colonoscopy N/A 09/21/2012    Procedure: COLONOSCOPY;  Surgeon: Danie Binder, MD;  Location: [facility];  Service: Endoscopy;  Laterality: N/A;  9:30    Allergies  Allergen Reactions  . Codeine Itching  . Morphine Itching    History  Substance Use Topics  . Smoking status: Former Research scientist (life sciences)  . Smokeless tobacco: Not on file     Comment: quit smoking 2011  . Alcohol Use: Yes     Comment: occasionally    Family History  Problem Relation Age of Onset  . Hypertension Mother   . Hypertension Father   . Hypertension Brother   . Cancer Maternal Aunt   . Cancer Maternal Uncle     liver  . Cancer Paternal Uncle     bone cancer  . Diabetes Maternal Grandmother   . Colon cancer Neg Hx    Prior to Admission medications   Medication Sig Start Date End Date Taking? Authorizing Provider  ALPRAZolam Duanne Moron) 1 MG tablet TAKE 1 TABLET BY MOUTH AT BEDTIME AS NEEDED FOR SLEEP 11/13/13  Yes Fayrene Helper, MD  cyclobenzaprine (FLEXERIL) 10 MG tablet Take 1 tablet (10 mg total) by mouth at bedtime. 11/13/13  Yes Fayrene Helper, MD  Lorcaserin HCl 10 MG TABS Take 1 tablet by mouth 2 (two) times daily. Patient not taking: Reported on 01/15/2014  11/26/13   Fayrene Helper, MD  naproxen (NAPROSYN) 500 MG tablet TAKE ONE TABLET BY MOUTH TWICE DAILY AS NEEDED FOR PAIN 01/06/14  Yes Fayrene Helper, MD  NEXIUM 40 MG capsule Take 1 capsule (40 mg total) by mouth daily at 12 noon. 12/23/13  Yes Fayrene Helper, MD  triamterene-hydrochlorothiazide (MAXZIDE-25) 37.5-25 MG per tablet TAKE ONE & ONE-HALF TABLETS BY MOUTH ONCE DAILY Patient taking differently: TAKE ONE TABLET BY MOUTH ONCE DAILY 09/26/13  Yes Fayrene Helper, MD     Review of Systems  Positive ROS: As above  All other systems have been reviewed and were otherwise negative with the exception of those mentioned in the HPI and as above.  Objective: Vital signs in last 24 hours: Temp:  [98 F (36.7 C)] 98 F (36.7 C) (01/20 0910) Pulse Rate:  [95] 95 (01/20 0910) Resp:  [18] 18 (01/20 0910) BP: (165)/(96) 165/96 mmHg (01/20 0910) SpO2:  [100 %] 100 % (01/20 0910) Weight:  [136.079 kg (300 lb)] 136.079 kg (300 lb) (01/20 0910)  General Appearance: Alert, cooperative, no distress, Head: Normocephalic, without obvious abnormality, atraumatic Eyes: PERRL, conjunctiva/corneas clear, EOM's intact,    Ears: Normal  Throat: Normal  Neck: Supple, symmetrical, trachea midline, no adenopathy; thyroid: No enlargement/tenderness/nodules;  no carotid bruit or JVD Back: Symmetric, no curvature, ROM normal, no CVA tenderness Lungs: Clear to auscultation bilaterally, respirations unlabored Heart: Regular rate and rhythm, no murmur, rub or gallop Abdomen: Soft, non-tender,, no masses, no organomegaly Extremities: Extremities normal, atraumatic, no cyanosis or edema Pulses: 2+ and symmetric all extremities Skin: Skin color, texture, turgor normal, no rashes or lesions  NEUROLOGIC:   Mental status: alert and oriented, no aphasia, good attention span, Fund of knowledge/ memory ok Motor Exam - grossly normal Sensory Exam - grossly normal Reflexes:  Coordination - grossly  normal Gait - grossly normal Balance - grossly normal Cranial Nerves: I: smell Not tested  II: visual acuity  OS: Normal  OD: Normal   II: visual fields Full to confrontation  II: pupils Equal, round, reactive to light  III,VII: ptosis None  III,IV,VI: extraocular muscles  Full ROM  V: mastication Normal  V: facial light touch sensation  Normal  V,VII: corneal reflex  Present  VII: facial muscle function - upper  Normal  VII: facial muscle function - lower Normal  VIII: hearing Not tested  IX: soft palate elevation  Normal  IX,X: gag reflex Present  XI: trapezius strength  5/5  XI: sternocleidomastoid strength 5/5  XI: neck flexion strength  5/5  XII: tongue strength  Normal    Data Review Lab Results  Component Value Date   WBC 11.0* 01/16/2014   HGB 15.6* 01/16/2014   HCT 46.6* 01/16/2014   MCV 87.6 01/16/2014   PLT 218 01/16/2014   Lab Results  Component Value Date   NA 138 01/16/2014   K 3.5 01/16/2014   CL 102 01/16/2014   CO2 28 01/16/2014   BUN 16 01/16/2014   CREATININE 0.80 01/16/2014   GLUCOSE 92 01/16/2014   No results found for: INR, PROTIME  Assessment/Plan: L4-5 spondylolisthesis, spinal stenosis, facet arthropathy, lumbago, lumbar radiculopathy, neurogenic claudication: I have discussed the situation with the patient. I have reviewed her imaging studies with her and pointed out the abnormalities. We have discussed the various treatment options including surgery. I described the surgical treatment option of an L4-5 decompression, instrumentation, and fusion. I have shown her surgical models. We have discussed the risks, benefits, alternatives, and likelihood of achieving our goals with surgery. I have answered all the patient's questions. She has decided to proceed with surgery.   Mrytle Bento D 01/22/2014 11:18 AM

## 2014-01-22 NOTE — Anesthesia Preprocedure Evaluation (Addendum)
Anesthesia Evaluation  Patient identified by MRN, date of birth, ID band Patient awake    Reviewed: Allergy & Precautions, NPO status , Patient's Chart, lab work & pertinent test results  Airway Mallampati: III  TM Distance: >3 FB Neck ROM: Full    Dental  (+) Teeth Intact, Dental Advisory Given   Pulmonary former smoker,          Cardiovascular hypertension, Pt. on medications     Neuro/Psych Anxiety negative neurological ROS     GI/Hepatic Neg liver ROS, GERD-  ,  Endo/Other  Morbid obesity  Renal/GU negative Renal ROS     Musculoskeletal  (+) Arthritis -,   Abdominal   Peds  Hematology negative hematology ROS (+)   Anesthesia Other Findings   Reproductive/Obstetrics                            Anesthesia Physical Anesthesia Plan  ASA: III  Anesthesia Plan: General   Post-op Pain Management:    Induction: Intravenous  Airway Management Planned: Oral ETT  Additional Equipment:   Intra-op Plan:   Post-operative Plan: Extubation in OR  Informed Consent: I have reviewed the patients History and Physical, chart, labs and discussed the procedure including the risks, benefits and alternatives for the proposed anesthesia with the patient or authorized representative who has indicated his/her understanding and acceptance.   Dental advisory given  Plan Discussed with: CRNA  Anesthesia Plan Comments:         Anesthesia Quick Evaluation

## 2014-01-22 NOTE — Op Note (Signed)
Brief history: The patient is a 44 year old black female who has complained of backache, buttock, and leg pain consistent with neurogenic claudication. She has failed medical management and was worked up with lumbar x-rays and a lumbar MRI. This demonstrated a L4-5 spondylolisthesis, facet arthropathy, stenosis, etc. I discussed the various treatment options with the patient. She has weighed the risks, benefits, and alternative surgery and decided to proceed with an L4-5 decompression, instrumentation, and fusion.  Preoperative diagnosis: L4-5 spondylolisthesis, Degenerative disc disease, spinal stenosis compressing both the L4 and the L5 nerve roots; lumbago; lumbar radiculopathy  Postoperative diagnosis: The same  Procedure: Bilateral L4 Laminotomy/foraminotomies to decompress the bilateral L4 and L5 nerve roots(the work required to do this was in addition to the work required to do the posterior lumbar interbody fusion because of the patient's spinal stenosis, facet arthropathy. Etc. requiring a wide decompression of the nerve roots.); Right L4-5 transforaminal lumbar interbody fusion with local morselized autograft bone and Kinnex graft extender; insertion of interbody prosthesis at L4-5 (globus peek interbody prosthesis); posterior nonsegmental instrumentation from L4 to L5 with globus titanium pedicle screws and rods; posterior lateral arthrodesis at L4-5 with local morselized autograft bone and Kinnex bone graft extender.  Surgeon: Dr. Earle Gell  Asst.: Dr. Sherley Bounds  Anesthesia: Gen. endotracheal  Estimated blood loss: 250 mL  Drains: One medium Hemovac  Complications: None  Description of procedure: The patient was brought to the operating room by the anesthesia team. General endotracheal anesthesia was induced. The patient was turned to the prone position on the Wilson frame. The patient's lumbosacral region was then prepared with Betadine scrub and Betadine solution. Sterile  drapes were applied.  I then injected the area to be incised with Marcaine with epinephrine solution. I then used the scalpel to make a linear midline incision over the L4-5 interspace. I then used electrocautery to perform a bilateral subperiosteal dissection exposing the spinous process and lamina of L4 and L5. We then obtained intraoperative radiograph to confirm our location. We then inserted the Verstrac retractor to provide exposure.  I began the decompression by using the high speed drill to perform laminotomies at L4. We then used the Kerrison punches to widen the laminotomy and removed the ligamentum flavum at L4-5 bilaterally. We used the Kerrison punches to remove the medial facets at L4-5 bilaterally. We performed wide foraminotomies about the bilateral L4 and L5 nerve roots completing the decompression.  We now turned our attention to the posterior lumbar interbody fusion. I used a scalpel to incise the intervertebral disc at L4-5 bilaterally. I then performed a partial intervertebral discectomy at L4-5 bilaterally using the pituitary forceps. We prepared the vertebral endplates at H2-0 bilaterally for the fusion by removing the soft tissues with the curettes. We then used the trial spacers to pick the appropriate sized interbody prosthesis. We prefilled his prosthesis with a combination of local morselized autograft bone that we obtained during the decompression as well as Kinnex bone graft extender. We inserted the prefilled prosthesis into the interspace at L4-5 from the right. I expanded the prosthesis. There was a good snug fit of the prosthesis in the interspace. We then filled and the remainder of the intervertebral disc space with local morselized autograft bone and Kinnex. This completed the posterior lumbar interbody arthrodesis.  We now turned attention to the instrumentation. Under fluoroscopic guidance we cannulated the bilateral L4 and L5 pedicles with the bone probe. We then  removed the bone probe. We then tapped the pedicle with  a 5.5 and 6.5 millimeter tap. We then removed the tap. We probed inside the tapped pedicle with a ball probe to rule out cortical breaches. We then inserted a 7.5 x 45, and 6.5 x 45 millimeter pedicle screw into the L4 and L5 pedicles bilaterally under fluoroscopic guidance. We then palpated along the medial aspect of the pedicles to rule out cortical breaches. There were none. The nerve roots were not injured. We then connected the unilateral pedicle screws with a lordotic rod. We compressed the construct and secured the rod in place with the caps. We then tightened the caps appropriately. This completed the instrumentation from L4-5.  We now turned our attention to the posterior lateral arthrodesis at L4-5. We used the high-speed drill to decorticate the remainder of the facets, pars, transverse process at L4-5. We then applied a combination of local morselized autograft bone and Kinnex bone graft extender over these decorticated posterior lateral structures. This completed the posterior lateral arthrodesis.  We then obtained hemostasis using bipolar electrocautery. We irrigated the wound out with bacitracin solution. We inspected the thecal sac and nerve roots and noted they were well decompressed. We then removed the retractor. We placed a medium Hemovac drain in the epidural space and tunneled out through separate stab wound. We reapproximated patient's thoracolumbar fascia with interrupted #1 Vicryl suture. We reapproximated patient's subcutaneous tissue with interrupted 2-0 Vicryl suture. The reapproximated patient's skin with Steri-Strips and benzoin. The wound was then coated with bacitracin ointment. A sterile dressing was applied. The drapes were removed. The patient was subsequently returned to the supine position where they were extubated by the anesthesia team. He was then transported to the post anesthesia care unit in stable condition. All  sponge instrument and needle counts were reportedly correct at the end of this case.

## 2014-01-22 NOTE — Transfer of Care (Signed)
Immediate Anesthesia Transfer of Care Note  Patient: Stephanie Cannon  Procedure(s) Performed: Procedure(s) with comments: Lumbar four-five laminectomy with posterior lumbar interbody fusion with interbody prosthesis posterior lateral arthrodesis and posterior nonsegmental instrumentation (N/A) - L45 laminectomy with posterior lumbar interbody fusion with interbody prosthesis posterior lateral arthrodesis and posterior nonsegmental instrumentation  Patient Location: PACU  Anesthesia Type:General  Level of Consciousness: awake, alert  and oriented  Airway & Oxygen Therapy: Patient Spontanous Breathing  Post-op Assessment: Report given to PACU RN  Post vital signs: Reviewed and stable  Complications: No apparent anesthesia complications

## 2014-01-23 ENCOUNTER — Encounter (HOSPITAL_COMMUNITY): Payer: Self-pay | Admitting: General Practice

## 2014-01-23 LAB — BASIC METABOLIC PANEL
Anion gap: 7 (ref 5–15)
BUN: 6 mg/dL (ref 6–23)
CALCIUM: 8.5 mg/dL (ref 8.4–10.5)
CHLORIDE: 100 meq/L (ref 96–112)
CO2: 32 mmol/L (ref 19–32)
Creatinine, Ser: 0.81 mg/dL (ref 0.50–1.10)
GFR calc Af Amer: >90 mL/min (ref >90)
GFR, EST NON AFRICAN AMERICAN: 87 mL/min — AB (ref >90)
Glucose, Bld: 104 mg/dL — ABNORMAL HIGH (ref 70–99)
Potassium: 3.5 mmol/L (ref 3.5–5.1)
Sodium: 139 mmol/L (ref 135–145)

## 2014-01-23 LAB — CBC
HCT: 36.4 % (ref 36.0–46.0)
HEMOGLOBIN: 11.6 g/dL — AB (ref 12.0–15.0)
MCH: 28.9 pg (ref 26.0–34.0)
MCHC: 31.9 g/dL (ref 30.0–36.0)
MCV: 90.8 fL (ref 78.0–100.0)
Platelets: 198 10*3/uL (ref 150–400)
RBC: 4.01 MIL/uL (ref 3.87–5.11)
RDW: 14.3 % (ref 11.5–15.5)
WBC: 10.1 10*3/uL (ref 4.0–10.5)

## 2014-01-23 MED ORDER — SENNA 8.6 MG PO TABS
1.0000 | ORAL_TABLET | Freq: Every day | ORAL | Status: DC | PRN
Start: 2014-01-23 — End: 2014-01-24
  Administered 2014-01-23: 8.6 mg via ORAL
  Filled 2014-01-23: qty 1

## 2014-01-23 MED ORDER — DIPHENHYDRAMINE HCL 50 MG/ML IJ SOLN: 25.0000 mg | Freq: Four times a day (QID) | INTRAMUSCULAR | Status: DC | PRN

## 2014-01-23 MED ORDER — DIPHENHYDRAMINE HCL 50 MG/ML IJ SOLN
INTRAMUSCULAR | Status: AC
  Filled 2014-01-23: qty 1

## 2014-01-23 NOTE — Evaluation (Signed)
Physical Therapy Evaluation Patient Details Name: Stephanie Cannon MRN: [id number] DOB: 1970/12/13 Today's Date: 01/23/2014   History of Present Illness  44 year old black female who has had chronic back and leg pain. She has failed medical management and was worked up with a lumbar MRI and lumbar x-rays which demonstrated a L4-5 spondylolisthesis, facet arthropathy, etc. S/p L4/5 decompression/fusion.    Clinical Impression  Pt demonstrates decreased mobility, decreased overall strengthening and increased pain.  Tolerated short distance in hallway with RW at close S to min/guard level with cues for maintaining back precautions.  Feel she will greatly benefit from OT consult for adaptive equipment for bathing/dressing.  Pt will benefit from acute PT to address deficits.  PT recommends HHPT for follow up at D/C to maximize pts safety and function.     Follow Up Recommendations Home health PT;Supervision for mobility/OOB    Equipment Recommendations  Rolling walker with 5" wheels;3in1 (PT) (will need bari RW and bari 3in1)    Recommendations for Other Services OT consult     Precautions / Restrictions Precautions Precautions: Back;Fall Precaution Booklet Issued: Yes (comment) Precaution Comments: Provided handout and education on back precautions.  Required Braces or Orthoses: Spinal Brace Spinal Brace: Lumbar corset;Applied in sitting position Restrictions Weight Bearing Restrictions: No      Mobility  Bed Mobility Overal bed mobility: Needs Assistance Bed Mobility: Rolling;Sidelying to Sit Rolling: Min guard Sidelying to sit: Min assist       General bed mobility comments: Performed bed mobility with min A with education for log rolling technique to maintain back precautions.  Pt able to roll mostly at S level, however did require assist for elevating trunk to sitting position.  Performed with HOB flat and without rails to better simulate home.   Transfers Overall transfer  level: Needs assistance Equipment used: Rolling walker (2 wheeled) Transfers: Sit to/from Stand Sit to Stand: Min guard         General transfer comment: Cues for hand placement and safety.  Pt with difficulty scooting to EOB prior to standing, provided cues for hand placement on bed and scooting to prevent twisting.   Ambulation/Gait Ambulation/Gait assistance: Min guard;Supervision Ambulation Distance (Feet): 50 Feet (and another 57' in to/from restroom) Assistive device: Rolling walker (2 wheeled) Gait Pattern/deviations: Step-through pattern;Decreased stride length;Shuffle;Decreased weight shift to right;Decreased weight shift to left;Wide base of support Gait velocity: very slow   General Gait Details: Pt with very slow shuffled gait pattern.  Feel that gait speed and wide BOS likely due to body habitus.  Cues for maintaining position inside of RW during gait, as well as upright posture and safety with turns.   Stairs            Wheelchair Mobility    Modified Rankin (Stroke Patients Only)       Balance Overall balance assessment: Needs assistance Sitting-balance support: No upper extremity supported;Feet supported Sitting balance-Leahy Scale: Good     Standing balance support: During functional activity;No upper extremity supported Standing balance-Leahy Scale: Fair Standing balance comment: Pt able to stand at sink to wash/dry hands without UE support .                              Pertinent Vitals/Pain Pain Assessment: 0-10 Pain Score: 6  Pain Location: low back at incision Pain Descriptors / Indicators: Aching Pain Intervention(s): Premedicated before session;Repositioned;Monitored during session    Home Living Family/patient expects to be  discharged to:: Private residence Living Arrangements: Parent Available Help at Discharge: Family;Available 24 hours/day Type of Home: House Home Access: Stairs to enter Entrance Stairs-Rails:  None Entrance Stairs-Number of Steps: 2 Home Layout: One level Home Equipment: None      Prior Function Level of Independence: Independent               Hand Dominance        Extremity/Trunk Assessment               Lower Extremity Assessment: Generalized weakness      Cervical / Trunk Assessment: Normal  Communication   Communication: No difficulties  Cognition Arousal/Alertness: Awake/alert Behavior During Therapy: WFL for tasks assessed/performed Overall Cognitive Status: Within Functional Limits for tasks assessed                      General Comments General comments (skin integrity, edema, etc.): still has hemovac, provided education on how to maintain back precautions during functional activities.     Exercises        Assessment/Plan    PT Assessment Patient needs continued PT services  PT Diagnosis Difficulty walking;Generalized weakness;Acute pain   PT Problem List Decreased strength;Decreased activity tolerance;Decreased balance;Decreased mobility;Decreased knowledge of use of DME;Decreased knowledge of precautions;Pain;Obesity  PT Treatment Interventions DME instruction;Gait training;Stair training;Functional mobility training;Therapeutic activities;Therapeutic exercise;Balance training;Patient/family education   PT Goals (Current goals can be found in the Care Plan section) Acute Rehab PT Goals Patient Stated Goal: to return home with mother PT Goal Formulation: With patient/family Time For Goal Achievement: 01/30/14 Potential to Achieve Goals: Good    Frequency Min 5X/week   Barriers to discharge        Co-evaluation               End of Session Equipment Utilized During Treatment: Back brace Activity Tolerance: Patient tolerated treatment well;Patient limited by fatigue Patient left: in chair;with call bell/phone within reach;with family/visitor present Nurse Communication: Mobility status         Time:  8466-5993 PT Time Calculation (min) (ACUTE ONLY): 38 min   Charges:   PT Evaluation $Initial PT Evaluation Tier I: 1 Procedure PT Treatments $Gait Training: 8-22 mins $Therapeutic Activity: 8-22 mins   PT G Codes:        Denice Bors 01/23/2014, 9:36 AM

## 2014-01-23 NOTE — Progress Notes (Signed)
oob in hallway with staff, assisted by mother. No complications noted. !200 ft.

## 2014-01-23 NOTE — Anesthesia Postprocedure Evaluation (Signed)
  Anesthesia Post-op Note  Patient: Stephanie Cannon  Procedure(s) Performed: Procedure(s) with comments: Lumbar four-five laminectomy with posterior lumbar interbody fusion with interbody prosthesis posterior lateral arthrodesis and posterior nonsegmental instrumentation (N/A) - L45 laminectomy with posterior lumbar interbody fusion with interbody prosthesis posterior lateral arthrodesis and posterior nonsegmental instrumentation  Patient Location: PACU  Anesthesia Type:General  Level of Consciousness: awake and alert   Airway and Oxygen Therapy: Patient Spontanous Breathing  Post-op Pain: none  Post-op Assessment: Post-op Vital signs reviewed  Post-op Vital Signs: Reviewed  Last Vitals:  Filed Vitals:   01/23/14 0500  BP: 101/66  Pulse: 91  Temp: 37.5 C  Resp: 20    Complications: No apparent anesthesia complications

## 2014-01-23 NOTE — Progress Notes (Signed)
CARE MANAGEMENT NOTE 01/23/2014  Patient:  Stephanie Cannon, Stephanie Cannon   Account Number:  [id number]  Date Initiated:  01/23/2014  Documentation initiated by:  Olga Coaster  Subjective/Objective Assessment:   ADMITTED FOR SURGERY     Action/Plan:   CM FOLLOWING FOR DCP   Anticipated DC Date:  01/26/2014   Anticipated DC Plan:  AWAITING FOR PT/OT EVALS FOR DISPOSITION NEEDS     DC Planning Services  CM consult        Status of service:  In process, will continue to follow Medicare Important Message given?   (If response is "NO", the following Medicare IM given date fields will be blank)  Per UR Regulation:  Reviewed for med. necessity/level of care/duration of stay  Comments:  1/20/2016Mindi Slicker RN,BSN,MHA [phone]

## 2014-01-23 NOTE — Progress Notes (Signed)
Orthopedic Tech Progress Note Patient Details:  Stephanie Cannon 05/04/1970 [id number] Spoke with nurse who checked with patient to ensure she had back brace. Nurse stated PT was working with patient and she had brace on. No action needed from Ortho at this time.  Patient ID: Stephanie Cannon, female   DOB: February 04, 1970, 44 y.o.   MRN: [id number]   Fenton Foy 01/23/2014, 9:30 AM

## 2014-01-23 NOTE — Progress Notes (Signed)
Patient ID: Stephanie Cannon, female   DOB: 1970-04-01, 44 y.o.   MRN: [id number] Subjective:  The patient is alert and pleasant. She looks well. Her back is appropriately sore.  Objective: Vital signs in last 24 hours: Temp:  [97.7 F (36.5 C)-99.5 F (37.5 C)] 99.4 F (37.4 C) (01/21 0950) Pulse Rate:  [80-100] 93 (01/21 0950) Resp:  [13-21] 20 (01/21 0950) BP: (101-152)/(63-86) 143/68 mmHg (01/21 0950) SpO2:  [94 %-100 %] 94 % (01/21 0950)  Intake/Output from previous day: 01/20 0701 - 01/21 0700 In: 3300 [I.V.:2800; IV Piggyback:500] Out: 9518 [Urine:3375; Drains:80; Blood:200] Intake/Output this shift:    Physical exam the patient is alert and oriented 3. Her strength is grossly normal in her lower extremities.  Lab Results:  Recent Labs  01/23/14 0544  WBC 10.1  HGB 11.6*  HCT 36.4  PLT 198   BMET  Recent Labs  01/23/14 0544  NA 139  K 3.5  CL 100  CO2 32  GLUCOSE 104*  BUN 6  CREATININE 0.81  CALCIUM 8.5    Studies/Results: Dg Lumbar Spine 2-3 Views  01/22/2014   CLINICAL DATA:  PLIF L4-5 intraoperative localization  EXAM: LUMBAR SPINE - 2-3 VIEW; DG C-ARM 61-120 MIN  TECHNIQUE: Four spot views  FLUOROSCOPY TIME:  0 min 21 seconds  COMPARISON:  07/26/2013  FINDINGS: On image 1 there is a surgical device projecting over the L4-5 disc space. This is also seen on the second image. On the third image there is fusion device in the L4-5 disc space. This is also seen on the frontal view, image 4. Image 3 and image 4 demonstrate transpedicular screws at the L4-L5 level bilaterally.  IMPRESSION: Postsurgical change with intraoperative localization as described.   Electronically Signed   By: Skipper Cliche M.D.   On: 01/22/2014 16:31   Dg Lumbar Spine 1 View  01/22/2014   CLINICAL DATA:  44 year old female with back pain surgery for L4-L5 posterior lumbar interbody fusion.  EXAM: LUMBAR SPINE - 1 VIEW  COMPARISON:  08/30/2013  FINDINGS: Single lateral intraoperative  spot film during posterior lumbar interbody fusion.  Self retaining retractor overlies the L4 level, with a surgical curette identifying the level of the L4 pedicle.  Surgical clips within the abdomen.  Surgical packing overlying L4-L5.  Degenerative disc disease at L5-S1 is most pronounced.  IMPRESSION: Intraoperative cross-table lateral during posterior lumbar interbody fusion demonstrates surgical curette identifying the pedicle of L4.  Signed,  Dulcy Fanny. Earleen Newport, DO  [facility]  [facility]   Electronically Signed   By: Corrie Mckusick D.O.   On: 01/22/2014 16:02   Dg C-arm 1-60 Min  01/22/2014   CLINICAL DATA:  PLIF L4-5 intraoperative localization  EXAM: LUMBAR SPINE - 2-3 VIEW; DG C-ARM 61-120 MIN  TECHNIQUE: Four spot views  FLUOROSCOPY TIME:  0 min 21 seconds  COMPARISON:  07/26/2013  FINDINGS: On image 1 there is a surgical device projecting over the L4-5 disc space. This is also seen on the second image. On the third image there is fusion device in the L4-5 disc space. This is also seen on the frontal view, image 4. Image 3 and image 4 demonstrate transpedicular screws at the L4-L5 level bilaterally.  IMPRESSION: Postsurgical change with intraoperative localization as described.   Electronically Signed   By: Skipper Cliche M.D.   On: 01/22/2014 16:31    Assessment/Plan: Postop day #1: The patient is doing well. We will discontinue her Hemovac  and Hep-Lock her IV. She will likely go home this weekend.  LOS: 1 day     Skyrah Krupp D 01/23/2014, 12:25 PM

## 2014-01-24 MED ORDER — BISACODYL 10 MG RE SUPP
10.0000 mg | Freq: Every day | RECTAL | Status: DC | PRN
  Administered 2014-01-24: 10 mg via RECTAL
  Filled 2014-01-24: qty 1

## 2014-01-24 MED ORDER — HYDROMORPHONE HCL 4 MG PO TABS: 4.0000 mg | ORAL_TABLET | ORAL | Status: DC | PRN

## 2014-01-24 MED ORDER — KETOROLAC TROMETHAMINE 30 MG/ML IJ SOLN: 30.0000 mg | Freq: Four times a day (QID) | INTRAMUSCULAR | Status: DC | PRN

## 2014-01-24 MED ORDER — DSS 100 MG PO CAPS: 100.0000 mg | ORAL_CAPSULE | Freq: Two times a day (BID) | ORAL | Status: DC

## 2014-01-24 MED ORDER — DIAZEPAM 5 MG PO TABS: 5.0000 mg | ORAL_TABLET | Freq: Four times a day (QID) | ORAL | Status: DC | PRN

## 2014-01-24 NOTE — Discharge Summary (Signed)
Physician Discharge Summary  Patient ID: Stephanie Cannon MRN: [id number] DOB/AGE: 02/20/1970 44 y.o.  Admit date: 01/22/2014 Discharge date: 01/24/2014  Admission Diagnoses: L4-5 spondylolisthesis, facet arthropathy, lumbago, lumbar radiculopathy  Discharge Diagnoses: The same Active Problems:   Spondylolisthesis of lumbar region   Discharged Condition: good  Hospital Course: I performed an L4-5 decompression, instrumentation, and fusion on the patient on 01/22/2014. The surgery went well.  The patient's postoperative course was unremarkable. On postoperative day #2 the patient, and her mother, requested discharge to home. They were given oral and written discharge instructions. All their questions were answered. She had some itching with Percocet so she was sent home with Dilaudid.  Consults: PT Significant Diagnostic Studies: None Treatments: L4-5 decompression, instrumentation, and fusion. Discharge Exam: Blood pressure 120/64, pulse 104, temperature 98 F (36.7 C), temperature source Oral, resp. rate 16, weight 136.079 kg (300 lb), SpO2 97 %. The patient is alert and pleasant. She looks well. She is in no apparent distress. Her strength is normal.  Disposition: Home  Discharge Instructions    Call MD for:  difficulty breathing, headache or visual disturbances    Complete by:  As directed      Call MD for:  extreme fatigue    Complete by:  As directed      Call MD for:  hives    Complete by:  As directed      Call MD for:  persistant dizziness or light-headedness    Complete by:  As directed      Call MD for:  persistant nausea and vomiting    Complete by:  As directed      Call MD for:  redness, tenderness, or signs of infection (pain, swelling, redness, odor or green/yellow discharge around incision site)    Complete by:  As directed      Call MD for:  severe uncontrolled pain    Complete by:  As directed      Call MD for:  temperature >100.4    Complete by:  As  directed      Diet - low sodium heart healthy    Complete by:  As directed      Discharge instructions    Complete by:  As directed   Call [phone] for a followup appointment. Take a stool softener while you are using pain medications.     Driving Restrictions    Complete by:  As directed   Do not drive for 2 weeks.     Increase activity slowly    Complete by:  As directed      Lifting restrictions    Complete by:  As directed   Do not lift more than 5 pounds. No excessive bending or twisting.     May shower / Bathe    Complete by:  As directed   He may shower after the pain she is removed 3 days after surgery. Leave the incision alone.     Remove dressing in 24 hours    Complete by:  As directed             Medication List    STOP taking these medications        cyclobenzaprine 10 MG tablet  Commonly known as:  FLEXERIL     naproxen 500 MG tablet  Commonly known as:  NAPROSYN      TAKE these medications        ALPRAZolam 1 MG tablet  Commonly known as:  Duanne Moron  TAKE 1 TABLET BY MOUTH AT BEDTIME AS NEEDED FOR SLEEP     diazepam 5 MG tablet  Commonly known as:  VALIUM  Take 1 tablet (5 mg total) by mouth every 6 (six) hours as needed for muscle spasms.     DSS 100 MG Caps  Take 100 mg by mouth 2 (two) times daily.     HYDROmorphone 4 MG tablet  Commonly known as:  DILAUDID  Take 1 tablet (4 mg total) by mouth every 4 (four) hours as needed for severe pain.     Lorcaserin HCl 10 MG Tabs  Take 1 tablet by mouth 2 (two) times daily.     NEXIUM 40 MG capsule  Generic drug:  esomeprazole  Take 1 capsule (40 mg total) by mouth daily at 12 noon.     triamterene-hydrochlorothiazide 37.5-25 MG per tablet  Commonly known as:  MAXZIDE-25  TAKE ONE & ONE-HALF TABLETS BY MOUTH ONCE DAILY         Signed: Ophelia Charter 01/24/2014, 8:02 AM

## 2014-01-24 NOTE — Progress Notes (Signed)
Reviewed discharge instructions with patient and mother. Pt verbalized understanding of back precautions, follow up appointments and incision care. 2 rx. Given. Pt was escorted to the front lobby by nurse and nurse tech, and transported to home in [place] by spouse.

## 2014-01-24 NOTE — Progress Notes (Signed)
Physical Therapy Treatment Patient Details Name: Stephanie Cannon MRN: [id number] DOB: [birth year] Today's Date: 01/24/2014    History of Present Illness 44 year old black female who has had chronic back and leg pain. She has failed medical management and was worked up with a lumbar MRI and lumbar x-rays which demonstrated a L4-5 spondylolisthesis, facet arthropathy, etc. S/p L4/5 decompression/fusion.      PT Comments    Patient able to practice stairs and bed mobility, but still limited tolerance to gait due to pain and "not feeling right".  Will benefit from HHPT and family assist at d/c.  Follow Up Recommendations  Home health PT;Supervision for mobility/OOB     Equipment Recommendations  Rolling walker with 5" wheels;3in1 (PT)    Recommendations for Other Services       Precautions / Restrictions Precautions Precautions: Fall;Back Required Braces or Orthoses: Spinal Brace Spinal Brace: Lumbar corset;Applied in sitting position    Mobility  Bed Mobility Overal bed mobility: Needs Assistance Bed Mobility: Rolling;Sidelying to Sit;Sit to Sidelying Rolling: Supervision Sidelying to sit: Min guard     Sit to sidelying: Supervision General bed mobility comments: cues to perform with precautions HOB flat and no rail  Transfers   Equipment used: Rolling walker (2 wheeled) Transfers: Sit to/from Stand Sit to Stand: Supervision         General transfer comment: increased time, pulls up on walker at times  Ambulation/Gait Ambulation/Gait assistance: Supervision Ambulation Distance (Feet): 50 Feet (and 10') Assistive device: Rolling walker (2 wheeled) Gait Pattern/deviations: Step-through pattern;Decreased step length - right;Wide base of support Gait velocity: c/o not feeling well and left LE pain limiting ambulation to gym for stair practice; obtained recliner and pt wheeled to gym   General Gait Details: Limited due to pain, weak, lightheaded   Stairs   Stairs  assistance: Min assist Stair Management: One rail Right;Step to pattern;Forwards Number of Stairs: 3 General stair comments: patient has wall next to stairs at home, utilized railing to simulate with hand held assist on left.  Wheelchair Mobility    Modified Rankin (Stroke Patients Only)       Balance             Standing balance-Leahy Scale: Fair Standing balance comment: walked from stairs to chair no device                    Cognition Arousal/Alertness: Awake/alert Behavior During Therapy: WFL for tasks assessed/performed Overall Cognitive Status: Within Functional Limits for tasks assessed                      Exercises      General Comments General comments (skin integrity, edema, etc.): educated on technique for transfer into truck w/running board; reviewed sleeping positions and referred to handout for precautions with all ADL's      Pertinent Vitals/Pain Pain Score: 7  Pain Location: left leg Pain Descriptors / Indicators: Aching Pain Intervention(s): Limited activity within patient's tolerance;Monitored during session;Patient requesting pain meds-RN notified    Home Living                      Prior Function            PT Goals (current goals can now be found in the care plan section) Progress towards PT goals: Progressing toward goals    Frequency  Min 5X/week    PT Plan Current plan remains appropriate    Co-evaluation  End of Session Equipment Utilized During Treatment: Back brace Activity Tolerance: Patient limited by fatigue Patient left: in chair;with call bell/phone within reach;with chair alarm set;with family/visitor present     Time: 6195-0932 PT Time Calculation (min) (ACUTE ONLY): 33 min  Charges:  $Gait Training: 8-22 mins $Therapeutic Activity: 8-22 mins                    G Codes:      WYNN,CYNDI Feb 19, 2014, 9:37 AM Magda Kiel, [place] February 19, 2014

## 2014-01-29 ENCOUNTER — Ambulatory Visit (HOSPITAL_COMMUNITY): Payer: BC Managed Care – PPO

## 2014-01-29 ENCOUNTER — Other Ambulatory Visit: Payer: Self-pay | Admitting: Family Medicine

## 2014-02-05 ENCOUNTER — Ambulatory Visit: Payer: BC Managed Care – PPO | Admitting: Dietician

## 2014-02-05 ENCOUNTER — Other Ambulatory Visit (HOSPITAL_COMMUNITY): Payer: BC Managed Care – PPO

## 2014-02-05 ENCOUNTER — Telehealth: Payer: Self-pay

## 2014-02-06 ENCOUNTER — Encounter (HOSPITAL_COMMUNITY): Payer: Self-pay | Admitting: Neurosurgery

## 2014-02-10 ENCOUNTER — Other Ambulatory Visit: Payer: Self-pay | Admitting: Family Medicine

## 2014-02-21 ENCOUNTER — Other Ambulatory Visit: Payer: Self-pay

## 2014-02-21 ENCOUNTER — Other Ambulatory Visit: Payer: Self-pay | Admitting: Family Medicine

## 2014-02-21 MED ORDER — DEXLANSOPRAZOLE 60 MG PO CPDR: 60.0000 mg | DELAYED_RELEASE_CAPSULE | Freq: Every day | ORAL | Status: DC

## 2014-02-21 NOTE — Telephone Encounter (Signed)
Patient aware that insurance will not pay for brand name Nexium.    She would like to know is there a possibility that she can try Dexilant

## 2014-02-21 NOTE — Telephone Encounter (Signed)
Script entere, pls print and fax and let her know

## 2014-02-21 NOTE — Telephone Encounter (Signed)
Called and left message for patient notifying of medication being sent to pharmacy

## 2014-02-21 NOTE — Telephone Encounter (Signed)
Med sent to [facility]

## 2014-02-24 ENCOUNTER — Ambulatory Visit (HOSPITAL_COMMUNITY): Admission: RE | Admit: 2014-02-24 | Payer: BC Managed Care – PPO | Source: Ambulatory Visit | Admitting: Surgery

## 2014-02-24 ENCOUNTER — Encounter (HOSPITAL_COMMUNITY): Admission: RE | Payer: Self-pay | Source: Ambulatory Visit

## 2014-02-24 SURGERY — BREATH TEST, FOR HELICOBACTER PYLORI

## 2014-02-25 ENCOUNTER — Ambulatory Visit (HOSPITAL_COMMUNITY)
Admission: RE | Admit: 2014-02-25 | Discharge: 2014-02-25 | Disposition: A | Discharge status: Home / Self Care | Payer: BC Managed Care – PPO | Source: Ambulatory Visit | Attending: Surgery | Admitting: Surgery

## 2014-02-25 ENCOUNTER — Encounter: Payer: BC Managed Care – PPO | Attending: Surgery | Admitting: Dietician

## 2014-02-25 ENCOUNTER — Encounter: Payer: Self-pay | Admitting: Dietician

## 2014-02-25 DIAGNOSIS — Z9049 Acquired absence of other specified parts of digestive tract: Secondary | ICD-10-CM | POA: Diagnosis not present

## 2014-02-25 DIAGNOSIS — Z713 Dietary counseling and surveillance: Secondary | ICD-10-CM | POA: Diagnosis not present

## 2014-02-25 DIAGNOSIS — Z6841 Body Mass Index (BMI) 40.0 and over, adult: Secondary | ICD-10-CM | POA: Insufficient documentation

## 2014-02-25 DIAGNOSIS — K449 Diaphragmatic hernia without obstruction or gangrene: Secondary | ICD-10-CM | POA: Diagnosis not present

## 2014-02-25 NOTE — Patient Instructions (Signed)

## 2014-02-25 NOTE — Progress Notes (Signed)
  Pre-Op Assessment Visit:  Pre-Operative Sleeve Gastrectomy Surgery  Medical Nutrition Therapy:  Appt start time: 0900   End time:  0930.  Patient was seen on 02/25/2014 for Pre-Operative Nutrition Assessment. Assessment and letter of approval faxed to [facility] Surgery Bariatric Surgery Program coordinator on 02/25/2014.   Preferred Learning Style:   No preference indicated   Learning Readiness:  Ready  Handouts given during visit include:  Pre-Op Goals Bariatric Surgery Protein Shakes   During the appointment today the following Pre-Op Goals were reviewed with the patient: Maintain or lose weight as instructed by your surgeon Make healthy food choices Begin to limit portion sizes Limited concentrated sugars and fried foods Keep fat/sugar in the single digits per serving on   food labels Practice CHEWING your food  (aim for 30 chews per bite or until applesauce consistency) Practice not drinking 15 minutes before, during, and 30 minutes after each meal/snack Avoid all carbonated beverages  Avoid/limit caffeinated beverages  Avoid all sugar-sweetened beverages Consume 3 meals per day; eat every 3-5 hours Make a list of non-food related activities Aim for 64-100 ounces of FLUID daily  Aim for at least 60-80 grams of PROTEIN daily Look for a liquid protein source that contain ?15 g protein and ?5 g carbohydrate  (ex: shakes, drinks, shots)  Patient-Centered Goals: Syrianna would like to become more active. Tuere is confident that she will be able to follow the pre op goals and feels like she will be able to follow them.    Demonstrated degree of understanding via:  Teach Back  Teaching Method Utilized:  Visual Auditory Hands on  Barriers to learning/adherence to lifestyle change: none  [facility] to [facility] the [facility] to enroll in Pre-Op and Post-Op Nutrition Education when surgery date is scheduled.

## 2014-03-11 ENCOUNTER — Telehealth: Payer: Self-pay | Admitting: *Deleted

## 2014-03-11 MED ORDER — LORCASERIN HCL 10 MG PO TABS: 1.0000 | ORAL_TABLET | Freq: Two times a day (BID) | ORAL | Status: DC

## 2014-03-11 NOTE — Telephone Encounter (Signed)
Med refilled.

## 2014-03-11 NOTE — Telephone Encounter (Signed)
Pt called requesting a refill on her diet pill. Please advise

## 2014-03-31 ENCOUNTER — Other Ambulatory Visit (HOSPITAL_COMMUNITY)
Admission: RE | Admit: 2014-03-31 | Discharge: 2014-03-31 | Disposition: A | Discharge status: Home / Self Care | Payer: BC Managed Care – PPO | Source: Ambulatory Visit | Attending: Family Medicine | Admitting: Family Medicine

## 2014-03-31 ENCOUNTER — Ambulatory Visit: Payer: BC Managed Care – PPO

## 2014-03-31 ENCOUNTER — Telehealth: Payer: Self-pay

## 2014-03-31 DIAGNOSIS — Z113 Encounter for screening for infections with a predominantly sexual mode of transmission: Secondary | ICD-10-CM | POA: Diagnosis not present

## 2014-03-31 DIAGNOSIS — N760 Acute vaginitis: Secondary | ICD-10-CM

## 2014-03-31 NOTE — Telephone Encounter (Signed)
Patient will come in this evening to leave specimen.

## 2014-03-31 NOTE — Telephone Encounter (Signed)
Can send urin specimens for wet prap and  Culture, ok to send this pm  Ensure she has ov scheduled for general conditions also pls

## 2014-03-31 NOTE — Progress Notes (Signed)
Patient in with c/o vaginal odor.  Specimen collected for urine cytology.  Patient will be notified with results when available.

## 2014-04-02 LAB — URINE CYTOLOGY ANCILLARY ONLY
Chlamydia: NEGATIVE
Neisseria Gonorrhea: NEGATIVE
Trichomonas: NEGATIVE

## 2014-04-07 LAB — URINE CYTOLOGY ANCILLARY ONLY
BACTERIAL VAGINITIS: NEGATIVE
Candida vaginitis: NEGATIVE

## 2014-04-10 ENCOUNTER — Other Ambulatory Visit: Payer: Self-pay

## 2014-04-10 ENCOUNTER — Telehealth: Payer: Self-pay | Admitting: *Deleted

## 2014-04-10 MED ORDER — PHENTERMINE HCL 37.5 MG PO TABS: 37.5000 mg | ORAL_TABLET | Freq: Every day | ORAL | Status: DC

## 2014-04-10 NOTE — Telephone Encounter (Signed)
Pt called requesting a refill requeston fentermine. Pt is requesting to be called back at work. Please advise

## 2014-04-10 NOTE — Telephone Encounter (Signed)
Needs appt in 4 weeks, OK to send 4 week supply ioonly I will sign, pls

## 2014-04-10 NOTE — Telephone Encounter (Signed)
belviq not covered. Wants an rx for phentermine called in. Ok to refill this?

## 2014-04-10 NOTE — Telephone Encounter (Signed)
1 month only left and left pt a message to call and schedule in 4 weeks

## 2014-04-17 ENCOUNTER — Other Ambulatory Visit: Payer: Self-pay | Admitting: Surgery

## 2014-05-01 ENCOUNTER — Other Ambulatory Visit: Payer: Self-pay | Admitting: Family Medicine

## 2014-05-06 ENCOUNTER — Other Ambulatory Visit: Payer: Self-pay | Admitting: Family Medicine

## 2014-05-08 ENCOUNTER — Other Ambulatory Visit: Payer: Self-pay | Admitting: Family Medicine

## 2014-05-12 ENCOUNTER — Encounter: Payer: BC Managed Care – PPO | Attending: Surgery

## 2014-05-12 DIAGNOSIS — Z6841 Body Mass Index (BMI) 40.0 and over, adult: Secondary | ICD-10-CM | POA: Diagnosis not present

## 2014-05-12 DIAGNOSIS — Z713 Dietary counseling and surveillance: Secondary | ICD-10-CM | POA: Diagnosis not present

## 2014-05-12 NOTE — Progress Notes (Signed)
  Pre-Operative Nutrition Class:  Appt start time: 3870   End time:  1830.  Patient was seen on 05/12/2014 for Pre-Operative Bariatric Surgery Education at [facility].   Surgery date: 06/23/14 Surgery type: Gastric sleeve Start weight at [facility]: 303 lbs on 02/25/14 Weight today: 301 lbs  TANITA  BODY COMP RESULTS  05/12/14   BMI (kg/m^2) 56.9   Fat Mass (lbs) 150   Fat Free Mass (lbs) 151   Total Body Water (lbs) 110.5   Samples given per MNT protocol. Patient educated on appropriate usage: Premier protein shake (chocolate - qty 1) Lot #: [id number] Exp: 11/2014  Celebrate multivitamin (mandarin orange - qty 1) Lot #: [id number] Exp: 12/2014  PB2 (chocolate - qty 1) Lot #: none Exp: 11/2014  Unjury Protein Powder (unflavored - qty 1) Lot #: [id number] Exp: 02/2015  The following the learning objectives were met by the patient during this course:  Identify Pre-Op Dietary Goals and will begin 2 weeks pre-operatively  Identify appropriate sources of fluids and proteins   State protein recommendations and appropriate sources pre and post-operatively  Identify Post-Operative Dietary Goals and will follow for 2 weeks post-operatively  Identify appropriate multivitamin and calcium sources  Describe the need for physical activity post-operatively and will follow MD recommendations  State when to call healthcare provider regarding medication questions or post-operative complications  Handouts given during class include:  Pre-Op Bariatric Surgery Diet Handout  Protein Shake Handout  Post-Op Bariatric Surgery Nutrition Handout  B[facility] Program Information Flyer  Support Group Information Flyer  [facility] Bariatric Supplements Price List  Follow-Up Plan: Patient will follow-up at [facility] 2 weeks post operatively for diet advancement per MD.

## 2014-05-15 ENCOUNTER — Other Ambulatory Visit: Payer: Self-pay

## 2014-05-15 MED ORDER — PHENTERMINE HCL 37.5 MG PO TABS: 37.5000 mg | ORAL_TABLET | Freq: Every day | ORAL | Status: DC

## 2014-05-15 MED ORDER — DEXLANSOPRAZOLE 60 MG PO CPDR: 60.0000 mg | DELAYED_RELEASE_CAPSULE | Freq: Every day | ORAL | Status: DC

## 2014-05-15 MED ORDER — CYCLOBENZAPRINE HCL 10 MG PO TABS: 10.0000 mg | ORAL_TABLET | Freq: Every day | ORAL | Status: DC

## 2014-05-15 MED ORDER — ALPRAZOLAM 1 MG PO TABS: 1.0000 mg | ORAL_TABLET | Freq: Every evening | ORAL | Status: DC | PRN

## 2014-05-15 MED ORDER — TRIAMTERENE-HCTZ 37.5-25 MG PO TABS: ORAL_TABLET | ORAL | Status: DC

## 2014-05-16 ENCOUNTER — Other Ambulatory Visit: Payer: Self-pay

## 2014-05-16 MED ORDER — NEXIUM 24HR 20 MG PO CPDR: 20.0000 mg | DELAYED_RELEASE_CAPSULE | Freq: Every day | ORAL | Status: DC

## 2014-05-16 MED ORDER — ESOMEPRAZOLE MAGNESIUM 20 MG PO CPDR: 20.0000 mg | DELAYED_RELEASE_CAPSULE | Freq: Every day | ORAL | Status: DC

## 2014-05-20 ENCOUNTER — Other Ambulatory Visit: Payer: Self-pay

## 2014-05-29 ENCOUNTER — Ambulatory Visit (INDEPENDENT_AMBULATORY_CARE_PROVIDER_SITE_OTHER): Payer: BC Managed Care – PPO | Admitting: Family Medicine

## 2014-05-29 ENCOUNTER — Encounter: Payer: Self-pay | Admitting: Family Medicine

## 2014-05-29 VITALS — BP 130/74 | HR 100 | Resp 18 | Ht 62.0 in | Wt 301.0 lb

## 2014-05-29 DIAGNOSIS — E8881 Metabolic syndrome: Secondary | ICD-10-CM

## 2014-05-29 DIAGNOSIS — R7303 Prediabetes: Secondary | ICD-10-CM

## 2014-05-29 DIAGNOSIS — R7309 Other abnormal glucose: Secondary | ICD-10-CM

## 2014-05-29 DIAGNOSIS — I10 Essential (primary) hypertension: Secondary | ICD-10-CM

## 2014-05-29 DIAGNOSIS — E785 Hyperlipidemia, unspecified: Secondary | ICD-10-CM

## 2014-05-29 DIAGNOSIS — K219 Gastro-esophageal reflux disease without esophagitis: Secondary | ICD-10-CM

## 2014-05-29 MED ORDER — PHENTERMINE HCL 37.5 MG PO TABS: 37.5000 mg | ORAL_TABLET | Freq: Every day | ORAL | Status: DC

## 2014-05-29 NOTE — Patient Instructions (Signed)
F/u in 4.5 month, call if you need me before  No changes in meds  Thankful back is better .  All the best with upcoming gastric bypass surgery

## 2014-06-04 ENCOUNTER — Telehealth: Payer: Self-pay

## 2014-06-04 MED ORDER — PENICILLIN V POTASSIUM 500 MG PO TABS: 500.0000 mg | ORAL_TABLET | Freq: Three times a day (TID) | ORAL | Status: DC

## 2014-06-04 MED ORDER — BENZONATATE 100 MG PO CAPS: 100.0000 mg | ORAL_CAPSULE | Freq: Two times a day (BID) | ORAL | Status: DC | PRN

## 2014-06-04 NOTE — Telephone Encounter (Signed)
pls let her know pen and decongstant sent to [facility],pls  send in fluconazole 150 mg #2 tabs if needed. Advise eD / urgent care if worsens Offer and send in albutero MDI 2 puffs every 8 hrs as needed of wheezing ONE only if she wants this also pls

## 2014-06-04 NOTE — Telephone Encounter (Signed)
Called and left message notifying of meds being sent to pharmacy.   Will await call back to see if patient needs additional meds.

## 2014-06-16 NOTE — Assessment & Plan Note (Signed)
Controlled, no change in medication DASH diet and commitment to daily physical activity for a minimum of 30 minutes discussed and encouraged, as a part of hypertension management. The importance of attaining a healthy weight is also discussed.  BP/Weight 05/29/2014 05/12/2014 02/25/2014 01/24/2014 01/22/2014 01/16/2014 74/94/4967  Systolic BP 591 - - 638 - 466 599  Diastolic BP 74 - - 70 - 86 84  Wt. (Lbs) 301 301 303.3 - 300 300 298.12  BMI 55.04 56.9 57.34 - 56.71 56.71 54.51

## 2014-06-16 NOTE — Assessment & Plan Note (Signed)
Hyperlipidemia:Low fat diet discussed and encouraged.   Lipid Panel  Lab Results  Component Value Date   CHOL 202* 12/23/2013   HDL 53 12/23/2013   LDLCALC 128* 12/23/2013   TRIG 105 12/23/2013   CHOLHDL 3.8 12/23/2013      Deteriorated

## 2014-06-16 NOTE — Assessment & Plan Note (Signed)
Patient educated about the importance of limiting  Carbohydrate intake , the need to commit to daily physical activity for a minimum of 30 minutes , and to commit weight loss. The fact that changes in all these areas will reduce or eliminate all together the development of diabetes is stressed.  Updated lab needed at/ before next visit.  Diabetic Labs Latest Ref Rng 01/23/2014 01/16/2014 12/23/2013 12/31/2012 09/11/2012  HbA1c <5.7 % - - 5.8(H) 5.8(H) 6.1(H)  Chol 0 - 200 mg/dL - - 202(H) - 205(H)  HDL >39 mg/dL - - 53 - 56  Calc LDL 0 - 99 mg/dL - - 128(H) - 129(H)  Triglycerides <150 mg/dL - - 105 - 99  Creatinine 0.50 - 1.10 mg/dL 0.81 0.80 0.79 0.81 0.69   BP/Weight 05/29/2014 05/12/2014 02/25/2014 01/24/2014 01/22/2014 01/16/2014 03/00/9233  Systolic BP 007 - - 622 - 633 354  Diastolic BP 74 - - 70 - 86 84  Wt. (Lbs) 301 301 303.3 - 300 300 298.12  BMI 55.04 56.9 57.34 - 56.71 56.71 54.51   No flowsheet data found.

## 2014-06-16 NOTE — Progress Notes (Signed)
Subjective:    Patient ID: Stephanie Cannon, female    DOB: September 05, 1970, 44 y.o.   MRN: [id number]  HPI The PT is here for follow up and re-evaluation of chronic medical conditions, medication management and review of any available recent lab and radiology data.  Preventive health is updated, specifically  Cancer screening and Immunization.   Happy with recent back surgery. The PT denies any adverse reactions to current medications since the last visit.  Looking forward to bariatric surgery     Review of Systems See HPI Denies recent fever or chills. Denies sinus pressure, nasal congestion, ear pain or sore throat. Denies chest congestion, productive cough or wheezing. Denies chest pains, palpitations and leg swelling Denies abdominal pain, nausea, vomiting,diarrhea or constipation.   Denies dysuria, frequency, hesitancy or incontinence. Back pain improved since surgery still has  limitation in mobility. Denies headaches, seizures, numbness, or tingling. Denies depression, uncontrolled  anxiety or insomnia. Denies skin break down or rash.        Objective:   Physical Exam BP 130/74 mmHg  Pulse 100  Resp 18  Ht 5\' 2"  (1.575 m)  Wt 301 lb (136.533 kg)  BMI 55.04 kg/m2  SpO2 98%  Patient alert and oriented and in no cardiopulmonary distress.  HEENT: No facial asymmetry, EOMI,   oropharynx pink and moist.  Neck supple no JVD, no mass.  Chest: Clear to auscultation bilaterally.  CVS: S1, S2 no murmurs, no S3.Regular rate.  ABD: Soft non tender.   Ext: No edema  MS: Adequate though reduced  ROM spine, normal in shoulders, hips and knees.  Skin: Intact, no ulcerations or rash noted.  Psych: Good eye contact, normal affect. Memory intact not anxious or depressed appearing.  CNS: CN 2-12 intact, power,  normal throughout.no focal deficits noted.         Assessment & Plan:  Essential hypertension Controlled, no change in medication DASH diet and commitment  to daily physical activity for a minimum of 30 minutes discussed and encouraged, as a part of hypertension management. The importance of attaining a healthy weight is also discussed.  BP/Weight 05/29/2014 05/12/2014 02/25/2014 01/24/2014 01/22/2014 01/16/2014 85/63/1497  Systolic BP 026 - - 378 - 588 502  Diastolic BP 74 - - 70 - 86 84  Wt. (Lbs) 301 301 303.3 - 300 300 298.12  BMI 55.04 56.9 57.34 - 56.71 56.71 54.51        Hyperlipidemia LDL goal <100 Hyperlipidemia:Low fat diet discussed and encouraged.   Lipid Panel  Lab Results  Component Value Date   CHOL 202* 12/23/2013   HDL 53 12/23/2013   LDLCALC 128* 12/23/2013   TRIG 105 12/23/2013   CHOLHDL 3.8 12/23/2013      Deteriorated  Morbid obesity Deteriorated. Patient re-educated about  the importance of commitment to a  minimum of 150 minutes of exercise per week.  The importance of healthy food choices with portion control discussed. Encouraged to start a food diary, count calories and to consider  joining a support group. Sample diet sheets offered. Goals set by the patient for the next several months.   Weight /BMI 05/29/2014 05/12/2014 02/25/2014  WEIGHT 301 lb 301 lb 303 lb 4.8 oz  HEIGHT 5\' 2"  5\' 1"  5\' 1"   BMI 55.04 kg/m2 56.9 kg/m2 57.34 kg/m2    Current exercise per week 60 minutes.   Metabolic syndrome X The increased risk of cardiovascular disease associated with this diagnosis, and the need to consistently work on lifestyle  to change this is discussed. Following  a  heart healthy diet ,commitment to 30 minutes of exercise at least 5 days per week, as well as control of blood sugar and cholesterol , and achieving a healthy weight are all the areas to be addressed .   Prediabetes Patient educated about the importance of limiting  Carbohydrate intake , the need to commit to daily physical activity for a minimum of 30 minutes , and to commit weight loss. The fact that changes in all these areas will reduce or  eliminate all together the development of diabetes is stressed.  Updated lab needed at/ before next visit.  Diabetic Labs Latest Ref Rng 01/23/2014 01/16/2014 12/23/2013 12/31/2012 09/11/2012  HbA1c <5.7 % - - 5.8(H) 5.8(H) 6.1(H)  Chol 0 - 200 mg/dL - - 202(H) - 205(H)  HDL >39 mg/dL - - 53 - 56  Calc LDL 0 - 99 mg/dL - - 128(H) - 129(H)  Triglycerides <150 mg/dL - - 105 - 99  Creatinine 0.50 - 1.10 mg/dL 0.81 0.80 0.79 0.81 0.69   BP/Weight 05/29/2014 05/12/2014 02/25/2014 01/24/2014 01/22/2014 01/16/2014 75/17/0017  Systolic BP 494 - - 496 - 759 163  Diastolic BP 74 - - 70 - 86 84  Wt. (Lbs) 301 301 303.3 - 300 300 298.12  BMI 55.04 56.9 57.34 - 56.71 56.71 54.51   No flowsheet data found.     GERD Controlled, no change in medication

## 2014-06-16 NOTE — Assessment & Plan Note (Signed)
Controlled, no change in medication  

## 2014-06-16 NOTE — Assessment & Plan Note (Signed)
Deteriorated. Patient re-educated about  the importance of commitment to a  minimum of 150 minutes of exercise per week.  The importance of healthy food choices with portion control discussed. Encouraged to start a food diary, count calories and to consider  joining a support group. Sample diet sheets offered. Goals set by the patient for the next several months.   Weight /BMI 05/29/2014 05/12/2014 02/25/2014  WEIGHT 301 lb 301 lb 303 lb 4.8 oz  HEIGHT 5\' 2"  5\' 1"  5\' 1"   BMI 55.04 kg/m2 56.9 kg/m2 57.34 kg/m2    Current exercise per week 60 minutes.

## 2014-06-16 NOTE — Assessment & Plan Note (Signed)
The increased risk of cardiovascular disease associated with this diagnosis, and the need to consistently work on lifestyle to change this is discussed. Following  a  heart healthy diet ,commitment to 30 minutes of exercise at least 5 days per week, as well as control of blood sugar and cholesterol , and achieving a healthy weight are all the areas to be addressed .  

## 2014-06-17 NOTE — Progress Notes (Signed)
EKG 01-17-12 EPIC CHEST XRAY 10-31-13 EPIC

## 2014-06-17 NOTE — Patient Instructions (Addendum)
Stephanie Cannon  06/17/2014   Your procedure is scheduled on: 06-23-14  Report to [facility] Main  Entrance and follow signs to               [place] at 515 AM.  Call this number if you have problems the morning of surgery [phone]   Remember: ONLY 1 PERSON MAY GO WITH YOU TO SHORT STAY TO GET  READY MORNING OF [place].  Do not eat food or drink liquids :After Midnight.     Take these medicines the morning of surgery with A SIP OF WATER: Nexium                               You may not have any metal on your body including hair pins and              piercings  Do not wear jewelry, make-up, lotions, powders or perfumes, deodorant             Do not wear nail polish.  Do not shave  48 hours prior to surgery.              Men may shave face and neck.   Do not bring valuables to the hospital. [place].  Contacts, dentures or bridgework may not be worn into surgery.  Leave suitcase in the car. After surgery it may be brought to your room.                [facility] - Preparing for Surgery Before surgery, you can play an important role.  Because skin is not sterile, your skin needs to be as free of germs as possible.  You can reduce the number of germs on your skin by washing with CHG (chlorahexidine gluconate) soap before surgery.  CHG is an antiseptic cleaner which kills germs and bonds with the skin to continue killing germs even after washing. Please DO NOT use if you have an allergy to CHG or antibacterial soaps.  If your skin becomes reddened/irritated stop using the CHG and inform your nurse when you arrive at Short Stay. Do not shave (including legs and underarms) for at least 48 hours prior to the first CHG shower.  You may shave your face/neck. Please follow these instructions carefully:  1.  Shower with CHG Soap the night before surgery and the  morning of Surgery.  2.  If you choose to  wash your hair, wash your hair first as usual with your  normal  shampoo.  3.  After you shampoo, rinse your hair and body thoroughly to remove the  shampoo.                           4.  Use CHG as you would any other liquid soap.  You can apply chg directly  to the skin and wash                       Gently with a scrungie or clean washcloth.  5.  Apply the CHG Soap to your body ONLY FROM THE NECK DOWN.   Do not use on face/ open  Wound or open sores. Avoid contact with eyes, ears mouth and genitals (private parts).                       Wash face,  Genitals (private parts) with your normal soap.             6.  Wash thoroughly, paying special attention to the area where your surgery  will be performed.  7.  Thoroughly rinse your body with warm water from the neck down.  8.  DO NOT shower/wash with your normal soap after using and rinsing off  the CHG Soap.                9.  Pat yourself dry with a clean towel.            10.  Wear clean pajamas.            11.  Place clean sheets on your bed the night of your first shower and do not  sleep with pets. Day of Surgery : Do not apply any lotions/deodorants the morning of surgery.  Please wear clean clothes to the hospital/surgery center.  FAILURE TO FOLLOW THESE INSTRUCTIONS MAY RESULT IN THE CANCELLATION OF YOUR SURGERY PATIENT SIGNATURE_________________________________  NURSE SIGNATURE__________________________________  ________________________________________________________________________

## 2014-06-18 ENCOUNTER — Encounter (HOSPITAL_COMMUNITY)
Admission: RE | Admit: 2014-06-18 | Discharge: 2014-06-18 | Disposition: A | Discharge status: Home / Self Care | Payer: BC Managed Care – PPO | Source: Ambulatory Visit | Attending: Surgery | Admitting: Surgery

## 2014-06-18 ENCOUNTER — Encounter (HOSPITAL_COMMUNITY): Payer: Self-pay

## 2014-06-18 DIAGNOSIS — Z01818 Encounter for other preprocedural examination: Secondary | ICD-10-CM | POA: Insufficient documentation

## 2014-06-18 LAB — COMPREHENSIVE METABOLIC PANEL
ALBUMIN: 4.4 g/dL (ref 3.5–5.0)
ALT: 29 U/L (ref 14–54)
ANION GAP: 17 — AB (ref 5–15)
AST: 33 U/L (ref 15–41)
Alkaline Phosphatase: 82 U/L (ref 38–126)
BILIRUBIN TOTAL: 0.5 mg/dL (ref 0.3–1.2)
BUN: 16 mg/dL (ref 6–20)
CHLORIDE: 95 mmol/L — AB (ref 101–111)
CO2: 29 mmol/L (ref 22–32)
Calcium: 10.5 mg/dL — ABNORMAL HIGH (ref 8.9–10.3)
Creatinine, Ser: 0.82 mg/dL (ref 0.44–1.00)
GFR calc non Af Amer: >60 mL/min (ref >60)
GLUCOSE: 86 mg/dL (ref 65–99)
Potassium: 3.7 mmol/L (ref 3.5–5.1)
Sodium: 141 mmol/L (ref 135–145)
Total Protein: 8.4 g/dL — ABNORMAL HIGH (ref 6.5–8.1)

## 2014-06-18 LAB — CBC WITH DIFFERENTIAL/PLATELET
BASOS ABS: 0 10*3/uL (ref 0.0–0.1)
BASOS PCT: 0 % (ref 0–1)
Eosinophils Absolute: 0.1 10*3/uL (ref 0.0–0.7)
Eosinophils Relative: 1 % (ref 0–5)
HCT: 43.8 % (ref 36.0–46.0)
Hemoglobin: 14.5 g/dL (ref 12.0–15.0)
LYMPHS PCT: 28 % (ref 12–46)
Lymphs Abs: 2.2 10*3/uL (ref 0.7–4.0)
MCH: 28.2 pg (ref 26.0–34.0)
MCHC: 33.1 g/dL (ref 30.0–36.0)
MCV: 85.2 fL (ref 78.0–100.0)
Monocytes Absolute: 0.4 10*3/uL (ref 0.1–1.0)
Monocytes Relative: 5 % (ref 3–12)
NEUTROS ABS: 5.2 10*3/uL (ref 1.7–7.7)
NEUTROS PCT: 66 % (ref 43–77)
Platelets: 249 10*3/uL (ref 150–400)
RBC: 5.14 MIL/uL — ABNORMAL HIGH (ref 3.87–5.11)
RDW: 14.1 % (ref 11.5–15.5)
WBC: 7.9 10*3/uL (ref 4.0–10.5)

## 2014-06-22 ENCOUNTER — Encounter (HOSPITAL_COMMUNITY): Payer: Self-pay | Admitting: Anesthesiology
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
## 2014-06-22 NOTE — Anesthesia Preprocedure Evaluation (Addendum)
Anesthesia Evaluation  Patient identified by MRN, date of birth, ID band Patient awake    Reviewed: Allergy & Precautions, NPO status , Patient's Chart, lab work & pertinent test results  History of Anesthesia Complications (+) DIFFICULT AIRWAY  Airway Mallampati: II  TM Distance: >3 FB Neck ROM: Full    Dental no notable dental hx.    Pulmonary former smoker,  breath sounds clear to auscultation  Pulmonary exam normal       Cardiovascular Exercise Tolerance: Good hypertension, Pt. on medications Normal cardiovascular examRhythm:Regular Rate:Normal     Neuro/Psych Anxiety  Neuromuscular disease    GI/Hepatic Neg liver ROS, GERD-  Medicated,  Endo/Other  Morbid obesity  Renal/GU negative Renal ROS  negative genitourinary   Musculoskeletal  (+) Arthritis -,   Abdominal (+) + obese,   Peds negative pediatric ROS (+)  Hematology negative hematology ROS (+)   Anesthesia Other Findings   Reproductive/Obstetrics negative OB ROS                            Anesthesia Physical Anesthesia Plan  ASA: III  Anesthesia Plan: General   Post-op Pain Management:    Induction: Intravenous  Airway Management Planned: Oral ETT  Additional Equipment:   Intra-op Plan:   Post-operative Plan: Extubation in OR  Informed Consent: I have reviewed the patients History and Physical, chart, labs and discussed the procedure including the risks, benefits and alternatives for the proposed anesthesia with the patient or authorized representative who has indicated his/her understanding and acceptance.   Dental advisory given  Plan Discussed with: CRNA  Anesthesia Plan Comments: (Glidescope needed January 2016)        Anesthesia Quick Evaluation

## 2014-06-23 ENCOUNTER — Encounter (HOSPITAL_COMMUNITY)
Admission: RE | Disposition: A | Discharge status: Home / Self Care | Payer: Self-pay | Source: Ambulatory Visit | Attending: Surgery

## 2014-06-23 ENCOUNTER — Inpatient Hospital Stay (HOSPITAL_COMMUNITY)
Admission: RE | Admit: 2014-06-23 | Discharge: 2014-06-25 | DRG: 621 | Disposition: A | Discharge status: Home / Self Care | Payer: BC Managed Care – PPO | Source: Ambulatory Visit | Attending: Surgery | Admitting: Surgery

## 2014-06-23 ENCOUNTER — Inpatient Hospital Stay (HOSPITAL_COMMUNITY): Payer: BC Managed Care – PPO | Admitting: Anesthesiology

## 2014-06-23 ENCOUNTER — Encounter (HOSPITAL_COMMUNITY): Payer: Self-pay | Admitting: *Deleted

## 2014-06-23 DIAGNOSIS — R112 Nausea with vomiting, unspecified: Secondary | ICD-10-CM | POA: Diagnosis not present

## 2014-06-23 DIAGNOSIS — Z6841 Body Mass Index (BMI) 40.0 and over, adult: Secondary | ICD-10-CM

## 2014-06-23 DIAGNOSIS — Z903 Acquired absence of stomach [part of]: Secondary | ICD-10-CM

## 2014-06-23 DIAGNOSIS — G8929 Other chronic pain: Secondary | ICD-10-CM | POA: Diagnosis present

## 2014-06-23 DIAGNOSIS — Z01812 Encounter for preprocedural laboratory examination: Secondary | ICD-10-CM

## 2014-06-23 DIAGNOSIS — Z87891 Personal history of nicotine dependence: Secondary | ICD-10-CM

## 2014-06-23 DIAGNOSIS — Z79899 Other long term (current) drug therapy: Secondary | ICD-10-CM

## 2014-06-23 DIAGNOSIS — K660 Peritoneal adhesions (postprocedural) (postinfection): Secondary | ICD-10-CM | POA: Diagnosis present

## 2014-06-23 DIAGNOSIS — I10 Essential (primary) hypertension: Secondary | ICD-10-CM | POA: Diagnosis present

## 2014-06-23 DIAGNOSIS — K449 Diaphragmatic hernia without obstruction or gangrene: Secondary | ICD-10-CM | POA: Diagnosis present

## 2014-06-23 DIAGNOSIS — M549 Dorsalgia, unspecified: Secondary | ICD-10-CM | POA: Diagnosis present

## 2014-06-23 DIAGNOSIS — K219 Gastro-esophageal reflux disease without esophagitis: Secondary | ICD-10-CM | POA: Diagnosis present

## 2014-06-23 HISTORY — PX: LAPAROSCOPIC LYSIS OF ADHESIONS: SHX5905

## 2014-06-23 HISTORY — PX: LAPAROSCOPIC GASTRIC SLEEVE RESECTION WITH HIATAL HERNIA REPAIR: SHX6512

## 2014-06-23 HISTORY — PX: UPPER GI ENDOSCOPY: SHX6162

## 2014-06-23 LAB — HEMOGLOBIN AND HEMATOCRIT, BLOOD
HCT: 43.4 % (ref 36.0–46.0)
HEMOGLOBIN: 13.9 g/dL (ref 12.0–15.0)

## 2014-06-23 SURGERY — GASTRECTOMY, SLEEVE, LAPAROSCOPIC, WITH HIATAL HERNIA REPAIR
Anesthesia: General

## 2014-06-23 MED ORDER — FENTANYL CITRATE (PF) 100 MCG/2ML IJ SOLN
INTRAMUSCULAR | Status: DC | PRN
  Administered 2014-06-23 (×3): 50 ug via INTRAVENOUS
  Administered 2014-06-23: 100 ug via INTRAVENOUS

## 2014-06-23 MED ORDER — TISSEEL VH 10 ML EX KIT
PACK | CUTANEOUS | Status: AC
  Filled 2014-06-23: qty 2

## 2014-06-23 MED ORDER — PROPOFOL 10 MG/ML IV BOLUS
INTRAVENOUS | Status: DC | PRN
  Administered 2014-06-23: 200 mg via INTRAVENOUS

## 2014-06-23 MED ORDER — HYDROMORPHONE HCL 1 MG/ML IJ SOLN
INTRAMUSCULAR | Status: AC
  Filled 2014-06-23: qty 1

## 2014-06-23 MED ORDER — NEOSTIGMINE METHYLSULFATE 10 MG/10ML IV SOLN
INTRAVENOUS | Status: AC
  Filled 2014-06-23: qty 1

## 2014-06-23 MED ORDER — HEPARIN SODIUM (PORCINE) 5000 UNIT/ML IJ SOLN
5000.0000 [IU] | Freq: Three times a day (TID) | INTRAMUSCULAR | Status: DC
  Administered 2014-06-23 – 2014-06-25 (×5): 5000 [IU] via SUBCUTANEOUS
  Filled 2014-06-23 (×8): qty 1

## 2014-06-23 MED ORDER — SUCCINYLCHOLINE CHLORIDE 20 MG/ML IJ SOLN
INTRAMUSCULAR | Status: DC | PRN
  Administered 2014-06-23: 160 mg via INTRAVENOUS

## 2014-06-23 MED ORDER — MIDAZOLAM HCL 2 MG/2ML IJ SOLN
INTRAMUSCULAR | Status: AC
  Filled 2014-06-23: qty 2

## 2014-06-23 MED ORDER — PROPOFOL 10 MG/ML IV BOLUS
INTRAVENOUS | Status: AC
  Filled 2014-06-23: qty 20

## 2014-06-23 MED ORDER — LIDOCAINE HCL (CARDIAC) 20 MG/ML IV SOLN
INTRAVENOUS | Status: AC
  Filled 2014-06-23: qty 5

## 2014-06-23 MED ORDER — UNJURY VANILLA POWDER: 2.0000 [oz_av] | Freq: Four times a day (QID) | ORAL | Status: DC

## 2014-06-23 MED ORDER — OXYCODONE HCL 5 MG/5ML PO SOLN
5.0000 mg | ORAL | Status: DC | PRN
  Administered 2014-06-24 (×3): 5 mg via ORAL
  Filled 2014-06-23 (×4): qty 5

## 2014-06-23 MED ORDER — BUPIVACAINE-EPINEPHRINE 0.25% -1:200000 IJ SOLN
INTRAMUSCULAR | Status: AC
  Filled 2014-06-23: qty 1

## 2014-06-23 MED ORDER — ROCURONIUM BROMIDE 100 MG/10ML IV SOLN
INTRAVENOUS | Status: DC | PRN
  Administered 2014-06-23 (×2): 10 mg via INTRAVENOUS
  Administered 2014-06-23: 50 mg via INTRAVENOUS
  Administered 2014-06-23: 10 mg via INTRAVENOUS
  Administered 2014-06-23: 5 mg via INTRAVENOUS

## 2014-06-23 MED ORDER — CEFOTETAN DISODIUM-DEXTROSE 2-2.08 GM-% IV SOLR
2.0000 g | INTRAVENOUS | Status: AC
  Administered 2014-06-23: 2 g via INTRAVENOUS

## 2014-06-23 MED ORDER — CHLORHEXIDINE GLUCONATE 4 % EX LIQD: 60.0000 mL | Freq: Once | CUTANEOUS | Status: DC

## 2014-06-23 MED ORDER — ACETAMINOPHEN 160 MG/5ML PO SOLN: 650.0000 mg | ORAL | Status: DC | PRN

## 2014-06-23 MED ORDER — ROCURONIUM BROMIDE 100 MG/10ML IV SOLN
INTRAVENOUS | Status: AC
  Filled 2014-06-23: qty 1

## 2014-06-23 MED ORDER — HYDROMORPHONE HCL 2 MG/ML IJ SOLN
INTRAMUSCULAR | Status: AC
  Filled 2014-06-23: qty 1

## 2014-06-23 MED ORDER — ONDANSETRON HCL 4 MG/2ML IJ SOLN
INTRAMUSCULAR | Status: DC | PRN
  Administered 2014-06-23: 4 mg via INTRAVENOUS

## 2014-06-23 MED ORDER — BUPIVACAINE-EPINEPHRINE 0.25% -1:200000 IJ SOLN
INTRAMUSCULAR | Status: DC | PRN
Start: 2014-06-23 — End: 2014-06-23
  Administered 2014-06-23: 30 mL

## 2014-06-23 MED ORDER — GLYCOPYRROLATE 0.2 MG/ML IJ SOLN
INTRAMUSCULAR | Status: DC | PRN
  Administered 2014-06-23: .8 mg via INTRAVENOUS

## 2014-06-23 MED ORDER — LIDOCAINE HCL (CARDIAC) 20 MG/ML IV SOLN
INTRAVENOUS | Status: DC | PRN
  Administered 2014-06-23: 50 mg via INTRAVENOUS

## 2014-06-23 MED ORDER — PROMETHAZINE HCL 25 MG/ML IJ SOLN
INTRAMUSCULAR | Status: AC
  Filled 2014-06-23: qty 1

## 2014-06-23 MED ORDER — LIP MEDEX EX OINT
TOPICAL_OINTMENT | CUTANEOUS | Status: AC
  Filled 2014-06-23: qty 7

## 2014-06-23 MED ORDER — NEOSTIGMINE METHYLSULFATE 10 MG/10ML IV SOLN
INTRAVENOUS | Status: DC | PRN
  Administered 2014-06-23: 5 mg via INTRAVENOUS

## 2014-06-23 MED ORDER — 0.9 % SODIUM CHLORIDE (POUR BTL) OPTIME
TOPICAL | Status: DC | PRN
  Administered 2014-06-23: 1000 mL

## 2014-06-23 MED ORDER — HEPARIN SODIUM (PORCINE) 5000 UNIT/ML IJ SOLN
5000.0000 [IU] | INTRAMUSCULAR | Status: AC
  Administered 2014-06-23: 5000 [IU] via SUBCUTANEOUS
  Filled 2014-06-23: qty 1

## 2014-06-23 MED ORDER — ONDANSETRON HCL 4 MG/2ML IJ SOLN
INTRAMUSCULAR | Status: AC
  Filled 2014-06-23: qty 2

## 2014-06-23 MED ORDER — TISSEEL VH 10 ML EX KIT
PACK | CUTANEOUS | Status: DC | PRN
  Administered 2014-06-23: 2

## 2014-06-23 MED ORDER — HYDROMORPHONE HCL 1 MG/ML IJ SOLN
INTRAMUSCULAR | Status: DC | PRN
  Administered 2014-06-23 (×4): 0.5 mg via INTRAVENOUS

## 2014-06-23 MED ORDER — LIDOCAINE HCL 1 % IJ SOLN
INTRAMUSCULAR | Status: AC
  Filled 2014-06-23: qty 20

## 2014-06-23 MED ORDER — HYDROMORPHONE HCL 1 MG/ML IJ SOLN
0.5000 mg | INTRAMUSCULAR | Status: DC | PRN
  Administered 2014-06-23 (×3): 1 mg via INTRAVENOUS
  Administered 2014-06-23: 0.5 mg via INTRAVENOUS
  Administered 2014-06-23 – 2014-06-24 (×3): 1 mg via INTRAVENOUS
  Filled 2014-06-23 (×6): qty 1

## 2014-06-23 MED ORDER — LABETALOL HCL 5 MG/ML IV SOLN
INTRAVENOUS | Status: DC | PRN
  Administered 2014-06-23: 2.5 mg via INTRAVENOUS

## 2014-06-23 MED ORDER — ONDANSETRON HCL 4 MG/2ML IJ SOLN
4.0000 mg | INTRAMUSCULAR | Status: DC | PRN
  Administered 2014-06-24 (×3): 4 mg via INTRAVENOUS
  Filled 2014-06-23 (×3): qty 2

## 2014-06-23 MED ORDER — DEXAMETHASONE SODIUM PHOSPHATE 10 MG/ML IJ SOLN
INTRAMUSCULAR | Status: DC | PRN
  Administered 2014-06-23: 10 mg via INTRAVENOUS

## 2014-06-23 MED ORDER — ARTIFICIAL TEARS OP OINT
TOPICAL_OINTMENT | OPHTHALMIC | Status: AC
  Filled 2014-06-23: qty 3.5

## 2014-06-23 MED ORDER — SODIUM CHLORIDE 0.9 % IJ SOLN
INTRAMUSCULAR | Status: AC
  Filled 2014-06-23: qty 10

## 2014-06-23 MED ORDER — LIDOCAINE HCL 1 % IJ SOLN
INTRAMUSCULAR | Status: AC
  Filled 2014-06-23: qty 40

## 2014-06-23 MED ORDER — DEXAMETHASONE SODIUM PHOSPHATE 10 MG/ML IJ SOLN
INTRAMUSCULAR | Status: AC
  Filled 2014-06-23: qty 1

## 2014-06-23 MED ORDER — LABETALOL HCL 5 MG/ML IV SOLN
INTRAVENOUS | Status: AC
  Filled 2014-06-23: qty 4

## 2014-06-23 MED ORDER — EPHEDRINE SULFATE 50 MG/ML IJ SOLN
INTRAMUSCULAR | Status: AC
  Filled 2014-06-23: qty 1

## 2014-06-23 MED ORDER — PROMETHAZINE HCL 25 MG/ML IJ SOLN
6.2500 mg | INTRAMUSCULAR | Status: DC | PRN
  Administered 2014-06-23: 6.25 mg via INTRAVENOUS

## 2014-06-23 MED ORDER — LACTATED RINGERS IV SOLN
INTRAVENOUS | Status: DC | PRN
  Administered 2014-06-23 (×2): via INTRAVENOUS

## 2014-06-23 MED ORDER — LACTATED RINGERS IR SOLN
Status: DC | PRN
  Administered 2014-06-23: 1000 mL

## 2014-06-23 MED ORDER — MIDAZOLAM HCL 5 MG/5ML IJ SOLN
INTRAMUSCULAR | Status: DC | PRN
  Administered 2014-06-23: 2 mg via INTRAVENOUS

## 2014-06-23 MED ORDER — UNJURY CHOCOLATE CLASSIC POWDER
2.0000 [oz_av] | Freq: Four times a day (QID) | ORAL | Status: DC
  Administered 2014-06-25 (×2): 2 [oz_av] via ORAL

## 2014-06-23 MED ORDER — CEFOTETAN DISODIUM-DEXTROSE 2-2.08 GM-% IV SOLR
INTRAVENOUS | Status: AC
  Filled 2014-06-23: qty 50

## 2014-06-23 MED ORDER — UNJURY CHICKEN SOUP POWDER: 2.0000 [oz_av] | Freq: Four times a day (QID) | ORAL | Status: DC

## 2014-06-23 MED ORDER — HYDROMORPHONE HCL 1 MG/ML IJ SOLN
0.2500 mg | INTRAMUSCULAR | Status: DC | PRN
  Administered 2014-06-23 (×3): 0.5 mg via INTRAVENOUS

## 2014-06-23 MED ORDER — GLYCOPYRROLATE 0.2 MG/ML IJ SOLN
INTRAMUSCULAR | Status: AC
  Filled 2014-06-23: qty 2

## 2014-06-23 MED ORDER — ACETAMINOPHEN 160 MG/5ML PO SOLN: 325.0000 mg | ORAL | Status: DC | PRN

## 2014-06-23 MED ORDER — LIP MEDEX EX OINT: TOPICAL_OINTMENT | CUTANEOUS | Status: DC | PRN

## 2014-06-23 MED ORDER — KCL IN DEXTROSE-NACL 20-5-0.45 MEQ/L-%-% IV SOLN
INTRAVENOUS | Status: DC
  Administered 2014-06-23 – 2014-06-25 (×6): via INTRAVENOUS
  Filled 2014-06-23 (×7): qty 1000

## 2014-06-23 MED ORDER — FENTANYL CITRATE (PF) 250 MCG/5ML IJ SOLN
INTRAMUSCULAR | Status: AC
  Filled 2014-06-23: qty 5

## 2014-06-23 SURGICAL SUPPLY — 62 items
APL SRG 32X5 SNPLK LF DISP (MISCELLANEOUS) ×2
APPLICATOR COTTON TIP 6IN STRL (MISCELLANEOUS) IMPLANT
APPLIER CLIP ROT 10 11.4 M/L (STAPLE)
APPLIER CLIP ROT 13.4 12 LRG (CLIP)
APR CLP LRG 13.4X12 ROT 20 MLT (CLIP)
APR CLP MED LRG 11.4X10 (STAPLE)
BLADE SURG 15 STRL LF DISP TIS (BLADE) ×2 IMPLANT
BLADE SURG 15 STRL SS (BLADE) ×3
CABLE HIGH FREQUENCY MONO STRZ (ELECTRODE) ×1 IMPLANT
CHLORAPREP W/TINT 26ML (MISCELLANEOUS) ×4 IMPLANT
CLIP APPLIE ROT 10 11.4 M/L (STAPLE) IMPLANT
CLIP APPLIE ROT 13.4 12 LRG (CLIP) IMPLANT
DEVICE SUTURE ENDOST 10MM (ENDOMECHANICALS) IMPLANT
DEVICE TROCAR PUNCTURE CLOSURE (ENDOMECHANICALS) IMPLANT
DISSECTOR BLUNT TIP ENDO 5MM (MISCELLANEOUS) ×2 IMPLANT
DRAPE CAMERA CLOSED 9X96 (DRAPES) ×3 IMPLANT
DRAPE UTILITY XL STRL (DRAPES) ×6 IMPLANT
ELECT REM PT RETURN 9FT ADLT (ELECTROSURGICAL) ×3
ELECTRODE REM PT RTRN 9FT ADLT (ELECTROSURGICAL) ×2 IMPLANT
GAUZE SPONGE 4X4 12PLY STRL (GAUZE/BANDAGES/DRESSINGS) IMPLANT
GLOVE SURG SIGNA 7.5 PF LTX (GLOVE) ×3 IMPLANT
GOWN STRL REUS W/TWL XL LVL3 (GOWN DISPOSABLE) ×10 IMPLANT
HOVERMATT SINGLE USE (MISCELLANEOUS) ×3 IMPLANT
KIT BASIN OR (CUSTOM PROCEDURE TRAY) ×3 IMPLANT
LIQUID BAND (GAUZE/BANDAGES/DRESSINGS) ×3 IMPLANT
NDL SPNL 22GX3.5 QUINCKE BK (NEEDLE) ×2 IMPLANT
NEEDLE SPNL 22GX3.5 QUINCKE BK (NEEDLE) ×3 IMPLANT
PACK UNIVERSAL I (CUSTOM PROCEDURE TRAY) ×3 IMPLANT
PEN SKIN MARKING BROAD (MISCELLANEOUS) ×3 IMPLANT
RELOAD STAPLE 60 3.6 BLU REG (STAPLE) IMPLANT
RELOAD STAPLE 60 3.8 GOLD REG (STAPLE) IMPLANT
RELOAD STAPLE 60 4.1 GRN THCK (STAPLE) ×4 IMPLANT
RELOAD STAPLER BLUE 60MM (STAPLE) ×4 IMPLANT
RELOAD STAPLER GOLD 60MM (STAPLE) ×4 IMPLANT
RELOAD STAPLER GREEN 60MM (STAPLE) ×4 IMPLANT
SCISSORS LAP 5X35 DISP (ENDOMECHANICALS) ×1 IMPLANT
SEALANT SURGICAL APPL DUAL CAN (MISCELLANEOUS) ×3 IMPLANT
SET IRRIG TUBING LAPAROSCOPIC (IRRIGATION / IRRIGATOR) ×3 IMPLANT
SHEARS CURVED HARMONIC AC 45CM (MISCELLANEOUS) ×3 IMPLANT
SLEEVE ADV FIXATION 5X100MM (TROCAR) ×3 IMPLANT
SLEEVE GASTRECTOMY 36FR VISIGI (MISCELLANEOUS) ×3 IMPLANT
SOLUTION ANTI FOG 6CC (MISCELLANEOUS) ×3 IMPLANT
SPONGE LAP 18X18 X RAY DECT (DISPOSABLE) ×3 IMPLANT
STAPLER ECHELON LONG 60 440 (INSTRUMENTS) ×1 IMPLANT
STAPLER RELOAD BLUE 60MM (STAPLE) ×6
STAPLER RELOAD GOLD 60MM (STAPLE) ×6
STAPLER RELOAD GREEN 60MM (STAPLE) ×6
SUT ETHILON 2 0 PS N (SUTURE) IMPLANT
SUT MNCRL AB 4-0 PS2 18 (SUTURE) ×4 IMPLANT
SYR 20CC LL (SYRINGE) ×3 IMPLANT
TOWEL OR 17X26 10 PK STRL BLUE (TOWEL DISPOSABLE) ×3 IMPLANT
TOWEL OR NON WOVEN STRL DISP B (DISPOSABLE) ×3 IMPLANT
TRAY FOLEY W/METER SILVER 14FR (SET/KITS/TRAYS/PACK) ×2 IMPLANT
TROCAR ADV FIXATION 12X100MM (TROCAR) ×3 IMPLANT
TROCAR ADV FIXATION 5X100MM (TROCAR) ×3 IMPLANT
TROCAR BLADELESS 15MM (ENDOMECHANICALS) ×3 IMPLANT
TROCAR BLADELESS OPT 5 100 (ENDOMECHANICALS) ×3 IMPLANT
TROCAR XCEL 12X100 BLDLESS (ENDOMECHANICALS) IMPLANT
TUBE CALIBRATION LAPBAND (TUBING) ×1 IMPLANT
TUBING CONNECTING 10 (TUBING) ×3 IMPLANT
TUBING ENDO SMARTCAP PENTAX (MISCELLANEOUS) ×3 IMPLANT
TUBING FILTER THERMOFLATOR (ELECTROSURGICAL) ×3 IMPLANT

## 2014-06-23 NOTE — Anesthesia Procedure Notes (Signed)
Procedure Name: Intubation Date/Time: 06/23/2014 7:25 AM Performed by: Luian Schumpert, Virgel Gess Pre-anesthesia Checklist: Patient identified, Emergency Drugs available, Suction available, Patient being monitored and Timeout performed Patient Re-evaluated:Patient Re-evaluated prior to inductionOxygen Delivery Method: Circle system utilized Preoxygenation: Pre-oxygenation with 100% oxygen Intubation Type: IV induction Ventilation: Mask ventilation without difficulty Laryngoscope Size: Mac and 3 Grade View: Grade III Tube type: Oral Tube size: 7.5 mm Number of attempts: 2 Airway Equipment and Method: Stylet and Video-laryngoscopy Placement Confirmation: ETT inserted through vocal cords under direct vision,  positive ETCO2,  CO2 detector and breath sounds checked- equal and bilateral Secured at: 22 cm Tube secured with: Tape Dental Injury: Teeth and Oropharynx as per pre-operative assessment  Difficulty Due To: Difficulty was anticipated, Difficult Airway- due to large tongue, Difficult Airway- due to dentition, Difficult Airway- due to limited oral opening and Difficult Airway- due to anterior larynx Future Recommendations: Recommend- induction with short-acting agent, and alternative techniques readily available Comments: Easy mask AW, Grade 3 with Mac 3, Grade 2 with glidescope, easy pass of ETT.

## 2014-06-23 NOTE — Transfer of Care (Signed)
Immediate Anesthesia Transfer of Care Note  Patient: Stephanie Cannon  Procedure(s) Performed: Procedure(s): LAPAROSCOPIC LYSIS OF ADHESIONS, GASTRIC SLEEVE RESECTION AND UPPER ENDO (N/A) LAPAROSCOPIC LYSIS OF ADHESIONS UPPER GI ENDOSCOPY  Patient Location: PACU  Anesthesia Type:General  Level of Consciousness:  sedated, patient cooperative and responds to stimulation  Airway & Oxygen Therapy:Patient Spontanous Breathing and Patient connected to face mask oxgen  Post-op Assessment:  Report given to PACU RN and Post -op Vital signs reviewed and stable  Post vital signs:  Reviewed and stable  Last Vitals:  Filed Vitals:   06/23/14 0521  BP: 104/61  Pulse: 96  Temp: 36.5 C  Resp: 18    Complications: No apparent anesthesia complications

## 2014-06-23 NOTE — Op Note (Signed)
Procedure: Upper GI endoscopy  Description of procedure: Upper GI endoscopy is performed at the completion of laparoscopic sleeve gastrectomy by Dr.  Lucia Gaskins  The video endoscope was introduced into the upper esophagus and then passed to the EG junction at about 40 cm. The esophagus appeared normal. The gastric sleeve was entered. The sleeve was tensely distended with air while the outlet was obstructed under saline irrigation by the operating surgeon. There was no evidence of leak. The staple line was intact and without bleeding. The scope was advanced to the antrum and pylorus visualized. There was no stricture or twisting or mucosal abnormality, and particularly no narrowing noted at the incisura.  The pouch was then desufflated and the scope withdrawn.  Edward Jolly MD, FACS  06/23/2014, 9:33 AM

## 2014-06-23 NOTE — Op Note (Signed)
PATIENT:   Stephanie Cannon DOB:   1970-09-28 MRN:   [id number]  DATE OF PROCEDURE: 06/23/2014                   FACILITY:  [facility]  OPERATIVE REPORT  PREOPERATIVE DIAGNOSIS:  Morbid obesity.  POSTOPERATIVE DIAGNOSIS:  Morbid obesity (weight 299, BMI of 56.5), Intraabdominal adhesions  PROCEDURE:  Laparoscopic Sleeve gastrectomy (intraoperative upper endoscopy by Dr. Excell Seltzer), 45 minutes of enterolysis of adhesions  SURGEON:  Fenton Malling. Lucia Gaskins, MD  FIRST ASSISTANT:  B. Hoxworth, MD  ANESTHESIA:  General endotracheal.  Anesthesiologist: Franne Grip, MD CRNA: Lavina Hamman, CRNA; Maxwell Caul, CRNA  General  ESTIMATED BLOOD LOSS:  Minimal.  LOCAL ANESTHESIA:  30 cc of 6/6% Marcaine  COMPLICATIONS:  None.  INDICATION FOR SURGERY:  Stephanie Cannon is a 44 y.o. AA  female who sees [name], MD as her primary care doctor.  She has completed our preoperative bariatric program and now comes for a laparoscopic Sleeve gastrectomy.  She was noted to have a small HH on UGI.  We will check for this at the time of surgery.  The indications, potential complications of surgery were explained to the patient.  Potential complications of the surgery include, but are not limited to, bleeding, infection, DVT, open surgery, and long-term nutritional consequences.  OPERATIVE NOTE:  The patient taken to room #11 at [facility] where Stephanie Cannon underwent a general endotracheal anesthetic, supervised by Anesthesiologist: Franne Grip, MD CRNA: Lavina Hamman, CRNA; Maxwell Caul, CRNA.  The patient was given 2 g of cefoxitin at the beginning of the procedure.  A time-out was held and surgical checklist run.  I accessed her abdominal cavity through the left upper quadrant with a 12 mm Optiview. I did an abdominal exploration.  She had extensive adhesions from her prior abdominal surgery.  I had to place an extra 5 mm trocar in the right lower abdomen to assist with enterolysis.  I spent 45 minutes  doing enterolysis.  The right and left lobes of the liver are unremarkable.  Her stomach was unremarkable.   I placed a total of 7 trocars. I placed a 5 mm left lateral trocar, a 5 mm left paramedian trocar (for the scope), a 12 mm right paramedian torcar, a 5 mm right subcostal trocar that I converted to a 15 mm to extract the stomach and 5 mm subxiphoid trocar for the liver retractor.  After doing the enterolysis, I was able toI start out taking down the greater curvature attachments of the stomach. I measured approximately 6 cm proximal from the pylorus and mobilized the greater curvature of the stomach with the Harmonic Scalpel. I took this dissection cranially around the greater curvature of her stomach to the angle of His and the left crus.   At the beginning I passed the sizing tube and insuflated the balloon with 15 cc and 10 cc and withdrew the tube, but the balloon did not go through the hiatus.  So I did nothing further to her GE junction.  After I had mobilized the greater curvature of the stomach, I then passed the 36 French ViSiGi bougie which was used to suck the stomach up against the lesser curvature and placed into the antrum. During the staple firing,  I tried to give the Callaway a cuff at least about 1 cm. I tried to avoid narrowing the incisura. I used a total of 6 staple firings.  From antrum to the angle of  His I used 2 green, 2 gold and 2 blue Eschelon 60 mm Ethicon staplers.  At each firing of the EndoGIA stapler, I inspected the stomach, anterior wall of the stomach, and underneath to make sure there was no compromise or impingement on to the ViSiGi bougie.   The staple line seemed linear without any corkscrewing of the stomach. Hemostasis was good. I did not use any reinforcement. She did have at least 5 areas of bleeding which I used clips to clip on the new greater curvature of the stomach.  Because I thought we had a good staple line, I then had the ViSiGi was converted to  insufflate the pouch. A new stomach pouch was placed under water. There was no bubbling or leak noted.   At this point, Dr. Excell Seltzer broke scrub and passed an upper endoscope down through the esophagus into the stomach pouch.  There were some food particles in both the esophagus and remnant stomach.   The stomach was tubular. There was no narrowing of the stomach pouch or angulation. We were easily able to pass the endoscope into the antrum and again put air pressure on the staple line. I irrigated the upper abdomen with saline. There was no bubbling or evidence of air leak. The mucosa looked viable. Dr. Excell Seltzer decompressed the stomach with the endoscopy.   I converted to right subcostal trocar to a 15 trocar and extracted the stomach remnant through this intact and sent this to Pathology. I then placed 10 cc of Tisseel along the new greater curvature staple line and covered the entire staple line with the Tisseel.  I aspirated out the saline that I had irrigated because I thought the staple line looked viable and complete. There was no evidence of leak. I did not leave a drain in place.   Then, I closed the trocar sites. I placed 2-0 Vicryl sutures at the 15-mm port site in the right upper quadrant. The other port sites seemed smaller not requiring sutures. I closed the skin at each site with a 4-0 Monocryl, painted each wound with LiquiBand.   The patient was transported to recovery room in good condition. Sponge and needle count were correct at the end of the case.    Alphonsa Overall, MD, [facility] Pager: [phone] Office phone:  [phone]

## 2014-06-23 NOTE — Progress Notes (Signed)
Update given to Dr. Delma Post regarding pt's stable numbers with the SAO2 and CO2 monitoring; OK for pt to go to floor for continued monitoring

## 2014-06-23 NOTE — Anesthesia Postprocedure Evaluation (Signed)
  Anesthesia Post-op Note  Patient: Stephanie Cannon  Procedure(s) Performed: Procedure(s) (LRB): LAPAROSCOPIC LYSIS OF ADHESIONS, GASTRIC SLEEVE RESECTION AND UPPER ENDO (N/A) LAPAROSCOPIC LYSIS OF ADHESIONS UPPER GI ENDOSCOPY  Patient Location: PACU  Anesthesia Type: General  Level of Consciousness: awake and alert   Airway and Oxygen Therapy: Patient Spontanous Breathing  Post-op Pain: mild  Post-op Assessment: Post-op Vital signs reviewed, Patient's Cardiovascular Status Stable, Respiratory Function Stable, Patent Airway and No signs of Nausea or vomiting  Last Vitals:  Filed Vitals:   06/23/14 1249  BP: 152/86  Pulse: 99  Temp: 36.4 C  Resp: 13    Post-op Vital Signs: stable   Complications: No apparent anesthesia complications. OSA precautions ordered.

## 2014-06-24 ENCOUNTER — Inpatient Hospital Stay (HOSPITAL_COMMUNITY): Payer: BC Managed Care – PPO

## 2014-06-24 ENCOUNTER — Encounter (HOSPITAL_COMMUNITY): Payer: Self-pay | Admitting: Surgery

## 2014-06-24 LAB — CBC WITH DIFFERENTIAL/PLATELET
Basophils Absolute: 0 10*3/uL (ref 0.0–0.1)
Basophils Relative: 0 % (ref 0–1)
EOS PCT: 0 % (ref 0–5)
Eosinophils Absolute: 0 10*3/uL (ref 0.0–0.7)
HCT: 40.7 % (ref 36.0–46.0)
Hemoglobin: 13.2 g/dL (ref 12.0–15.0)
LYMPHS ABS: 1.7 10*3/uL (ref 0.7–4.0)
Lymphocytes Relative: 16 % (ref 12–46)
MCH: 28.4 pg (ref 26.0–34.0)
MCHC: 32.4 g/dL (ref 30.0–36.0)
MCV: 87.7 fL (ref 78.0–100.0)
MONO ABS: 0.9 10*3/uL (ref 0.1–1.0)
Monocytes Relative: 8 % (ref 3–12)
Neutro Abs: 8.4 10*3/uL — ABNORMAL HIGH (ref 1.7–7.7)
Neutrophils Relative %: 76 % (ref 43–77)
Platelets: 254 10*3/uL (ref 150–400)
RBC: 4.64 MIL/uL (ref 3.87–5.11)
RDW: 14.8 % (ref 11.5–15.5)
WBC: 10.9 10*3/uL — ABNORMAL HIGH (ref 4.0–10.5)

## 2014-06-24 LAB — HEMOGLOBIN AND HEMATOCRIT, BLOOD
HEMATOCRIT: 41.6 % (ref 36.0–46.0)
HEMOGLOBIN: 13.2 g/dL (ref 12.0–15.0)

## 2014-06-24 MED ORDER — IOHEXOL 300 MG/ML  SOLN
50.0000 mL | Freq: Once | INTRAMUSCULAR | Status: AC | PRN
  Administered 2014-06-24: 50 mL via ORAL

## 2014-06-24 MED ORDER — PROMETHAZINE HCL 25 MG RE SUPP
25.0000 mg | Freq: Four times a day (QID) | RECTAL | Status: DC | PRN
  Administered 2014-06-24: 25 mg via RECTAL
  Filled 2014-06-24: qty 1

## 2014-06-24 MED ORDER — DIPHENHYDRAMINE HCL 12.5 MG/5ML PO ELIX
25.0000 mg | ORAL_SOLUTION | Freq: Three times a day (TID) | ORAL | Status: DC | PRN
  Administered 2014-06-24: 25 mg via ORAL
  Filled 2014-06-24: qty 10

## 2014-06-24 NOTE — Plan of Care (Signed)
Problem: Food- and Nutrition-Related Knowledge Deficit (NB-1.1) Goal: Nutrition education Formal process to instruct or train a patient/client in a skill or to impart knowledge to help patients/clients voluntarily manage or modify food choices and eating behavior to maintain or improve health. Outcome: Progressing RD consulted for nutrition education regarding gastric bypass diet.  Body mass index is 55.41 kg/(m^2). Pt meets criteria for obese based on current BMI.  RD provided "Gastric Bypass Post-op Diet" handout from the [facility]. Emphasized the importance of following diet parameters. Discussed importance of controlled and consistent intake throughout the day in specified amounts.Emphasized the importance of hydration with water and calorie-free beverages. Encouraged pt to discuss questions or concerns with inpatient dietitian or through follow-up with outpatient dietitian.   Expect good compliance.  Patient denies questions; questions re: diet soda from family are answered.   Current diet order is Bypass POD #1. Labs and medications reviewed. No further nutrition interventions warranted at this time. RD contact information provided. If additional nutrition issues arise, please re-consult RD.  Brynda Greathouse, MS RD LDN Clinical Inpatient Dietitian Weekend/After hours pager: [phone]

## 2014-06-24 NOTE — Progress Notes (Signed)
General Surgery Note  LOS: 1 day  POD -  1 Day Post-Op  Assessment/Plan: 1.  LAPAROSCOPIC LYSIS OF ADHESIONS, GASTRIC SLEEVE RESECTION AND UPPER ENDO - 06/23/2014 - D. Marsa Matteo  For morbid obesity - weight 299, BMI of 56.5  For UGI today.  2. She quit smoking around 2011. 3. HTN x 15 years 4. GERD - she takes Nexium  5. Chronic back pain.   She had back surgery 01/17/2014 by Dr. Arnoldo Morale.  6.  DVT prophylaxis - SQ Heparin   Active Problems:   Morbid obesity with body mass index of 50.0-59.9 in adult   Subjective:  Walked last night 4 times.  No nausea.  Doing well. Objective:   Filed Vitals:   06/24/14 0615  BP: 162/81  Pulse: 98  Temp: 98.3 F (36.8 C)  Resp: 16     Intake/Output from previous day:  06/20 0701 - 06/21 0700 In: 3792.5 [I.V.:3792.5] Out: 8366 [Urine:4725; Blood:100]  Intake/Output this shift:      Physical Exam:   General: Obese AA F who is alert and oriented.    HEENT: Normal. Pupils equal. .   Lungs: Clear   Abdomen: Soft   Wound: Okay   Lab Results:    Recent Labs  06/23/14 1720 06/24/14 0440  WBC  --  10.9*  HGB 13.9 13.2  HCT 43.4 40.7  PLT  --  254    BMET  No results for input(s): NA, K, CL, CO2, GLUCOSE, BUN, CREATININE, CALCIUM in the last 72 hours.  PT/INR  No results for input(s): LABPROT, INR in the last 72 hours.  ABG  No results for input(s): PHART, HCO3 in the last 72 hours.  Invalid input(s): PCO2, PO2   Studies/Results:  No results found.   Anti-infectives:   Anti-infectives    Start     Dose/Rate Route Frequency Ordered Stop   06/23/14 0550  cefoTEtan in Dextrose 5% (CEFOTAN) IVPB 2 g     2 g Intravenous On call to O.R. 06/23/14 0550 06/23/14 0730      Alphonsa Overall, MD, FACS Pager: [facility] Office: [phone] 06/24/2014

## 2014-06-24 NOTE — Progress Notes (Signed)
Patient alert and oriented, Post op day 1.  Provided support and encouragement.  Encouraged pulmonary toilet, ambulation and small sips of liquids when swallow study returned satisfactorily.  All questions answered.  Will continue to monitor. 

## 2014-06-24 NOTE — Progress Notes (Signed)
Paged MD Lucia Gaskins regarding patient with complaints of itching, telephone order given for benadryl 25 mg oral every 8 hours as needed, order entered Neta Mends RN 12:12 PM 06-24-2014

## 2014-06-25 LAB — CBC WITH DIFFERENTIAL/PLATELET
Basophils Absolute: 0 10*3/uL (ref 0.0–0.1)
Basophils Relative: 0 % (ref 0–1)
EOS ABS: 0.2 10*3/uL (ref 0.0–0.7)
Eosinophils Relative: 2 % (ref 0–5)
HCT: 41.6 % (ref 36.0–46.0)
HEMOGLOBIN: 13.2 g/dL (ref 12.0–15.0)
Lymphocytes Relative: 28 % (ref 12–46)
Lymphs Abs: 2.4 10*3/uL (ref 0.7–4.0)
MCH: 28 pg (ref 26.0–34.0)
MCHC: 31.7 g/dL (ref 30.0–36.0)
MCV: 88.1 fL (ref 78.0–100.0)
MONO ABS: 0.6 10*3/uL (ref 0.1–1.0)
MONOS PCT: 7 % (ref 3–12)
Neutro Abs: 5.5 10*3/uL (ref 1.7–7.7)
Neutrophils Relative %: 63 % (ref 43–77)
Platelets: 234 10*3/uL (ref 150–400)
RBC: 4.72 MIL/uL (ref 3.87–5.11)
RDW: 14.8 % (ref 11.5–15.5)
WBC: 8.7 10*3/uL (ref 4.0–10.5)

## 2014-06-25 NOTE — Discharge Instructions (Signed)
[facility] - DISCHARGE INSTRUCTIONS TO PATIENT  Activity:  Driving - May drive in 2 to 4 days, if doing well   Lifting - Take it easy for 7 days, then no limit  Wound Care:   May shower  Diet:  Post op sleeve diet  Follow up appointment:  Call Dr. Pollie Friar office [facility]) at [phone] for an appointment in 2 to 3 weeks.  Medications and dosages:  Resume your home medications.  You have a prescription for:  Roxicet elixir  Call Dr. Lucia Gaskins or his office  [phone]) if you have:  Temperature greater than 100.4,  Persistent nausea and vomiting,  Severe uncontrolled pain,  Redness, tenderness, or signs of infection (pain, swelling, redness, odor or green/yellow discharge around the site),  Difficulty breathing, headache or visual disturbances,  Any other questions or concerns you may have after discharge.  In an emergency, call 911 or go to an Emergency Department at a nearby hospital.       GASTRIC BYPASS/SLEEVE  Home Care Instructions   These instructions are to help you care for yourself when you go home.  Call: If you have any problems. Call [phone] and ask for the surgeon on call If you need immediate assistance come to the ER at [facility]. Tell the ER staff you are a new post-op gastric bypass or gastric sleeve patient  Signs and symptoms to report: Severe  vomiting or nausea If you cannot handle clear liquids for longer than 1 day, call your surgeon Abdominal pain which does not get better after taking your pain medication Fever greater than 100.4  F and chills Heart rate over 100 beats a minute Trouble breathing Chest pain Redness,  swelling, drainage, or foul odor at incision (surgical) sites If your incisions open or pull apart Swelling or pain in calf (lower leg) Diarrhea (Loose bowel movements that happen often), frequent watery, uncontrolled bowel movements Constipation, (no bowel movements for 3 days) if this  happens: Take Milk of Magnesia, 2 tablespoons by mouth, 3 times a day for 2 days if needed Stop taking Milk of Magnesia once you have had a bowel movement Call your doctor if constipation continues Or Take Miralax  (instead of Milk of Magnesia) following the label instructions Stop taking Miralax once you have had a bowel movement Call your doctor if constipation continues Anything you think is abnormal for you   Normal side effects after surgery: Unable to sleep at night or unable to concentrate Irritability Being tearful (crying) or depressed  These are common complaints, possibly related to your anesthesia, stress of surgery, and change in lifestyle, that usually go away a few weeks after surgery. If these feelings continue, call your medical doctor.  Wound Care: You may have surgical glue, steri-strips, or staples over your incisions after surgery Surgical glue: Looks like clear film over your incisions and will wear off a little at a time Steri-strips: Adhesive strips of tape over your incisions. You may notice a yellowish color on skin under the steri-strips. This is used to make the steri-strips stick better. Do not pull the steri-strips off - let them fall off Staples: Staples may be removed before you leave the hospital If you go home with staples, call [facility] for an appointment with your surgeons nurse to have staples removed 10 days after surgery, [phone] Showering: You may shower two (2) days after your surgery unless your surgeon tells you differently Wash gently around incisions with warm  soapy water, rinse well, and gently pat dry If you have a drain (tube from your incision), you may need someone to hold this while you shower No tub baths until staples are removed and incisions are healed   Medications: Medications should be liquid or crushed if larger than the size of a dime Extended release pills (medication that releases a little bit at a time  through the  day) should not be crushed Depending on the size and number of medications you take, you may need to space (take a few throughout the day)/change the time you take your medications so that you do not over-fill your pouch (smaller stomach) Make sure you follow-up with you primary care physician to make medication changes needed during rapid weight loss and life -style changes If you have diabetes, follow up with your doctor that orders your diabetes medication(s) within one week after surgery and check your blood sugar regularly  Do not drive while taking narcotics (pain medications)  Do not take acetaminophen (Tylenol) and Roxicet or Lortab Elixir at the same time since these pain medications contain acetaminophen   Diet:  First 2 Weeks You will see the nutritionist about two (2) weeks after your surgery. The nutritionist will increase the types of foods you can eat if you are handling liquids well: If you have severe vomiting or nausea and cannot handle clear liquids lasting longer than 1 day call your surgeon Protein Shake Drink at least 2 ounces of shake 5-6 times per day Each serving of protein shakes (usually 8-12 ounces) should have a minimum of: 15 grams of protein And no more than 5 grams of carbohydrate Goal for protein each day: Men = 80 grams per day Women = 60 grams per day    Protein powder may be added to fluids such as non-fat milk or Lactaid milk or Soy milk (limit to 35 grams added protein powder per serving)  Hydration Slowly increase the amount of water and other clear liquids as tolerated (See Acceptable Fluids) Slowly increase the amount of protein shake as tolerated Sip fluids slowly and throughout the day May use sugar substitutes in small amounts (no more than 6-8 packets per day; i.e. Splenda)  Fluid Goal The first goal is to drink at least 8 ounces of protein shake/drink per day (or as directed by the nutritionist); some examples of protein shakes  are [employer], [employer], EAS Edge HP, and Unjury. - See handout from pre-op Bariatric Education Class: Slowly increase the amount of protein shake you drink as tolerated You may find it easier to slowly sip shakes throughout the day It is important to get your proteins in first Your fluid goal is to drink 64-100 ounces of fluid daily It may take a few weeks to build up to this  32 oz. (or more) should be clear liquids And 32 oz. (or more) should be full liquids (see below for examples) Liquids should not contain sugar, caffeine, or carbonation  Clear Liquids: Water of Sugar-free flavored water (i.e. Fruit HO, Propel) Decaffeinated coffee or tea (sugar-free) Crystal lite, Wylers Lite, Minute Maid Lite Sugar-free Jell-O Bouillon or broth Sugar-free Popsicle:    - Less than 20 calories each; Limit 1 per day  Full Liquids:                   Protein Shakes/Drinks + 2 choices per day of other full liquids Full liquids must be: No More Than 12 grams of Carbs per serving  No More Than 3 grams of Fat per serving Strained low-fat cream soup Non-Fat milk Fat-free Lactaid Milk Sugar-free yogurt (Dannon Lite & Fit, Greek yogurt)    Vitamins and Minerals Start 1 day after surgery unless otherwise directed by your surgeon 2 Chewable Multivitamin / Multimineral Supplement with iron (i.e. Centrum for Adults) Vitamin B-12, 350-500 micrograms sub-lingual (place tablet under the tongue) each day Chewable Calcium Citrate with Vitamin D-3 (Example: 3 Chewable Calcium  Plus 600 with Vitamin D-3) Take 500 mg three (3) times a day for a total of 1500 mg each day Do not take all 3 doses of calcium at one time as it may cause constipation, and you can only absorb 500 mg at a time Do not mix multivitamins containing iron with calcium supplements;  take 2 hours apart Do not substitute Tums (calcium carbonate) for your calcium Menstruating women and those at risk for anemia ( a blood disease  that causes weakness) may need extra iron Talk to your doctor to see if you need more iron If you need extra iron: Total daily Iron recommendation (including Vitamins) is 50 to 100 mg Iron/day Do not stop taking or change any vitamins or minerals until you talk to your nutritionist or surgeon Your nutritionist and/or surgeon must approve all vitamin and mineral supplements   Activity and Exercise: It is important to continue walking at home. Limit your physical activity as instructed by your doctor. During this time, use these guidelines: Do not lift anything greater than ten  (10) pounds for at least two (2) weeks Do not go back to work or drive until Engineer, production says you can You may have sex when you feel comfortable It is VERY important for female patients to use a reliable birth control method; fertility often increase after surgery Do not get pregnant for at least 18 months Start exercising as soon as your doctor tells you that you can Make sure your doctor approves any physical activity Start with a simple walking program Walk 5-15 minutes each day, 7 days per week Slowly increase until you are walking 30-45 minutes per day Consider joining our [place] program. [phone] or email belt@[employer] .edu   Special Instructions Things to remember: Free counseling is available for you and your family through collaboration between [facility] and [place]. Please call [phone] and leave a message Use your CPAP when sleeping if this applies to you Consider buying a medical alert bracelet that says you had lap-band surgery     You will likely have your first fill (fluid added to your band) 6 - 8 weeks after surgery [facility] has a free Bariatric Surgery Support Group that meets monthly, the 3rd Thursday, [place]. You can see classes online at [email or web address] It is very important to keep all follow up appointments with your surgeon,  nutritionist, primary care physician, and behavioral health practitioner After the first year, please follow up with your bariatric surgeon and nutritionist at least once a year in order to maintain best weight loss results                    [facility]:  [place]: [phone]               Bariatric Nurse Coordinator: [phone]  Gastric Bypass/Sleeve Home Care Instructions  Rev. 02/2012  Reviewed and Endorsed                                                    by [facility] Patient Education Committee, Jan, 2014

## 2014-06-25 NOTE — Progress Notes (Signed)
Patient alert and oriented, pain is controlled. Patient is tolerating fluids,  advanced to protein shake today, patient tolerated well.  Reviewed Gastric sleeve discharge instructions with patient and patient is able to articulate understanding.  Provided information on BELT program, Support Group and [facility] outpatient pharmacy. All questions answered, will continue to monitor.  

## 2014-06-25 NOTE — Discharge Summary (Signed)
Physician Discharge Summary  Patient ID:  Stephanie Cannon  MRN: [id number]  DOB/AGE: 1970-07-24 44 y.o.  Admit date: 06/23/2014 Discharge date: 06/25/2014  Discharge Diagnoses:  1. Morbid obesity - weight 299, BMI of 56.5 2. She quit smoking around 2011. 3. HTN x 15 years 4. GERD - she takes Nexium  5. Chronic back pain.  She had back surgery 01/17/2014 by Dr. Arnoldo Cannon.    Active Problems:   Morbid obesity with body mass index of 50.0-59.9 in adult   Operation: Procedure(s):  LAPAROSCOPIC LYSIS OF ADHESIONS, GASTRIC SLEEVE RESECTION, AND UPPER ENDO on 06/23/2014 - Stephanie Cannon  Discharged Condition: good  Hospital Course: Stephanie Cannon is an 44 y.o. female whose primary care physician is Stephanie Nakayama, MD and who was admitted 06/23/2014 with a chief complaint of Morbid obesity.   She was brought to the operating room on 06/23/2014 and underwent  LAPAROSCOPIC LYSIS OF ADHESIONS, GASTRIC SLEEVE RESECTION, AND UPPER ENDO.  Her UGI was okay on 06/24/2014.  But that night, she had nausea and vomiting.  She is better this AM.  She will take some of the protein drink before she leaves.  But then plans to go home.   Her mother is in the room today.   The discharge instructions were reviewed with the patient.  Consults: None  Significant Diagnostic Studies: Results for orders placed or performed during the hospital encounter of 06/23/14  Hemoglobin and hematocrit, blood  Result Value Ref Range   Hemoglobin 13.9 12.0 - 15.0 g/dL   HCT 43.4 36.0 - 46.0 %  CBC WITH DIFFERENTIAL  Result Value Ref Range   WBC 10.9 (H) 4.0 - 10.5 K/uL   RBC 4.64 3.87 - 5.11 MIL/uL   Hemoglobin 13.2 12.0 - 15.0 g/dL   HCT 40.7 36.0 - 46.0 %   MCV 87.7 78.0 - 100.0 fL   MCH 28.4 26.0 - 34.0 pg   MCHC 32.4 30.0 - 36.0 g/dL   RDW 14.8 11.5 - 15.5 %   Platelets 254 150 - 400 K/uL   Neutrophils Relative % 76 43 - 77 %   Neutro Abs 8.4 (H) 1.7 - 7.7 K/uL   Lymphocytes Relative 16 12 - 46  %   Lymphs Abs 1.7 0.7 - 4.0 K/uL   Monocytes Relative 8 3 - 12 %   Monocytes Absolute 0.9 0.1 - 1.0 K/uL   Eosinophils Relative 0 0 - 5 %   Eosinophils Absolute 0.0 0.0 - 0.7 K/uL   Basophils Relative 0 0 - 1 %   Basophils Absolute 0.0 0.0 - 0.1 K/uL  Hemoglobin and hematocrit, blood  Result Value Ref Range   Hemoglobin 13.2 12.0 - 15.0 g/dL   HCT 41.6 36.0 - 46.0 %  CBC with Differential  Result Value Ref Range   WBC 8.7 4.0 - 10.5 K/uL   RBC 4.72 3.87 - 5.11 MIL/uL   Hemoglobin 13.2 12.0 - 15.0 g/dL   HCT 41.6 36.0 - 46.0 %   MCV 88.1 78.0 - 100.0 fL   MCH 28.0 26.0 - 34.0 pg   MCHC 31.7 30.0 - 36.0 g/dL   RDW 14.8 11.5 - 15.5 %   Platelets 234 150 - 400 K/uL   Neutrophils Relative % 63 43 - 77 %   Neutro Abs 5.5 1.7 - 7.7 K/uL   Lymphocytes Relative 28 12 - 46 %   Lymphs Abs 2.4 0.7 - 4.0 K/uL   Monocytes Relative 7 3 - 12 %  Monocytes Absolute 0.6 0.1 - 1.0 K/uL   Eosinophils Relative 2 0 - 5 %   Eosinophils Absolute 0.2 0.0 - 0.7 K/uL   Basophils Relative 0 0 - 1 %   Basophils Absolute 0.0 0.0 - 0.1 K/uL    Dg Ugi W/water Sol Cm  06/24/2014   CLINICAL DATA:  One day postop from sleeve gastrectomy. Morbid obesity.  EXAM: WATER SOLUBLE UPPER GI SERIES  TECHNIQUE: Single-column upper GI series was performed using water soluble contrast.  CONTRAST:  56mL OMNIPAQUE IOHEXOL 300 MG/ML  SOLN  COMPARISON:  None.  FLUOROSCOPY TIME:  Fluoroscopy Time (in minutes and seconds): 0 minutes 54 seconds  Number of Acquired Images:  9  FINDINGS: Upper GI series shows no evidence of esophageal mass or obstruction. Expected postoperative appearance of stomach is seen status post sleeve gastrectomy. No evidence of contrast leak or extravasation. No evidence of obstruction. Prompt gastric emptying is noted. Duodenum is normal in appearance.  IMPRESSION: Expected postoperative appearance status post sleeve gastrectomy. No evidence of postoperative leak, obstruction, or other complication.    Electronically Signed   By: Earle Gell M.D.   On: 06/24/2014 09:26    Discharge Exam:  Filed Vitals:   06/25/14 0609  BP: 155/81  Pulse: 100  Temp: 98.2 F (36.8 C)  Resp: 16    General: Obese AA F who is alert and generally healthy appearing.  Lungs: Clear to auscultation and symmetric breath sounds. Heart:  RRR. No murmur or rub. Abdomen: Soft. No hernia. Normal bowel sounds.  Incisions looks okay.  Discharge Medications:     Medication List    TAKE these medications        ALPRAZolam 1 MG tablet  Commonly known as:  XANAX  Take 1 tablet (1 mg total) by mouth at bedtime as needed. for sleep     benzonatate 100 MG capsule  Commonly known as:  TESSALON  Take 1 capsule (100 mg total) by mouth 2 (two) times daily as needed for cough.     cyclobenzaprine 10 MG tablet  Commonly known as:  FLEXERIL  Take 1 tablet (10 mg total) by mouth at bedtime.     HAIR SKIN & NAILS GUMMIES PO  Take 1 each by mouth 2 (two) times daily.     HYDROmorphone 4 MG tablet  Commonly known as:  DILAUDID  Take 4 mg by mouth every 6 (six) hours as needed for moderate pain.     MULTIVITAMIN GUMMIES ADULT PO  Take 1 each by mouth 2 (two) times daily.     NEXIUM 24HR 20 MG capsule  Generic drug:  esomeprazole  Take 1 capsule (20 mg total) by mouth daily at 12 noon.     penicillin v potassium 500 MG tablet  Commonly known as:  VEETID  Take 1 tablet (500 mg total) by mouth 3 (three) times daily.     phentermine 37.5 MG tablet  Commonly known as:  ADIPEX-P  Take 1 tablet (37.5 mg total) by mouth daily before breakfast.     triamterene-hydrochlorothiazide 37.5-25 MG per tablet  Commonly known as:  MAXZIDE-25  TAKE ONE & ONE-HALF TABLETS BY MOUTH ONCE DAILY        Disposition: 06-Home-Health Care Svc      Discharge Instructions    Ambulate hourly while awake    Complete by:  As directed      Call MD for:  difficulty breathing, headache or visual disturbances    Complete by:  As  directed      Call MD for:  persistant dizziness or light-headedness    Complete by:  As directed      Call MD for:  persistant nausea and vomiting    Complete by:  As directed      Call MD for:  redness, tenderness, or signs of infection (pain, swelling, redness, odor or green/yellow discharge around incision site)    Complete by:  As directed      Call MD for:  severe uncontrolled pain    Complete by:  As directed      Call MD for:  temperature >101 F    Complete by:  As directed      Diet bariatric full liquid    Complete by:  As directed      Incentive spirometry    Complete by:  As directed   Perform hourly while awake           Activity:  Driving - May drive in 2 to 4 days, if doing well   Lifting - Take it easy for 7 days, then no limit  Wound Care:   May shower  Diet:  Post op sleeve diet  Follow up appointment:  Call Dr. Pollie Friar office [facility]) at [phone] for an appointment in 2 to 3 weeks.  Medications and dosages:  Resume your home medications.  You have a prescription for:  Roxicet elixir  Signed: Alphonsa Overall, M.D., [facility] Office:  [phone]  06/25/2014, 7:17 AM

## 2014-06-25 NOTE — Progress Notes (Signed)
Pt complaining of nausea. Pt vomited x 1. MD notified. New orders received. Will continue to monitor pt.

## 2014-06-25 NOTE — Care Management Note (Signed)
Case Management Note  Patient Details  Name: TORIANNA JUNIO MRN: [id number] Date of Birth: 1970/04/27  Subjective/Objective:              Admitted s/p sleeve gastrectomy and lysis of adhesions.        Action/Plan: Discharge planning  Expected Discharge Date:                  Expected Discharge Plan:  Home/Self Care  In-House Referral:  NA  Discharge planning Services  CM Consult  Post Acute Care Choice:    Choice offered to:     DME Arranged:    DME Agency:     HH Arranged:    HH Agency:     Status of Service:  Completed, signed off  Medicare Important Message Given:  N/A - LOS <3 / Initial given by admissions Date Medicare IM Given:    Medicare IM give by:    Date Additional Medicare IM Given:    Additional Medicare Important Message give by:     If discussed at [place] of Stay Meetings, dates discussed:    Additional Comments:  Guadalupe Maple, RN 06/25/2014, 9:52 AM

## 2014-06-30 ENCOUNTER — Telehealth (HOSPITAL_COMMUNITY): Payer: Self-pay

## 2014-06-30 NOTE — Telephone Encounter (Signed)
Made discharge phone call to patient per DROP protocol. Asking the following questions.    1. Do you have someone to care for you now that you are home?  yes 2. Are you having pain now that is not relieved by your pain medication?  No, I have no pain 3. Are you able to drink the recommended daily amount of fluids (48 ounces minimum/day) and protein (60-80 grams/day) as prescribed by the dietitian or nutritional counselor?  yes 4. Are you taking the vitamins and minerals as prescribed?  yes 5. Do you have the "on call" number to contact your surgeon if you have a problem or question?  yes 6. Are your incisions free of redness, swelling or drainage? (If steri strips, address that these can fall off, shower as tolerated) yes 7. Have your bowels moved since your surgery?  If not, are you passing gas?  yes 8. Are you up and walking 3-4 times per day?  yes    1. Do you have an appointment made to see your surgeon in the next month?  yes 2. Were you provided your discharge medications before your surgery or before you were discharged from the hospital and are you taking them without problem?  yes 3. Were you provided phone numbers to the clinic/surgeon's office? yes  4. Did you watch the patient education video module in the (clinic, surgeon's office, etc.) before your surgery? yes 5. Do you have a discharge checklist that was provided to you in the hospital to reference with instructions on how to take care of yourself after surgeryyes?   6. Did you see a dietitian or nutritional counselor while you were in the hospital?  yes 7. Do you have an appointment to see a dietitian or nutritional counselor in the next month?yes

## 2014-07-08 ENCOUNTER — Encounter: Payer: BC Managed Care – PPO | Attending: Surgery

## 2014-07-08 VITALS — Ht 61.0 in | Wt 280.0 lb

## 2014-07-08 DIAGNOSIS — Z6841 Body Mass Index (BMI) 40.0 and over, adult: Secondary | ICD-10-CM | POA: Diagnosis not present

## 2014-07-08 DIAGNOSIS — Z713 Dietary counseling and surveillance: Secondary | ICD-10-CM | POA: Insufficient documentation

## 2014-07-08 NOTE — Progress Notes (Signed)
Bariatric Class:  Appt start time: 1530 end time:  1630.  2 Week Post-Operative Nutrition Class  Patient was seen on 07/08/14 for Post-Operative Nutrition education [facility].   Surgery date: 06/23/14 Surgery type: Gastric sleeve Start weight at [facility]: 303 lbs on 02/25/14 Weight today: 280.0 lbs  Weight change: 21 lbs  TANITA  BODY COMP RESULTS  05/12/14 07/08/14   BMI (kg/m^2) 56.9 52.9   Fat Mass (lbs) 150 146.5   Fat Free Mass (lbs) 151 133.5   Total Body Water (lbs) 110.5 97.5    The following the learning objectives were met by the patient during this course:  Identifies Phase 3A (Soft, High Proteins) Dietary Goals and will begin from 2 weeks post-operatively to 2 months post-operatively  Identifies appropriate sources of fluids and proteins   States protein recommendations and appropriate sources post-operatively  Identifies the need for appropriate texture modifications, mastication, and bite sizes when consuming solids  Identifies appropriate multivitamin and calcium sources post-operatively  Describes the need for physical activity post-operatively and will follow MD recommendations  States when to call healthcare provider regarding medication questions or post-operative complications  Handouts given during class include:  Phase 3A: Soft, High Protein Diet Handout  Follow-Up Plan: Patient will follow-up at [facility] in 6 weeks for 2 month post-op nutrition visit for diet advancement per MD.

## 2014-07-25 ENCOUNTER — Telehealth: Payer: Self-pay

## 2014-07-25 DIAGNOSIS — T162XXA Foreign body in left ear, initial encounter: Secondary | ICD-10-CM

## 2014-07-25 NOTE — Telephone Encounter (Signed)
Patient has been referred.  

## 2014-07-25 NOTE — Telephone Encounter (Signed)
Wants to know if she can have a referral to ENT because she has the tip of a q-tip stuck in her left ear. States its been there awhile and she has been trying to get it out but she can't. Ok to refer to [facility]?

## 2014-07-25 NOTE — Telephone Encounter (Signed)
Will refer to ENT

## 2014-08-19 ENCOUNTER — Encounter: Payer: BC Managed Care – PPO | Attending: Surgery | Admitting: Dietician

## 2014-08-19 ENCOUNTER — Encounter: Payer: Self-pay | Admitting: Dietician

## 2014-08-19 DIAGNOSIS — Z6841 Body Mass Index (BMI) 40.0 and over, adult: Secondary | ICD-10-CM | POA: Diagnosis not present

## 2014-08-19 DIAGNOSIS — Z713 Dietary counseling and surveillance: Secondary | ICD-10-CM | POA: Insufficient documentation

## 2014-08-19 NOTE — Patient Instructions (Addendum)
Goals:  Follow Phase 3B: High Protein + Non-Starchy Vegetables  Eat 3-6 small meals/snacks, every 3-5 hrs  Bring several high protein snacks to work: cheese, Yoplait Light (less than 12 grams carbs), deli meat, boiled eggs, tuna, chicken salad  Increase lean protein foods to meet 60g goal  Increase fluid intake to 64oz +  Avoid drinking 15 minutes before, during and 30 minutes after eating  Aim for >30 min of physical activity daily  TANITA  BODY COMP RESULTS  05/12/14 07/08/14 08/19/14   BMI (kg/m^2) 56.9 52.9 48.8   Fat Mass (lbs) 150 146.5 130.5   Fat Free Mass (lbs) 151 133.5 128   Total Body Water (lbs) 110.5 97.5 93.5

## 2014-08-19 NOTE — Progress Notes (Signed)
  Follow-up visit:  8 Weeks Post-Operative Sleeve Gastrectomy Surgery  Medical Nutrition Therapy:  Appt start time: 1050 end time:  1115  Primary concerns today: Post-operative Bariatric Surgery Nutrition Management.  Kimbra returns having lost 21 lbs since post op class. She has been drinking 1-2 shakes per day. Able to tolerate 2-3 oz of meat at a time. 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 She has been sleeping late and only having 2 meals a day.   Surgery date: 06/23/14 Surgery type: Gastric sleeve Start weight at [facility]: 303 lbs on 02/25/14 Weight today: 258.5 lbs Weight change: 21.5 lbs Total weight lost: 44.5 lbs  TANITA  BODY COMP RESULTS  05/12/14 07/08/14 08/19/14   BMI (kg/m^2) 56.9 52.9 48.8   Fat Mass (lbs) 150 146.5 130.5   Fat Free Mass (lbs) 151 133.5 128   Total Body Water (lbs) 110.5 97.5 93.5    Preferred Learning Style:   No preference indicated   Learning Readiness:   Ready  24-hr recall: B (AM): Premier protein shake (30g) Snk (AM):   L (PM):  Snk (PM):   D (PM): meat, salad or vegetables (21g) Snk (PM):   Fluid intake: ~40 oz water, 11-22 oz protein shake Estimated total protein intake: 51g  Medications: see list Supplementation: taking  Using straws: yes, no gas pain Drinking while eating: no Hair loss: none Carbonated beverages: no N/V/D/C: constipated, takes stool softeners and laxatives Dumping syndrome: yes with sweet potato with Splenda  Recent physical activity:  More active overall  Progress Towards Goal(s):  In progress.  Handouts given during visit include:  Phase 3B lean protein + non starchy vegetables   Nutritional Diagnosis:  [place]-3.3 Overweight/obesity related to past poor dietary habits and physical inactivity as evidenced by patient w/ recent sleeve gastrectomy surgery following dietary guidelines for continued weight loss.     Intervention:  Nutrition counseling provided.  Samples  provided and patient instructed on proper use: Unjury protein powder (chocolate - qty 2) Lot#: [id number] Exp: 07/2015  Teaching Method Utilized:  Visual Auditory Hands on  Barriers to learning/adherence to lifestyle change: none  Demonstrated degree of understanding via:  Teach Back   Monitoring/Evaluation:  Dietary intake, exercise, and body weight. Follow up in 6 weeks for 3.5 month post-op visit.

## 2014-08-21 ENCOUNTER — Ambulatory Visit (INDEPENDENT_AMBULATORY_CARE_PROVIDER_SITE_OTHER): Payer: BC Managed Care – PPO | Admitting: Otolaryngology

## 2014-08-21 DIAGNOSIS — H900 Conductive hearing loss, bilateral: Secondary | ICD-10-CM | POA: Diagnosis not present

## 2014-08-21 DIAGNOSIS — H6123 Impacted cerumen, bilateral: Secondary | ICD-10-CM | POA: Diagnosis not present

## 2014-09-01 ENCOUNTER — Telehealth: Payer: Self-pay

## 2014-09-01 DIAGNOSIS — N3000 Acute cystitis without hematuria: Secondary | ICD-10-CM

## 2014-09-01 DIAGNOSIS — R109 Unspecified abdominal pain: Secondary | ICD-10-CM

## 2014-09-01 NOTE — Telephone Encounter (Signed)
Patient aware.

## 2014-09-01 NOTE — Telephone Encounter (Signed)
Verbal order given for Urinalysis with reflex culture to be collected at lab.   Order faxed to [facility]

## 2014-09-01 NOTE — Telephone Encounter (Signed)
ADVISE AND FAX IN ORDER FOR ccua AND REFLEX C/S CAN GO TOP LAB BY 6PM TODAY, WHEN RESULT AVILABLE IN AM i WILL  ADVISE BASED ON RESULT

## 2014-09-02 ENCOUNTER — Other Ambulatory Visit: Payer: Self-pay | Admitting: Family Medicine

## 2014-09-02 LAB — URINALYSIS W MICROSCOPIC + REFLEX CULTURE
Bilirubin Urine: NEGATIVE
Casts: NONE SEEN [LPF]
Crystals: NONE SEEN [HPF]
Glucose, UA: NEGATIVE
Ketones, ur: NEGATIVE
Nitrite: NEGATIVE
Protein, ur: NEGATIVE
Specific Gravity, Urine: 1.014 (ref 1.001–1.035)
Yeast: NONE SEEN [HPF]
pH: 5.5 (ref 5.0–8.0)

## 2014-09-02 MED ORDER — CIPROFLOXACIN HCL 500 MG PO TABS: 500.0000 mg | ORAL_TABLET | Freq: Two times a day (BID) | ORAL | Status: DC

## 2014-09-03 ENCOUNTER — Encounter: Payer: Self-pay | Admitting: Family Medicine

## 2014-09-03 ENCOUNTER — Ambulatory Visit (HOSPITAL_COMMUNITY)
Admission: RE | Admit: 2014-09-03 | Discharge: 2014-09-03 | Disposition: A | Discharge status: Home / Self Care | Payer: BC Managed Care – PPO | Source: Ambulatory Visit | Attending: Family Medicine | Admitting: Family Medicine

## 2014-09-03 ENCOUNTER — Ambulatory Visit: Payer: BC Managed Care – PPO | Admitting: Family Medicine

## 2014-09-03 ENCOUNTER — Ambulatory Visit (INDEPENDENT_AMBULATORY_CARE_PROVIDER_SITE_OTHER): Payer: BC Managed Care – PPO | Admitting: Family Medicine

## 2014-09-03 VITALS — BP 124/80 | HR 112 | Temp 102.9°F | Resp 16 | Ht 62.0 in | Wt 247.0 lb

## 2014-09-03 DIAGNOSIS — M545 Low back pain, unspecified: Secondary | ICD-10-CM

## 2014-09-03 DIAGNOSIS — I10 Essential (primary) hypertension: Secondary | ICD-10-CM | POA: Diagnosis not present

## 2014-09-03 DIAGNOSIS — F419 Anxiety disorder, unspecified: Secondary | ICD-10-CM

## 2014-09-03 DIAGNOSIS — N10 Acute tubulo-interstitial nephritis: Secondary | ICD-10-CM | POA: Insufficient documentation

## 2014-09-03 DIAGNOSIS — Z6841 Body Mass Index (BMI) 40.0 and over, adult: Secondary | ICD-10-CM

## 2014-09-03 DIAGNOSIS — R109 Unspecified abdominal pain: Secondary | ICD-10-CM | POA: Diagnosis not present

## 2014-09-03 DIAGNOSIS — R509 Fever, unspecified: Secondary | ICD-10-CM | POA: Insufficient documentation

## 2014-09-03 DIAGNOSIS — Z566 Other physical and mental strain related to work: Secondary | ICD-10-CM | POA: Insufficient documentation

## 2014-09-03 DIAGNOSIS — M79605 Pain in left leg: Secondary | ICD-10-CM

## 2014-09-03 LAB — BASIC METABOLIC PANEL
ANION GAP: 12 (ref 5–15)
BUN: 11 mg/dL (ref 6–20)
CALCIUM: 9.4 mg/dL (ref 8.9–10.3)
CO2: 29 mmol/L (ref 22–32)
CREATININE: 0.88 mg/dL (ref 0.44–1.00)
Chloride: 94 mmol/L — ABNORMAL LOW (ref 101–111)
GFR calc Af Amer: >60 mL/min (ref >60)
Glucose, Bld: 116 mg/dL — ABNORMAL HIGH (ref 65–99)
Potassium: 3 mmol/L — ABNORMAL LOW (ref 3.5–5.1)
SODIUM: 135 mmol/L (ref 135–145)

## 2014-09-03 LAB — CBC WITH DIFFERENTIAL/PLATELET
BASOS ABS: 0 10*3/uL (ref 0.0–0.1)
BASOS PCT: 0 % (ref 0–1)
EOS ABS: 0 10*3/uL (ref 0.0–0.7)
Eosinophils Relative: 0 % (ref 0–5)
HCT: 42.4 % (ref 36.0–46.0)
HEMOGLOBIN: 14.6 g/dL (ref 12.0–15.0)
Lymphocytes Relative: 16 % (ref 12–46)
Lymphs Abs: 2 10*3/uL (ref 0.7–4.0)
MCH: 30.1 pg (ref 26.0–34.0)
MCHC: 34.4 g/dL (ref 30.0–36.0)
MCV: 87.4 fL (ref 78.0–100.0)
MONOS PCT: 8 % (ref 3–12)
Monocytes Absolute: 1 10*3/uL (ref 0.1–1.0)
NEUTROS ABS: 9.2 10*3/uL — AB (ref 1.7–7.7)
NEUTROS PCT: 76 % (ref 43–77)
Platelets: 263 10*3/uL (ref 150–400)
RBC: 4.85 MIL/uL (ref 3.87–5.11)
RDW: 14.4 % (ref 11.5–15.5)
WBC: 12.2 10*3/uL — AB (ref 4.0–10.5)

## 2014-09-03 MED ORDER — LEVOFLOXACIN 500 MG PO TABS: 500.0000 mg | ORAL_TABLET | Freq: Every day | ORAL | Status: DC

## 2014-09-03 MED ORDER — FLUCONAZOLE 150 MG PO TABS: ORAL_TABLET | ORAL | Status: DC

## 2014-09-03 MED ORDER — CEFTRIAXONE SODIUM 500 MG IJ SOLR
500.0000 mg | Freq: Once | INTRAMUSCULAR | Status: AC
  Administered 2014-09-03: 500 mg via INTRAMUSCULAR

## 2014-09-03 MED ORDER — POTASSIUM CHLORIDE ER 10 MEQ PO TBCR: 10.0000 meq | EXTENDED_RELEASE_TABLET | Freq: Every day | ORAL | Status: DC

## 2014-09-03 NOTE — Assessment & Plan Note (Signed)
Controlled, no change in medication  

## 2014-09-03 NOTE — Assessment & Plan Note (Signed)
Febrile with right flank tenderness, tachycardia and UTI. Rocephin in office , additional levaquin for 7 days, stat US and labs . Work excuse x 1 week

## 2014-09-03 NOTE — Assessment & Plan Note (Signed)
Improved s/p bariatric surgery Patient re-educated about  the importance of commitment to a  minimum of 150 minutes of exercise per week.  The importance of healthy food choices with portion control discussed. Encouraged to start a food diary, count calories and to consider  joining a support group. Sample diet sheets offered. Goals set by the patient for the next several months.   Weight /BMI 09/03/2014 08/19/2014 07/08/2014  WEIGHT 247 lb 258 lb 8 oz 280 lb  HEIGHT 5\' 2"  5\' 1"  5\' 1"   BMI 45.17 kg/m2 48.87 kg/m2 52.93 kg/m2    Current exercise per week  90 mins

## 2014-09-03 NOTE — Progress Notes (Signed)
Subjective:    Patient ID: Stephanie Cannon, female    DOB: [birth year], 44 y.o.   MRN: [id number]  HPI    CHALESE PEACH     MRN: [id number]      DOB: 02-05-1970   HPI [name] is here with an approximate 1 week h/o increasing malaise, light headedness, breaking out in sweats and also noted pressure, frequency and malodorous urine last week end Reports poor appetite, increased stress, feels as though she is being singled out in a negative way on her job "bullied" Today she had to call for help when getting dressed to come to the office' Felt extremely weak, and near collapsed on the floor  Has to "force herself to eat since her return to work in  the past 1 week. Recently had bariaitric surgery and had been doing well up until this timern to work and with the stress that she is experiencing on her job.feels as though her intake of both solid and liquid is inadequate ROS  Denies sinus pressure, nasal congestion, ear pain or sore throat. Denies chest congestion, productive cough or wheezing. Denies chest pains, palpitations and leg swelling . Marked improvement in back pain following back surgery and weight loss. Denies skin break down or rash.   PE  BP 124/80 mmHg  Pulse 112  Temp(Src) 102.9 F (39.4 C)  Resp 16  Ht 5\' 2"  (1.575 m)  Wt 247 lb (112.038 kg)  BMI 45.17 kg/m2  SpO2 99%  Patient alert and oriented and in mild cardiopulmonary  Distress.Ill appearing  HEENT: No facial asymmetry, EOMI,   oropharynx pink and moist.  Neck supple no JVD, no mass.  Chest: Clear to auscultation bilaterally.  CVS: S1, S2 no murmurs, no S3.Regular rate.  ABD: Soft right flank tenderness  Ext: No edema  MS: Adequate ROM spine, shoulders, hips and knees.  Skin: Intact, no ulcerations or rash noted.  Psych: Good eye contact, tearful affect. Memory intact not anxious  Tearful and depressed appearing.  CNS: CN 2-12 intact, power,  normal throughout.no focal deficits  noted.   Assessment & Plan  Acute pyelonephritis Febrile with right flank tenderness, tachycardia and UTI. Rocephin in office , additional levaquin for 7 days, stat US and labs . Work excuse x 1 week  Morbid obesity with body mass index of 50.0-59.9 in adult Improved s/p bariatric surgery Patient re-educated about  the importance of commitment to a  minimum of 150 minutes of exercise per week.  The importance of healthy food choices with portion control discussed. Encouraged to start a food diary, count calories and to consider  joining a support group. Sample diet sheets offered. Goals set by the patient for the next several months.   Weight /BMI 09/03/2014 08/19/2014 07/08/2014  WEIGHT 247 lb 258 lb 8 oz 280 lb  HEIGHT 5\' 2"  5\' 1"  5\' 1"   BMI 45.17 kg/m2 48.87 kg/m2 52.93 kg/m2    Current exercise per week  90 mins   Essential hypertension Controlled, no change in medication   Lumbar pain with radiation down left leg Improved following lumbar surgery in 2016 and weight loss  Stress at work Reports increased anxiety and stress n th job, relates her inadequate intake of food and drink to feeling stressed and overwhelmed, resulting in her being physically ill also, weak and feels light headed and near collapsinbg  Anxiety Increased, uncontrolled and debilitating, pt reports inability to sustain adequate intake of fluid and solid, work excuse x 1  week      Review of Systems     Objective:   Physical Exam        Assessment & Plan:

## 2014-09-03 NOTE — Patient Instructions (Signed)
F/u in 2 month, call if you need me before  You are treated for acute pyelonephritis, rocephin in the office and an additional 7 days of levaquin  Stat labs, and renal US today  Work excuse from today to return next week Thursday

## 2014-09-03 NOTE — Assessment & Plan Note (Addendum)
Reports increased anxiety and stress n th job, relates her inadequate intake of food and drink to feeling stressed and overwhelmed, resulting in her being physically ill also, weak and feels light headed and near collapsinbg

## 2014-09-03 NOTE — Assessment & Plan Note (Signed)
Improved following lumbar surgery in 2016 and weight loss

## 2014-09-04 ENCOUNTER — Other Ambulatory Visit: Payer: Self-pay

## 2014-09-04 ENCOUNTER — Other Ambulatory Visit: Payer: Self-pay | Admitting: Family Medicine

## 2014-09-04 DIAGNOSIS — I10 Essential (primary) hypertension: Secondary | ICD-10-CM

## 2014-09-04 LAB — URINE CULTURE: Colony Count: >=100000

## 2014-09-06 ENCOUNTER — Telehealth: Payer: Self-pay | Admitting: Family Medicine

## 2014-09-06 MED ORDER — MIRTAZAPINE 15 MG PO TBDP: 15.0000 mg | ORAL_TABLET | Freq: Every day | ORAL | Status: DC

## 2014-09-06 NOTE — Telephone Encounter (Signed)
Pt called in stating that she is "unable to eat or drink" as she should, stressed out and ovrwhelmed with her current job situation. Wants to "get herself right" feels incapable of working at this time. States she had no problem with eating before she returned to work this past week. Sh has lost 58 pounds in 8 weeks Wants to see psychiatry  As she will need extended period of time Denies vertigo No longer febrile Remeron sent to her pharmacy to start in the interim, she is aware. I advised her to d/c the levaquin the UTI is treated PLEASE refer to psych in [place]/ next door asap for evaluation and management , depression with poor appetite and excessive weight loss She will need extension on her work note and a f/u scheduled with me in week of Sept 19, new return to work date would be at least till she sees me, but will be determined by psychiatry.

## 2014-09-07 NOTE — Assessment & Plan Note (Signed)
Increased, uncontrolled and debilitating, pt reports inability to sustain adequate intake of fluid and solid, work excuse x 1 week

## 2014-09-09 DIAGNOSIS — F419 Anxiety disorder, unspecified: Secondary | ICD-10-CM

## 2014-09-09 DIAGNOSIS — R634 Abnormal weight loss: Secondary | ICD-10-CM

## 2014-09-09 NOTE — Telephone Encounter (Signed)
Scheduled and referred and patient aware

## 2014-09-14 ENCOUNTER — Telehealth: Payer: Self-pay | Admitting: Family Medicine

## 2014-09-14 NOTE — Telephone Encounter (Signed)
ls follow through and try to a obtain an appt date for this pt with psychiatry, she is out of work based on mental health grounds in part. A referral to Dr Adele Schilder in [place] was entered last week Thank you

## 2014-09-15 NOTE — Telephone Encounter (Signed)
I called Dr. Karmen Bongo office and spoke with Sunday Spillers and pt has a appt 11/13/14 at 1:00 and is aware of the appt.

## 2014-09-16 ENCOUNTER — Telehealth: Payer: Self-pay

## 2014-09-16 NOTE — Telephone Encounter (Signed)
Called and left detailed message for patient asking her to call insurance and find out what providers are in network for psych and also research as to if her job provides free counseling.

## 2014-09-16 NOTE — Telephone Encounter (Signed)
pls try to work thru this as discussed

## 2014-09-16 NOTE — Telephone Encounter (Signed)
Spoke with patient and she will call and see who is in her network for psych care

## 2014-09-16 NOTE — Telephone Encounter (Signed)
Called and [facility] next door to our office does not have any new patient appointments until the end of October.

## 2014-09-16 NOTE — Telephone Encounter (Signed)
Noted , I appreciate, there may be other Providers in [place] / [facility] also outside of the system that we MAY need to look to. I will send Dr Adele Schilder a message also, may need to try to spk with him also will let you know if no response

## 2014-09-18 ENCOUNTER — Telehealth: Payer: Self-pay

## 2014-09-18 NOTE — Telephone Encounter (Signed)
Needs ov next week with me

## 2014-09-18 NOTE — Telephone Encounter (Signed)
Just wanted to give an update on psych referral for patient. She is scheduled with Dr Adele Schilder for Nov 10 as of right now. Dr Harrington Challenger next door- soonest available was the end of Oct. Called [facility] and they don't take her insurance. Gabriell had mentioned a [facility] Hatcher that was in network according to her but she was a memory and aging care specialist and didn't accept new patients. Shalaya has dropped off a FMLA but has not paid for it because [employer] can't take credit cards for forms. Loma Sousa called and left a message on her voicemail about holding off on payment until you agree/or decline to fill it out.

## 2014-09-19 NOTE — Addendum Note (Signed)
Addended by: [name] E on: 09/19/2014 11:10 AM   Modules accepted: Orders

## 2014-09-19 NOTE — Telephone Encounter (Signed)
Spoke with patient and she would rather hold on medication. She has an appt with psychologist on 9/21.  She is still coming for appt here on 9/19 for fmla.

## 2014-09-19 NOTE — Telephone Encounter (Signed)
Pls call pt back and let her  Know that I strongly recommend starting med for anxiety only, buspar, 5 mg 3 times daily, since has not yet filled prescription sent before as she told me ear;ier this week. NEEDs to start med to help her condition, thankful she has a connection with therapy, condolence re the passing of in law, hope she will keep appt as needs ov to help with the management including FMLA form, I have insufficient info to complete the form without OV

## 2014-09-19 NOTE — Telephone Encounter (Signed)
Patient has ov for 2022-04-15 9/19.  States that she was called by Hospital doctor in [place] on [address].  She will call her back for an appointment.   Was asking if her FMLA could be filled out today. I notified that we would not be able to accommodate that request.  She will call back if she thinks that she will need to change appointment for 2022/04/15 due to death in her husband's family.

## 2014-09-19 NOTE — Telephone Encounter (Signed)
See response

## 2014-09-22 ENCOUNTER — Telehealth: Payer: Self-pay | Admitting: *Deleted

## 2014-09-22 ENCOUNTER — Ambulatory Visit (INDEPENDENT_AMBULATORY_CARE_PROVIDER_SITE_OTHER): Payer: BC Managed Care – PPO | Admitting: Family Medicine

## 2014-09-22 ENCOUNTER — Ambulatory Visit: Payer: BC Managed Care – PPO | Admitting: Family Medicine

## 2014-09-22 ENCOUNTER — Encounter: Payer: Self-pay | Admitting: Family Medicine

## 2014-09-22 VITALS — BP 124/80 | HR 78 | Resp 16 | Ht 62.0 in | Wt 243.1 lb

## 2014-09-22 DIAGNOSIS — F329 Major depressive disorder, single episode, unspecified: Secondary | ICD-10-CM | POA: Diagnosis not present

## 2014-09-22 DIAGNOSIS — Z566 Other physical and mental strain related to work: Secondary | ICD-10-CM | POA: Diagnosis not present

## 2014-09-22 DIAGNOSIS — F409 Phobic anxiety disorder, unspecified: Secondary | ICD-10-CM | POA: Insufficient documentation

## 2014-09-22 DIAGNOSIS — F341 Dysthymic disorder: Secondary | ICD-10-CM

## 2014-09-22 DIAGNOSIS — F418 Other specified anxiety disorders: Secondary | ICD-10-CM

## 2014-09-22 DIAGNOSIS — F5105 Insomnia due to other mental disorder: Secondary | ICD-10-CM

## 2014-09-22 DIAGNOSIS — F324 Major depressive disorder, single episode, in partial remission: Secondary | ICD-10-CM | POA: Insufficient documentation

## 2014-09-22 DIAGNOSIS — Z6841 Body Mass Index (BMI) 40.0 and over, adult: Secondary | ICD-10-CM

## 2014-09-22 DIAGNOSIS — K219 Gastro-esophageal reflux disease without esophagitis: Secondary | ICD-10-CM

## 2014-09-22 DIAGNOSIS — F32A Depression, unspecified: Secondary | ICD-10-CM

## 2014-09-22 DIAGNOSIS — I10 Essential (primary) hypertension: Secondary | ICD-10-CM

## 2014-09-22 DIAGNOSIS — Z23 Encounter for immunization: Secondary | ICD-10-CM | POA: Insufficient documentation

## 2014-09-22 MED ORDER — FLUOXETINE HCL 20 MG PO TABS: 20.0000 mg | ORAL_TABLET | Freq: Every day | ORAL | Status: DC

## 2014-09-22 MED ORDER — TRAZODONE HCL 50 MG PO TABS: 25.0000 mg | ORAL_TABLET | Freq: Every evening | ORAL | Status: DC | PRN

## 2014-09-22 MED ORDER — TRIAMTERENE-HCTZ 37.5-25 MG PO TABS: 1.0000 | ORAL_TABLET | Freq: Every day | ORAL | Status: DC

## 2014-09-22 NOTE — Assessment & Plan Note (Signed)
After obtaining informed consent, the vaccine is  administered by LPN.  

## 2014-09-22 NOTE — Assessment & Plan Note (Signed)
Sleep hygiene reviewed and written information offered also. Prescription sent for  medication needed.  

## 2014-09-22 NOTE — Assessment & Plan Note (Signed)
Improved. Pt applauded on succesful weight loss through lifestyle change, and encouraged to continue same. Needs to start daily physical activity.

## 2014-09-22 NOTE — Assessment & Plan Note (Signed)
Start fluoxetine and trazodone Start daily exercise for 30 mins Attempt sooner psych appt, FMLA extended to return date after seeing psych Keep appt with therapist in 2 days

## 2014-09-22 NOTE — Assessment & Plan Note (Signed)
Controlled, no change in medication  

## 2014-09-22 NOTE — Patient Instructions (Addendum)
F/u in mid Novemebr 2nd week, FMLA to continue until date of return to office  Need to take  medication for depression and sleep starting today   Pls keep therapy appt on Wednesday of this week and use to your advantage  It is important that you exercise regularly at least 30 minutes7 times a week. If you develop chest pain, have severe difficulty breathing, or feel very tired, stop exercising immediately and seek medical attention    FMLA to end on date of return to office unless psychiatry releases you sooner  Flu vaccine today  Thanks for choosing [facility], we consider it a privelige to serve you. Hope you feel better soon

## 2014-09-22 NOTE — Telephone Encounter (Signed)
Met with pt at ov today and treatment plan established

## 2014-09-22 NOTE — Assessment & Plan Note (Signed)
Controlled, on lower dose of medication, continue same DASH diet and commitment to daily physical activity for a minimum of 30 minutes discussed and encouraged, as a part of hypertension management. The importance of attaining a healthy weight is also discussed.  BP/Weight 09/22/2014 09/03/2014 08/19/2014 07/08/2014 06/25/2014 06/23/2014 1/90/1222  Systolic BP 411 464 - - 314 - 276  Diastolic BP 80 80 - - 85 - 89  Wt. (Lbs) 243.12 247 258.5 280 - 293.13 293.4  BMI 44.46 45.17 48.87 52.93 - 55.41 55.47

## 2014-09-22 NOTE — Telephone Encounter (Signed)
Patient is scheduled to see Dr. Doyne Keel 10/30/14 appt time 1:00 arrival time is 12:15. I called patient to make her aware no answer LMOM.

## 2014-09-22 NOTE — Assessment & Plan Note (Signed)
Pt placed on FMLA an dis getting help with mental health as she addresses how she handles work stress as well as personal stress and mental health issues which may have been smoldering in the past ,but have been aggravated by current work situation as she has experienced it in the recent past

## 2014-09-22 NOTE — Progress Notes (Signed)
Subjective:    Patient ID: Stephanie Cannon, female    DOB: 1970-12-18, 44 y.o.   MRN: [id number]  HPI Pt in for re evaluation as far as need for extension in her leave from work as she deals with mental health issues. She has been experiencing significant stress on her job since she returned to work with start of new semester Today , as different from prior visit, she has a positive depression screen, and is now willing to both rake medications prescribed , and also is looking forward to starting with a therapist in the next  days Reports improvement in apetite with less nausea, which she  Relates to the fact that she has removed herself from stressful situations and also recent UTI is teated  Review of Systems See HPI Denies recent fever or chills. Denies sinus pressure, nasal congestion, ear pain or sore throat. Denies chest congestion, productive cough or wheezing. Denies chest pains, palpitations and leg swelling Denies abdominal pain, nausea, vomiting,diarrhea or constipation.   Denies dysuria, frequency, hesitancy or incontinence. Denies uncontrolled  joint pain, swelling and limitation in mobility. Denies headaches, seizures, numbness, or tingling.  Denies skin break down or rash.        Objective:   Physical Exam BP 124/80 mmHg  Pulse 78  Resp 16  Ht 5\' 2"  (1.575 m)  Wt 243 lb 1.9 oz (110.279 kg)  BMI 44.46 kg/m2  SpO2 99% Patient alert and oriented and in no cardiopulmonary distress.  HEENT: No facial asymmetry, EOMI,   oropharynx pink and moist.  Neck supple no JVD, no mass.  Chest: Clear to auscultation bilaterally.  CVS: S1, S2 no murmurs, no S3.Regular rate.  ABD: Soft non tender.   Ext: No edema  MS: Adequate though reduced  ROM spine, shoulders, hips and knees.  Skin: Intact, no ulcerations or rash noted.  Psych: Good eye contact, flat  affect. Memory intact not anxious , depressed appearing.  CNS: CN 2-12 intact, power,  normal throughout.no  focal deficits noted.        Assessment & Plan:   Depression Start fluoxetine and trazodone Start daily exercise for 30 mins Attempt sooner psych appt, FMLA extended to return date after seeing psych Keep appt with therapist in 2 days  Stress at work Pt placed on FMLA an dis getting help with mental health as she addresses how she handles work stress as well as personal stress and mental health issues which may have been smoldering in the past ,but have been aggravated by current work situation as she has experienced it in the recent past  Morbid obesity with body mass index of 50.0-59.9 in adult Improved. Pt applauded on succesful weight loss through lifestyle change, and encouraged to continue same. Needs to start daily physical activity.   Essential hypertension Controlled, on lower dose of medication, continue same DASH diet and commitment to daily physical activity for a minimum of 30 minutes discussed and encouraged, as a part of hypertension management. The importance of attaining a healthy weight is also discussed.  BP/Weight 09/22/2014 09/03/2014 08/19/2014 07/08/2014 06/25/2014 06/23/2014 01/09/2692  Systolic BP 854 627 - - 035 - 009  Diastolic BP 80 80 - - 85 - 89  Wt. (Lbs) 243.12 247 258.5 280 - 293.13 293.4  BMI 44.46 45.17 48.87 52.93 - 55.41 55.47         GERD Controlled, no change in medication   Need for prophylactic vaccination and inoculation against influenza After obtaining informed consent,  the vaccine is  administered by LPN.   Insomnia secondary to depression with anxiety Sleep hygiene reviewed and written information offered also. Prescription sent for  medication needed.

## 2014-09-23 NOTE — Telephone Encounter (Signed)
Work note ready

## 2014-09-23 NOTE — Telephone Encounter (Signed)
Stephanie Cannon, Stephanie Cannon is returning your call from yesterday?

## 2014-09-24 ENCOUNTER — Telehealth: Payer: Self-pay | Admitting: Family Medicine

## 2014-09-24 NOTE — Telephone Encounter (Signed)
Patient is calling asking if she is supposed to keep her appointment today with Dr. Harrington Challenger, please advise?

## 2014-09-24 NOTE — Telephone Encounter (Signed)
Patient will see Stephanie Cannon today psychologist

## 2014-09-29 ENCOUNTER — Ambulatory Visit: Payer: BC Managed Care – PPO | Admitting: Dietician

## 2014-10-06 ENCOUNTER — Ambulatory Visit: Payer: BC Managed Care – PPO | Admitting: Family Medicine

## 2014-10-28 ENCOUNTER — Other Ambulatory Visit: Payer: Self-pay | Admitting: Family Medicine

## 2014-10-29 ENCOUNTER — Encounter (HOSPITAL_COMMUNITY): Payer: Self-pay | Admitting: Psychiatry

## 2014-10-29 ENCOUNTER — Ambulatory Visit (INDEPENDENT_AMBULATORY_CARE_PROVIDER_SITE_OTHER): Payer: BC Managed Care – PPO | Admitting: Psychiatry

## 2014-10-29 VITALS — BP 130/90 | Ht 62.0 in | Wt 237.0 lb

## 2014-10-29 DIAGNOSIS — F32A Depression, unspecified: Secondary | ICD-10-CM

## 2014-10-29 DIAGNOSIS — F329 Major depressive disorder, single episode, unspecified: Secondary | ICD-10-CM | POA: Diagnosis not present

## 2014-10-29 DIAGNOSIS — F411 Generalized anxiety disorder: Secondary | ICD-10-CM | POA: Diagnosis not present

## 2014-10-29 NOTE — Progress Notes (Signed)
Psychiatric Initial Adult Assessment   Patient Identification: Stephanie Cannon MRN:  [id number] Date of Evaluation:  10/29/2014 Referral Source: Dr. [name] Chief Complaint:   Chief Complaint    Depression; Anxiety; Establish Care     Visit Diagnosis:    ICD-9-CM ICD-10-CM   1. Depression 311 F32.9   2. Generalized anxiety disorder 300.02 F41.1    Diagnosis:   Patient Active Problem List   Diagnosis Date Noted  . Depression [F32.9] 09/22/2014  . Need for prophylactic vaccination and inoculation against influenza [Z23] 09/22/2014  . Insomnia secondary to depression with anxiety [F34.1, F51.05] 09/22/2014  . Stress at work [Z56.6] 09/03/2014  . Morbid obesity with body mass index of 50.0-59.9 in adult [facility]) [E66.01, Z68.43] 06/23/2014  . Spondylolisthesis of lumbar region [M43.16] 01/22/2014  . Urinary incontinence without sensory awareness [N39.42] 07/19/2013  . Accessory skin tags [Q82.8] 04/03/2013  . Prediabetes [R73.03] 12/17/2012  . Metabolic syndrome X [D17.61] 07/30/2012  . Diarrhea [R19.7] 07/11/2012  . Hoarseness of voice [R49.0] 07/11/2012  . Hot flashes due to surgical menopause [N95.8] 12/06/2011  . Anxiety [F41.9] 03/01/2011  . Hyperlipidemia LDL goal <100 [E78.5] 07/08/2010  . HEMORRHOIDS [K64.9] 04/27/2009  . FATIGUE [R53.81, R53.83] 01/23/2008  . CARPAL TUNNEL SYNDROME [G56.00] 06/26/2007  . Essential hypertension [I10] 06/26/2007  . GERD [K21.9] 06/26/2007  . Lumbar pain with radiation down left leg [M54.5] 06/26/2007   History of Present Illness:  This patient is a 44 year old married black female who lives with her husband in [place]. She has one 38 year old son lives on his own. 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  The patient was referred by her primary physician, Dr. [name], for further assessment treatment of depression and anxiety.  The  patient states that she's had a great deal of difficulty with job stress. She has been working in a front office at [employer]. Last school year and knew principal took over and brought a lot of staff from his previous school. She had a lot of demands put on her. 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 now is also supposed to handle assigning substitute teachers. She felt like she was always being criticized and could never do anything right and was been called into the principal's office all the time. She was getting increasingly stressed and anxious.  Over the summer she became more and more worried about returning to school and how stressful it would be. She talked to someone in the district office about it. When she got back to school she claims that the principal didn't even's beat to her or his talk to her except to constantly criticize her. She was brought into the office every day for some sort of criticism. After week of the she couldn't take it anymore and asked Dr. Moshe Cipro to take her out on medical leave.  Since this happened she is been very stressed. She spending most of her time trying to find other jobs. She feels as if the principal is set her up and is tried to force her out. He has put someone in her place of came from his old high school. She is concerned now that the only job opening is and [place] in it's too far for her to drive. She became increasingly depressed and worried anxious unable to sleep and having racing thoughts. Dr. Moshe Cipro just  put her on Prozac 20 mg and trazodone 50 mg about 3 weeks ago on the medicines are starting to help somewhat. She is also prescribed Xanax 1 mg as needed and she does take this occasionally. She denies being suicidal but is tearful today and upset about how things have happened at her workplace.  On the positive side she had lap band surgery last year and has lost about 70 pounds. She is trying to walk and  eat a healthy diet. Her husband son and parents are very supportive. She does not drink or use drugs and does not have psychotic symptoms or any previous psychiatric treatment Elements:  Location:  Global. Quality:  Moderate. Severity:  Moderate. Timing:  Daily. Duration:  Several months. Context:  Overwhelmed at work. Associated Signs/Symptoms: Depression Symptoms:  Anhedonia, low mood, psychomotor agitation, poor sleep racing thoughts Anxiety Symptoms:  Excessive Worry, Panic Symptoms,   Past Medical History:  Past Medical History  Diagnosis Date  . Obesity   . Hypertension   . GERD (gastroesophageal reflux disease)   . Chronic back pain   . Arthritis   . Hyperlipidemia   . Carpal tunnel syndrome     left, wears brace at night    Past Surgical History  Procedure Laterality Date  . Carpal tunnel release Right   . Cholecystectomy    . Tonsillectomy    . Cesarean section  1988  . Ovarian tumor  2005    ovaries removed   . Colonoscopy N/A 09/21/2012    Procedure: COLONOSCOPY;  Surgeon: Danie Binder, MD;  Location: [facility];  Service: Endoscopy;  Laterality: N/A;  9:30  . Lumbar fusion  01/22/2014    L4 L5       DR Arnoldo Morale  . Abdominal hysterectomy  2003    partial  . Laparoscopic gastric sleeve resection with hiatal hernia repair N/A 06/23/2014    Procedure: LAPAROSCOPIC LYSIS OF ADHESIONS, GASTRIC SLEEVE RESECTION AND UPPER ENDO;  Surgeon: Alphonsa Overall, MD;  Location: [facility] ORS;  Service: General;  Laterality: N/A;  . Laparoscopic lysis of adhesions  06/23/2014    Procedure: LAPAROSCOPIC LYSIS OF ADHESIONS;  Surgeon: Alphonsa Overall, MD;  Location: [facility];  Service: General;;  . Upper gi endoscopy  06/23/2014    Procedure: UPPER GI ENDOSCOPY;  Surgeon: Alphonsa Overall, MD;  Location: [facility];  Service: General;;   Family History:  Family History  Problem Relation Age of Onset  . Hypertension Mother   . Hypertension Father   . Hypertension Brother   . Cancer Maternal Aunt    . Cancer Maternal Uncle     liver  . Cancer Paternal Uncle     bone cancer  . Diabetes Maternal Grandmother   . Colon cancer Neg Hx    Social History:   Social History   Social History  . Marital Status: Married    Spouse Name: N/A  . Number of Children: N/A  . Years of Education: N/A   Occupational History  . [employer]    Social History Main Topics  . Smoking status: Former Smoker    Types: Cigarettes  . Smokeless tobacco: Never Used     Comment: quit smoking 2011  . Alcohol Use: Yes     Comment: occasionally  . Drug Use: No  . Sexual Activity: Not Asked   Other Topics Concern  . None   Social History Narrative   Additional Social History: The patient grew  up in [place] with both parents and one brother. She states that she had a good childhood and completed high school. She did get pregnant in high school at 68 and have her son. She has had some college courses. 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 She is married and has a supportive husband.  Musculoskeletal: Strength & Muscle Tone: within normal limits Gait & Station: normal Patient leans: N/A  Psychiatric Specialty Exam: HPI  Review of Systems  Musculoskeletal: Positive for back pain.  Neurological: Positive for tingling.  Psychiatric/Behavioral: Positive for depression. The patient is nervous/anxious and has insomnia.   All other systems reviewed and are negative.   Blood pressure 130/90, height 5\' 2"  (1.575 m), weight 237 lb (107.502 kg).Body mass index is 43.34 kg/(m^2).  General Appearance: Casual, Neat and Well Groomed  Eye Contact:  Good  Speech:  Clear and Coherent  Volume:  Normal  Mood:  Dysphoric  Affect:  Depressed and Tearful  Thought Process:  Goal Directed   Orientation:  Full (Time, Place, and Person)  Thought Content:  Rumination  Suicidal Thoughts:  No  Homicidal Thoughts:  No  Memory:  Immediate;   Good Recent;   Good Remote;   Good  Judgement:  Good  Insight:  Good  Psychomotor Activity:  Normal  Concentration:  Good  Recall:  Good  Fund of Knowledge:Good  Language: Good  Akathisia:  No  Handed:  Right  AIMS (if indicated):    Assets:  Communication Skills Desire for Improvement Resilience Social Support Talents/Skills  ADL's:  Intact  Cognition: WNL  Sleep:  Better with medication    Is the patient at risk to self?  No. Has the patient been a risk to self in the past 6 months?  No. Has the patient been a risk to self within the distant past?  No. Is the patient a risk to others?  No. Has the patient been a risk to others in the past 6 months?  No. Has the patient been a risk to others within the distant past?  No.  Allergies:   Allergies  Allergen Reactions  . Codeine Itching  . Morphine Itching   Current Medications: Current Outpatient Prescriptions  Medication Sig Dispense Refill  . ALPRAZolam (XANAX) 1 MG tablet TAKE (1) TABLET BY MOUTH AT BEDTIME AS NEEDED FOR SLEEP. 90 tablet 0  . Biotin w/ Vitamins C & E (HAIR SKIN & NAILS GUMMIES PO) Take 1 each by mouth 2 (two) times daily.    . cyclobenzaprine (FLEXERIL) 10 MG tablet TAKE 1 TABLET BY MOUTH AT BEDTIME. 90 tablet 0  . FLUoxetine (PROZAC) 20 MG tablet Take 1 tablet (20 mg total) by mouth daily. 30 tablet 3  . Multiple Vitamins-Minerals (MULTIVITAMIN GUMMIES ADULT PO) Take 1 each by mouth 2 (two) times daily.    Marland Kitchen NEXIUM 24HR 20 MG capsule Take 1 capsule (20 mg total) by mouth daily at 12 noon. (Patient taking differently: Take 20 mg by mouth every morning. ) 30 capsule 4  . potassium chloride (K-DUR) 10 MEQ tablet Take 1 tablet (10 mEq total) by mouth daily. 60 tablet 4  . traZODone (DESYREL) 50 MG tablet Take 0.5-1 tablets (25-50 mg total) by mouth at  bedtime as needed for sleep. 30 tablet 3  . triamterene-hydrochlorothiazide (MAXZIDE-25)  37.5-25 MG per tablet Take 1 tablet by mouth daily. 30 tablet 5   No current facility-administered medications for this visit.    Previous Psychotropic Medications: Yes   Substance Abuse History in the last 12 months:  No.  Consequences of Substance Abuse: NA  Medical Decision Making:  Established Problem, Stable/Improving (1), Review of Psycho-Social Stressors (1), Review or order clinical lab tests (1), Review and summation of old records (2) and Review of Medication Regimen & Side Effects (2)  Treatment Plan Summary: Medication management   Patient is a 44 year old black female who generally is been a very high functioning person. At this point she's been overwhelmed with stress on the job, feeling pushed out and worrying about her future. She was very recently started on Prozac and trazodone and I think she needs to continue these as well as Xanax for anxiety. She would benefit greatly from counseling and we will set this up today. She'll return to see me in 6 weeks to see how she is handling returning to work on November 11    Airi Copado, [facility] 10/26/201610:07 AM

## 2014-10-30 ENCOUNTER — Ambulatory Visit (HOSPITAL_COMMUNITY): Payer: BC Managed Care – PPO | Admitting: Psychiatry

## 2014-11-05 ENCOUNTER — Encounter: Payer: Self-pay | Admitting: Family Medicine

## 2014-11-05 ENCOUNTER — Ambulatory Visit (INDEPENDENT_AMBULATORY_CARE_PROVIDER_SITE_OTHER): Payer: BC Managed Care – PPO | Admitting: Family Medicine

## 2014-11-05 ENCOUNTER — Telehealth: Payer: Self-pay

## 2014-11-05 VITALS — BP 126/80 | HR 92 | Resp 18 | Ht 61.0 in | Wt 236.0 lb

## 2014-11-05 DIAGNOSIS — F341 Dysthymic disorder: Secondary | ICD-10-CM | POA: Diagnosis not present

## 2014-11-05 DIAGNOSIS — I10 Essential (primary) hypertension: Secondary | ICD-10-CM

## 2014-11-05 DIAGNOSIS — Z6841 Body Mass Index (BMI) 40.0 and over, adult: Secondary | ICD-10-CM

## 2014-11-05 DIAGNOSIS — K219 Gastro-esophageal reflux disease without esophagitis: Secondary | ICD-10-CM | POA: Diagnosis not present

## 2014-11-05 DIAGNOSIS — F5105 Insomnia due to other mental disorder: Secondary | ICD-10-CM

## 2014-11-05 DIAGNOSIS — F329 Major depressive disorder, single episode, unspecified: Secondary | ICD-10-CM | POA: Diagnosis not present

## 2014-11-05 DIAGNOSIS — F418 Other specified anxiety disorders: Secondary | ICD-10-CM

## 2014-11-05 DIAGNOSIS — F32A Depression, unspecified: Secondary | ICD-10-CM

## 2014-11-05 NOTE — Progress Notes (Signed)
Subjective:    Patient ID: Stephanie Cannon, female    DOB: 18-Nov-1970, 44 y.o.   MRN: [id number]  HPI   Stephanie Cannon     MRN: [id number]      DOB: Aug 06, 1970   HPI Stephanie Cannon is here for follow up and re-evaluation of chronic medical conditions, medication management and review of any available recent lab and radiology data.  Specific reason ios to re evaluate depression and anxiety , and to assess as far as ability to return to full time , unrestricted , employment. 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 Still contemplating psychotherapy, which I strongly recommend Preventive health is updated, specifically  Cancer screening and Immunization.   Questions or concerns regarding consultations or procedures which the PT has had in the interim are  addressed. The PT denies any adverse reactions to current medications since the last visit.  There are no new concerns.  There are no specific complaints   ROS Denies recent fever or chills. Denies sinus pressure, nasal congestion, ear pain or sore throat. Denies chest congestion, productive cough or wheezing. Denies chest pains, palpitations and leg swelling Denies abdominal pain, nausea, vomiting,diarrhea or constipation.   Denies dysuria, frequency, hesitancy or incontinence. Denies joint pain, swelling and limitation in mobility. Denies headaches, seizures, numbness, or tingling. Denies uncontrolled  depression, anxiety or insomnia. Denies skin break down or rash.   PE  BP 126/80 mmHg  Pulse 92  Resp 18  Ht 5\' 1"  (1.549 m)  Wt 236 lb (107.049 kg)  BMI 44.61 kg/m2  SpO2 100%  Patient alert and oriented and in no cardiopulmonary distress.  HEENT: No facial asymmetry, EOMI,   oropharynx pink and moist.  Neck supple no JVD, no mass.  Chest: Clear to auscultation  bilaterally.  CVS: S1, S2 no murmurs, no S3.Regular rate.  ABD: Soft non tender.   Ext: No edema  MS: Adequate ROM spine, shoulders, hips and knees.  Skin: Intact, no ulcerations or rash noted.  Psych: Good eye contact, normal affect. Memory intact not anxious or depressed appearing.  CNS: CN 2-12 intact, power,  normal throughout.no focal deficits noted.   Assessment & Plan   Depression Marked improvement with time away from work, finding a new job and with medication Still strongly encouraged to follow through on therapy which I believe that she does need, states she will consider seriously May resume full time employment with no restrictions  GERD Improved symptoms with weight loss, continue daily PPI  Insomnia secondary to depression with anxiety Adequately treated with trazodone , continue same Sleep hygiene also reviewed  Morbid obesity with body mass index of 50.0-59.9 in adult [facility]) Improved. Patient re-educated about  the importance of commitment to a  minimum of 150 minutes of exercise per week.  The importance of healthy food choices with portion control discussed. Encouraged to start a food diary, count calories and to consider  joining a support group. Sample diet sheets offered. Goals set by the patient for the next several months.   Weight /BMI 11/05/2014 09/22/2014 09/03/2014  WEIGHT 236 lb 243 lb 1.9 oz 247 lb  HEIGHT 5\' 1"  5\' 2"  5\' 2"   BMI 44.61 kg/m2 44.46 kg/m2 45.17 kg/m2  Some encounter information is confidential and restricted. Go to Review Flowsheets activity to see all data.  Current exercise per week 60 minutes.Encouraged to increase exercise commitment   Essential hypertension Controlled, no change in medication DASH diet and commitment to daily physical activity for a minimum of 30 minutes discussed and encouraged, as a part of hypertension management. The importance of attaining a healthy weight is also discussed.  BP/Weight  11/05/2014 09/22/2014 09/03/2014 08/19/2014 07/08/2014 06/25/2014 07/09/2374  Systolic BP 283 151 761 - - 607 -  Diastolic BP 80 80 80 - - 85 -  Wt. (Lbs) 236 243.12 247 258.5 280 - 293.13  BMI 44.61 44.46 45.17 48.87 52.93 - 55.41  Some encounter information is confidential and restricted. Go to Review Flowsheets activity to see all data.             Review of Systems     Objective:   Physical Exam        Assessment & Plan:

## 2014-11-05 NOTE — Patient Instructions (Addendum)
F/u in mid March, call if you need me sooner    No med changes  Thankful you are much better    Highly recommend therapy as a means of improving health moving forward  Congrats on weight loss  It is important that you exercise regularly at least 30 minutes 5 times a week. If you develop chest pain, have severe difficulty breathing, or feel very tired, stop exercising immediately and seek medical attention    Return to work with no restriction

## 2014-11-05 NOTE — Telephone Encounter (Signed)
Needs an OV to document and close off her prolonged absence, can move it up top today or tomorrow

## 2014-11-05 NOTE — Telephone Encounter (Signed)
Patient needs letter today.  Will work in at 42

## 2014-11-10 ENCOUNTER — Telehealth: Payer: Self-pay | Admitting: Family Medicine

## 2014-11-10 NOTE — Telephone Encounter (Signed)
Stephanie Cannon is asking to speak to [facility]

## 2014-11-10 NOTE — Telephone Encounter (Signed)
Spoke with patient and concern addressed on disability forms

## 2014-11-10 NOTE — Telephone Encounter (Signed)
Called and left message for patient to return call.  

## 2014-11-13 ENCOUNTER — Ambulatory Visit (HOSPITAL_COMMUNITY): Payer: BC Managed Care – PPO | Admitting: Psychiatry

## 2014-11-13 ENCOUNTER — Ambulatory Visit: Payer: BC Managed Care – PPO | Admitting: Family Medicine

## 2014-11-17 ENCOUNTER — Other Ambulatory Visit: Payer: Self-pay | Admitting: Family Medicine

## 2014-11-22 NOTE — Assessment & Plan Note (Signed)
Marked improvement with time away from work, finding a new job and with medication Still strongly encouraged to follow through on therapy which I believe that she does need, states she will consider seriously May resume full time employment with no restrictions

## 2014-11-22 NOTE — Assessment & Plan Note (Signed)
Adequately treated with trazodone , continue same Sleep hygiene also reviewed

## 2014-11-22 NOTE — Assessment & Plan Note (Signed)
Improved symptoms with weight loss, continue daily PPI

## 2014-11-22 NOTE — Assessment & Plan Note (Signed)
Improved. Patient re-educated about  the importance of commitment to a  minimum of 150 minutes of exercise per week.  The importance of healthy food choices with portion control discussed. Encouraged to start a food diary, count calories and to consider  joining a support group. Sample diet sheets offered. Goals set by the patient for the next several months.   Weight /BMI 11/05/2014 09/22/2014 09/03/2014  WEIGHT 236 lb 243 lb 1.9 oz 247 lb  HEIGHT 5\' 1"  5\' 2"  5\' 2"   BMI 44.61 kg/m2 44.46 kg/m2 45.17 kg/m2  Some encounter information is confidential and restricted. Go to Review Flowsheets activity to see all data.    Current exercise per week 60 minutes.Encouraged to increase exercise commitment

## 2014-11-22 NOTE — Assessment & Plan Note (Signed)
Controlled, no change in medication DASH diet and commitment to daily physical activity for a minimum of 30 minutes discussed and encouraged, as a part of hypertension management. The importance of attaining a healthy weight is also discussed.  BP/Weight 11/05/2014 09/22/2014 09/03/2014 08/19/2014 07/08/2014 06/25/2014 [id number]  Systolic BP 123XX123 [id number] A999333 - - [id number] -  Diastolic BP 80 80 80 - - 85 -  Wt. (Lbs) 236 243.12 247 258.5 280 - 293.13  BMI 44.61 44.46 45.17 48.87 52.93 - 55.41  Some encounter information is confidential and restricted. Go to Review Flowsheets activity to see all data.

## 2014-11-24 ENCOUNTER — Ambulatory Visit (HOSPITAL_COMMUNITY): Payer: Self-pay | Admitting: Psychiatry

## 2014-12-01 ENCOUNTER — Ambulatory Visit (INDEPENDENT_AMBULATORY_CARE_PROVIDER_SITE_OTHER): Payer: BC Managed Care – PPO

## 2014-12-01 VITALS — Wt 228.0 lb

## 2014-12-01 DIAGNOSIS — Z111 Encounter for screening for respiratory tuberculosis: Secondary | ICD-10-CM

## 2014-12-02 ENCOUNTER — Ambulatory Visit: Payer: Self-pay

## 2014-12-07 ENCOUNTER — Other Ambulatory Visit: Payer: Self-pay | Admitting: Family Medicine

## 2014-12-10 ENCOUNTER — Other Ambulatory Visit: Payer: Self-pay | Admitting: Family Medicine

## 2014-12-10 ENCOUNTER — Ambulatory Visit (HOSPITAL_COMMUNITY): Payer: Self-pay | Admitting: Psychiatry

## 2014-12-18 ENCOUNTER — Other Ambulatory Visit: Payer: Self-pay | Admitting: Family Medicine

## 2014-12-19 ENCOUNTER — Other Ambulatory Visit: Payer: Self-pay

## 2014-12-19 MED ORDER — NEXIUM 24HR 20 MG PO CPDR: DELAYED_RELEASE_CAPSULE | ORAL | Status: DC

## 2014-12-24 ENCOUNTER — Telehealth: Payer: Self-pay | Admitting: Family Medicine

## 2014-12-24 NOTE — Telephone Encounter (Signed)
Called and left voicemail on home #.

## 2014-12-24 NOTE — Telephone Encounter (Signed)
Stephanie Cannon is requesting a medication refill on her Naproxen, please advise?

## 2014-12-24 NOTE — Telephone Encounter (Signed)
Patient is asking for a refill for back pain.  Is not taking other NSAIDs otc.  Please advise if refill is ok for prn use.

## 2014-12-24 NOTE — Telephone Encounter (Signed)
Th gastric surgery, NSAIDS not recommended, I recommend tylenol 500 mg one up to 3 times daily

## 2015-01-05 ENCOUNTER — Other Ambulatory Visit: Payer: Self-pay | Admitting: Family Medicine

## 2015-02-19 ENCOUNTER — Ambulatory Visit (INDEPENDENT_AMBULATORY_CARE_PROVIDER_SITE_OTHER): Payer: Self-pay | Admitting: Otolaryngology

## 2015-03-16 ENCOUNTER — Telehealth: Payer: Self-pay | Admitting: Family Medicine

## 2015-03-16 ENCOUNTER — Telehealth: Payer: Self-pay

## 2015-03-16 ENCOUNTER — Ambulatory Visit (INDEPENDENT_AMBULATORY_CARE_PROVIDER_SITE_OTHER): Payer: BLUE CROSS/BLUE SHIELD

## 2015-03-16 DIAGNOSIS — N3000 Acute cystitis without hematuria: Secondary | ICD-10-CM

## 2015-03-16 LAB — POCT URINALYSIS DIPSTICK
BILIRUBIN UA: NEGATIVE
Blood, UA: NEGATIVE
Glucose, UA: NEGATIVE
KETONES UA: NEGATIVE
LEUKOCYTES UA: NEGATIVE
NITRITE UA: NEGATIVE
Protein, UA: NEGATIVE
Spec Grav, UA: 1.025
Urobilinogen, UA: 0.2
pH, UA: 7

## 2015-03-16 MED ORDER — PREDNISONE 5 MG PO TABS: 5.0000 mg | ORAL_TABLET | Freq: Two times a day (BID) | ORAL | Status: AC

## 2015-03-16 NOTE — Telephone Encounter (Signed)
Patient is asking for a return call from the nurse

## 2015-03-16 NOTE — Progress Notes (Signed)
Patient in c/o flank pain, urine frequency, and urine odor x 4 days.  Urine checked in office with normal results.   Telephone call sent to MD for more advice.

## 2015-03-16 NOTE — Telephone Encounter (Signed)
Will send 5 day course of med, with bypass, will not prescribe the ibuprofen, let her know

## 2015-03-16 NOTE — Telephone Encounter (Signed)
Coming in for a nurse visit today to check her urine

## 2015-03-16 NOTE — Telephone Encounter (Signed)
Patient has urinary symptoms x 4 days.  Flank pain, frequency, and urine odor.  Will come in this afternoon for nurse visit.

## 2015-03-16 NOTE — Telephone Encounter (Signed)
Called pt no answer, left message.

## 2015-03-17 NOTE — Telephone Encounter (Signed)
Patient aware and collected medication

## 2015-04-06 ENCOUNTER — Other Ambulatory Visit: Payer: Self-pay | Admitting: Family Medicine

## 2015-04-10 ENCOUNTER — Other Ambulatory Visit: Payer: Self-pay

## 2015-04-10 MED ORDER — ALPRAZOLAM 1 MG PO TABS: ORAL_TABLET | ORAL | Status: DC

## 2015-06-04 ENCOUNTER — Ambulatory Visit (INDEPENDENT_AMBULATORY_CARE_PROVIDER_SITE_OTHER): Payer: BLUE CROSS/BLUE SHIELD | Admitting: Otolaryngology

## 2015-06-04 DIAGNOSIS — H9209 Otalgia, unspecified ear: Secondary | ICD-10-CM

## 2015-06-26 ENCOUNTER — Telehealth: Payer: Self-pay

## 2015-06-26 DIAGNOSIS — R7303 Prediabetes: Secondary | ICD-10-CM

## 2015-06-26 DIAGNOSIS — E785 Hyperlipidemia, unspecified: Secondary | ICD-10-CM

## 2015-06-26 DIAGNOSIS — I10 Essential (primary) hypertension: Secondary | ICD-10-CM

## 2015-06-26 NOTE — Telephone Encounter (Signed)
-----   Message from Fayrene Helper, MD sent at 06/22/2015 10:02 AM EDT ----- Regarding: needs OV No refills on meds, xanax will be out in July pls contact pt. Last here in 11/2014  Needs fasting lipid, cmp, hBa1C, tSh and CBC prior to visit

## 2015-06-26 NOTE — Telephone Encounter (Signed)
Called and left message for patient notifying that she will need to call and schedule an office visit.  Labs ordered and mailed to home address.

## 2015-07-06 ENCOUNTER — Other Ambulatory Visit: Payer: Self-pay

## 2015-07-06 ENCOUNTER — Other Ambulatory Visit: Payer: Self-pay | Admitting: Family Medicine

## 2015-07-06 MED ORDER — ALPRAZOLAM 1 MG PO TABS: ORAL_TABLET | ORAL | Status: DC

## 2015-07-08 ENCOUNTER — Ambulatory Visit: Payer: Self-pay | Admitting: Family Medicine

## 2015-07-29 ENCOUNTER — Ambulatory Visit (INDEPENDENT_AMBULATORY_CARE_PROVIDER_SITE_OTHER): Payer: BLUE CROSS/BLUE SHIELD | Admitting: Family Medicine

## 2015-07-29 ENCOUNTER — Encounter: Payer: Self-pay | Admitting: Family Medicine

## 2015-07-29 VITALS — BP 130/82 | HR 84 | Resp 16 | Ht 61.0 in | Wt 223.0 lb

## 2015-07-29 DIAGNOSIS — Z114 Encounter for screening for human immunodeficiency virus [HIV]: Secondary | ICD-10-CM

## 2015-07-29 DIAGNOSIS — I10 Essential (primary) hypertension: Secondary | ICD-10-CM

## 2015-07-29 DIAGNOSIS — G4700 Insomnia, unspecified: Secondary | ICD-10-CM

## 2015-07-29 DIAGNOSIS — M545 Low back pain, unspecified: Secondary | ICD-10-CM

## 2015-07-29 DIAGNOSIS — M79605 Pain in left leg: Secondary | ICD-10-CM

## 2015-07-29 DIAGNOSIS — Z6841 Body Mass Index (BMI) 40.0 and over, adult: Secondary | ICD-10-CM

## 2015-07-29 DIAGNOSIS — J320 Chronic maxillary sinusitis: Secondary | ICD-10-CM

## 2015-07-29 MED ORDER — LEVOFLOXACIN 500 MG PO TABS: 500.0000 mg | ORAL_TABLET | Freq: Every day | ORAL | 0 refills | Status: DC

## 2015-07-29 NOTE — Patient Instructions (Signed)
F/u in 6 month, call if you need me before  PLEASE schedule and get mammogram , this is due  CONGRATS on 70 pound weight loss in past 1 year  Weight loss goal of 12 to 15 pounds   Levaquin prescribed for 1 week  For sinus infection  Please work on good  health habits so that your health will improve. 1. Commitment to daily physical activity for 30 to 60  minutes, if you are able to do this.  2. Commitment to wise food choices. Aim for half of your  food intake to be vegetable and fruit, one quarter starchy foods, and one quarter protein. Try to eat on a regular schedule  3 meals per day, snacking between meals should be limited to vegetables or fruits or small portions of nuts. 64 ounces of water per day is generally recommended, unless you have specific health conditions, like heart failure or kidney failure where you will need to limit fluid intake.  3. Commitment to sufficient and a  good quality of physical and mental rest daily, generally between 6 to 8 hours per day.  WITH PERSISTANCE AND PERSEVERANCE, THE IMPOSSIBLE , BECOMES THE NORM! Thank you  for choosing [facility]. We consider it a privelige to serve you.  Delivering excellent health care in a caring and  compassionate way is our goal.  Partnering with you,  so that together we can achieve this goal is our strategy.

## 2015-07-29 NOTE — Assessment & Plan Note (Addendum)
1 week h/o left maxillary pain and pressure with green drainage, antibiotic prescribed

## 2015-08-02 ENCOUNTER — Encounter: Payer: Self-pay | Admitting: Family Medicine

## 2015-08-02 NOTE — Assessment & Plan Note (Signed)
Sleep hygiene reviewed and written information offered also. Prescription sent for  medication needed.  

## 2015-08-02 NOTE — Assessment & Plan Note (Signed)
Controlled, no change in medication DASH diet and commitment to daily physical activity for a minimum of 30 minutes discussed and encouraged, as a part of hypertension management. The importance of attaining a healthy weight is also discussed.  BP/Weight 07/29/2015 12/01/2014 11/05/2014 09/22/2014 09/03/2014 [id number]  Systolic BP [id number] - 123XX123 A999333 A999333 - -  Diastolic BP 82 - 80 80 80 - -  Wt. (Lbs) 223 228 236 243.12 247 258.5 280  BMI 42.14 43.1 44.61 44.46 45.17 48.87 52.93  Some encounter information is confidential and restricted. Go to Review Flowsheets activity to see all data.

## 2015-08-02 NOTE — Assessment & Plan Note (Signed)
Improved following spine surgery as well as weight loss, flexeril at bedtime only Back exercise and ongoing weight loss will help

## 2015-08-02 NOTE — Progress Notes (Signed)
   Stephanie Cannon     MRN: [id number]      DOB: Aug 07, 1970   HPI Stephanie Cannon is here for follow up and re-evaluation of chronic medical conditions, medication management and review of any available recent lab and radiology data.  Preventive health is updated, specifically  Cancer screening and Immunization.   Questions or concerns regarding consultations or procedures which the PT has had in the interim are  addressed. The PT denies any adverse reactions to current medications since the last visit.  3 week h/o sinus pressure with green drainage and scant cough, intermittent chills and generalized body aches, worsening  ROS Denies chest congestion, productive cough or wheezing. Denies chest pains, palpitations and leg swelling Denies abdominal pain, nausea, vomiting,diarrhea or constipation.   Denies dysuria, frequency, hesitancy or incontinence. Denies joint pain, swelling and limitation in mobility. Denies headaches, seizures, numbness, or tingling. Denies depression, anxiety or insomnia. Denies skin break down or rash.   PE  BP 130/82 (BP Location: Left Arm, Patient Position: Sitting, Cuff Size: Large)   Pulse 84   Resp 16   Ht 5\' 1"  (1.549 m)   Wt 223 lb (101.2 kg)   SpO2 97%   BMI 42.14 kg/m   Patient alert and oriented and in no cardiopulmonary distress.  HEENT: No facial asymmetry, EOMI,   oropharynx pink and moist.  Neck supple no JVD, no mass.Left Maxillary sinus tenderness, TM clear  Chest: Clear to auscultation bilaterally.  CVS: S1, S2 no murmurs, no S3.Regular rate.  ABD: Soft non tender.   Ext: No edema  MS: Adequate ROM spine, shoulders, hips and knees.  Skin: Intact, no ulcerations or rash noted.  Psych: Good eye contact, normal affect. Memory intact not anxious or depressed appearing.  CNS: CN 2-12 intact, power,  normal throughout.no focal deficits noted.   Assessment & Plan Left maxillary sinusitis 1 week h/o left maxillary pain and pressure  with green drainage, antibiotic prescribed  Insomnia Sleep hygiene reviewed and written information offered also. Prescription sent for  medication needed.   Morbid obesity with body mass index of 50.0-59.9 in adult [facility]) Improved. Patient re-educated about  the importance of commitment to a  minimum of 150 minutes of exercise per week.  The importance of healthy food choices with portion control discussed. Encouraged to start a food diary, count calories and to consider  joining a support group. Sample diet sheets offered. Goals set by the patient for the next several months.   Weight /BMI 07/29/2015 12/01/2014 11/05/2014  WEIGHT 223 lb 228 lb 236 lb  HEIGHT 5\' 1"  - 5\' 1"   BMI 42.14 kg/m2 43.1 kg/m2 44.61 kg/m2  Some encounter information is confidential and restricted. Go to Review Flowsheets activity to see all data.      Essential hypertension Controlled, no change in medication DASH diet and commitment to daily physical activity for a minimum of 30 minutes discussed and encouraged, as a part of hypertension management. The importance of attaining a healthy weight is also discussed.  BP/Weight 07/29/2015 12/01/2014 11/05/2014 09/22/2014 09/03/2014 [id number] [id number]  Systolic BP [id number] - 123XX123 A999333 A999333 - -  Diastolic BP 82 - 80 80 80 - -  Wt. (Lbs) 223 228 236 243.12 247 258.5 280  BMI 42.14 43.1 44.61 44.46 45.17 48.87 52.93  Some encounter information is confidential and restricted. Go to Review Flowsheets activity to see all data.

## 2015-08-02 NOTE — Assessment & Plan Note (Addendum)
Improved. Patient re-educated about  the importance of commitment to a  minimum of 150 minutes of exercise per week.  The importance of healthy food choices with portion control discussed. Encouraged to start a food diary, count calories and to consider  joining a support group. Sample diet sheets offered. Goals set by the patient for the next several months.   Weight /BMI 07/29/2015 12/01/2014 11/05/2014  WEIGHT 223 lb 228 lb 236 lb  HEIGHT 5\' 1"  - 5\' 1"   BMI 42.14 kg/m2 43.1 kg/m2 44.61 kg/m2  Some encounter information is confidential and restricted. Go to Review Flowsheets activity to see all data.

## 2015-08-04 ENCOUNTER — Other Ambulatory Visit: Payer: Self-pay | Admitting: Family Medicine

## 2015-08-17 ENCOUNTER — Telehealth: Payer: Self-pay | Admitting: Family Medicine

## 2015-08-17 ENCOUNTER — Other Ambulatory Visit: Payer: Self-pay

## 2015-08-17 DIAGNOSIS — R52 Pain, unspecified: Secondary | ICD-10-CM

## 2015-08-17 MED ORDER — CYCLOBENZAPRINE HCL 10 MG PO TABS: 10.0000 mg | ORAL_TABLET | Freq: Every day | ORAL | 0 refills | Status: DC

## 2015-08-17 NOTE — Telephone Encounter (Signed)
Med refilled. Wanted to know if you would order the blood test for rheumatoid arthritis?

## 2015-08-17 NOTE — Telephone Encounter (Signed)
Stephanie Cannon is asking if she can get a refill on her cyclobenzaprine (FLEXERIL) 10 MG table and also she is asking if Dr. Moshe Cipro would have her tested for Rheumatoid Arthritis please advise?

## 2015-08-17 NOTE — Telephone Encounter (Signed)
She has no small joint like finger deformity like people with rheumatoid arthritis, a order can be placed for rheumatoid factor if she really wants test done, that is the only one test I wil order, if abn, then I recommend referral to rheumatologist for complete eval

## 2015-08-18 ENCOUNTER — Other Ambulatory Visit: Payer: Self-pay

## 2015-08-18 DIAGNOSIS — R52 Pain, unspecified: Secondary | ICD-10-CM

## 2015-08-18 NOTE — Telephone Encounter (Signed)
Patient aware and test ordered per request

## 2015-09-08 ENCOUNTER — Other Ambulatory Visit: Payer: Self-pay | Admitting: Family Medicine

## 2015-09-08 DIAGNOSIS — Z1231 Encounter for screening mammogram for malignant neoplasm of breast: Secondary | ICD-10-CM

## 2015-09-09 ENCOUNTER — Ambulatory Visit (HOSPITAL_COMMUNITY): Payer: Self-pay

## 2015-09-14 ENCOUNTER — Encounter: Payer: Self-pay | Admitting: Family Medicine

## 2015-09-14 ENCOUNTER — Ambulatory Visit (INDEPENDENT_AMBULATORY_CARE_PROVIDER_SITE_OTHER): Payer: BLUE CROSS/BLUE SHIELD | Admitting: Family Medicine

## 2015-09-14 VITALS — BP 120/82 | HR 90 | Resp 18 | Ht 61.0 in | Wt 229.0 lb

## 2015-09-14 DIAGNOSIS — Z23 Encounter for immunization: Secondary | ICD-10-CM | POA: Diagnosis not present

## 2015-09-14 DIAGNOSIS — E785 Hyperlipidemia, unspecified: Secondary | ICD-10-CM

## 2015-09-14 DIAGNOSIS — I10 Essential (primary) hypertension: Secondary | ICD-10-CM | POA: Diagnosis not present

## 2015-09-14 DIAGNOSIS — M545 Low back pain, unspecified: Secondary | ICD-10-CM

## 2015-09-14 DIAGNOSIS — G4700 Insomnia, unspecified: Secondary | ICD-10-CM

## 2015-09-14 DIAGNOSIS — Z114 Encounter for screening for human immunodeficiency virus [HIV]: Secondary | ICD-10-CM

## 2015-09-14 DIAGNOSIS — Z6841 Body Mass Index (BMI) 40.0 and over, adult: Secondary | ICD-10-CM

## 2015-09-14 DIAGNOSIS — R7303 Prediabetes: Secondary | ICD-10-CM

## 2015-09-14 MED ORDER — TRAMADOL HCL 50 MG PO TABS: 50.0000 mg | ORAL_TABLET | Freq: Two times a day (BID) | ORAL | 0 refills | Status: DC | PRN

## 2015-09-14 MED ORDER — KETOROLAC TROMETHAMINE 60 MG/2ML IM SOLN
60.0000 mg | Freq: Once | INTRAMUSCULAR | Status: AC
  Administered 2015-09-14: 60 mg via INTRAMUSCULAR

## 2015-09-14 MED ORDER — CYCLOBENZAPRINE HCL 10 MG PO TABS: ORAL_TABLET | ORAL | 3 refills | Status: DC

## 2015-09-14 MED ORDER — METHYLPREDNISOLONE ACETATE 80 MG/ML IJ SUSP
80.0000 mg | Freq: Once | INTRAMUSCULAR | Status: AC
  Administered 2015-09-14: 80 mg via INTRAMUSCULAR

## 2015-09-14 MED ORDER — HYDROXYZINE HCL 50 MG PO TABS: ORAL_TABLET | ORAL | 0 refills | Status: DC

## 2015-09-14 NOTE — Assessment & Plan Note (Signed)
After obtaining informed consent, the vaccine is  administered by LPN.  

## 2015-09-14 NOTE — Assessment & Plan Note (Signed)
Updated lab needed at/ before next visit. Patient educated about the importance of limiting  Carbohydrate intake , the need to commit to daily physical activity for a minimum of 30 minutes , and to commit weight loss. The fact that changes in all these areas will reduce or eliminate all together the development of diabetes is stressed.   Diabetic Labs Latest Ref Rng & Units 09/03/2014 06/18/2014 01/23/2014 01/16/2014 12/23/2013  HbA1c <5.7 % - - - - 5.8(H)  Chol 0 - 200 mg/dL - - - - 202(H)  HDL >39 mg/dL - - - - 53  Calc LDL 0 - 99 mg/dL - - - - 128(H)  Triglycerides <150 mg/dL - - - - 105  Creatinine 0.44 - 1.00 mg/dL 0.88 0.82 0.81 0.80 0.79   BP/Weight 09/14/2015 07/29/2015 12/01/2014 11/05/2014 09/22/2014 09/03/2014 99991111  Systolic BP 123456 [id number] - 123XX123 A999333 A999333 -  Diastolic BP 82 82 - 80 80 80 -  Wt. (Lbs) 229 223 228 236 243.12 247 258.5  BMI 43.27 42.14 43.1 44.61 44.46 45.17 48.87  Some encounter information is confidential and restricted. Go to Review Flowsheets activity to see all data.   No flowsheet data found.

## 2015-09-14 NOTE — Assessment & Plan Note (Signed)
Deteriorated. Patient re-educated about  the importance of commitment to a  minimum of 150 minutes of exercise per week.  The importance of healthy food choices with portion control discussed. Encouraged to start a food diary, count calories and to consider  joining a support group. Sample diet sheets offered. Goals set by the patient for the next several months.   Weight /BMI 09/14/2015 07/29/2015 12/01/2014  WEIGHT 229 lb 223 lb 228 lb  HEIGHT 5\' 1"  5\' 1"  -  BMI 43.27 kg/m2 42.14 kg/m2 43.1 kg/m2  Some encounter information is confidential and restricted. Go to Review Flowsheets activity to see all data.

## 2015-09-14 NOTE — Assessment & Plan Note (Signed)
Uncontrolled.Toradol and depo medrol administered IM in the office , 

## 2015-09-14 NOTE — Progress Notes (Signed)
Stephanie Cannon     MRN: [id number]      DOB: [birth year]   HPI Stephanie Cannon is here wit 4 week h/o uncontrolled low back pain, none radiaiting, no specific aggravating factor, no  Lower ext weakness or numbness or new incontinence. Pain is over 10 at night, and a 7 during the day, now sleeping on a recliner as lying down makes pain worse  Also seen for  for follow up and re-evaluation of chronic medical conditions, medication management and review of any available recent lab and radiology data.  Preventive health is updated, specifically  Cancer screening and Immunization.   . The PT denies any adverse reactions to current medications since the last visit.    ROS Denies recent fever or chills. Denies sinus pressure, nasal congestion, ear pain or sore throat. Denies chest congestion, productive cough or wheezing. Denies chest pains, palpitations and leg swelling Denies abdominal pain, nausea, vomiting,diarrhea or constipation.   Denies dysuria, frequency, hesitancy or incontinence.  Denies headaches, seizures, numbness, or tingling. Denies depression, anxiety . Denies skin break down or rash.   PE  BP 120/82   Pulse 90   Resp 18   Ht 5\' 1"  (1.549 m)   Wt 229 lb (103.9 kg)   SpO2 99%   BMI 43.27 kg/m   Patient alert and oriented and in no cardiopulmonary distress.  HEENT: No facial asymmetry, EOMI,   oropharynx pink and moist.  Neck supple no JVD, no mass.  Chest: Clear to auscultation bilaterally.  CVS: S1, S2 no murmurs, no S3.Regular rate.  ABD: Soft non tender.   Ext: No edema  MS: Adequate  though reduced  ROM spine, normal in  shoulders, hips and knees.  Skin: Intact, no ulcerations or rash noted.  Psych: Good eye contact, normal affect. Memory intact not anxious or depressed appearing.  CNS: CN 2-12 intact, power,  normal throughout.no focal deficits noted.   Assessment & Plan  Essential hypertension Controlled, no change in medication DASH diet and  commitment to daily physical activity for a minimum of 30 minutes discussed and encouraged, as a part of hypertension management. The importance of attaining a healthy weight is also discussed.  BP/Weight 09/14/2015 07/29/2015 12/01/2014 11/05/2014 09/22/2014 09/03/2014 99991111  Systolic BP 123456 [id number] - 123XX123 A999333 A999333 -  Diastolic BP 82 82 - 80 80 80 -  Wt. (Lbs) 229 223 228 236 243.12 247 258.5  BMI 43.27 42.14 43.1 44.61 44.46 45.17 48.87  Some encounter information is confidential and restricted. Go to Review Flowsheets activity to see all data.       Lumbar pain Uncontrolled.Toradol and depo medrol administered IM in the office ,   Need for prophylactic vaccination and inoculation against influenza After obtaining informed consent, the vaccine is  administered by LPN.   Prediabetes Updated lab needed at/ before next visit. Patient educated about the importance of limiting  Carbohydrate intake , the need to commit to daily physical activity for a minimum of 30 minutes , and to commit weight loss. The fact that changes in all these areas will reduce or eliminate all together the development of diabetes is stressed.   Diabetic Labs Latest Ref Rng & Units 09/03/2014 06/18/2014 01/23/2014 01/16/2014 12/23/2013  HbA1c <5.7 % - - - - 5.8(H)  Chol 0 - 200 mg/dL - - - - 202(H)  HDL >39 mg/dL - - - - 53  Calc LDL 0 - 99 mg/dL - - - - 128(H)  Triglycerides <  150 mg/dL - - - - 105  Creatinine 0.44 - 1.00 mg/dL 0.88 0.82 0.81 0.80 0.79   BP/Weight 09/14/2015 07/29/2015 12/01/2014 11/05/2014 09/22/2014 09/03/2014 99991111  Systolic BP 123456 [id number] - 123XX123 A999333 A999333 -  Diastolic BP 82 82 - 80 80 80 -  Wt. (Lbs) 229 223 228 236 243.12 247 258.5  BMI 43.27 42.14 43.1 44.61 44.46 45.17 48.87  Some encounter information is confidential and restricted. Go to Review Flowsheets activity to see all data.   No flowsheet data found.    Morbid obesity with body mass index of 50.0-59.9 in adult  [facility]) Deteriorated. Patient re-educated about  the importance of commitment to a  minimum of 150 minutes of exercise per week.  The importance of healthy food choices with portion control discussed. Encouraged to start a food diary, count calories and to consider  joining a support group. Sample diet sheets offered. Goals set by the patient for the next several months.   Weight /BMI 09/14/2015 07/29/2015 12/01/2014  WEIGHT 229 lb 223 lb 228 lb  HEIGHT 5\' 1"  5\' 1"  -  BMI 43.27 kg/m2 42.14 kg/m2 43.1 kg/m2  Some encounter information is confidential and restricted. Go to Review Flowsheets activity to see all data.      Insomnia Uncontrolled due to pain, start tramdol as needed at bedtime and add hydroxyzine also

## 2015-09-14 NOTE — Assessment & Plan Note (Signed)
Controlled, no change in medication DASH diet and commitment to daily physical activity for a minimum of 30 minutes discussed and encouraged, as a part of hypertension management. The importance of attaining a healthy weight is also discussed.  BP/Weight 09/14/2015 07/29/2015 12/01/2014 11/05/2014 09/22/2014 09/03/2014 99991111  Systolic BP 123456 [id number] - 123XX123 A999333 A999333 -  Diastolic BP 82 82 - 80 80 80 -  Wt. (Lbs) 229 223 228 236 243.12 247 258.5  BMI 43.27 42.14 43.1 44.61 44.46 45.17 48.87  Some encounter information is confidential and restricted. Go to Review Flowsheets activity to see all data.

## 2015-09-14 NOTE — Assessment & Plan Note (Signed)
Uncontrolled due to pain, start tramdol as needed at bedtime and add hydroxyzine also

## 2015-09-14 NOTE — Patient Instructions (Addendum)
F/u in 4 month, call if you need me sooner  Flu vaccine today  Injections in office today for back pain and tramadol, and hydroxyzine prescribed new, may increase dose of flexeril to twice daily  Fasting  Labs tomorrow please

## 2015-09-15 LAB — CBC
HCT: 42.7 % (ref 35.0–45.0)
HEMOGLOBIN: 14.3 g/dL (ref 11.7–15.5)
MCH: 30.1 pg (ref 27.0–33.0)
MCHC: 33.5 g/dL (ref 32.0–36.0)
MCV: 89.9 fL (ref 80.0–100.0)
MPV: 10.2 fL (ref 7.5–12.5)
Platelets: 245 10*3/uL (ref 140–400)
RBC: 4.75 MIL/uL (ref 3.80–5.10)
RDW: 13.8 % (ref 11.0–15.0)
WBC: 6 10*3/uL (ref 3.8–10.8)

## 2015-09-15 LAB — COMPREHENSIVE METABOLIC PANEL
ALT: 15 U/L (ref 6–29)
AST: 18 U/L (ref 10–35)
Albumin: 3.9 g/dL (ref 3.6–5.1)
Alkaline Phosphatase: 76 U/L (ref 33–115)
BUN: 20 mg/dL (ref 7–25)
CHLORIDE: 103 mmol/L (ref 98–110)
CO2: 30 mmol/L (ref 20–31)
CREATININE: 0.84 mg/dL (ref 0.50–1.10)
Calcium: 9.5 mg/dL (ref 8.6–10.2)
Glucose, Bld: 82 mg/dL (ref 65–99)
Potassium: 4.1 mmol/L (ref 3.5–5.3)
SODIUM: 140 mmol/L (ref 135–146)
Total Bilirubin: 0.6 mg/dL (ref 0.2–1.2)
Total Protein: 7 g/dL (ref 6.1–8.1)

## 2015-09-15 LAB — LIPID PANEL
CHOL/HDL RATIO: 3.1 ratio (ref <=5.0)
Cholesterol: 194 mg/dL (ref 125–200)
HDL: 63 mg/dL (ref >=46)
LDL CALC: 119 mg/dL (ref <130)
Triglycerides: 61 mg/dL (ref <150)
VLDL: 12 mg/dL (ref <30)

## 2015-09-16 ENCOUNTER — Ambulatory Visit (HOSPITAL_COMMUNITY)
Admission: RE | Admit: 2015-09-16 | Discharge: 2015-09-16 | Disposition: A | Discharge status: Home / Self Care | Payer: BLUE CROSS/BLUE SHIELD | Source: Ambulatory Visit | Attending: Family Medicine | Admitting: Family Medicine

## 2015-09-16 ENCOUNTER — Encounter: Payer: Self-pay | Admitting: Family Medicine

## 2015-09-16 DIAGNOSIS — Z1231 Encounter for screening mammogram for malignant neoplasm of breast: Secondary | ICD-10-CM | POA: Diagnosis not present

## 2015-09-16 LAB — RHEUMATOID FACTOR: Rhuematoid fact SerPl-aCnc: <14 IU/mL (ref <14)

## 2015-09-16 LAB — HIV ANTIBODY (ROUTINE TESTING W REFLEX): HIV: NONREACTIVE

## 2015-09-16 LAB — HEMOGLOBIN A1C
Hgb A1c MFr Bld: 5.2 % (ref <5.7)
MEAN PLASMA GLUCOSE: 103 mg/dL

## 2015-09-16 LAB — TSH: TSH: 2.74 mIU/L

## 2015-10-02 ENCOUNTER — Other Ambulatory Visit: Payer: Self-pay | Admitting: Family Medicine

## 2015-10-29 ENCOUNTER — Telehealth: Payer: Self-pay | Admitting: Family Medicine

## 2015-10-29 NOTE — Telephone Encounter (Signed)
Stephanie Cannon is asking if Dr. Moshe Cipro for a Rx Phentermene for weight loss, please advise?

## 2015-10-29 NOTE — Telephone Encounter (Signed)
Please advise 

## 2015-10-30 ENCOUNTER — Other Ambulatory Visit: Payer: Self-pay | Admitting: Family Medicine

## 2015-10-30 MED ORDER — PHENTERMINE HCL 37.5 MG PO TABS: 37.5000 mg | ORAL_TABLET | Freq: Every day | ORAL | 1 refills | Status: DC

## 2015-10-30 NOTE — Telephone Encounter (Signed)
Will ok phentermine one daily starting next week, script will be available on Monday, 2 months only, needs OV in 7 weeks, goal is 1 pound weight loss per week, stress importance of food choice and exercise please I will proint and it will be ready to be collected on Monday next week, pls let her know

## 2015-11-06 NOTE — Telephone Encounter (Signed)
Pt collected and has a 7 week appt

## 2015-11-16 ENCOUNTER — Telehealth: Payer: Self-pay

## 2015-11-16 NOTE — Telephone Encounter (Signed)
I recommend stopping xanax , since of no benefit. Send info on sleep hygiene and I recommend tylenol PM one at bedtime. [place] on sleep hygiene, will need OV if after 2 to 3 weeks still an issue

## 2015-11-16 NOTE — Telephone Encounter (Signed)
Called and left message for patient to return call.  

## 2015-11-18 NOTE — Telephone Encounter (Signed)
Patient aware.  Would like to continue Xanax for now.  Will mail out sleep hygiene info.  Will call for ov if problem persists.

## 2015-12-16 ENCOUNTER — Other Ambulatory Visit: Payer: Self-pay

## 2015-12-16 MED ORDER — ALPRAZOLAM 1 MG PO TABS: ORAL_TABLET | ORAL | 0 refills | Status: DC

## 2016-01-04 HISTORY — PX: BREAST BIOPSY: SHX20

## 2016-01-08 ENCOUNTER — Other Ambulatory Visit: Payer: Self-pay

## 2016-01-08 ENCOUNTER — Other Ambulatory Visit: Payer: Self-pay | Admitting: Family Medicine

## 2016-01-08 MED ORDER — HYDROXYZINE HCL 50 MG PO TABS: ORAL_TABLET | ORAL | 0 refills | Status: DC

## 2016-01-11 ENCOUNTER — Other Ambulatory Visit: Payer: Self-pay

## 2016-01-11 MED ORDER — NEXIUM 24HR 20 MG PO CPDR: DELAYED_RELEASE_CAPSULE | ORAL | 1 refills | Status: DC

## 2016-01-14 IMAGING — MR MR LUMBAR SPINE W/O CM
4 of 5 series · 17 of 48 positions shown · non-contrast
Comparison: 11/15/2006

CLINICAL DATA: Worsening back pain with left leg weakness, left
thigh numbness, and urinary incontinence.

EXAM:
MRI LUMBAR SPINE WITHOUT CONTRAST
TECHNIQUE: Multiplanar, multisequence MR imaging of the lumbar spine was
performed. No intravenous contrast was administered.

[Series 2: T1 · sagittal · 4.0mm · 0.84mm/px · 3 of 14 slices shown (1 of 2)]
[im 3/14]
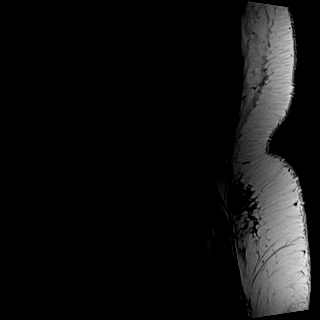
[im 7/14]
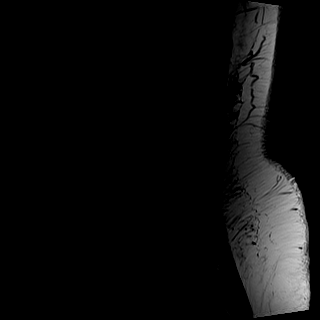
[im 11/14]
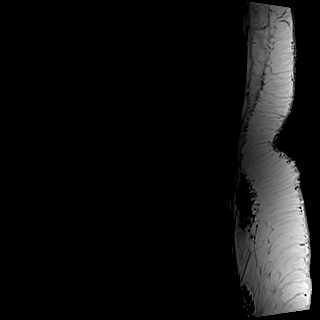

[Series 3: T2 · sagittal · 4.0mm · 0.42mm/px · 7 of 14 slices shown (1 of 2)]
[im 1/14]
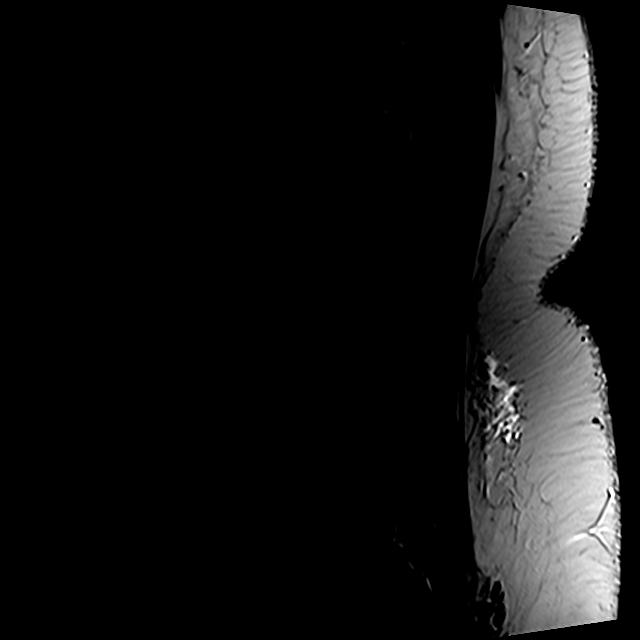
[im 3/14]
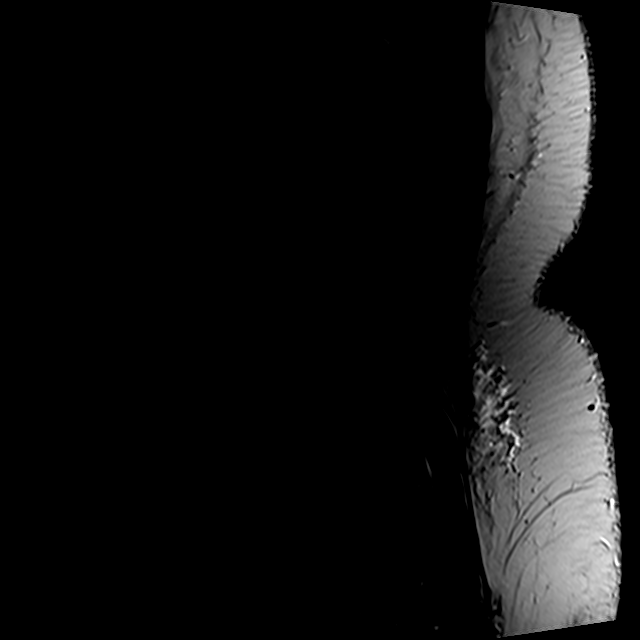
[im 5/14]
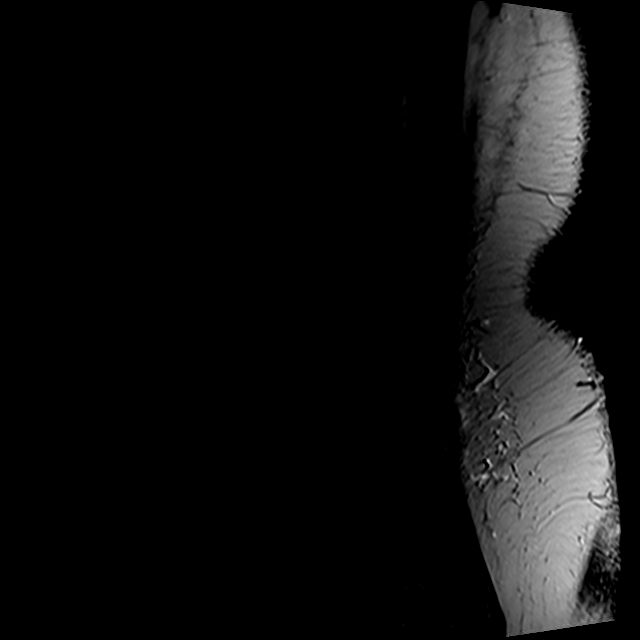
[im 7/14]
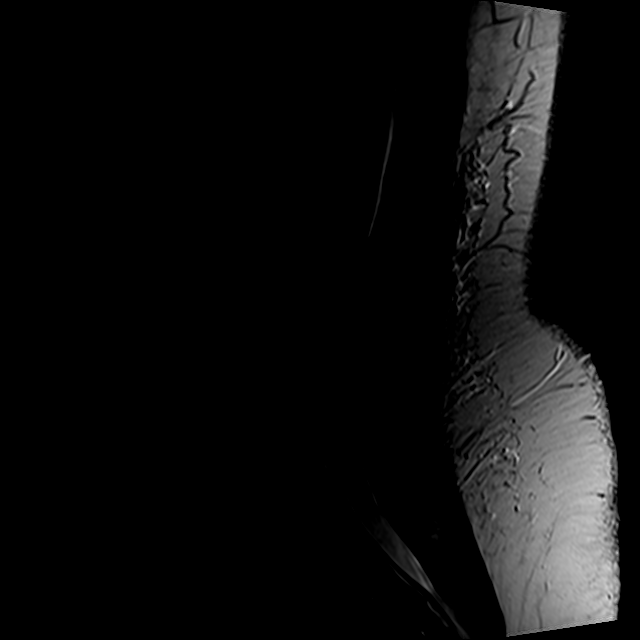
[im 9/14]
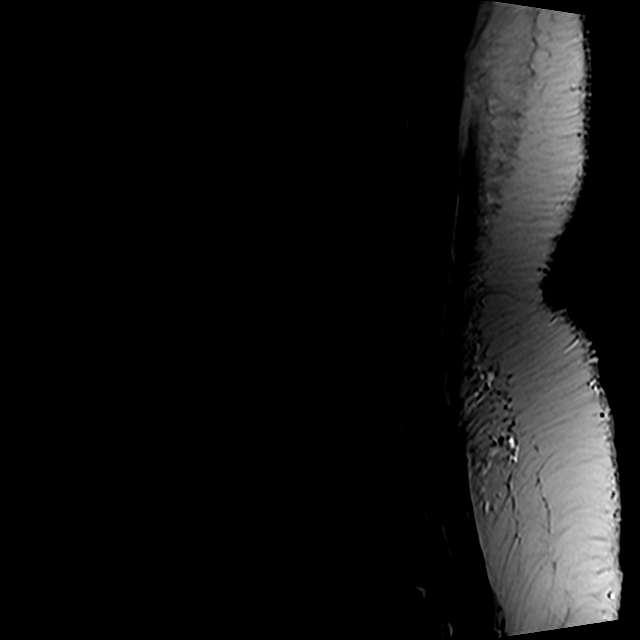
[im 11/14]
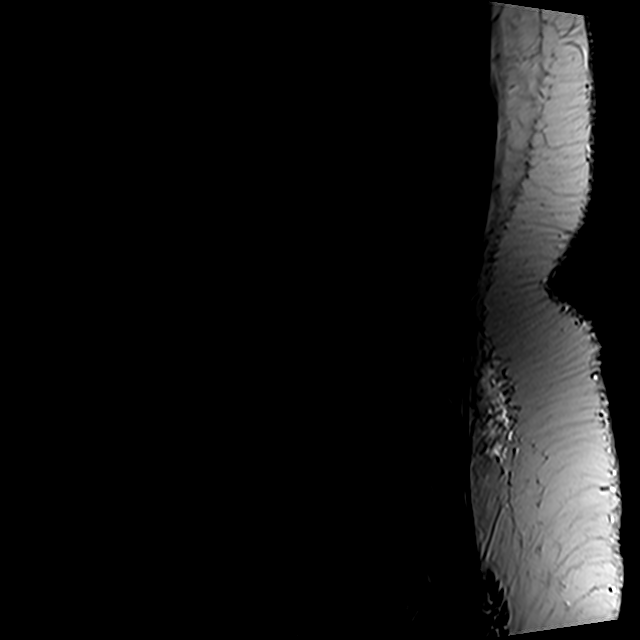
[im 14/14]
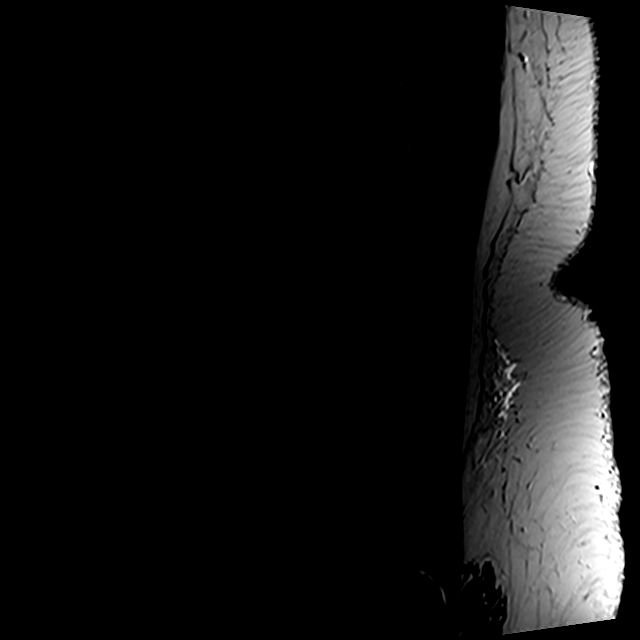

[Series 4: T2 · axial · 4.0mm · 0.37mm/px · z∈[-68,+78]mm · 4 of 32 slices shown (2 of 2)]
[im 1/32]
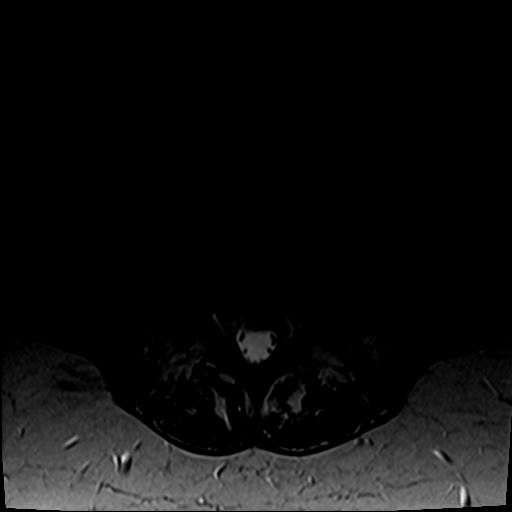
[im 5/32]
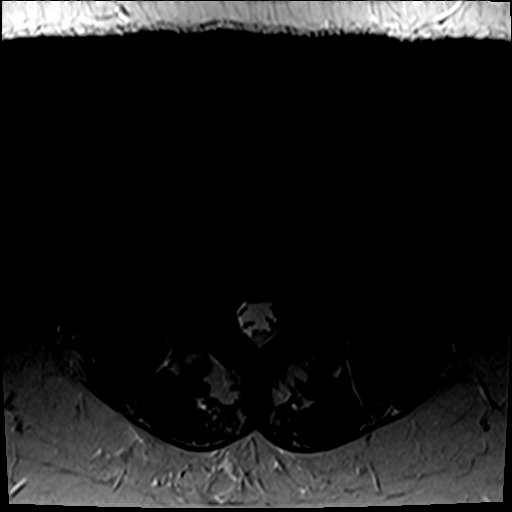
[im 17/32]
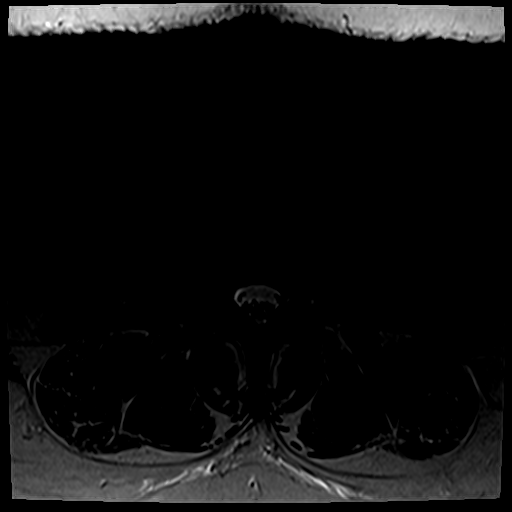
[im 27/32]
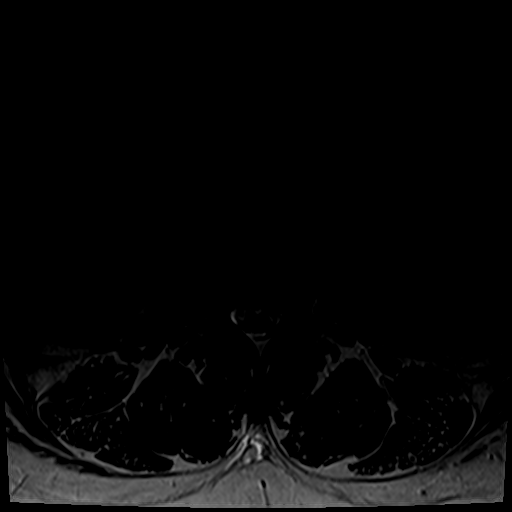

[Series 6: T1 · axial · 4.0mm · 0.37mm/px · z∈[-47,+78]mm · 3 of 32 slices shown (2 of 2)]
[im 5/32]
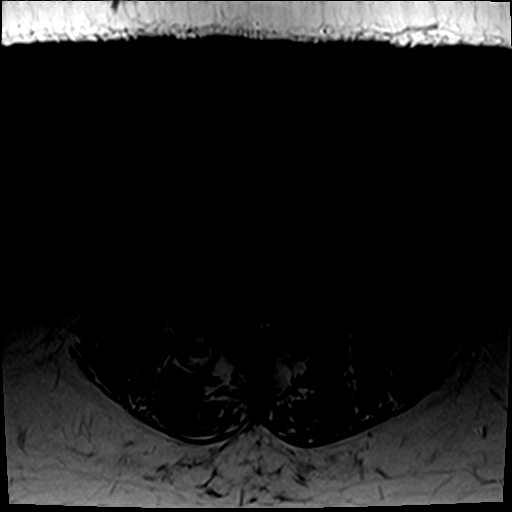
[im 17/32]
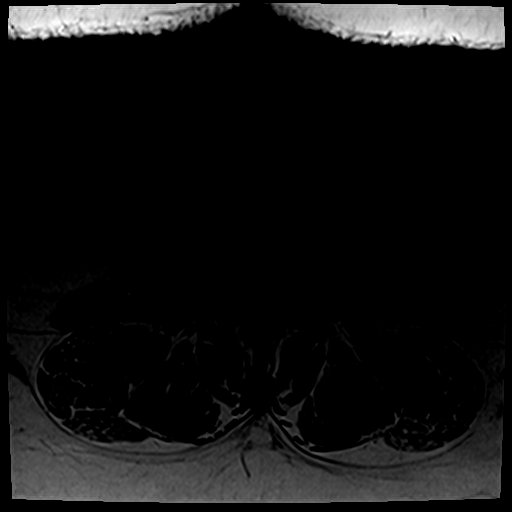
[im 27/32]
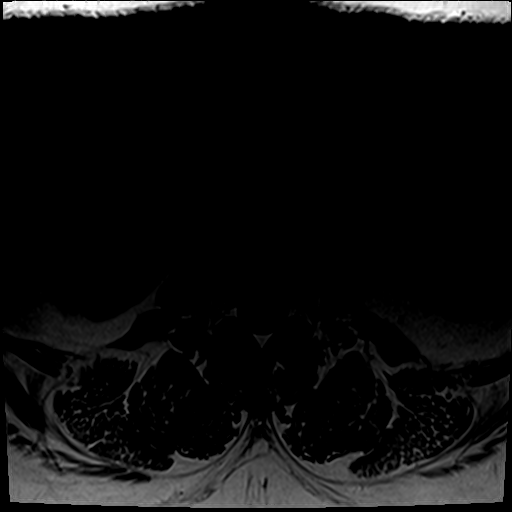

[17 of 48 positions shown; findings below may reference images not displayed]

FINDINGS: There is grade 1 anterolisthesis of L4 on L5, new from the prior MRI
and likely facet mediated. Trace retrolisthesis of L5 on S1 is
unchanged. Vertebral body heights are preserved without compression
fracture. Vertebral bone marrow signal is within normal limits.
Conus medullaris is normal in signal and terminates at L2-3,
somewhat low lying but unchanged. Paraspinal soft tissues are
unremarkable.

T12-L1: Only imaged sagittally. Mild disc bulge without evidence of
stenosis, similar to prior.

L1-2:  Mild facet arthrosis without stenosis, unchanged.

L2-3:  Mild facet arthrosis without stenosis, unchanged.

L3-4:  Mild facet arthrosis without stenosis, unchanged.

L4-5: Listhesis with uncovering of the disc and progressive,
moderate to severe facet arthrosis result in mild to moderate right
and mild left neural foraminal stenosis, new from prior. No spinal
canal stenosis.

L5-S1: Mild disc bulge with superimposed, small central disc
protrusion and moderate facet arthrosis result in mild bilateral
neural foraminal narrowing, unchanged. No spinal canal stenosis.
IMPRESSION: 1. Progressive facet degeneration at L4-5 with new grade 1
anterolisthesis and right greater than left neural foraminal
narrowing.
2. Unchanged appearance of mild disc degeneration and facet
arthrosis elsewhere in the lumbar spine.

## 2016-01-15 ENCOUNTER — Ambulatory Visit: Payer: Self-pay | Admitting: Family Medicine

## 2016-01-20 ENCOUNTER — Other Ambulatory Visit: Payer: Self-pay | Admitting: Family Medicine

## 2016-02-09 ENCOUNTER — Ambulatory Visit: Payer: Self-pay | Admitting: Family Medicine

## 2016-02-12 ENCOUNTER — Other Ambulatory Visit: Payer: Self-pay | Admitting: Family Medicine

## 2016-02-29 ENCOUNTER — Ambulatory Visit (INDEPENDENT_AMBULATORY_CARE_PROVIDER_SITE_OTHER): Payer: PRIVATE HEALTH INSURANCE | Admitting: Family Medicine

## 2016-02-29 ENCOUNTER — Encounter: Payer: Self-pay | Admitting: Family Medicine

## 2016-02-29 VITALS — BP 120/78 | HR 82 | Temp 98.6°F | Resp 18 | Ht 61.0 in | Wt 229.0 lb

## 2016-02-29 DIAGNOSIS — M5440 Lumbago with sciatica, unspecified side: Secondary | ICD-10-CM | POA: Diagnosis not present

## 2016-02-29 DIAGNOSIS — N3000 Acute cystitis without hematuria: Secondary | ICD-10-CM | POA: Diagnosis not present

## 2016-02-29 DIAGNOSIS — Z6841 Body Mass Index (BMI) 40.0 and over, adult: Secondary | ICD-10-CM | POA: Diagnosis not present

## 2016-02-29 DIAGNOSIS — N3090 Cystitis, unspecified without hematuria: Secondary | ICD-10-CM | POA: Diagnosis not present

## 2016-02-29 DIAGNOSIS — N6452 Nipple discharge: Secondary | ICD-10-CM

## 2016-02-29 DIAGNOSIS — H9202 Otalgia, left ear: Secondary | ICD-10-CM | POA: Diagnosis not present

## 2016-02-29 DIAGNOSIS — I10 Essential (primary) hypertension: Secondary | ICD-10-CM

## 2016-02-29 DIAGNOSIS — R490 Dysphonia: Secondary | ICD-10-CM | POA: Diagnosis not present

## 2016-02-29 LAB — POCT URINALYSIS DIPSTICK
BILIRUBIN UA: NEGATIVE
GLUCOSE UA: NEGATIVE
Ketones, UA: NEGATIVE
NITRITE UA: NEGATIVE
Protein, UA: NEGATIVE
Spec Grav, UA: 1.01
Urobilinogen, UA: 0.2
pH, UA: 5.5

## 2016-02-29 MED ORDER — CEPHALEXIN 500 MG PO CAPS: 500.0000 mg | ORAL_CAPSULE | Freq: Four times a day (QID) | ORAL | 0 refills | Status: DC

## 2016-02-29 MED ORDER — ESOMEPRAZOLE MAGNESIUM 40 MG PO CPDR: 40.0000 mg | DELAYED_RELEASE_CAPSULE | Freq: Every day | ORAL | 3 refills | Status: DC

## 2016-02-29 MED ORDER — CYCLOBENZAPRINE HCL 10 MG PO TABS: 10.0000 mg | ORAL_TABLET | Freq: Three times a day (TID) | ORAL | 0 refills | Status: DC | PRN

## 2016-02-29 MED ORDER — FLUCONAZOLE 150 MG PO TABS: ORAL_TABLET | ORAL | 0 refills | Status: DC

## 2016-02-29 NOTE — Patient Instructions (Addendum)
F/u in 4 month, call if  You need me before  Keflex sent for discharge and fluconazole  Urine being tested  Muscle relaxant sent for back spasm   You are referred to Dr Benjamine Mola for ear pain and hoarseness  and nexium 40 mg daily sent in  Please work on good  health habits so that your health will improve. 1. Commitment to daily physical activity for 30 to 60  minutes, if you are able to do this.  2. Commitment to wise food choices. Aim for half of your  food intake to be vegetable and fruit, one quarter starchy foods, and one quarter protein. Try to eat on a regular schedule  3 meals per day, snacking between meals should be limited to vegetables or fruits or small portions of nuts. 64 ounces of water per day is generally recommended, unless you have specific health conditions, like heart failure or kidney failure where you will need to limit fluid intake.  3. Commitment to sufficient and a  good quality of physical and mental rest daily, generally between 6 to 8 hours per day.  WITH PERSISTANCE AND PERSEVERANCE, THE IMPOSSIBLE , BECOMES THE NORM! Thank you  for choosing [facility]. We consider it a privelige to serve you.  Delivering excellent health care in a caring and  compassionate way is our goal.  Partnering with you,  so that together we can achieve this goal is our strategy.

## 2016-02-29 NOTE — Assessment & Plan Note (Addendum)
3 month h/o increased hoarseness, and uncontrolled reflux. Increase nexium dose and refer to ENT

## 2016-03-01 ENCOUNTER — Other Ambulatory Visit (HOSPITAL_COMMUNITY)
Admission: RE | Admit: 2016-03-01 | Discharge: 2016-03-01 | Disposition: A | Discharge status: Home / Self Care | Payer: PRIVATE HEALTH INSURANCE | Source: Ambulatory Visit | Attending: Family Medicine | Admitting: Family Medicine

## 2016-03-01 ENCOUNTER — Encounter: Payer: Self-pay | Admitting: Family Medicine

## 2016-03-01 DIAGNOSIS — H9202 Otalgia, left ear: Secondary | ICD-10-CM | POA: Insufficient documentation

## 2016-03-01 DIAGNOSIS — N3000 Acute cystitis without hematuria: Secondary | ICD-10-CM | POA: Diagnosis present

## 2016-03-01 DIAGNOSIS — N6452 Nipple discharge: Secondary | ICD-10-CM | POA: Insufficient documentation

## 2016-03-01 DIAGNOSIS — N3090 Cystitis, unspecified without hematuria: Secondary | ICD-10-CM | POA: Insufficient documentation

## 2016-03-01 NOTE — Assessment & Plan Note (Signed)
Increased spasm following straining at stool add flexeril short term

## 2016-03-01 NOTE — Assessment & Plan Note (Signed)
Partially impacted cerumen and foreign body resembling cotton in ear, refer eNT

## 2016-03-01 NOTE — Assessment & Plan Note (Signed)
Symptomatic with abn uA f/u on c/s

## 2016-03-01 NOTE — Assessment & Plan Note (Signed)
Controlled, no change in medication DASH diet and commitment to daily physical activity for a minimum of 30 minutes discussed and encouraged, as a part of hypertension management. The importance of attaining a healthy weight is also discussed.  BP/Weight 02/29/2016 09/14/2015 07/29/2015 12/01/2014 11/05/2014 09/22/2014 [id number]  Systolic BP 123456 123456 [id number] [id number]  Diastolic BP 78 82 82 - 80 80 80  Wt. (Lbs) 229.04 229 223 228 236 243.12 247  BMI 43.28 43.27 42.14 43.1 44.61 44.46 45.17  Some encounter information is confidential and restricted. Go to Review Flowsheets activity to see all data.

## 2016-03-01 NOTE — Progress Notes (Signed)
Stephanie Cannon     MRN: [id number]      DOB: January 03, 1971   HPI Stephanie Cannon is here for follow up and re-evaluation of chronic medical conditions, medication management and review of any available recent lab and radiology data.  Preventive health is updated, specifically  Cancer screening and Immunization.    The PT denies any adverse reactions to current medications since the last visit.  C/o lack of weight loss, has not been taking phentermine but admits to excess sweet intake and in sufficient exercise. C/o foul smelling white nipple discharge C/o painless hoarseness and loss of voice, also uncontrolled reflux which at times makes her nauseous C/o increased lowe back pain and spasmx 2 weeks after straining at stool  ROS Denies recent fever or chills. Denies sinus pressure, nasal congestion, ear pain or sore throat. Denies chest congestion, productive cough or wheezing. Denies chest pains, palpitatioabdominal pain, nausea, vomiting,diarrhea or constipation.   Malodorous urine and frequency x 1 month . Denies headaches, seizures, numbness, or tingling. Denies depression, anxiety or insomnia. Denies skin break down or rash.   PE  BP 120/78 (BP Location: Left Arm, Patient Position: Sitting, Cuff Size: Large)   Pulse 82   Temp 98.6 F (37 C) (Temporal)   Resp 18   Ht 5\' 1"  (1.549 m)   Wt 229 lb 0.6 oz (103.9 kg)   SpO2 100%   BMI 43.28 kg/m   Patient alert and oriented and in no cardiopulmonary distress.  HEENT: No facial asymmetry, EOMI,   oropharynx pink and moist.  Neck supple no JVD, no mass.  Chest: Clear to auscultation bilaterally.  CVS: S1, S2 no murmurs, no S3.Regular rate.  ABD: Soft non tender.   Ext: No edema  MS: Adequate though slightly reduced  ROM lumbar spine with paraspinal spasm,normal shoulders, hips and knees.  Skin: Intact, no ulcerations or rash noted.  Psych: Good eye contact, normal affect. Memory intact not anxious or depressed  appearing.  CNS: CN 2-12 intact, power,  normal throughout.no focal deficits noted.   Assessment & Plan Hoarseness of voice 3 month h/o increased hoarseness, and uncontrolled reflux. Increase nexium dose and refer to ENT  Essential hypertension Controlled, no change in medication DASH diet and commitment to daily physical activity for a minimum of 30 minutes discussed and encouraged, as a part of hypertension management. The importance of attaining a healthy weight is also discussed.  BP/Weight 02/29/2016 09/14/2015 07/29/2015 12/01/2014 11/05/2014 09/22/2014 [id number]  Systolic BP 123456 123456 [id number] - [id number] [id number]  Diastolic BP 78 82 82 - 80 80 80  Wt. (Lbs) 229.04 229 223 228 236 243.12 247  BMI 43.28 43.27 42.14 43.1 44.61 44.46 45.17  Some encounter information is confidential and restricted. Go to Review Flowsheets activity to see all data.       Morbid obesity with body mass index of 50.0-59.9 in adult [facility]) Unchanged Patient re-educated about  the importance of commitment to a  minimum of 150 minutes of exercise per week.  The importance of healthy food choices with portion control discussed. Encouraged to start a food diary, count calories and to consider  joining a support group. Sample diet sheets offered. Goals set by the patient for the next several months.   Weight /BMI 02/29/2016 09/14/2015 07/29/2015  WEIGHT 229 lb 0.6 oz 229 lb 223 lb  HEIGHT 5\' 1"  5\' 1"  5\' 1"   BMI 43.28 kg/m2 43.27 kg/m2 42.14 kg/m2  Some encounter information is confidential and restricted.  Go to Review Flowsheets activity to see all data.      Lumbar pain Increased spasm following straining at stool add flexeril short term  Cystitis Symptomatic with abn uA f/u on c/s  Discharge from both nipples Foul smelling white bilateral nipple discharge, short course of keflex prescribed, likely low grade  Infection from partially blocked ducts  Otalgia of left ear Partially impacted cerumen and foreign  body resembling cotton in ear, refer eNT

## 2016-03-01 NOTE — Assessment & Plan Note (Signed)
Unchanged Patient re-educated about  the importance of commitment to a  minimum of 150 minutes of exercise per week.  The importance of healthy food choices with portion control discussed. Encouraged to start a food diary, count calories and to consider  joining a support group. Sample diet sheets offered. Goals set by the patient for the next several months.   Weight /BMI 02/29/2016 09/14/2015 07/29/2015  WEIGHT 229 lb 0.6 oz 229 lb 223 lb  HEIGHT 5\' 1"  5\' 1"  5\' 1"   BMI 43.28 kg/m2 43.27 kg/m2 42.14 kg/m2  Some encounter information is confidential and restricted. Go to Review Flowsheets activity to see all data.

## 2016-03-01 NOTE — Assessment & Plan Note (Signed)
Foul smelling white bilateral nipple discharge, short course of keflex prescribed, likely low grade  Infection from partially blocked ducts

## 2016-03-03 ENCOUNTER — Encounter: Payer: Self-pay | Admitting: Family Medicine

## 2016-03-03 LAB — URINE CULTURE

## 2016-03-04 ENCOUNTER — Encounter: Payer: Self-pay | Admitting: Family Medicine

## 2016-03-07 ENCOUNTER — Ambulatory Visit (INDEPENDENT_AMBULATORY_CARE_PROVIDER_SITE_OTHER): Payer: PRIVATE HEALTH INSURANCE | Admitting: Otolaryngology

## 2016-03-07 DIAGNOSIS — H900 Conductive hearing loss, bilateral: Secondary | ICD-10-CM

## 2016-03-07 DIAGNOSIS — R490 Dysphonia: Secondary | ICD-10-CM | POA: Diagnosis not present

## 2016-03-07 DIAGNOSIS — H6123 Impacted cerumen, bilateral: Secondary | ICD-10-CM | POA: Diagnosis not present

## 2016-04-02 ENCOUNTER — Other Ambulatory Visit: Payer: Self-pay | Admitting: Family Medicine

## 2016-04-07 ENCOUNTER — Other Ambulatory Visit: Payer: Self-pay | Admitting: Family Medicine

## 2016-04-25 ENCOUNTER — Other Ambulatory Visit: Payer: Self-pay | Admitting: Family Medicine

## 2016-04-25 ENCOUNTER — Telehealth: Payer: Self-pay | Admitting: Family Medicine

## 2016-04-25 DIAGNOSIS — M25511 Pain in right shoulder: Secondary | ICD-10-CM

## 2016-04-25 NOTE — Telephone Encounter (Signed)
pls let pt know that I recommend for best / most effective help she see orthopedics as they have the expertise needed. Refer her for appt tomorrow if possible and if she agrees to ortho in [place] for eval and management of acute severe shoulder pain Pl let me know if still an issue, I am entering referral

## 2016-04-25 NOTE — Telephone Encounter (Signed)
Patient is having a lot of pain in the right shoulder region, hard to lift or move right arm, says its sore to the touch. cb#: [phone] She is requesting to see Dr.Simpson

## 2016-04-25 NOTE — Telephone Encounter (Signed)
Please advise if referral needed?

## 2016-04-26 ENCOUNTER — Encounter (INDEPENDENT_AMBULATORY_CARE_PROVIDER_SITE_OTHER): Payer: Self-pay | Admitting: Orthopaedic Surgery

## 2016-04-26 ENCOUNTER — Ambulatory Visit (INDEPENDENT_AMBULATORY_CARE_PROVIDER_SITE_OTHER): Payer: PRIVATE HEALTH INSURANCE | Admitting: Orthopaedic Surgery

## 2016-04-26 ENCOUNTER — Ambulatory Visit (INDEPENDENT_AMBULATORY_CARE_PROVIDER_SITE_OTHER): Payer: PRIVATE HEALTH INSURANCE

## 2016-04-26 DIAGNOSIS — M25511 Pain in right shoulder: Secondary | ICD-10-CM

## 2016-04-26 NOTE — Progress Notes (Signed)
Office Visit Note   Patient: Stephanie Cannon           Date of Birth: [birth year]           MRN: [id number] Visit Date: 04/26/2016              Requested by: Fayrene Helper, MD [address], [place], [place] [address] PCP: [name], MD   Assessment & Plan: Visit Diagnoses:  1. Acute pain of right shoulder     Plan: Overall impression is subacromial bursitis versus glenohumeral arthritis. Subacromial injection was performed today I'll like to see her back in the house she responds to this. If not better we'll consider intra-articular injection with Dr. Ernestina Patches.  Follow-Up Instructions: Return in about 2 weeks (around 05/10/2016).   Orders:  Orders Placed This Encounter  Procedures  . XR Shoulder Right   No orders of the defined types were placed in this encounter.     Procedures: No procedures performed   Clinical Data: No additional findings.   Subjective: Chief Complaint  Patient presents with  . Right Shoulder - Pain    Patient is a pleasant 46 year old female with right shoulder pain since Saturday that started insidiously. The pain is a sharp 8 out of 10 pain with difficulty doing things. She has trouble raising her arm up. She has self treated with heat and Flexeril with minimal relief. The pain does not radiate. She denies any injuries.    Review of Systems  Constitutional: Negative.   HENT: Negative.   Eyes: Negative.   Respiratory: Negative.   Cardiovascular: Negative.   Endocrine: Negative.   Musculoskeletal: Negative.   Neurological: Negative.   Hematological: Negative.   Psychiatric/Behavioral: Negative.   All other systems reviewed and are negative.    Objective: Vital Signs: There were no vitals taken for this visit.  Physical Exam  Constitutional: She is oriented to person, place, and time. She appears well-developed and well-nourished.  HENT:  Head: Normocephalic and atraumatic.  Eyes: EOM are normal.  Neck: Neck  supple.  Pulmonary/Chest: Effort normal.  Abdominal: Soft.  Neurological: She is alert and oriented to person, place, and time.  Skin: Skin is warm. Capillary refill takes less than 2 seconds.  Psychiatric: She has a normal mood and affect. Her behavior is normal. Judgment and thought content normal.  Nursing note and vitals reviewed.   Ortho Exam Right shoulder exam shows positive Hawkins and Neer impingement. Supraspinatus is intact but with pain. Infraspinatus elicits no pain. She has no pain with range of motion by the side of her arm. Specialty Comments:  No specialty comments available.  Imaging: Xr Shoulder Right  Result Date: 04/26/2016 Irregularity of greater tuberosity and acromial process. Mild osteoarthritis of the glenohumeral joint.    PMFS History: Patient Active Problem List   Diagnosis Date Noted  . Cystitis 03/01/2016  . Discharge from both nipples 03/01/2016  . Otalgia of left ear 03/01/2016  . Insomnia 09/22/2014  . Morbid obesity with body mass index of 50.0-59.9 in adult ([place]) 06/23/2014  . Spondylolisthesis of lumbar region 01/22/2014  . Urinary incontinence without sensory awareness 07/19/2013  . Accessory skin tags 04/03/2013  . Prediabetes 12/17/2012  . Metabolic syndrome X 56/25/6389  . Hoarseness of voice 07/11/2012  . Hot flashes due to surgical menopause 12/06/2011  . Anxiety 03/01/2011  . Hyperlipidemia LDL goal <100 07/08/2010  . HEMORRHOIDS 04/27/2009  . CARPAL TUNNEL SYNDROME 06/26/2007  . Essential hypertension 06/26/2007  .  GERD 06/26/2007  . Lumbar pain 06/26/2007   Past Medical History:  Diagnosis Date  . Arthritis   . Carpal tunnel syndrome    left, wears brace at night  . Chronic back pain   . GERD (gastroesophageal reflux disease)   . Hyperlipidemia   . Hypertension   . Obesity     Family History  Problem Relation Age of Onset  . Hypertension Mother   . Hypertension Father   . Hypertension Brother   . Cancer Maternal  Aunt   . Cancer Maternal Uncle     liver  . Cancer Paternal Uncle     bone cancer  . Diabetes Maternal Grandmother   . Colon cancer Neg Hx     Past Surgical History:  Procedure Laterality Date  . ABDOMINAL HYSTERECTOMY  2003   partial  . CARPAL TUNNEL RELEASE Right   . CESAREAN SECTION  1988  . CHOLECYSTECTOMY    . COLONOSCOPY N/A 09/21/2012   Procedure: COLONOSCOPY;  Surgeon: Danie Binder, MD;  Location: [facility];  Service: Endoscopy;  Laterality: N/A;  9:30  . LAPAROSCOPIC GASTRIC SLEEVE RESECTION WITH HIATAL HERNIA REPAIR N/A 06/23/2014   Procedure: LAPAROSCOPIC LYSIS OF ADHESIONS, GASTRIC SLEEVE RESECTION AND UPPER ENDO;  Surgeon: Alphonsa Overall, MD;  Location: [facility];  Service: General;  Laterality: N/A;  . LAPAROSCOPIC LYSIS OF ADHESIONS  06/23/2014   Procedure: LAPAROSCOPIC LYSIS OF ADHESIONS;  Surgeon: Alphonsa Overall, MD;  Location: [facility];  Service: General;;  . LUMBAR FUSION  01/22/2014   L4 L5       DR Arnoldo Morale  . 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 TONSILLECTOMY    . UPPER GI ENDOSCOPY  06/23/2014   Procedure: UPPER GI ENDOSCOPY;  Surgeon: Alphonsa Overall, MD;  Location: [facility];  Service: General;;   Social History   Occupational History  . [employer]    Social History Main Topics  . Smoking status: Former Smoker    Types: Cigarettes  . Smokeless tobacco: Never Used     Comment: quit smoking 2011  . Alcohol use Yes     Comment: occasionally  . Drug use: No  . Sexual activity: Not on file

## 2016-04-27 ENCOUNTER — Telehealth: Payer: Self-pay | Admitting: Family Medicine

## 2016-04-27 ENCOUNTER — Other Ambulatory Visit: Payer: Self-pay

## 2016-04-27 MED ORDER — MELOXICAM 15 MG PO TABS: ORAL_TABLET | ORAL | 1 refills | Status: DC

## 2016-04-27 NOTE — Telephone Encounter (Signed)
Patient called to request an rx for meloxicam   Pharmacy: [facility]   cB# [phone]

## 2016-04-27 NOTE — Telephone Encounter (Signed)
Med refilled.

## 2016-04-27 NOTE — Telephone Encounter (Signed)
pls refill x 2

## 2016-05-10 ENCOUNTER — Ambulatory Visit (INDEPENDENT_AMBULATORY_CARE_PROVIDER_SITE_OTHER): Payer: PRIVATE HEALTH INSURANCE | Admitting: Orthopaedic Surgery

## 2016-05-10 ENCOUNTER — Encounter (INDEPENDENT_AMBULATORY_CARE_PROVIDER_SITE_OTHER): Payer: Self-pay

## 2016-05-12 ENCOUNTER — Other Ambulatory Visit: Payer: Self-pay | Admitting: Family Medicine

## 2016-05-21 ENCOUNTER — Ambulatory Visit (INDEPENDENT_AMBULATORY_CARE_PROVIDER_SITE_OTHER): Payer: Self-pay | Admitting: Otolaryngology

## 2016-05-23 ENCOUNTER — Ambulatory Visit (INDEPENDENT_AMBULATORY_CARE_PROVIDER_SITE_OTHER): Payer: PRIVATE HEALTH INSURANCE | Admitting: Otolaryngology

## 2016-05-23 DIAGNOSIS — K219 Gastro-esophageal reflux disease without esophagitis: Secondary | ICD-10-CM

## 2016-05-23 DIAGNOSIS — R490 Dysphonia: Secondary | ICD-10-CM | POA: Diagnosis not present

## 2016-05-27 ENCOUNTER — Other Ambulatory Visit: Payer: Self-pay | Admitting: Family Medicine

## 2016-06-22 ENCOUNTER — Telehealth: Payer: Self-pay | Admitting: *Deleted

## 2016-06-22 DIAGNOSIS — Z6841 Body Mass Index (BMI) 40.0 and over, adult: Principal | ICD-10-CM

## 2016-06-22 NOTE — Telephone Encounter (Signed)
Referral entered  

## 2016-06-22 NOTE — Telephone Encounter (Signed)
Patient called requesting a referral to diabetic nutritious. Please advise

## 2016-06-28 ENCOUNTER — Other Ambulatory Visit: Payer: Self-pay | Admitting: Family Medicine

## 2016-07-05 DIAGNOSIS — J381 Polyp of vocal cord and larynx: Secondary | ICD-10-CM | POA: Insufficient documentation

## 2016-07-05 DIAGNOSIS — R490 Dysphonia: Secondary | ICD-10-CM | POA: Insufficient documentation

## 2016-07-08 ENCOUNTER — Other Ambulatory Visit: Payer: Self-pay | Admitting: Family Medicine

## 2016-07-11 ENCOUNTER — Other Ambulatory Visit: Payer: Self-pay | Admitting: Family Medicine

## 2016-07-11 MED ORDER — ALPRAZOLAM 1 MG PO TABS: ORAL_TABLET | ORAL | 0 refills | Status: DC

## 2016-07-11 NOTE — Telephone Encounter (Signed)
Seen 2 26 18

## 2016-07-11 NOTE — Progress Notes (Signed)
xanax

## 2016-07-14 ENCOUNTER — Encounter: Payer: Self-pay | Admitting: Family Medicine

## 2016-07-20 ENCOUNTER — Other Ambulatory Visit: Payer: Self-pay | Admitting: Obstetrics and Gynecology

## 2016-07-20 DIAGNOSIS — N63 Unspecified lump in unspecified breast: Secondary | ICD-10-CM

## 2016-07-26 ENCOUNTER — Other Ambulatory Visit: Payer: Self-pay

## 2016-07-28 ENCOUNTER — Other Ambulatory Visit: Payer: Self-pay

## 2016-07-28 ENCOUNTER — Ambulatory Visit
Admission: RE | Admit: 2016-07-28 | Discharge: 2016-07-28 | Disposition: A | Discharge status: Home / Self Care | Payer: PRIVATE HEALTH INSURANCE | Source: Ambulatory Visit | Attending: Obstetrics and Gynecology | Admitting: Obstetrics and Gynecology

## 2016-07-28 ENCOUNTER — Other Ambulatory Visit: Payer: Self-pay | Admitting: Obstetrics and Gynecology

## 2016-07-28 DIAGNOSIS — N63 Unspecified lump in unspecified breast: Secondary | ICD-10-CM

## 2016-07-28 DIAGNOSIS — N6320 Unspecified lump in the left breast, unspecified quadrant: Secondary | ICD-10-CM

## 2016-07-28 HISTORY — DX: Nipple discharge: N64.52

## 2016-07-30 ENCOUNTER — Other Ambulatory Visit: Payer: Self-pay | Admitting: Family Medicine

## 2016-08-01 ENCOUNTER — Ambulatory Visit: Payer: Self-pay | Admitting: Skilled Nursing Facility1

## 2016-08-02 ENCOUNTER — Ambulatory Visit
Admission: RE | Admit: 2016-08-02 | Discharge: 2016-08-02 | Disposition: A | Discharge status: Home / Self Care | Payer: BLUE CROSS/BLUE SHIELD | Source: Ambulatory Visit | Attending: Obstetrics and Gynecology | Admitting: Obstetrics and Gynecology

## 2016-08-02 ENCOUNTER — Other Ambulatory Visit: Payer: Self-pay | Admitting: Obstetrics and Gynecology

## 2016-08-02 ENCOUNTER — Ambulatory Visit
Admission: RE | Admit: 2016-08-02 | Discharge: 2016-08-02 | Disposition: A | Discharge status: Home / Self Care | Payer: PRIVATE HEALTH INSURANCE | Source: Ambulatory Visit | Attending: Obstetrics and Gynecology | Admitting: Obstetrics and Gynecology

## 2016-08-02 DIAGNOSIS — N6320 Unspecified lump in the left breast, unspecified quadrant: Secondary | ICD-10-CM

## 2016-08-04 ENCOUNTER — Other Ambulatory Visit: Payer: Self-pay | Admitting: Obstetrics and Gynecology

## 2016-08-04 DIAGNOSIS — Z1231 Encounter for screening mammogram for malignant neoplasm of breast: Secondary | ICD-10-CM

## 2016-08-09 ENCOUNTER — Other Ambulatory Visit: Payer: Self-pay | Admitting: Family Medicine

## 2016-08-10 ENCOUNTER — Other Ambulatory Visit: Payer: Self-pay | Admitting: Family Medicine

## 2016-08-16 ENCOUNTER — Encounter: Payer: Self-pay | Admitting: Family Medicine

## 2016-08-16 ENCOUNTER — Ambulatory Visit: Payer: Self-pay | Admitting: Family Medicine

## 2016-08-16 ENCOUNTER — Ambulatory Visit (INDEPENDENT_AMBULATORY_CARE_PROVIDER_SITE_OTHER): Payer: BLUE CROSS/BLUE SHIELD | Admitting: Family Medicine

## 2016-08-16 VITALS — BP 156/80 | HR 81 | Temp 97.9°F | Resp 16 | Ht 61.0 in | Wt 232.5 lb

## 2016-08-16 DIAGNOSIS — E785 Hyperlipidemia, unspecified: Secondary | ICD-10-CM

## 2016-08-16 DIAGNOSIS — L040 Acute lymphadenitis of face, head and neck: Secondary | ICD-10-CM

## 2016-08-16 DIAGNOSIS — J029 Acute pharyngitis, unspecified: Secondary | ICD-10-CM | POA: Diagnosis not present

## 2016-08-16 DIAGNOSIS — F419 Anxiety disorder, unspecified: Secondary | ICD-10-CM

## 2016-08-16 DIAGNOSIS — I10 Essential (primary) hypertension: Secondary | ICD-10-CM

## 2016-08-16 LAB — CBC WITH DIFFERENTIAL/PLATELET
BASOS ABS: 126 {cells}/uL (ref 0–200)
BASOS PCT: 1 %
EOS ABS: 126 {cells}/uL (ref 15–500)
Eosinophils Relative: 1 %
HCT: 47.9 % — ABNORMAL HIGH (ref 35.0–45.0)
Hemoglobin: 15.9 g/dL — ABNORMAL HIGH (ref 11.7–15.5)
LYMPHS PCT: 30 %
Lymphs Abs: 3780 cells/uL (ref 850–3900)
MCH: 29.6 pg (ref 27.0–33.0)
MCHC: 33.2 g/dL (ref 32.0–36.0)
MCV: 89.2 fL (ref 80.0–100.0)
MONO ABS: 1008 {cells}/uL — AB (ref 200–950)
MONOS PCT: 8 %
MPV: 9.3 fL (ref 7.5–12.5)
NEUTROS PCT: 60 %
Neutro Abs: 7560 cells/uL (ref 1500–7800)
PLATELETS: 320 10*3/uL (ref 140–400)
RBC: 5.37 MIL/uL — ABNORMAL HIGH (ref 3.80–5.10)
RDW: 13.8 % (ref 11.0–15.0)
WBC: 12.6 10*3/uL — ABNORMAL HIGH (ref 3.8–10.8)

## 2016-08-16 MED ORDER — MAGIC MOUTHWASH: ORAL | 0 refills | Status: DC

## 2016-08-16 MED ORDER — FLUCONAZOLE 150 MG PO TABS: ORAL_TABLET | ORAL | 0 refills | Status: DC

## 2016-08-16 MED ORDER — PENICILLIN V POTASSIUM 250 MG/5ML PO SOLR: ORAL | 0 refills | Status: DC

## 2016-08-16 NOTE — Progress Notes (Signed)
   Stephanie Cannon     MRN: [id number]      DOB: Nov 08, 1970   HPI Stephanie Cannon is here for follow up and re-evaluation of chronic medical conditions, medication management and review of any available recent lab and radiology data.  Preventive health is updated, specifically  Cancer screening and Immunization.   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 F/ u at [facility] next week  Also recently had breast biopsly for unilateral nipple d/c and has a left fibradenoma, seen by gyne ROS See HPI  C/o  chest congestion and  productive cough  Denies chest pains, palpitations and leg swelling Denies abdominal pain, nausea, vomiting,diarrhea or constipation.   Denies dysuria, frequency, hesitancy or incontinence. Denies joint pain, swelling and limitation in mobility. Denies headaches, seizures, numbness, or tingling. Denies depression, chronic anxiety and  Insomnia.Controlled by medication Denies skin break down or rash.   PE  BP (!) 156/80 (BP Location: Left Arm, Patient Position: Sitting, Cuff Size: Large)   Pulse 81   Temp 97.9 F (36.6 C) (Other (Comment))   Resp 16   Ht 5\' 1"  (1.549 m)   Wt 232 lb 8 oz (105.5 kg)   SpO2 98%   BMI 43.93 kg/m   Ill appearing and hoarse  Patient alert and oriented and in no cardiopulmonary distress.  HEENT: No facial asymmetry, EOMI,   oropharynx pink and moist.  Neck supple no JVD, Tender right cervical adenopathy  Chest: Clear to auscultation bilaterally.  CVS: S1, S2 no murmurs, no S3.Regular rate.  ABD: Soft non tender.   Ext: No edema  MS: Adequate ROM spine, shoulders, hips and knees.  Skin: Intact, no ulcerations or rash noted.  Psych: Good eye contact, normal affect. Memory intact not anxious or depressed appearing.  CNS: CN 2-12 intact, power,  normal throughout.no focal deficits noted.   Assessment & Plan  Sore  throat Magic mouthwash gargle swish and swallow x 10 days Vice rest and tylenol  Acute cervical adenitis Pen V liquid x 10 days  Essential hypertension Not at goal  DASH diet and commitment to daily physical activity for a minimum of 30 minutes discussed and encouraged, as a part of hypertension management. The importance of attaining a healthy weight is also discussed.  BP/Weight 08/17/2016 08/16/2016 02/29/2016 09/14/2015 07/29/2015 12/01/2014 49/02/98  Systolic BP - 712 197 588 325 - 498  Diastolic BP - 80 78 82 82 - 80  Wt. (Lbs) 233.6 232.5 229.04 229 223 228 236  BMI 44.14 43.93 43.28 43.27 42.14 43.1 44.61  Some encounter information is confidential and restricted. Go to Review Flowsheets activity to see all data.       Anxiety Continue bedtime xanax for sleep and anxiety

## 2016-08-16 NOTE — Patient Instructions (Signed)
F/u in 5 months, call if t you need me sooner  Cancel appt this week,  CBC and diff and chem 7 today  3 month of meds will be sent later  Treatment is for a sore throat with cerviacl adenitis  Thank you  for choosing [facility]. We consider it a privelige to serve you.  Delivering excellent health care in a caring and  compassionate way is our goal.  Partnering with you,  so that together we can achieve this goal is our strategy.

## 2016-08-17 ENCOUNTER — Encounter: Payer: Self-pay | Admitting: Family Medicine

## 2016-08-17 ENCOUNTER — Encounter: Payer: Self-pay | Admitting: Skilled Nursing Facility1

## 2016-08-17 ENCOUNTER — Encounter: Payer: PRIVATE HEALTH INSURANCE | Attending: Family Medicine | Admitting: Skilled Nursing Facility1

## 2016-08-17 DIAGNOSIS — Z6841 Body Mass Index (BMI) 40.0 and over, adult: Secondary | ICD-10-CM | POA: Diagnosis present

## 2016-08-17 DIAGNOSIS — E669 Obesity, unspecified: Secondary | ICD-10-CM

## 2016-08-17 LAB — COMPLETE METABOLIC PANEL WITH GFR
ALT: 20 U/L (ref 6–29)
AST: 17 U/L (ref 10–35)
Albumin: 4.1 g/dL (ref 3.6–5.1)
Alkaline Phosphatase: 114 U/L (ref 33–115)
BILIRUBIN TOTAL: 0.3 mg/dL (ref 0.2–1.2)
BUN: 18 mg/dL (ref 7–25)
CHLORIDE: 99 mmol/L (ref 98–110)
CO2: 27 mmol/L (ref 20–32)
Calcium: 10 mg/dL (ref 8.6–10.2)
Creat: 0.91 mg/dL (ref 0.50–1.10)
GFR, Est African American: 87 mL/min (ref >=60)
GFR, Est Non African American: 75 mL/min (ref >=60)
GLUCOSE: 82 mg/dL (ref 65–99)
Potassium: 4.8 mmol/L (ref 3.5–5.3)
SODIUM: 139 mmol/L (ref 135–146)
Total Protein: 7.9 g/dL (ref 6.1–8.1)

## 2016-08-17 NOTE — Progress Notes (Signed)
  Primary concerns today: Post-operative Bariatric Surgery Nutrition Management.  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 Pt states she loves her premier protein shakes. Pt states she has arthritis in her back which limits her mobility. Pt is drinking out of plastic water bottles. Pt talked about herself negatively throughout the appointment due to her not being happy with her weight loss.   Surgery date: 06/23/14 Surgery type: Gastric sleeve Start weight at [facility]: 303 lbs on 02/25/14 Weight today: 258.5 lbs Weight change: 24.9 Total weight lost: 69.4 lbs  TANITA  BODY COMP RESULTS  08/17/2016   BMI (kg/m^2) 44.1   Fat Mass (lbs) 116.6   Fat Free Mass (lbs) 117   Total Body Water (lbs) 85.4    24-hr recall: B (9AM): premier protein shake---[place] oatmeal and bacon Snk (AM): grapes or peanutbutter crackers or candy bar L (PM): premier shake---skipped Snk (PM):  Ham or [place] sandwich or that is her dinner  D (PM): pork chop and bread  Snk (PM):   Fluid intake: water with flavorings: 48 oz, 8 ounces every day gingerale (32g sugar) Estimated total protein intake: 80  Medications: See List Supplementation: Flinstones, b12 drops   Using straws: no Drinking while eating: no Having you been chewing well:no Chewing/swallowing difficulties: no Changes in vision: no Changes to mood/headaches: no Hair loss/Cahnges to skin/Changes to nails: no Any difficulty focusing or concentrating: no Sweating: no Dizziness/Lightheaded: no Palpitations: no  Carbonated beverages: no N/V/D/C/GAS: no Abdominal Pain: no Dumping syndrome: no  Recent physical activity:  ADL's  Progress Towards Goal(s):  In progress.   Nutritional Diagnosis:  [place]-3.3 Overweight/obesity related to past poor dietary habits and physical inactivity as evidenced by patient w/ recent sleeve surgery following dietary guidelines for continued weight loss.    Intervention:  Nutrition counseling.  Dietitian educated the pt on a healthy lifestyle considering bariatric surgery. Goals: -Mark a hash every time your mean to yourself for one whole day Then try either turning those remarks into a positive or doing some detective work and trying to determine why you said what you just said  -Take your multi and calcium -Arm chair exercises 3 days a week at least 2 times in those days -Add some fruit with your protein shake  Teaching Method Utilized:  Visual Auditory Hands on  Barriers to learning/adherence to lifestyle change: preconceived notions of diets  Demonstrated degree of understanding via:  Teach Back   Monitoring/Evaluation:  Dietary intake, exercise, and body weight.

## 2016-08-17 NOTE — Patient Instructions (Addendum)
-  Mark a Network engineer every time your mean to yourself for one whole day Then try either turning those remarks into a positive or doing some detective work and trying to determine why you said what you just said   -Take your multi and calcium  -Arm chair exercises 3 days a week at least 2 times in those days  -Add some fruit with your protein shake

## 2016-08-18 ENCOUNTER — Ambulatory Visit: Payer: Self-pay | Admitting: Family Medicine

## 2016-08-18 ENCOUNTER — Telehealth: Payer: Self-pay | Admitting: Family Medicine

## 2016-08-18 DIAGNOSIS — D72829 Elevated white blood cell count, unspecified: Secondary | ICD-10-CM

## 2016-08-18 NOTE — Telephone Encounter (Signed)
Pls mail lab order to pt for CBC to be drawnOctober 16 , dx is elevated WBC/ leukocytosis Thanks

## 2016-08-18 NOTE — Telephone Encounter (Signed)
Done

## 2016-08-20 ENCOUNTER — Encounter: Payer: Self-pay | Admitting: Family Medicine

## 2016-08-20 DIAGNOSIS — J029 Acute pharyngitis, unspecified: Secondary | ICD-10-CM | POA: Insufficient documentation

## 2016-08-20 DIAGNOSIS — L040 Acute lymphadenitis of face, head and neck: Secondary | ICD-10-CM | POA: Insufficient documentation

## 2016-08-20 NOTE — Assessment & Plan Note (Signed)
Pen V liquid x 10 days

## 2016-08-20 NOTE — Assessment & Plan Note (Signed)
Not at goal  DASH diet and commitment to daily physical activity for a minimum of 30 minutes discussed and encouraged, as a part of hypertension management. The importance of attaining a healthy weight is also discussed.  BP/Weight 08/17/2016 08/16/2016 02/29/2016 09/14/2015 07/29/2015 12/01/2014 30/08/6576  Systolic BP - 469 629 528 413 - 244  Diastolic BP - 80 78 82 82 - 80  Wt. (Lbs) 233.6 232.5 229.04 229 223 228 236  BMI 44.14 43.93 43.28 43.27 42.14 43.1 44.61  Some encounter information is confidential and restricted. Go to Review Flowsheets activity to see all data.

## 2016-08-20 NOTE — Assessment & Plan Note (Signed)
Continue bedtime xanax for sleep and anxiety 

## 2016-08-20 NOTE — Assessment & Plan Note (Signed)
Magic mouthwash gargle swish and swallow x 10 days Vice rest and tylenol

## 2016-08-25 ENCOUNTER — Other Ambulatory Visit: Payer: Self-pay | Admitting: Family Medicine

## 2016-09-09 ENCOUNTER — Telehealth: Payer: Self-pay

## 2016-09-09 NOTE — Telephone Encounter (Signed)
Pt and husband should have received 26 day meds and she only received 30.  Can you re-do these meds,. She did pick up the 30 for her and her husband.  Please call her back.

## 2016-09-09 NOTE — Telephone Encounter (Signed)
Called patient regarding message below. No answer, left generic message for patient to return call.   

## 2016-09-14 ENCOUNTER — Other Ambulatory Visit: Payer: Self-pay | Admitting: Family Medicine

## 2016-09-14 ENCOUNTER — Telehealth: Payer: Self-pay | Admitting: Family Medicine

## 2016-09-14 MED ORDER — CYCLOBENZAPRINE HCL 10 MG PO TABS: ORAL_TABLET | ORAL | 0 refills | Status: DC

## 2016-09-14 MED ORDER — TRIAMTERENE-HCTZ 37.5-25 MG PO TABS: 1.0000 | ORAL_TABLET | Freq: Every day | ORAL | 0 refills | Status: DC

## 2016-09-14 MED ORDER — ALPRAZOLAM 1 MG PO TABS: ORAL_TABLET | ORAL | 0 refills | Status: DC

## 2016-09-14 NOTE — Telephone Encounter (Signed)
Patient is requesting a 90 day supply be sent to [facility] for the following medications: -xanex , flexeril, and her blood pressure medication  Cb#: [phone]

## 2016-09-14 NOTE — Telephone Encounter (Signed)
pls send as  Requested

## 2016-09-20 ENCOUNTER — Ambulatory Visit
Admission: RE | Admit: 2016-09-20 | Discharge: 2016-09-20 | Disposition: A | Discharge status: Home / Self Care | Payer: BLUE CROSS/BLUE SHIELD | Source: Ambulatory Visit | Attending: Obstetrics and Gynecology | Admitting: Obstetrics and Gynecology

## 2016-09-20 ENCOUNTER — Ambulatory Visit: Payer: Self-pay

## 2016-09-20 DIAGNOSIS — Z1231 Encounter for screening mammogram for malignant neoplasm of breast: Secondary | ICD-10-CM

## 2016-10-04 ENCOUNTER — Other Ambulatory Visit: Payer: Self-pay | Admitting: Family Medicine

## 2016-10-04 ENCOUNTER — Encounter: Payer: Self-pay | Admitting: Family Medicine

## 2016-10-04 ENCOUNTER — Telehealth: Payer: Self-pay | Admitting: Family Medicine

## 2016-10-04 MED ORDER — CIPROFLOXACIN HCL 500 MG PO TABS: 500.0000 mg | ORAL_TABLET | Freq: Two times a day (BID) | ORAL | 0 refills | Status: DC

## 2016-10-04 NOTE — Telephone Encounter (Signed)
3 day course of cipro sent to [facility]

## 2016-10-04 NOTE — Telephone Encounter (Signed)
Patient wants to know if Dr.Simpson can call something in for UTI, she says she can not miss work and she works 8-5 in [place]  Cb#: [phone]

## 2016-10-05 NOTE — Telephone Encounter (Signed)
Med sent and pt is aware

## 2016-11-07 ENCOUNTER — Other Ambulatory Visit: Payer: Self-pay | Admitting: Family Medicine

## 2016-11-10 ENCOUNTER — Encounter: Payer: Self-pay | Admitting: Family Medicine

## 2016-11-17 ENCOUNTER — Other Ambulatory Visit: Payer: Self-pay

## 2016-11-17 ENCOUNTER — Telehealth: Payer: Self-pay | Admitting: *Deleted

## 2016-11-17 MED ORDER — ALPRAZOLAM 1 MG PO TABS: ORAL_TABLET | ORAL | 0 refills | Status: DC

## 2016-11-17 NOTE — Telephone Encounter (Signed)
Patient called requesting a 90 day supply on her xanax sent to [facility]

## 2016-11-17 NOTE — Telephone Encounter (Signed)
Refill sent.

## 2016-12-18 ENCOUNTER — Other Ambulatory Visit: Payer: Self-pay | Admitting: Family Medicine

## 2016-12-19 NOTE — Telephone Encounter (Signed)
Seen 8 14 18

## 2017-01-06 ENCOUNTER — Encounter: Payer: Self-pay | Admitting: Family Medicine

## 2017-01-10 ENCOUNTER — Encounter: Payer: Self-pay | Admitting: Family Medicine

## 2017-01-11 ENCOUNTER — Encounter: Payer: Self-pay | Admitting: Family Medicine

## 2017-01-16 ENCOUNTER — Other Ambulatory Visit: Payer: Self-pay | Admitting: Family Medicine

## 2017-01-16 ENCOUNTER — Ambulatory Visit: Payer: Self-pay | Admitting: Family Medicine

## 2017-01-23 ENCOUNTER — Encounter (HOSPITAL_COMMUNITY): Payer: Self-pay

## 2017-01-23 ENCOUNTER — Encounter: Payer: Self-pay | Admitting: Family Medicine

## 2017-01-23 ENCOUNTER — Ambulatory Visit (INDEPENDENT_AMBULATORY_CARE_PROVIDER_SITE_OTHER): Payer: BLUE CROSS/BLUE SHIELD | Admitting: Family Medicine

## 2017-01-23 VITALS — BP 140/90 | HR 73 | Resp 16 | Ht 61.0 in | Wt 245.0 lb

## 2017-01-23 DIAGNOSIS — F419 Anxiety disorder, unspecified: Secondary | ICD-10-CM

## 2017-01-23 DIAGNOSIS — I10 Essential (primary) hypertension: Secondary | ICD-10-CM

## 2017-01-23 DIAGNOSIS — F3289 Other specified depressive episodes: Secondary | ICD-10-CM

## 2017-01-23 DIAGNOSIS — M545 Low back pain, unspecified: Secondary | ICD-10-CM

## 2017-01-23 DIAGNOSIS — R8290 Unspecified abnormal findings in urine: Secondary | ICD-10-CM | POA: Diagnosis not present

## 2017-01-23 DIAGNOSIS — F5104 Psychophysiologic insomnia: Secondary | ICD-10-CM | POA: Diagnosis not present

## 2017-01-23 LAB — POCT URINALYSIS DIPSTICK
Appearance: NORMAL
BILIRUBIN UA: NEGATIVE
GLUCOSE UA: NEGATIVE
KETONES UA: NEGATIVE
Leukocytes, UA: NEGATIVE
Nitrite, UA: NEGATIVE
ODOR: NORMAL
PH UA: 6 (ref 5.0–8.0)
Protein, UA: NEGATIVE
Spec Grav, UA: 1.01 (ref 1.010–1.025)
UROBILINOGEN UA: 0.2 U/dL

## 2017-01-23 MED ORDER — FLUOXETINE HCL 20 MG PO TABS
20.0000 mg | ORAL_TABLET | Freq: Every day | ORAL | 3 refills | Status: DC
Start: 2017-01-23 — End: 2017-04-28

## 2017-01-23 MED ORDER — TRIAMTERENE-HCTZ 37.5-25 MG PO TABS: ORAL_TABLET | ORAL | 3 refills | Status: DC

## 2017-01-23 NOTE — Patient Instructions (Addendum)
Nurse BP check next week Thursday  Increase maxzide to 1.5 tablets daily    F/U in 2 months,  New for depression and anxiety is fluoxetine 20 mg daily,   Urine will be sent for further testig

## 2017-01-26 ENCOUNTER — Encounter: Payer: Self-pay | Admitting: Family Medicine

## 2017-01-26 NOTE — Assessment & Plan Note (Signed)
Deteriorated. Patient re-educated about  the importance of commitment to a  minimum of 150 minutes of exercise per week.  The importance of healthy food choices with portion control discussed. Encouraged to start a food diary, count calories and to consider  joining a support group. Sample diet sheets offered. Goals set by the patient for the next several months.   Weight /BMI 01/23/2017 08/17/2016 08/16/2016  WEIGHT 245 lb 233 lb 9.6 oz 232 lb 8 oz  HEIGHT 5\' 1"  5\' 1"  5\' 1"   BMI 46.29 kg/m2 44.14 kg/m2 43.93 kg/m2  Some encounter information is confidential and restricted. Go to Review Flowsheets activity to see all data.

## 2017-01-26 NOTE — Assessment & Plan Note (Signed)
Sleep hygiene reviewed and written information offered also. Prescription sent for  medication needed.  

## 2017-01-26 NOTE — Assessment & Plan Note (Signed)
Deteriorated with weight re gain, pt to remain on current medication and work more consistently on weight loss

## 2017-01-26 NOTE — Progress Notes (Signed)
Stephanie Cannon     MRN: [id number]      DOB: [birth year]   HPI Stephanie Cannon is here for follow up and re-evaluation of chronic medical conditions, medication management and review of any available recent lab and radiology data.  Preventive health is updated, specifically  Cancer screening and Immunization.   . The PT denies any adverse reactions to current medications since the last visit.  C/o increased back pain and weight re gain, frustrated and stressed. 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 Denies sinus pressure, nasal congestion, ear pain or sore throat. Denies chest congestion, productive cough or wheezing. Denies chest pains, palpitations and leg swelling Denies abdominal pain, nausea, vomiting,diarrhea or constipation.   C/o strong urine odor Denies headaches, seizures, numbness, or tingling. Denies skin break down or rash.   PE  BP 140/90   Pulse 73   Resp 16   Ht 5\' 1"  (1.549 m)   Wt 245 lb (111.1 kg)   SpO2 98%   BMI 46.29 kg/m   Patient alert and oriented and in no cardiopulmonary distress.  HEENT: No facial asymmetry, EOMI,   oropharynx pink and moist.  Neck supple no JVD, no mass.  Chest: Clear to auscultation bilaterally.  CVS: S1, S2 no murmurs, no S3.Regular rate.  ABD: Soft non tender.   Ext: No edema  MS: Adequate though reduced  ROM lumbar  Spine,normal in shoulders, hips and knees.  Skin: Intact, no ulcerations or rash noted.  Psych: Good eye contact, normal affect. Memory intact  anxious and mildly  depressed appearing.  CNS: CN 2-12 intact, power,  normal throughout.no focal deficits noted.   Assessment & Plan  Essential hypertension Uncontrolled , increase maxzide to 1.5 tabs daily Nurse bP check in 10 days DASH diet and  commitment to daily physical activity for a minimum of 30 minutes discussed and encouraged, as a part of hypertension management. The importance of attaining a healthy weight is also discussed.  BP/Weight 01/23/2017 08/17/2016 08/16/2016 02/29/2016 09/14/2015 07/29/2015 97/35/3299  Systolic BP 242 - 683 419 622 297 -  Diastolic BP 90 - 80 78 82 82 -  Wt. (Lbs) 245 233.6 232.5 229.04 229 223 228  BMI 46.29 44.14 43.93 43.28 43.27 42.14 43.1  Some encounter information is confidential and restricted. Go to Review Flowsheets activity to see all data.       Depression Start fluoxetine 20 mg daily  Insomnia Sleep hygiene reviewed and written information offered also. Prescription sent for  medication needed.   Morbid obesity with body mass index of 50.0-59.9 in adult [facility]) Deteriorated. Patient re-educated about  the importance of commitment to a  minimum of 150 minutes of exercise per week.  The importance of healthy food choices with portion control discussed. Encouraged to start a food diary, count calories and to consider  joining a support group. Sample diet sheets offered. Goals set by the patient for the next several months.   Weight /BMI 01/23/2017 08/17/2016 08/16/2016  WEIGHT 245 lb 233 lb 9.6 oz 232 lb 8 oz  HEIGHT 5\' 1"  5\' 1"  5\' 1"   BMI 46.29 kg/m2 44.14 kg/m2 43.93 kg/m2  Some encounter information is confidential and restricted. Go to Review Flowsheets activity to see all data.  Lumbar pain Deteriorated with weight re gain, pt to remain on current medication and work more consistently on weight loss

## 2017-01-26 NOTE — Assessment & Plan Note (Signed)
Start fluoxetine 20 mg daily.

## 2017-01-26 NOTE — Assessment & Plan Note (Signed)
Uncontrolled , increase maxzide to 1.5 tabs daily Nurse bP check in 10 days DASH diet and commitment to daily physical activity for a minimum of 30 minutes discussed and encouraged, as a part of hypertension management. The importance of attaining a healthy weight is also discussed.  BP/Weight 01/23/2017 08/17/2016 08/16/2016 02/29/2016 09/14/2015 07/29/2015 24/46/9507  Systolic BP 225 - 750 518 335 825 -  Diastolic BP 90 - 80 78 82 82 -  Wt. (Lbs) 245 233.6 232.5 229.04 229 223 228  BMI 46.29 44.14 43.93 43.28 43.27 42.14 43.1  Some encounter information is confidential and restricted. Go to Review Flowsheets activity to see all data.

## 2017-02-07 ENCOUNTER — Other Ambulatory Visit: Payer: Self-pay | Admitting: Family Medicine

## 2017-02-08 ENCOUNTER — Telehealth: Payer: Self-pay | Admitting: Family Medicine

## 2017-02-08 NOTE — Telephone Encounter (Signed)
I called pt and advised her of need to return for bP re eval, said she was "unaware" Her form is cOPMPLETE except for the repeat blood pressure which will need to be documented as a nuirse visit in her chart and also on the form, I will date and initial once done. She stated coming in on Monday.   Envelope is in your area, please co[py for filing the form so we have this for future use, thanks  Will also request Management to bill

## 2017-02-09 ENCOUNTER — Telehealth: Payer: Self-pay | Admitting: Family Medicine

## 2017-02-09 ENCOUNTER — Ambulatory Visit: Payer: BLUE CROSS/BLUE SHIELD

## 2017-02-09 VITALS — BP 136/84

## 2017-02-09 DIAGNOSIS — K649 Unspecified hemorrhoids: Secondary | ICD-10-CM

## 2017-02-09 DIAGNOSIS — I10 Essential (primary) hypertension: Secondary | ICD-10-CM

## 2017-02-09 DIAGNOSIS — M545 Low back pain: Secondary | ICD-10-CM

## 2017-02-09 DIAGNOSIS — K219 Gastro-esophageal reflux disease without esophagitis: Secondary | ICD-10-CM

## 2017-02-10 NOTE — Telephone Encounter (Signed)
BP was checked and better and form was faxed back in

## 2017-02-10 NOTE — Progress Notes (Signed)
Bp improved from last visit. Form faxed back in to [facility]

## 2017-02-15 ENCOUNTER — Telehealth: Payer: Self-pay | Admitting: Family Medicine

## 2017-02-15 MED ORDER — TRIAMTERENE-HCTZ 75-50 MG PO TABS: 1.0000 | ORAL_TABLET | Freq: Every day | ORAL | 1 refills | Status: DC

## 2017-02-15 NOTE — Telephone Encounter (Signed)
Please call patient and let her know that since her recent blood pressure was still high despite the increased dose of triamterene, she needs to further increase the dose. Verify that she is currently taking 37.5/one and a half tabs daily If so needs to increase to TWO tabs daily. I am printing the higher dose equivalent to  TWO daily which is 75 mg tablet , pls fax after you speak with her , she will take ONE of those once daily when she finishes the current tmaxzide 25 mg tablet that she is to start taking TWO daily of  If you are unclear, may hold off until I am back

## 2017-02-16 NOTE — Telephone Encounter (Signed)
Patient aware and med sent  

## 2017-02-23 ENCOUNTER — Other Ambulatory Visit: Payer: Self-pay | Admitting: Family Medicine

## 2017-02-27 ENCOUNTER — Telehealth: Payer: Self-pay

## 2017-02-27 NOTE — Telephone Encounter (Signed)
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

## 2017-02-27 NOTE — Telephone Encounter (Signed)
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

## 2017-03-01 NOTE — Telephone Encounter (Signed)
Requested form given to MD

## 2017-03-10 ENCOUNTER — Other Ambulatory Visit: Payer: Self-pay | Admitting: Family Medicine

## 2017-03-22 ENCOUNTER — Telehealth: Payer: Self-pay | Admitting: Family Medicine

## 2017-03-22 NOTE — Telephone Encounter (Signed)
Do we do these shots --- Saxenda---- this is for weight loss?

## 2017-03-23 NOTE — Telephone Encounter (Signed)
No we do not

## 2017-04-01 ENCOUNTER — Other Ambulatory Visit: Payer: Self-pay | Admitting: Family Medicine

## 2017-04-13 ENCOUNTER — Other Ambulatory Visit: Payer: Self-pay | Admitting: Family Medicine

## 2017-04-19 ENCOUNTER — Telehealth: Payer: Self-pay | Admitting: Family Medicine

## 2017-04-19 NOTE — Telephone Encounter (Signed)
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 She is to fax the new letter received in the mail today revoking all driving priviliges. I have promised to fax in an urgent manner to [facility] [place] [facility] a letter of petitio that she have her regular private divers license privilege restored

## 2017-04-26 ENCOUNTER — Telehealth: Payer: Self-pay

## 2017-04-26 ENCOUNTER — Other Ambulatory Visit: Payer: Self-pay | Admitting: Family Medicine

## 2017-04-26 MED ORDER — ESOMEPRAZOLE MAGNESIUM 40 MG PO CPDR: 40.0000 mg | DELAYED_RELEASE_CAPSULE | Freq: Every day | ORAL | 3 refills | Status: DC

## 2017-04-26 NOTE — Telephone Encounter (Signed)
Patient is requesting nexium since she thinks her new insurance will pay for it now and she is needing it bad she states for reflux. Please advise. Wants it sent to [place]

## 2017-04-26 NOTE — Telephone Encounter (Signed)
Medication sent to [facility]

## 2017-04-27 ENCOUNTER — Other Ambulatory Visit: Payer: Self-pay | Admitting: Family Medicine

## 2017-04-27 ENCOUNTER — Telehealth: Payer: Self-pay

## 2017-04-27 NOTE — Telephone Encounter (Signed)
Needs to schedule appt to follow up on medications she was started on and for med changes she is requesting. There are currently no refills on the xanax and I am discontinuing the xanax and the flexeril per her request  Please ask her to bring the medications she is actually taking to her visit  She has no appointment scheduled.  She should have an appointment

## 2017-04-27 NOTE — Telephone Encounter (Signed)
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 Please advise

## 2017-04-28 ENCOUNTER — Telehealth: Payer: Self-pay | Admitting: Family Medicine

## 2017-04-28 ENCOUNTER — Encounter: Payer: Self-pay | Admitting: Family Medicine

## 2017-04-28 MED ORDER — FLUOXETINE HCL 20 MG PO TABS: 20.0000 mg | ORAL_TABLET | Freq: Every day | ORAL | 2 refills | Status: DC

## 2017-04-28 MED ORDER — METHOCARBAMOL 500 MG PO TABS: ORAL_TABLET | ORAL | 2 refills | Status: DC

## 2017-04-28 NOTE — Telephone Encounter (Signed)
pls let pt know that for her back pain  muscle spasm I am recommending instead of the flexeril, robaxin  1 at bedtime and have sent that inI also recommend  that she use Es tylenol 1  twice daily and the robaxin one at night. The fluoxetine was prescribed at the last visit taken daily I am refilling for an additional 2 months, and would like to re evaluate , I am also sending her an e message in case you are unable to speak with her

## 2017-05-01 ENCOUNTER — Encounter: Payer: Self-pay | Admitting: Family Medicine

## 2017-05-01 NOTE — Telephone Encounter (Signed)
Pt aware.

## 2017-05-01 NOTE — Telephone Encounter (Signed)
sch for 4-30

## 2017-05-02 ENCOUNTER — Ambulatory Visit (INDEPENDENT_AMBULATORY_CARE_PROVIDER_SITE_OTHER): Payer: BC Managed Care – PPO | Admitting: Family Medicine

## 2017-05-02 ENCOUNTER — Ambulatory Visit: Payer: Self-pay | Admitting: Family Medicine

## 2017-05-02 ENCOUNTER — Encounter: Payer: Self-pay | Admitting: Family Medicine

## 2017-05-02 VITALS — BP 132/82 | HR 85 | Resp 16 | Ht 61.0 in | Wt 245.0 lb

## 2017-05-02 DIAGNOSIS — M62830 Muscle spasm of back: Secondary | ICD-10-CM

## 2017-05-02 DIAGNOSIS — I10 Essential (primary) hypertension: Secondary | ICD-10-CM

## 2017-05-02 DIAGNOSIS — J029 Acute pharyngitis, unspecified: Secondary | ICD-10-CM | POA: Diagnosis not present

## 2017-05-02 DIAGNOSIS — N3001 Acute cystitis with hematuria: Secondary | ICD-10-CM | POA: Diagnosis not present

## 2017-05-02 DIAGNOSIS — F419 Anxiety disorder, unspecified: Secondary | ICD-10-CM | POA: Diagnosis not present

## 2017-05-02 DIAGNOSIS — F3289 Other specified depressive episodes: Secondary | ICD-10-CM

## 2017-05-02 DIAGNOSIS — Z6841 Body Mass Index (BMI) 40.0 and over, adult: Secondary | ICD-10-CM | POA: Diagnosis not present

## 2017-05-02 LAB — POCT URINALYSIS DIPSTICK
Appearance: NORMAL
Bilirubin, UA: NEGATIVE
Glucose, UA: NEGATIVE
KETONES UA: NEGATIVE
NITRITE UA: NEGATIVE
ODOR: NORMAL
PROTEIN UA: NEGATIVE
Spec Grav, UA: 1.015 (ref 1.010–1.025)
Urobilinogen, UA: 0.2 E.U./dL
pH, UA: 7 (ref 5.0–8.0)

## 2017-05-02 MED ORDER — FLUOXETINE HCL 40 MG PO CAPS: 40.0000 mg | ORAL_CAPSULE | Freq: Every day | ORAL | 3 refills | Status: DC

## 2017-05-02 MED ORDER — FLUCONAZOLE 150 MG PO TABS: 150.0000 mg | ORAL_TABLET | Freq: Once | ORAL | 0 refills | Status: AC

## 2017-05-02 MED ORDER — AZITHROMYCIN 250 MG PO TABS: ORAL_TABLET | ORAL | 0 refills | Status: DC

## 2017-05-02 NOTE — Assessment & Plan Note (Signed)
Controlled, no change in medication DASH diet and commitment to daily physical activity for a minimum of 30 minutes discussed and encouraged, as a part of hypertension management. The importance of attaining a healthy weight is also discussed.  BP/Weight 05/02/2017 02/09/2017 01/23/2017 08/17/2016 08/16/2016 02/29/2016 02/02/4386  Systolic BP 875 797 282 - 060 156 153  Diastolic BP 82 84 90 - 80 78 82  Wt. (Lbs) 245 - 245 233.6 232.5 229.04 229  BMI 46.29 - 46.29 44.14 43.93 43.28 43.27  Some encounter information is confidential and restricted. Go to Review Flowsheets activity to see all data.

## 2017-05-02 NOTE — Patient Instructions (Addendum)
F/u in 5. months, call if you need me sooner  Fasting lipid, chem 7, TSH 1 week prio to next vist You have improved as far as anxiety is concerned, I have increased medication dose  New medications for back b pain are as we discssed , no more xanax and flexeril anda letter oif current medication will be faxed to the number you provide  Urine shows no infection.  Blood pressure is excellent  Please work on weight loss through lifestyle change , call your insurance for any  Help you can get with weight watchers or gym membership  You are treated for  Acute sore throat   With a z pack, and fluconazole 1 tabletplease commit to daily aklkergy medication like claritin for the next 7 to 10 days  I will send in the ear drop you requested also

## 2017-05-03 LAB — POCT RAPID STREP A (OFFICE): Rapid Strep A Screen: NEGATIVE

## 2017-05-04 ENCOUNTER — Ambulatory Visit: Payer: Self-pay | Admitting: Family Medicine

## 2017-05-04 ENCOUNTER — Telehealth: Payer: Self-pay

## 2017-05-04 NOTE — Telephone Encounter (Signed)
Was seen on 4/30 and given an antibiotic (zpak) because she was starting to feel bad with sore throat and sinus symptoms and had an ulcer in her throat but negative strep. Started running a 101 temp yesterday and throat very sore. No fever today but non productive cough and sinus drainage. Taking robitussin and mucinex but needs a work note for yesterday and today. Wants to go back tomorrow if able. Can she get this?

## 2017-05-04 NOTE — Telephone Encounter (Signed)
No fever now per patient. Declined appt. Collected her work note

## 2017-05-04 NOTE — Telephone Encounter (Signed)
Scheduled

## 2017-05-04 NOTE — Telephone Encounter (Signed)
Yes, please give note, but if she is running a fever and not doing well, she needs to come in and be seen.  Please advise. Could schedule for tomorrow.

## 2017-05-05 ENCOUNTER — Telehealth: Payer: Self-pay | Admitting: Family Medicine

## 2017-05-05 ENCOUNTER — Ambulatory Visit: Payer: Self-pay | Admitting: Family Medicine

## 2017-05-05 ENCOUNTER — Encounter: Payer: Self-pay | Admitting: Family Medicine

## 2017-05-05 LAB — URINE CULTURE
MICRO NUMBER:: 90528105
SPECIMEN QUALITY: ADEQUATE

## 2017-05-05 NOTE — Telephone Encounter (Signed)
I am not sure what she needs.  She can have a note for work today if this was based upon a misunderstanding.  She can have a note for work today.  Please advise.  However, unless she is seen in the office she will not receive any subsequent notes for missing work.

## 2017-05-05 NOTE — Telephone Encounter (Signed)
Noted. We have no availability today.

## 2017-05-05 NOTE — Telephone Encounter (Signed)
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 I made her an appt but cancelled it later because Stephanie Cannon stated she was receiving a work note and did not need an appt because she had no fever. This morning patient called back stating she wanted an appt because she didn't go to work today because she is still sick (symptoms include- sore throat, headache) she denies having a fever. I told her the appt spots were filled and we no longer have the availability that we had yesterday.  Cb#: [phone]

## 2017-05-05 NOTE — Telephone Encounter (Signed)
Patient states Stephanie Cannon  told her yesterday that she can have a note for work today. She says she was given one for wed. & thurs.. I offered her an appt yesterday afternoon. She called this morning to make an appt but there was no more availability. I advised Urgent care, she said no.   Cb#: [phone]

## 2017-05-05 NOTE — Telephone Encounter (Signed)
Please call patient to discuss, she is requesting to talk to you.

## 2017-05-07 ENCOUNTER — Encounter: Payer: Self-pay | Admitting: Family Medicine

## 2017-05-07 ENCOUNTER — Other Ambulatory Visit: Payer: Self-pay | Admitting: Family Medicine

## 2017-05-07 ENCOUNTER — Telehealth: Payer: Self-pay | Admitting: Family Medicine

## 2017-05-07 DIAGNOSIS — N3001 Acute cystitis with hematuria: Secondary | ICD-10-CM | POA: Insufficient documentation

## 2017-05-07 MED ORDER — FLUCONAZOLE 150 MG PO TABS: 150.0000 mg | ORAL_TABLET | Freq: Once | ORAL | 0 refills | Status: AC

## 2017-05-07 MED ORDER — LEVOFLOXACIN 500 MG PO TABS: 500.0000 mg | ORAL_TABLET | Freq: Every day | ORAL | 0 refills | Status: DC

## 2017-05-07 NOTE — Assessment & Plan Note (Signed)
Greatly improved, increase fluoxetine due to inadequately controlled depression however

## 2017-05-07 NOTE — Assessment & Plan Note (Signed)
Treated with robaxin at bedtime

## 2017-05-07 NOTE — Progress Notes (Signed)
Stephanie Cannon     MRN: [id number]      DOB: April 08, 1970   HPI Stephanie Cannon is here for follow up and re-evaluation of chronic medical conditions, medication management and review of any available recent lab and radiology data.  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 Her GAD score is reviewed on  fluoxetine 20 mg, and though there is improvement there is need for further improvement, so the dose is increased to 40 mg, and she will continue robaxin 500 mg one at bedtime for back spasm along with topical muscle rubs. She also c/o  not feeling good with acute onset of chills x 1 day ,left ear discomfort and sore throat also swollen glands on left side of throat  but  no dcoumented fever.  ROS See HPI  Denies chest congestion, productive cough or wheezing. Denies chest pains, palpitations and leg swelling Denies abdominal pain, nausea, vomiting,diarrhea or constipation.   Denies dysuria, frequency, hesitancy or incontinence. Chronic back pain and stiffness worse at night. Denies headaches, seizures, numbness, or tingling. Denies skin break down or rash.   PE  BP 132/82   Pulse 85   Resp 16   Ht 5\' 1"  (1.549 m)   Wt 245 lb (111.1 kg)   SpO2 97%   BMI 46.29 kg/m   Patient alert and oriented and in no cardiopulmonary distress.  HEENT: No facial asymmetry, EOMI,   oropharynx pink and moist. Shallow .Ulcaer in left oropharyngeal area Neck supple tender left anterior cervical adenitis, TM clear  bilaterally, no sinus tendserness  Chest: Clear to auscultation bilaterally.  CVS: S1, S2 no murmurs, no S3.Regular rate.  ABD: Soft non tender.   Ext: No edema  MS: Adequate ROM spine, shoulders, hips and knees.  Skin: Intact, no ulcerations or rash noted.  Psych: Good eye contact, normal affect. Memory intact not anxious or depressed appearing.  CNS: CN 2-12 intact, power,  normal throughout.no focal deficits noted.   Assessment & Plan  Essential hypertension Controlled, no change in medication DASH diet and commitment to daily physical activity for a minimum of 30 minutes discussed and encouraged, as a part of hypertension management. The importance of attaining a healthy weight is also discussed.  BP/Weight 05/02/2017 02/09/2017 01/23/2017 08/17/2016 08/16/2016 02/29/2016 01/11/3233  Systolic BP 573 220 254 - 270 623 762  Diastolic BP 82 84 90 - 80 78 82  Wt. (Lbs) 245 - 245 233.6 232.5 229.04 229  BMI 46.29 - 46.29 44.14 43.93 43.28 43.27  Some encounter information is confidential and restricted. Go to Review Flowsheets activity to see all data.       Anxiety Greatly improved, increase fluoxetine due to inadequately controlled depression however  Pharyngitis, acute Shallow Ulcer in left oropharynx with tender cervical adenitis, rapid strep is negative, Z pack is prescribed, as pt reports acute onset of chills and malaise    Depression Uncharged on fluoxetine 20 mg , not  suicidal or homicidal increase dose of medication to 40 gm daily and review  Morbid obesity with body mass index of 50.0-59.9 in adult [facility]) unchnaged , which is depressing the patient, her eating is uncontrolledatient re-educated about  the importance of commitment to a  minimum of 150 minutes of exercise per week.  The importance of healthy food choices with portion control discussed. Encouraged to start a food diary, count calories and to consider  joining a support group. Sample diet sheets  offered. Goals set by the patient for the next several months.   Weight /BMI 05/02/2017 01/23/2017 08/17/2016  WEIGHT 245 lb 245 lb 233 lb 9.6 oz  HEIGHT 5\' 1"  5\' 1"  5\' 1"   BMI 46.29 kg/m2 46.29 kg/m2 44.14 kg/m2  Some encounter information is confidential and restricted. Go to Review Flowsheets activity to see all data.      Acute cystitis with hematuria C/o malodorous urine and UA is abnormal , will treat based on c/s eport

## 2017-05-07 NOTE — Telephone Encounter (Signed)
Please see the result note I sent to the pt, sh has a UTI and meds have been sent in,pls follow up with her on this , thanks. I see she was calling in when I was out about feeling sick withe a tempo

## 2017-05-07 NOTE — Assessment & Plan Note (Signed)
unchnaged , which is depressing the patient, her eating is uncontrolledatient re-educated about  the importance of commitment to a  minimum of 150 minutes of exercise per week.  The importance of healthy food choices with portion control discussed. Encouraged to start a food diary, count calories and to consider  joining a support group. Sample diet sheets offered. Goals set by the patient for the next several months.   Weight /BMI 05/02/2017 01/23/2017 08/17/2016  WEIGHT 245 lb 245 lb 233 lb 9.6 oz  HEIGHT 5\' 1"  5\' 1"  5\' 1"   BMI 46.29 kg/m2 46.29 kg/m2 44.14 kg/m2  Some encounter information is confidential and restricted. Go to Review Flowsheets activity to see all data.

## 2017-05-07 NOTE — Assessment & Plan Note (Signed)
Uncharged on fluoxetine 20 mg , not suicidal or homicidal increase dose of medication to 40 gm daily and review

## 2017-05-07 NOTE — Assessment & Plan Note (Addendum)
Shallow Ulcer in left oropharynx with tender cervical adenitis, rapid strep is negative, Z pack is prescribed, as pt reports acute onset of chills and malaise

## 2017-05-07 NOTE — Assessment & Plan Note (Signed)
C/o malodorous urine and UA is abnormal , will treat based on c/s eport

## 2017-05-08 NOTE — Telephone Encounter (Signed)
Work note sent to [employer]  Per patient

## 2017-05-08 NOTE — Telephone Encounter (Signed)
Patient back at work today (offered appt) but states she only needs a work note for Friday since she called multiple times for a same day appt  and was unable to get anyone on the phone

## 2017-05-08 NOTE — Telephone Encounter (Signed)
pls provide work excuse for Friday asshe has a documented uTI which was not treated and fax to her work

## 2017-05-11 ENCOUNTER — Telehealth: Payer: Self-pay | Admitting: Family Medicine

## 2017-05-11 ENCOUNTER — Encounter: Payer: Self-pay | Admitting: Family Medicine

## 2017-05-11 ENCOUNTER — Encounter (INDEPENDENT_AMBULATORY_CARE_PROVIDER_SITE_OTHER): Payer: Self-pay

## 2017-05-11 NOTE — Telephone Encounter (Signed)
Left patient a voicemail that she is scheduled with Dr.Marcellino Tuesday 05/16/17 at 1pm

## 2017-05-11 NOTE — Telephone Encounter (Signed)
Noted and you also sent a message

## 2017-05-17 ENCOUNTER — Other Ambulatory Visit: Payer: Self-pay | Admitting: Family Medicine

## 2017-06-14 ENCOUNTER — Other Ambulatory Visit: Payer: Self-pay | Admitting: Family Medicine

## 2017-06-29 ENCOUNTER — Other Ambulatory Visit: Payer: Self-pay | Admitting: Obstetrics and Gynecology

## 2017-06-29 DIAGNOSIS — Z1231 Encounter for screening mammogram for malignant neoplasm of breast: Secondary | ICD-10-CM

## 2017-06-30 ENCOUNTER — Other Ambulatory Visit: Payer: Self-pay | Admitting: Family Medicine

## 2017-07-20 ENCOUNTER — Other Ambulatory Visit: Payer: Self-pay | Admitting: Family Medicine

## 2017-07-20 ENCOUNTER — Encounter: Payer: Self-pay | Admitting: Family Medicine

## 2017-07-24 ENCOUNTER — Encounter: Payer: Self-pay | Admitting: Family Medicine

## 2017-07-24 ENCOUNTER — Other Ambulatory Visit: Payer: Self-pay | Admitting: Family Medicine

## 2017-07-24 MED ORDER — ALPRAZOLAM 1 MG PO TABS: ORAL_TABLET | ORAL | 2 refills | Status: DC

## 2017-07-25 ENCOUNTER — Other Ambulatory Visit: Payer: Self-pay | Admitting: Family Medicine

## 2017-07-25 ENCOUNTER — Telehealth: Payer: Self-pay

## 2017-07-25 ENCOUNTER — Encounter: Payer: Self-pay | Admitting: Family Medicine

## 2017-07-25 DIAGNOSIS — N3000 Acute cystitis without hematuria: Secondary | ICD-10-CM

## 2017-07-25 MED ORDER — SULFAMETHOXAZOLE-TRIMETHOPRIM 800-160 MG PO TABS: 1.0000 | ORAL_TABLET | Freq: Two times a day (BID) | ORAL | 0 refills | Status: DC

## 2017-07-25 MED ORDER — FLUCONAZOLE 150 MG PO TABS: 150.0000 mg | ORAL_TABLET | Freq: Once | ORAL | 0 refills | Status: AC

## 2017-07-25 NOTE — Progress Notes (Signed)
Septra  

## 2017-07-25 NOTE — Telephone Encounter (Signed)
Lab order [facility]

## 2017-08-07 ENCOUNTER — Other Ambulatory Visit: Payer: Self-pay | Admitting: Family Medicine

## 2017-08-11 ENCOUNTER — Telehealth: Payer: Self-pay

## 2017-08-11 NOTE — Telephone Encounter (Signed)
[facility] - Left Msg 

## 2017-08-21 ENCOUNTER — Other Ambulatory Visit: Payer: Self-pay | Admitting: Family Medicine

## 2017-08-22 ENCOUNTER — Telehealth: Payer: Self-pay | Admitting: Family Medicine

## 2017-08-22 ENCOUNTER — Telehealth: Payer: Self-pay

## 2017-08-22 ENCOUNTER — Other Ambulatory Visit: Payer: Self-pay | Admitting: Family Medicine

## 2017-08-22 ENCOUNTER — Encounter: Payer: Self-pay | Admitting: Family Medicine

## 2017-08-22 NOTE — Telephone Encounter (Signed)
Please call Phil at [facility] about the medication that was sent in for Ms Moise.

## 2017-08-22 NOTE — Telephone Encounter (Signed)
[facility] wanted to know if Stephanie Cannon should be on flexeril or robaxin. Has rx's for both

## 2017-08-22 NOTE — Telephone Encounter (Signed)
pls let him know discontinue robaxin, I have removed it from the med list, and the refills should already have run out

## 2017-08-23 ENCOUNTER — Encounter: Payer: Self-pay | Admitting: Family Medicine

## 2017-08-23 NOTE — Telephone Encounter (Signed)
Message sent to d/c robaxin

## 2017-09-04 ENCOUNTER — Telehealth: Payer: Self-pay

## 2017-09-04 NOTE — Telephone Encounter (Signed)
No telephone note to enter

## 2017-09-04 NOTE — Telephone Encounter (Signed)
Several attempts have been made to contact patient without success. Patient will be placed on the inactive list.  If services are needed again.  Please contact [facility] at [phone].    Information will be routed to the PCP and Dr. Hisada  

## 2017-09-12 ENCOUNTER — Ambulatory Visit (INDEPENDENT_AMBULATORY_CARE_PROVIDER_SITE_OTHER): Payer: BLUE CROSS/BLUE SHIELD | Admitting: Psychiatry

## 2017-09-12 ENCOUNTER — Encounter: Payer: Self-pay | Admitting: Family Medicine

## 2017-09-12 ENCOUNTER — Encounter (HOSPITAL_COMMUNITY): Payer: Self-pay | Admitting: Psychiatry

## 2017-09-12 ENCOUNTER — Ambulatory Visit (INDEPENDENT_AMBULATORY_CARE_PROVIDER_SITE_OTHER): Payer: BC Managed Care – PPO | Admitting: Family Medicine

## 2017-09-12 VITALS — BP 142/90 | HR 77 | Ht 62.0 in | Wt 247.0 lb

## 2017-09-12 VITALS — BP 130/82 | HR 79 | Resp 15 | Ht 62.0 in | Wt 249.0 lb

## 2017-09-12 DIAGNOSIS — Z87891 Personal history of nicotine dependence: Secondary | ICD-10-CM | POA: Diagnosis not present

## 2017-09-12 DIAGNOSIS — K219 Gastro-esophageal reflux disease without esophagitis: Secondary | ICD-10-CM

## 2017-09-12 DIAGNOSIS — M549 Dorsalgia, unspecified: Secondary | ICD-10-CM

## 2017-09-12 DIAGNOSIS — E785 Hyperlipidemia, unspecified: Secondary | ICD-10-CM | POA: Diagnosis not present

## 2017-09-12 DIAGNOSIS — Z6841 Body Mass Index (BMI) 40.0 and over, adult: Secondary | ICD-10-CM

## 2017-09-12 DIAGNOSIS — F411 Generalized anxiety disorder: Secondary | ICD-10-CM | POA: Diagnosis not present

## 2017-09-12 DIAGNOSIS — Z23 Encounter for immunization: Secondary | ICD-10-CM

## 2017-09-12 DIAGNOSIS — G4700 Insomnia, unspecified: Secondary | ICD-10-CM

## 2017-09-12 DIAGNOSIS — E559 Vitamin D deficiency, unspecified: Secondary | ICD-10-CM

## 2017-09-12 DIAGNOSIS — E8881 Metabolic syndrome: Secondary | ICD-10-CM

## 2017-09-12 DIAGNOSIS — I10 Essential (primary) hypertension: Secondary | ICD-10-CM

## 2017-09-12 DIAGNOSIS — M62830 Muscle spasm of back: Secondary | ICD-10-CM

## 2017-09-12 MED ORDER — TEMAZEPAM 15 MG PO CAPS: 15.0000 mg | ORAL_CAPSULE | Freq: Every evening | ORAL | 2 refills | Status: DC | PRN

## 2017-09-12 MED ORDER — FLUOXETINE HCL 40 MG PO CAPS: 40.0000 mg | ORAL_CAPSULE | Freq: Every day | ORAL | 3 refills | Status: DC

## 2017-09-12 MED ORDER — ALPRAZOLAM 1 MG PO TABS: 1.0000 mg | ORAL_TABLET | Freq: Every day | ORAL | 2 refills | Status: DC | PRN

## 2017-09-12 NOTE — Patient Instructions (Signed)
F/U in mid January, call if you need me sooner  Labs today    Meds are being sent in

## 2017-09-12 NOTE — Progress Notes (Signed)
[place] MD/PA/NP OP Progress Note  09/12/2017 3:22 PM Stephanie Cannon  MRN:  [id number]  Chief Complaint:  Chief Complaint    Depression; Anxiety; Follow-up     HPI: This patient is a 47 year old married black female who lives with her husband in [place].  She has 11 year old son who lives on his own.  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  The patient is referred by Dr. [name] for depression and anxiety.  The patient had seen me once in 2016.  At that time she had just lost a job and was very stressed and had to be taken out of work.  She was on a combination of Prozac and Xanax.  Unfortunately she never returned for follow-up.  The patient returns now stating that she is having several issues.  She states that everything in her life is going well.  She and her husband get along great and her son is doing well.  She is bounced around to different jobs but has been at this 1 for the last 1 year and feels like it is the right fit for her.  She is struggling however to sleep at night.  She takes Xanax 1 mg at bedtime and sleeps 3 to 4 hours and then wakes up.  She is stays awake and worries about the state of the world.  She thinks about things likes school shootings etc. and is hard for her to get back to sleep.  The patient also reports that she can stay focused at work this could be because she is not sleeping well at night.  Her mind wanders off all the time. When she gets up at night she binge eats.  She had bariatric surgery in 2016 and lost about 50 pounds but is now hit a plateau at 247.  I suggested that first we try to get her sleep improved and perhaps her focus will be better.  She is getting virtually no exercise and blames this on back surgery.  However I explained to her that even back problems improve with regular low impact exercise.  I suggested that she walk 20 to 30 minutes a day.  We can also start temazepam at bedtime for sleep.  If  improved sleep does not help her focus we will look at this next visit Visit Diagnosis:    ICD-10-CM   1. Generalized anxiety disorder F41.1     Past Psychiatric History: One prior visit here  Past Medical History:  Past Medical History:  Diagnosis Date  . Arthritis   . Breast discharge    left spont white discharge x 6 months  . Carpal tunnel syndrome    left, wears brace at night  . Chronic back pain   . GERD (gastroesophageal reflux disease)   . Hyperlipidemia   . Hypertension   . Obesity     Past Surgical History:  Procedure Laterality Date  . ABDOMINAL HYSTERECTOMY  2003   partial  . BREAST BIOPSY Left 2018   benign  . CARPAL TUNNEL RELEASE Right   . CESAREAN SECTION  1988  . CHOLECYSTECTOMY    . COLONOSCOPY N/A 09/21/2012   Procedure: COLONOSCOPY;  Surgeon: Danie Binder, MD;  Location: [facility];  Service: Endoscopy;  Laterality: N/A;  9:30  . LAPAROSCOPIC GASTRIC SLEEVE RESECTION WITH HIATAL HERNIA REPAIR N/A 06/23/2014   Procedure: LAPAROSCOPIC LYSIS OF ADHESIONS, GASTRIC SLEEVE RESECTION AND UPPER ENDO;  Surgeon: Alphonsa Overall, MD;  Location:  [facility];  Service: General;  Laterality: N/A;  . LAPAROSCOPIC LYSIS OF ADHESIONS  06/23/2014   Procedure: LAPAROSCOPIC LYSIS OF ADHESIONS;  Surgeon: Alphonsa Overall, MD;  Location: [facility];  Service: General;;  . LUMBAR FUSION  01/22/2014   L4 L5       DR Arnoldo Morale  . other     Vocal chord polyp removal  . 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 TONSILLECTOMY    . UPPER GI ENDOSCOPY  06/23/2014   Procedure: UPPER GI ENDOSCOPY;  Surgeon: Alphonsa Overall, MD;  Location: [facility];  Service: General;;    Family Psychiatric History: none  Family History:  Family History  Problem Relation Age of Onset  . Hypertension Mother   . Hypertension Father   . Hypertension Brother   . Cancer Maternal Aunt   . Cancer Maternal Uncle        liver  . Cancer Paternal Uncle        bone cancer  . Diabetes Maternal Grandmother   . Colon  cancer Neg Hx     Social History:  Social History   Socioeconomic History  . Marital status: Married    Spouse name: Not on file  . Number of children: Not on file  . Years of education: Not on file  . Highest education level: Not on file  Occupational History  . 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 Financial resource strain: Not on file  . Food insecurity:    Worry: Not on file    Inability: Not on file  . Transportation needs:    Medical: Not on file    Non-medical: Not on file  Tobacco Use  . Smoking status: Former Smoker    Types: Cigarettes  . Smokeless tobacco: Never Used  . Tobacco comment: quit smoking 2011  Substance and Sexual Activity  . Alcohol use: Yes    Comment: occasionally  . Drug use: No  . Sexual activity: Not on file  Lifestyle  . Physical activity:    Days per week: Not on file    Minutes per session: Not on file  . Stress: Not on file  Relationships  . Social connections:    Talks on phone: Not on file    Gets together: Not on file    Attends religious service: Not on file    Active member of club or organization: Not on file    Attends meetings of clubs or organizations: Not on file    Relationship status: Not on file  Other Topics Concern  . Not on file  Social History Narrative  . Not on file    Allergies:  Allergies  Allergen Reactions  . Codeine Itching  . Morphine Itching    Metabolic Disorder Labs: Lab Results  Component Value Date   HGBA1C 5.2 09/14/2015   MPG 103 09/14/2015   MPG 120 (H) 12/23/2013   No results found for: PROLACTIN Lab Results  Component Value Date   CHOL 194 09/14/2015   TRIG 61 09/14/2015   HDL 63 09/14/2015   CHOLHDL 3.1 09/14/2015   VLDL 12 09/14/2015   LDLCALC 119 09/14/2015   LDLCALC 128 (H) 12/23/2013   Lab Results  Component Value Date   TSH 2.74 09/14/2015   TSH 1.724 09/11/2012    Therapeutic Level Labs: No results  found for: LITHIUM No results found for: VALPROATE No components found  for:  CBMZ  Current Medications: Current Outpatient Medications  Medication Sig Dispense Refill  . ALPRAZolam (XANAX) 1 MG tablet Take 1 tablet (1 mg total) by mouth daily as needed for anxiety. 30 tablet 2  . cyclobenzaprine (FLEXERIL) 10 MG tablet TAKE ONE TABLET BY MOUTH 3 TIMES A DAY AS NEEDED FOR SPASMS. 90 tablet 0  . esomeprazole (NEXIUM) 40 MG capsule TAKE 1 CAPSULE DAILY DAILY. 30 capsule 3  . FLUoxetine (PROZAC) 40 MG capsule Take 1 capsule (40 mg total) by mouth daily. 90 capsule 3  . meloxicam (MOBIC) 15 MG tablet TAKE ONE TABLET BY MOUTH ONCE DAILY. 30 tablet 0  . Multiple Vitamins-Minerals (MULTIVITAMIN GUMMIES ADULT PO) Take 1 each by mouth 2 (two) times daily.    Marland Kitchen triamterene-hydrochlorothiazide (MAXZIDE) 75-50 MG tablet Take 1 tablet by mouth daily. 90 tablet 1  . temazepam (RESTORIL) 15 MG capsule Take 1 capsule (15 mg total) by mouth at bedtime as needed for sleep. 30 capsule 2   No current facility-administered medications for this visit.      Musculoskeletal: Strength & Muscle Tone: within normal limits Gait & Station: normal Patient leans: N/A  Psychiatric Specialty Exam: Review of Systems  Musculoskeletal: Positive for back pain.  Psychiatric/Behavioral: The patient is nervous/anxious and has insomnia.   All other systems reviewed and are negative.   Blood pressure (!) 142/90, pulse 77, height 5\' 2"  (1.575 m), weight 247 lb (112 kg), SpO2 99 %.Body mass index is 45.18 kg/m.  General Appearance: Casual, Neat and Well Groomed  Eye Contact:  Good  Speech:  Clear and Coherent  Volume:  Normal  Mood:  Anxious  Affect:  Appropriate and Congruent  Thought Process:  Goal Directed  Orientation:  Full (Time, Place, and Person)  Thought Content: Rumination   Suicidal Thoughts:  No  Homicidal Thoughts:  No  Memory:  Immediate;   Good Recent;   Good Remote;   Good  Judgement:  Good   Insight:  Fair  Psychomotor Activity:  Decreased  Concentration:  Concentration: Fair and Attention Span: Fair  Recall:  Good  Fund of Knowledge: Good  Language: Good  Akathisia:  No  Handed:  Right  AIMS (if indicated): not done  Assets:  Communication Skills Desire for Improvement Resilience Social Support Talents/Skills  ADL's:  Intact  Cognition: WNL  Sleep:  Poor   Screenings: GAD-7     Office Visit from 05/02/2017 in [facility] Office Visit from 01/23/2017 in [facility]  Total GAD-7 Score  7  19    PHQ2-9     Office Visit from 05/02/2017 in [facility] Office Visit from 01/23/2017 in [facility] Office Visit from 08/16/2016 in [facility] Office Visit from 02/29/2016 in [facility] Office Visit from 11/05/2014 in [facility]  PHQ-2 Total Score  3  3  0  0  0  PHQ-9 Total Score  13  13  -  -  0       Assessment and Plan: Patient is a 47 year old female with a history of depression and anxiety.  More recently she is developed insomnia and difficulty focusing.  She mentions binge eating but this is mostly during the night while she is not sleeping.  I think we need to work on getting her sleep improved.  She will start Restoril 15 mg at bedtime to begin with.  She will continue Prozac 40 mg daily for depression and use Xanax 1 mg  during the day if needed for anxiety.  She will return to see me in 4 weeks   Levonne Spiller, MD 09/12/2017, 3:22 PM

## 2017-09-13 ENCOUNTER — Encounter: Payer: Self-pay | Admitting: Family Medicine

## 2017-09-13 ENCOUNTER — Other Ambulatory Visit: Payer: Self-pay | Admitting: Family Medicine

## 2017-09-13 LAB — LIPID PANEL
Cholesterol: 225 mg/dL — ABNORMAL HIGH (ref <200)
HDL: 55 mg/dL (ref >50)
LDL Cholesterol (Calc): 145 mg/dL (calc) — ABNORMAL HIGH
Non-HDL Cholesterol (Calc): 170 mg/dL (calc) — ABNORMAL HIGH (ref <130)
TRIGLYCERIDES: 123 mg/dL (ref <150)
Total CHOL/HDL Ratio: 4.1 (calc) (ref <5.0)

## 2017-09-13 LAB — COMPLETE METABOLIC PANEL WITH GFR
AG Ratio: 1.3 (calc) (ref 1.0–2.5)
ALBUMIN MSPROF: 4.2 g/dL (ref 3.6–5.1)
ALT: 18 U/L (ref 6–29)
AST: 21 U/L (ref 10–35)
Alkaline phosphatase (APISO): 90 U/L (ref 33–115)
BUN: 15 mg/dL (ref 7–25)
CALCIUM: 9.7 mg/dL (ref 8.6–10.2)
CO2: 31 mmol/L (ref 20–32)
Chloride: 99 mmol/L (ref 98–110)
Creat: 0.91 mg/dL (ref 0.50–1.10)
GFR, EST NON AFRICAN AMERICAN: 75 mL/min/{1.73_m2} (ref >=60)
GFR, Est African American: 86 mL/min/{1.73_m2} (ref >=60)
GLOBULIN: 3.3 g/dL (ref 1.9–3.7)
GLUCOSE: 78 mg/dL (ref 65–99)
Potassium: 3.2 mmol/L — ABNORMAL LOW (ref 3.5–5.3)
SODIUM: 140 mmol/L (ref 135–146)
Total Bilirubin: 0.4 mg/dL (ref 0.2–1.2)
Total Protein: 7.5 g/dL (ref 6.1–8.1)

## 2017-09-13 LAB — CBC
HEMATOCRIT: 43.7 % (ref 35.0–45.0)
HEMOGLOBIN: 14.6 g/dL (ref 11.7–15.5)
MCH: 29.6 pg (ref 27.0–33.0)
MCHC: 33.4 g/dL (ref 32.0–36.0)
MCV: 88.5 fL (ref 80.0–100.0)
MPV: 9.8 fL (ref 7.5–12.5)
Platelets: 274 10*3/uL (ref 140–400)
RBC: 4.94 10*6/uL (ref 3.80–5.10)
RDW: 13.1 % (ref 11.0–15.0)
WBC: 5.8 10*3/uL (ref 3.8–10.8)

## 2017-09-13 LAB — HEMOGLOBIN A1C
HEMOGLOBIN A1C: 5.4 %{Hb} (ref <5.7)
Mean Plasma Glucose: 108 (calc)
eAG (mmol/L): 6 (calc)

## 2017-09-13 LAB — TSH: TSH: 0.92 m[IU]/L

## 2017-09-13 LAB — VITAMIN D 25 HYDROXY (VIT D DEFICIENCY, FRACTURES): Vit D, 25-Hydroxy: 27 ng/mL — ABNORMAL LOW (ref 30–100)

## 2017-09-13 MED ORDER — POTASSIUM CHLORIDE CRYS ER 20 MEQ PO TBCR: 20.0000 meq | EXTENDED_RELEASE_TABLET | Freq: Every day | ORAL | 1 refills | Status: DC

## 2017-09-13 MED ORDER — CYCLOBENZAPRINE HCL 10 MG PO TABS: 10.0000 mg | ORAL_TABLET | Freq: Three times a day (TID) | ORAL | 4 refills | Status: DC | PRN

## 2017-09-13 MED ORDER — ESOMEPRAZOLE MAGNESIUM 40 MG PO CPDR: 40.0000 mg | DELAYED_RELEASE_CAPSULE | Freq: Every day | ORAL | 1 refills | Status: DC

## 2017-09-13 MED ORDER — TRIAMTERENE-HCTZ 75-50 MG PO TABS: 1.0000 | ORAL_TABLET | Freq: Every day | ORAL | 1 refills | Status: DC

## 2017-09-13 NOTE — Progress Notes (Signed)
klor con  

## 2017-09-15 ENCOUNTER — Other Ambulatory Visit (HOSPITAL_COMMUNITY): Payer: Self-pay | Admitting: Psychiatry

## 2017-09-15 ENCOUNTER — Encounter: Payer: Self-pay | Admitting: Family Medicine

## 2017-09-15 ENCOUNTER — Telehealth (HOSPITAL_COMMUNITY): Payer: Self-pay | Admitting: *Deleted

## 2017-09-15 MED ORDER — TRAZODONE HCL 50 MG PO TABS: 50.0000 mg | ORAL_TABLET | Freq: Every day | ORAL | 2 refills | Status: DC

## 2017-09-15 NOTE — Assessment & Plan Note (Signed)
Chronic back pain and spasm, continue flexeril and meloxicam as needed

## 2017-09-15 NOTE — Telephone Encounter (Signed)
I have sent trazodone back in

## 2017-09-15 NOTE — Assessment & Plan Note (Signed)
The increased risk of cardiovascular disease associated with this diagnosis, and the need to consistently work on lifestyle to change this is discussed. Following  a  heart healthy diet ,commitment to 30 minutes of exercise at least 5 days per week, as well as control of blood sugar and cholesterol , and achieving a healthy weight are all the areas to be addressed .  

## 2017-09-15 NOTE — Progress Notes (Signed)
Stephanie Cannon     MRN: [id number]      DOB: 11-01-1970   HPI [name] is here for follow up and re-evaluation of chronic medical conditions, medication management and review of any available recent lab and radiology data.  Preventive health is updated, specifically  Cancer screening and Immunization.   Has an appointment with mental health today, states unable to sleep and eats all night with ongoing weight  Gain which is a concern of hers The PT denies any adverse reactions to current medications since the last visit.    ROS Denies recent fever or chills. Denies sinus pressure, nasal congestion, ear pain or sore throat. Denies chest congestion, productive cough or wheezing. Denies chest pains, palpitations and leg swelling Denies abdominal pain, nausea, vomiting,diarrhea or constipation.   Denies dysuria, frequency, hesitancy or incontinence. C/o chronic back pain Denies headaches, seizures, numbness, or tingling. Denies uncontrolled depression,does c/o  anxiety and  insomnia. Denies skin break down or rash.   PE  BP 130/82   Pulse 79   Resp 15   Ht 5\' 2"  (1.575 m)   Wt 249 lb (112.9 kg)   SpO2 97%   BMI 45.54 kg/m   Patient alert and oriented and in no cardiopulmonary distress.  HEENT: No facial asymmetry, EOMI,   oropharynx pink and moist.  Neck supple no JVD, no mass.  Chest: Clear to auscultation bilaterally.  CVS: S1, S2 no murmurs, no S3.Regular rate.  ABD: Soft non tender.   Ext: No edema  MS: Adequate ROM spine, shoulders, hips and knees.  Skin: Intact, no ulcerations or rash noted.  Psych: Good eye contact, normal affect. Memory intact not anxious or depressed appearing.  CNS: CN 2-12 intact, power,  normal throughout.no focal deficits noted.   Assessment & Plan  Essential hypertension Controlled, no change in medication DASH diet and commitment to daily physical activity for a minimum of 30 minutes discussed and encouraged, as a part of  hypertension management. The importance of attaining a healthy weight is also discussed.  BP/Weight 09/12/2017 05/02/2017 02/09/2017 01/23/2017 08/17/2016 08/16/2016 06/09/3014  Systolic BP 010 932 355 732 - 202 542  Diastolic BP 82 82 84 90 - 80 78  Wt. (Lbs) 249 245 - 245 233.6 232.5 229.04  BMI 45.54 46.29 - 46.29 44.14 43.93 43.28  Some encounter information is confidential and restricted. Go to Review Flowsheets activity to see all data.       GERD Controlled, no change in medication. Encouraged to stop eating at night and work on weight loss to reduce symptoms  Morbid obesity with BMI of 45.0-49.9, adult ([place]) Deteriorated. Patient re-educated about  the importance of commitment to a  minimum of 150 minutes of exercise per week.  The importance of healthy food choices with portion control discussed. Encouraged to start a food diary, count calories and to consider  joining a support group. Sample diet sheets offered. Goals set by the patient for the next several months.   Weight /BMI 09/12/2017 05/02/2017 01/23/2017  WEIGHT 249 lb 245 lb 245 lb  HEIGHT 5\' 2"  5\' 1"  5\' 1"   BMI 45.54 kg/m2 46.29 kg/m2 46.29 kg/m2  Some encounter information is confidential and restricted. Go to Review Flowsheets activity to see all data.      Hyperlipidemia LDL goal <100 Deteriorated , needs to lower fat intake to reduce CV risk Hyperlipidemia:Low fat diet discussed and encouraged.   Lipid Panel  Lab Results  Component Value Date  CHOL 225 (H) 09/12/2017   HDL 55 09/12/2017   LDLCALC 145 (H) 09/12/2017   TRIG 123 09/12/2017   CHOLHDL 4.1 67/25/5001       Metabolic syndrome X The increased risk of cardiovascular disease associated with this diagnosis, and the need to consistently work on lifestyle to change this is discussed. Following  a  heart healthy diet ,commitment to 30 minutes of exercise at least 5 days per week, as well as control of blood sugar and cholesterol , and achieving a  healthy weight are all the areas to be addressed .   Spasm of muscle of lower back Chronic back pain and spasm, continue flexeril and meloxicam as needed

## 2017-09-15 NOTE — Assessment & Plan Note (Signed)
Controlled, no change in medication DASH diet and commitment to daily physical activity for a minimum of 30 minutes discussed and encouraged, as a part of hypertension management. The importance of attaining a healthy weight is also discussed.  BP/Weight 09/12/2017 05/02/2017 02/09/2017 01/23/2017 08/17/2016 08/16/2016 4/66/5993  Systolic BP 570 177 939 030 - 092 330  Diastolic BP 82 82 84 90 - 80 78  Wt. (Lbs) 249 245 - 245 233.6 232.5 229.04  BMI 45.54 46.29 - 46.29 44.14 43.93 43.28  Some encounter information is confidential and restricted. Go to Review Flowsheets activity to see all data.

## 2017-09-15 NOTE — Assessment & Plan Note (Signed)
Deteriorated. Patient re-educated about  the importance of commitment to a  minimum of 150 minutes of exercise per week.  The importance of healthy food choices with portion control discussed. Encouraged to start a food diary, count calories and to consider  joining a support group. Sample diet sheets offered. Goals set by the patient for the next several months.   Weight /BMI 09/12/2017 05/02/2017 01/23/2017  WEIGHT 249 lb 245 lb 245 lb  HEIGHT 5\' 2"  5\' 1"  5\' 1"   BMI 45.54 kg/m2 46.29 kg/m2 46.29 kg/m2  Some encounter information is confidential and restricted. Go to Review Flowsheets activity to see all data.

## 2017-09-15 NOTE — Telephone Encounter (Signed)
Dr Harrington Challenger Patient is asking for a doctor note for missing work on Friday

## 2017-09-15 NOTE — Assessment & Plan Note (Signed)
Deteriorated , needs to lower fat intake to reduce CV risk Hyperlipidemia:Low fat diet discussed and encouraged.   Lipid Panel  Lab Results  Component Value Date   CHOL 225 (H) 09/12/2017   HDL 55 09/12/2017   LDLCALC 145 (H) 09/12/2017   TRIG 123 09/12/2017   CHOLHDL 4.1 09/12/2017

## 2017-09-15 NOTE — Assessment & Plan Note (Signed)
Controlled, no change in medication. Encouraged to stop eating at night and work on weight loss to reduce symptoms

## 2017-09-15 NOTE — Telephone Encounter (Signed)
Dr Harrington Challenger Patient called stating that the last few night since starting the Temazepam (RESTORIL) 15 MG capsule That it isn't working. She's been unable to rest & today called out of work because she is so tired. Feeling worn out

## 2017-09-18 ENCOUNTER — Encounter (HOSPITAL_COMMUNITY): Payer: Self-pay | Admitting: Psychiatry

## 2017-09-18 NOTE — Telephone Encounter (Signed)
done

## 2017-09-20 ENCOUNTER — Encounter: Payer: Self-pay | Admitting: Family Medicine

## 2017-09-20 ENCOUNTER — Other Ambulatory Visit: Payer: Self-pay | Admitting: Family Medicine

## 2017-09-20 MED ORDER — MONTELUKAST SODIUM 10 MG PO TABS: 10.0000 mg | ORAL_TABLET | Freq: Every day | ORAL | 3 refills | Status: DC

## 2017-09-20 MED ORDER — BENZONATATE 100 MG PO CAPS: 100.0000 mg | ORAL_CAPSULE | Freq: Two times a day (BID) | ORAL | 0 refills | Status: DC | PRN

## 2017-09-20 NOTE — Progress Notes (Signed)
singulair  

## 2017-09-22 ENCOUNTER — Ambulatory Visit: Payer: Self-pay

## 2017-09-25 ENCOUNTER — Ambulatory Visit
Admission: RE | Admit: 2017-09-25 | Discharge: 2017-09-25 | Disposition: A | Discharge status: Home / Self Care | Payer: BC Managed Care – PPO | Source: Ambulatory Visit | Attending: Obstetrics and Gynecology | Admitting: Obstetrics and Gynecology

## 2017-09-25 DIAGNOSIS — Z1231 Encounter for screening mammogram for malignant neoplasm of breast: Secondary | ICD-10-CM

## 2017-09-28 ENCOUNTER — Encounter: Payer: Self-pay | Admitting: Family Medicine

## 2017-10-02 ENCOUNTER — Telehealth: Payer: Self-pay | Admitting: Family Medicine

## 2017-10-02 ENCOUNTER — Other Ambulatory Visit: Payer: Self-pay | Admitting: Family Medicine

## 2017-10-02 ENCOUNTER — Telehealth: Payer: Self-pay

## 2017-10-02 DIAGNOSIS — E785 Hyperlipidemia, unspecified: Secondary | ICD-10-CM

## 2017-10-02 DIAGNOSIS — I10 Essential (primary) hypertension: Secondary | ICD-10-CM

## 2017-10-02 DIAGNOSIS — E559 Vitamin D deficiency, unspecified: Secondary | ICD-10-CM

## 2017-10-02 MED ORDER — POTASSIUM CHLORIDE CRYS ER 20 MEQ PO TBCR: 20.0000 meq | EXTENDED_RELEASE_TABLET | Freq: Two times a day (BID) | ORAL | 5 refills | Status: DC

## 2017-10-02 MED ORDER — ERGOCALCIFEROL 1.25 MG (50000 UT) PO CAPS: 50000.0000 [IU] | ORAL_CAPSULE | ORAL | 3 refills | Status: DC

## 2017-10-02 NOTE — Progress Notes (Signed)
klor con increased

## 2017-10-02 NOTE — Telephone Encounter (Signed)
Labs ordered, printed, mailed to patient with instructions to be drawn 1 week prior to Jan appt.

## 2017-10-02 NOTE — Telephone Encounter (Signed)
Please order fasting lipid, cmp and EGFR, Vitamin D to be drawn 1 week before her January appt, and mail to her, thanks, I rsponded to her  message today  Dx hyperlipid, and vit D def and hTN

## 2017-10-02 NOTE — Telephone Encounter (Signed)
Labs ordered, printed, and mailed to patient.

## 2017-10-03 ENCOUNTER — Encounter (INDEPENDENT_AMBULATORY_CARE_PROVIDER_SITE_OTHER): Payer: BLUE CROSS/BLUE SHIELD

## 2017-10-11 ENCOUNTER — Encounter: Payer: Self-pay | Admitting: Family Medicine

## 2017-10-12 ENCOUNTER — Ambulatory Visit (HOSPITAL_COMMUNITY): Payer: Self-pay | Admitting: Psychiatry

## 2017-10-17 ENCOUNTER — Encounter: Payer: Self-pay | Admitting: Family Medicine

## 2017-10-17 ENCOUNTER — Other Ambulatory Visit: Payer: Self-pay | Admitting: Family Medicine

## 2017-10-17 DIAGNOSIS — N390 Urinary tract infection, site not specified: Secondary | ICD-10-CM

## 2017-10-17 NOTE — Progress Notes (Signed)
amb urology  

## 2017-10-30 ENCOUNTER — Encounter: Payer: Self-pay | Admitting: Family Medicine

## 2017-10-31 ENCOUNTER — Telehealth: Payer: Self-pay | Admitting: Family Medicine

## 2017-10-31 NOTE — Telephone Encounter (Signed)
error 

## 2017-11-01 ENCOUNTER — Encounter: Payer: Self-pay | Admitting: Family Medicine

## 2017-11-01 ENCOUNTER — Ambulatory Visit (INDEPENDENT_AMBULATORY_CARE_PROVIDER_SITE_OTHER): Payer: Self-pay

## 2017-11-01 ENCOUNTER — Ambulatory Visit (INDEPENDENT_AMBULATORY_CARE_PROVIDER_SITE_OTHER): Payer: BLUE CROSS/BLUE SHIELD | Admitting: Orthopaedic Surgery

## 2017-11-01 ENCOUNTER — Ambulatory Visit (INDEPENDENT_AMBULATORY_CARE_PROVIDER_SITE_OTHER): Payer: BC Managed Care – PPO | Admitting: Family Medicine

## 2017-11-01 ENCOUNTER — Encounter (INDEPENDENT_AMBULATORY_CARE_PROVIDER_SITE_OTHER): Payer: Self-pay | Admitting: Orthopaedic Surgery

## 2017-11-01 ENCOUNTER — Other Ambulatory Visit: Payer: Self-pay | Admitting: Family Medicine

## 2017-11-01 VITALS — BP 116/84 | HR 82 | Ht 61.0 in | Wt 237.0 lb

## 2017-11-01 VITALS — BP 126/82 | HR 100 | Resp 12 | Ht 61.0 in | Wt 238.0 lb

## 2017-11-01 DIAGNOSIS — N3090 Cystitis, unspecified without hematuria: Secondary | ICD-10-CM

## 2017-11-01 DIAGNOSIS — M79675 Pain in left toe(s): Secondary | ICD-10-CM | POA: Insufficient documentation

## 2017-11-01 DIAGNOSIS — I10 Essential (primary) hypertension: Secondary | ICD-10-CM

## 2017-11-01 DIAGNOSIS — M25562 Pain in left knee: Secondary | ICD-10-CM | POA: Diagnosis not present

## 2017-11-01 DIAGNOSIS — W19XXXA Unspecified fall, initial encounter: Secondary | ICD-10-CM

## 2017-11-01 DIAGNOSIS — M79674 Pain in right toe(s): Secondary | ICD-10-CM

## 2017-11-01 DIAGNOSIS — R350 Frequency of micturition: Secondary | ICD-10-CM | POA: Diagnosis not present

## 2017-11-01 DIAGNOSIS — R8290 Unspecified abnormal findings in urine: Secondary | ICD-10-CM | POA: Diagnosis not present

## 2017-11-01 DIAGNOSIS — Z6841 Body Mass Index (BMI) 40.0 and over, adult: Secondary | ICD-10-CM

## 2017-11-01 LAB — POCT URINALYSIS DIPSTICK
Bilirubin, UA: NEGATIVE
Glucose, UA: NEGATIVE
Ketones, UA: NEGATIVE
NITRITE UA: POSITIVE
PH UA: 5.5 (ref 5.0–8.0)
PROTEIN UA: POSITIVE — AB
Spec Grav, UA: 1.025 (ref 1.010–1.025)
UROBILINOGEN UA: 0.2 U/dL

## 2017-11-01 MED ORDER — CIPROFLOXACIN HCL 500 MG PO TABS: 500.0000 mg | ORAL_TABLET | Freq: Two times a day (BID) | ORAL | 0 refills | Status: DC

## 2017-11-01 MED ORDER — FLUCONAZOLE 150 MG PO TABS: ORAL_TABLET | ORAL | 0 refills | Status: AC

## 2017-11-01 NOTE — Progress Notes (Signed)
Office Visit Note   Patient: Stephanie Cannon           Date of Birth: March 11, 1970           MRN: [id number] Visit Date: 11/01/2017              Requested by: Fayrene Helper, [place], [address], [place] [address] PCP: Fayrene Helper, MD   Assessment & Plan: Visit Diagnoses:  1. 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  No acute change by x-ray.  I have suggested good comfortable shoes and time.  Consider cortisone injection if no improvement.  Also bruised the anterior aspect of her left knee without any loss of motion or function.  Suspect this will resolve over time as well.  To reevaluate over the next 3 to 4 weeks if no improvement and consider films of her knee if still an issue and consider cortisone injection to either of the great toe metatarsal phalangeal joints  Follow-Up Instructions: Return if symptoms worsen or fail to improve.   Orders:  Orders Placed This Encounter  Procedures  . XR Toe Great Right  . XR Toe Great Left   No orders of the defined types were placed in this encounter.     Procedures: No procedures performed   Clinical Data: No additional findings.   Subjective: Chief Complaint  Patient presents with  . New Patient (Initial Visit)    10/30/17 PT FELL ON PREPATELLAR AREA, AREA IS SENSITIVE BENDING KNEE CAUSES PAIN IN L CALF AND L GREAT TOE, WALKING DOSENT HURT KNEE OR TOE  Mrs. Drumheller visited the office for evaluation of injury she sustained in a fall at home 2 days ago.  She missed the very last step on her stairs and sustained hyperextension injuries to both great toes.  She was wearing shoes but notes they were not "much support".  She also landed on the anterior aspect of her left knee.  She is had some discomfort with dorsiflexion and plantarflexion of the great toes.  Not had much swelling or ecchymosis.  No  numbness or tingling related.  Some local tenderness directly over the patella tendon and tibial tubercle of the left knee but does not use any ambulatory aid nor does she limp.  has tried over-the-counter medicines HPI  Review of Systems  Constitutional: Negative for fatigue and fever.  HENT: Negative for ear pain.   Eyes: Negative for pain.  Respiratory: Negative for cough and shortness of breath.   Cardiovascular: Negative for leg swelling.  Gastrointestinal: Negative for constipation and diarrhea.  Genitourinary: Negative for difficulty urinating.  Musculoskeletal: Positive for back pain. Negative for neck pain.  Skin: Negative for rash.  Allergic/Immunologic: Negative for food allergies.  Neurological: Positive for weakness. Negative for numbness.  Psychiatric/Behavioral: Positive for sleep disturbance.     Objective: Vital Signs: BP 116/84 (BP Location: Left Arm, Patient Position: Sitting, Cuff Size: Normal)   Pulse 82   Ht 5\' 1"  (1.549 m)   Wt 237 lb (107.5 kg)   BMI 44.78 kg/m   Physical Exam  Constitutional: She is oriented to person, place, and time. She appears well-developed and well-nourished.  HENT:  Mouth/Throat: Oropharynx is clear and moist.  Eyes: Pupils are equal, round, and reactive to light. EOM are normal.  Pulmonary/Chest: Effort normal.  Neurological: She is alert and oriented to person,  place, and time.  Skin: Skin is warm and dry.  Psychiatric: She has a normal mood and affect. Her behavior is normal.    Ortho Exam awake alert and oriented x3.  Comfortable sitting.  Some decreased dorsiflexion of both great toes more on the left than the right at the metatarsal phalangeal joint.  Palpable osteophytes over the metatarsal head of the left great toe and a area of thickened skin with ecchymosis no obvious deformity.  No bunion.  Good capillary refill.  Has about 30 degrees of dorsiflexion.  On the right great toe there is a little bit more dorsiflexion.  very minimal pain over the dorsal aspect of the metatarsal phalangeal joint.  No instability.  Skin intact. Some local tenderness directly over the tibial tubercle region of her left knee.  She had full quick extension and in flexion with a little bit of discomfort but no instability.  Patella tendon intact.  No obvious effusion.  No pain with patella motion.  No medial lateral joint pain.Marland Kitchen  Specialty Comments:  No specialty comments available.  Imaging: Xr Toe Great Left  Result Date: 11/01/2017 Of the left foot were obtained in several projections.  There is considerable degenerative change at the metatarsal phalangeal joint of the great toe.  There is a relatively large dorsal spur which will limit dorsiflexion of the toe.  Joint space is still maintained but somewhat irregular.  No acute changes.  There are subchondral cysts in the metatarsal head  Xr Toe Great Right  Result Date: 11/01/2017 Films of the right foot were obtained in several projections.  There is some minus arthritic change of the great toe metatarsal phalangeal joint with small osteophytes.  Some prominence of the metatarsal head dorsally.  No acute changes.  No ectopic calcification.  Is nice and straight without evidence of hallux valgus    PMFS History: Patient Active Problem List   Diagnosis Date Noted  . Great toe pain, right 11/01/2017  . Great toe pain, left 11/01/2017  . Knee pain, left anterior 11/01/2017  . Cystitis 03/01/2016  . Depression 09/22/2014  . Insomnia 09/22/2014  . Morbid obesity with BMI of 45.0-49.9, adult ([facility]) 06/23/2014  . Spondylolisthesis of lumbar region 01/22/2014  . Urinary incontinence without sensory awareness 07/19/2013  . Accessory skin tags 04/03/2013  . Prediabetes 12/17/2012  . Metabolic syndrome X 84/16/6063  . Hot flashes due to surgical menopause 12/06/2011  . Anxiety 03/01/2011  . Hyperlipidemia LDL goal <100 07/08/2010  . HEMORRHOIDS 04/27/2009  . CARPAL TUNNEL  SYNDROME 06/26/2007  . Essential hypertension 06/26/2007  . GERD 06/26/2007  . Spasm of muscle of lower back 06/26/2007   Past Medical History:  Diagnosis Date  . Arthritis   . Breast discharge    left spont white discharge x 6 months  . Carpal tunnel syndrome    left, wears brace at night  . Chronic back pain   . GERD (gastroesophageal reflux disease)   . Hyperlipidemia   . Hypertension   . Obesity     Family History  Problem Relation Age of Onset  . Hypertension Mother   . Hypertension Father   . Hypertension Brother   . Cancer Maternal Aunt   . Cancer Maternal Uncle        liver  . Cancer Paternal Uncle        bone cancer  . Diabetes Maternal Grandmother   . Colon cancer Neg Hx     Past Surgical History:  Procedure Laterality  Date  . ABDOMINAL HYSTERECTOMY  2003   partial  . BREAST BIOPSY Left 2018   benign  . CARPAL TUNNEL RELEASE Right   . CESAREAN SECTION  1988  . CHOLECYSTECTOMY    . COLONOSCOPY N/A 09/21/2012   Procedure: COLONOSCOPY;  Surgeon: Danie Binder, MD;  Location: [facility];  Service: Endoscopy;  Laterality: N/A;  9:30  . LAPAROSCOPIC GASTRIC SLEEVE RESECTION WITH HIATAL HERNIA REPAIR N/A 06/23/2014   Procedure: LAPAROSCOPIC LYSIS OF ADHESIONS, GASTRIC SLEEVE RESECTION AND UPPER ENDO;  Surgeon: Alphonsa Overall, MD;  Location: [facility];  Service: General;  Laterality: N/A;  . LAPAROSCOPIC LYSIS OF ADHESIONS  06/23/2014   Procedure: LAPAROSCOPIC LYSIS OF ADHESIONS;  Surgeon: Alphonsa Overall, MD;  Location: [facility];  Service: General;;  . LUMBAR FUSION  01/22/2014   L4 L5       DR Arnoldo Morale  . other     Vocal chord polyp removal  . 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 TONSILLECTOMY    . UPPER GI ENDOSCOPY  06/23/2014   Procedure: UPPER GI ENDOSCOPY;  Surgeon: Alphonsa Overall, MD;  Location: [facility];  Service: General;;   Social History   Occupational History  . 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 Smoking status: Former Smoker    Types: Cigarettes  . Smokeless tobacco: Never Used  . Tobacco comment: quit smoking 2011  Substance and Sexual Activity  . Alcohol use: Yes    Comment: occasionally  . Drug use: No  . Sexual activity: Not on file

## 2017-11-01 NOTE — Assessment & Plan Note (Signed)
Improved. Pt applauded on succesful weight loss through lifestyle change, and encouraged to continue same. Weight loss goal set for the next several months.  

## 2017-11-01 NOTE — Assessment & Plan Note (Signed)
Controlled, no change in medication DASH diet and commitment to daily physical activity for a minimum of 30 minutes discussed and encouraged, as a part of hypertension management. The importance of attaining a healthy weight is also discussed.  BP/Weight 11/01/2017 09/12/2017 05/02/2017 02/09/2017 01/23/2017 08/17/2016 7/49/4496  Systolic BP 759 163 846 659 935 - 701  Diastolic BP 82 82 82 84 90 - 80  Wt. (Lbs) 238 249 245 - 245 233.6 232.5  BMI 44.97 45.54 46.29 - 46.29 44.14 43.93  Some encounter information is confidential and restricted. Go to Review Flowsheets activity to see all data.

## 2017-11-01 NOTE — Progress Notes (Signed)
   Stephanie Cannon     MRN: [id number]      DOB: [birth year]   HPI Stephanie Cannon is here after falling having missed the last step of a staircase on 10/30/2017 . [place] forward on her bags, c/o bilateral great toe pain, distal , and mainly tender swelling over left knee distal , limits mobility of leg. Bruised, no lacerations. Went to work the same day, worsened through the day, stayed home yesterday and has been alternating cold and heat, also elevated leg. Flexeril for pain being used. Pain on knee is 4 , was an 8 yesterday, cannot get on her knee to get up to bed. Recurrent UTI symptoms, just completed 10 day course of antibiotic and is symptomatic, frequency and odor, denies fever and flank pain    ROS Denies recent fever or chills. Denies sinus pressure, nasal congestion, ear pain or sore throat. Denies chest congestion, productive cough or wheezing. Denies chest pains, palpitations and leg swelling Denies abdominal pain, nausea, vomiting,diarrhea or constipation.   . Denies headaches, seizures, numbness, or tingling. Denies depression, anxiety or insomnia. Denies skin break down or rash.   PE  BP 126/82 (BP Location: Left Arm, Patient Position: Sitting, Cuff Size: Large)   Pulse 100   Resp 12   Ht 5\' 1"  (1.549 m)   Wt 238 lb (108 kg)   SpO2 98% Comment: room air  BMI 44.97 kg/m   Patient alert and oriented and in no cardiopulmonary distress.  HEENT: No facial asymmetry, EOMI,   oropharynx pink and moist.  Neck supple no JVD, no mass.  Chest: Clear to auscultation bilaterally.  CVS: S1, S2 no murmurs, no S3.Regular rate.  ABD: Soft non tender. No renal angle tenderness, mild suprapubic tenderness  Ext: No edema  MS: Adequate ROM spine, shoulders, hips and  Reduced in left knee which is tender with mildly erythematous swelling just distal to knee Tender  To palpation both great toes, right greater than left Skin: Intact, no ulcerations or rash noted.  Psych: Good eye  contact, normal affect. Memory intact not anxious or depressed appearing.  CNS: CN 2-12 intact, power,  normal throughout.no focal deficits noted.   Assessment & Plan  Essential hypertension Controlled, no change in medication DASH diet and commitment to daily physical activity for a minimum of 30 minutes discussed and encouraged, as a part of hypertension management. The importance of attaining a healthy weight is also discussed.  BP/Weight 11/01/2017 09/12/2017 05/02/2017 02/09/2017 01/23/2017 08/17/2016 04/11/8117  Systolic BP 147 829 562 130 865 - 784  Diastolic BP 82 82 82 84 90 - 80  Wt. (Lbs) 238 249 245 - 245 233.6 232.5  BMI 44.97 45.54 46.29 - 46.29 44.14 43.93  Some encounter information is confidential and restricted. Go to Review Flowsheets activity to see all data.       Great toe pain, left acite toe pain s/p  Fall on 10/30/2017, get x ray  Great toe pain, right Acute pain s/p fall on 10/30/2017, needs X ray  Knee pain, left anterior Tender red swelling s/p fall on 10/28, limiting mobility, refer ortho for eval and management  Cystitis CCUA and send for c/s if abnormal  Morbid obesity with BMI of 45.0-49.9, adult ([facility]) Improved. Pt applauded on succesful weight loss through lifestyle change, and encouraged to continue same. Weight loss goal set for the next several months.

## 2017-11-01 NOTE — Assessment & Plan Note (Signed)
Acute pain s/p fall on 10/30/2017, needs X ray

## 2017-11-01 NOTE — Assessment & Plan Note (Signed)
acite toe pain s/p  Fall on 10/30/2017, get x ray

## 2017-11-01 NOTE — Assessment & Plan Note (Signed)
Tender red swelling s/p fall on 10/28, limiting mobility, refer ortho for eval and management

## 2017-11-01 NOTE — Assessment & Plan Note (Signed)
CCUA and send for c/s if abnormal

## 2017-11-01 NOTE — Patient Instructions (Signed)
F/u as before, call if you need me sooner  You are referred urgently to orthopedics re knee, please get X rays of toes at the hospital today  Work excuse from 10/29 to return 11/02/2017  Congrats on weight loss and , blood pressure is excellent Urine is being sent for culture , no antibiotic until that is available

## 2017-11-02 LAB — URINE CULTURE

## 2017-11-05 ENCOUNTER — Encounter: Payer: Self-pay | Admitting: Family Medicine

## 2017-11-28 ENCOUNTER — Encounter: Payer: Self-pay | Admitting: Family Medicine

## 2017-11-28 ENCOUNTER — Other Ambulatory Visit: Payer: Self-pay | Admitting: Family Medicine

## 2017-11-28 MED ORDER — ONDANSETRON HCL 4 MG PO TABS: 4.0000 mg | ORAL_TABLET | Freq: Three times a day (TID) | ORAL | 0 refills | Status: DC | PRN

## 2017-11-28 NOTE — Progress Notes (Signed)
zofran

## 2017-12-15 ENCOUNTER — Telehealth: Payer: Self-pay

## 2017-12-15 ENCOUNTER — Encounter: Payer: Self-pay | Admitting: Family Medicine

## 2017-12-15 NOTE — Telephone Encounter (Signed)
Called and spoke with patient, advised her to go to the ED or at least an Urgent Care to be evaluated for dehydration. Patient understood

## 2018-01-02 ENCOUNTER — Encounter: Payer: Self-pay | Admitting: Family Medicine

## 2018-01-03 ENCOUNTER — Encounter: Payer: Self-pay | Admitting: Family Medicine

## 2018-01-09 ENCOUNTER — Telehealth: Payer: Self-pay

## 2018-01-09 NOTE — Telephone Encounter (Signed)
Work excuse for pt for today only. It was necessary that she provide transportation for her son to appt today

## 2018-01-15 ENCOUNTER — Encounter: Payer: Self-pay | Admitting: Family Medicine

## 2018-01-16 ENCOUNTER — Ambulatory Visit: Payer: BC Managed Care – PPO | Admitting: Family Medicine

## 2018-01-17 ENCOUNTER — Encounter: Payer: Self-pay | Admitting: Family Medicine

## 2018-01-17 ENCOUNTER — Other Ambulatory Visit (HOSPITAL_COMMUNITY): Payer: Self-pay | Admitting: Psychiatry

## 2018-01-22 ENCOUNTER — Ambulatory Visit (INDEPENDENT_AMBULATORY_CARE_PROVIDER_SITE_OTHER): Payer: BC Managed Care – PPO | Admitting: Family Medicine

## 2018-01-22 ENCOUNTER — Encounter: Payer: Self-pay | Admitting: Family Medicine

## 2018-01-22 VITALS — BP 118/70 | HR 91 | Resp 12 | Ht 61.0 in | Wt 216.1 lb

## 2018-01-22 DIAGNOSIS — Z13820 Encounter for screening for osteoporosis: Secondary | ICD-10-CM

## 2018-01-22 DIAGNOSIS — R350 Frequency of micturition: Secondary | ICD-10-CM

## 2018-01-22 DIAGNOSIS — R109 Unspecified abdominal pain: Secondary | ICD-10-CM | POA: Diagnosis not present

## 2018-01-22 DIAGNOSIS — F324 Major depressive disorder, single episode, in partial remission: Secondary | ICD-10-CM | POA: Diagnosis not present

## 2018-01-22 DIAGNOSIS — Z6841 Body Mass Index (BMI) 40.0 and over, adult: Secondary | ICD-10-CM

## 2018-01-22 DIAGNOSIS — E559 Vitamin D deficiency, unspecified: Secondary | ICD-10-CM

## 2018-01-22 DIAGNOSIS — K219 Gastro-esophageal reflux disease without esophagitis: Secondary | ICD-10-CM

## 2018-01-22 DIAGNOSIS — I10 Essential (primary) hypertension: Secondary | ICD-10-CM

## 2018-01-22 DIAGNOSIS — F409 Phobic anxiety disorder, unspecified: Secondary | ICD-10-CM

## 2018-01-22 DIAGNOSIS — F5105 Insomnia due to other mental disorder: Secondary | ICD-10-CM

## 2018-01-22 DIAGNOSIS — E785 Hyperlipidemia, unspecified: Secondary | ICD-10-CM

## 2018-01-22 LAB — POCT URINALYSIS DIP (CLINITEK)
Glucose, UA: NEGATIVE mg/dL
Ketones, POC UA: NEGATIVE mg/dL
Leukocytes, UA: NEGATIVE
Nitrite, UA: NEGATIVE
POC PROTEIN,UA: 30 — AB
Spec Grav, UA: 1.02 (ref 1.010–1.025)
Urobilinogen, UA: 0.2 E.U./dL
pH, UA: 7 (ref 5.0–8.0)

## 2018-01-22 MED ORDER — FLUOXETINE HCL 40 MG PO CAPS: 40.0000 mg | ORAL_CAPSULE | Freq: Every day | ORAL | 1 refills | Status: DC

## 2018-01-22 MED ORDER — ALPRAZOLAM 1 MG PO TABS: 1.0000 mg | ORAL_TABLET | Freq: Every day | ORAL | 1 refills | Status: DC

## 2018-01-22 MED ORDER — CYCLOBENZAPRINE HCL 10 MG PO TABS: 10.0000 mg | ORAL_TABLET | Freq: Every day | ORAL | 1 refills | Status: DC

## 2018-01-22 NOTE — Patient Instructions (Signed)
F/u in 5.5 months, call if you need me before  Urine is being tested will treat only if culture is positive  Please work on regular bowel movements  Reduced dose of flexeril and xanax sent for 6 months  Lipid, cmp and eGFr and Vit D today  You are referred for osteoporosis screen  Congrats on weight loss  Handicap sticker today  It is important that you exercise regularly at least 30 minutes 5 times a week. If you develop chest pain, have severe difficulty breathing, or feel very tired, stop exercising immediately and seek medical attention    Continue all medications as listed

## 2018-01-23 ENCOUNTER — Encounter: Payer: Self-pay | Admitting: Family Medicine

## 2018-01-23 ENCOUNTER — Other Ambulatory Visit: Payer: Self-pay | Admitting: Family Medicine

## 2018-01-23 DIAGNOSIS — R32 Unspecified urinary incontinence: Secondary | ICD-10-CM | POA: Insufficient documentation

## 2018-01-23 DIAGNOSIS — R350 Frequency of micturition: Secondary | ICD-10-CM | POA: Insufficient documentation

## 2018-01-23 LAB — COMPLETE METABOLIC PANEL WITH GFR
AG Ratio: 1.2 (calc) (ref 1.0–2.5)
ALKALINE PHOSPHATASE (APISO): 82 U/L (ref 33–115)
ALT: 12 U/L (ref 6–29)
AST: 17 U/L (ref 10–35)
Albumin: 3.9 g/dL (ref 3.6–5.1)
BUN: 12 mg/dL (ref 7–25)
CO2: 34 mmol/L — ABNORMAL HIGH (ref 20–32)
Calcium: 9.5 mg/dL (ref 8.6–10.2)
Chloride: 98 mmol/L (ref 98–110)
Creat: 0.93 mg/dL (ref 0.50–1.10)
GFR, Est African American: 84 mL/min/{1.73_m2} (ref >=60)
GFR, Est Non African American: 73 mL/min/{1.73_m2} (ref >=60)
Globulin: 3.3 g/dL (calc) (ref 1.9–3.7)
Glucose, Bld: 91 mg/dL (ref 65–99)
Potassium: 3.2 mmol/L — ABNORMAL LOW (ref 3.5–5.3)
Sodium: 140 mmol/L (ref 135–146)
Total Bilirubin: 0.5 mg/dL (ref 0.2–1.2)
Total Protein: 7.2 g/dL (ref 6.1–8.1)

## 2018-01-23 LAB — LIPID PANEL
Cholesterol: 243 mg/dL — ABNORMAL HIGH (ref <200)
HDL: 57 mg/dL (ref >50)
LDL Cholesterol (Calc): 170 mg/dL (calc) — ABNORMAL HIGH
Non-HDL Cholesterol (Calc): 186 mg/dL (calc) — ABNORMAL HIGH (ref <130)
Total CHOL/HDL Ratio: 4.3 (calc) (ref <5.0)
Triglycerides: 68 mg/dL (ref <150)

## 2018-01-23 LAB — VITAMIN D 25 HYDROXY (VIT D DEFICIENCY, FRACTURES): Vit D, 25-Hydroxy: 45 ng/mL (ref 30–100)

## 2018-01-23 MED ORDER — POTASSIUM CHLORIDE ER 10 MEQ PO TBCR: 10.0000 meq | EXTENDED_RELEASE_TABLET | Freq: Three times a day (TID) | ORAL | 5 refills | Status: DC

## 2018-01-23 NOTE — Assessment & Plan Note (Signed)
Improved, excellent response to suxenda rx by gyne , continue same Patient re-educated about  the importance of commitment to a  minimum of 150 minutes of exercise per week.  The importance of healthy food choices with portion control discussed. Encouraged to start a food diary, count calories and to consider  joining a support group. Sample diet sheets offered. Goals set by the patient for the next several months.   Weight /BMI 01/22/2018 11/01/2017 11/01/2017  WEIGHT 216 lb 1.9 oz 237 lb 238 lb  HEIGHT 5\' 1"  5\' 1"  5\' 1"   BMI 40.84 kg/m2 44.78 kg/m2 44.97 kg/m2  Some encounter information is confidential and restricted. Go to Review Flowsheets activity to see all data.

## 2018-01-23 NOTE — Assessment & Plan Note (Signed)
Recurrent cystitis symptoms related to constipation per urology. Symptomatic, CCUA in office normal

## 2018-01-23 NOTE — Assessment & Plan Note (Signed)
Controlled, no change in medication  

## 2018-01-23 NOTE — Assessment & Plan Note (Signed)
Sleep hygiene reviewed and written information offered also. Prescription sent for  medication needed.  

## 2018-01-23 NOTE — Progress Notes (Signed)
QUINCEE GITTENS     MRN: [id number]      DOB: [birth year]   HPI Ms. Petraitis is here for follow up and re-evaluation of chronic medical conditions, medication management and review of any available recent lab and radiology data.  Preventive health is updated, specifically  Cancer screening and Immunization.   Has been to Gyne who started her on suxenda, has done extremely well with weight loss as a result No longer wants to see Psych, well controlled on fluoxetine 40 mg The PT denies any adverse reactions to current medications since the last visit.  Needs to start exercise  ROS Denies recent fever or chills. Denies sinus pressure, nasal congestion, ear pain or sore throat. Denies chest congestion, productive cough or wheezing. Denies chest pains, palpitations and leg swelling Denies abdominal pain, nausea, vomiting,diarrhea or constipation.   Denies dysuria, frequency, hesitancy or incontinence. Denies uncontrolled  joint pain, swelling and limitation in mobility. Denies headaches, seizures, numbness, or tingling. Denies depression, anxiety or insomnia. Denies skin break down or rash.   PE  BP 118/70   Pulse 91   Resp 12   Ht 5\' 1"  (1.549 m)   Wt 216 lb 1.9 oz (98 kg)   SpO2 96% Comment: room air  BMI 40.84 kg/m   Patient alert and oriented and in no cardiopulmonary distress.  HEENT: No facial asymmetry, EOMI,   oropharynx pink and moist.  Neck supple no JVD, no mass.  Chest: Clear to auscultation bilaterally.  CVS: S1, S2 no murmurs, no S3.Regular rate.  ABD: Soft non tender.   Ext: No edema  MS: Adequate though reduced  ROM spine, normal in  shoulders, hips and knees.  Skin: Intact, no ulcerations or rash noted.  Psych: Good eye contact, normal affect. Memory intact not anxious or depressed appearing.  CNS: CN 2-12 intact, power,  normal throughout.no focal deficits noted.   Assessment & Plan  Essential hypertension Controlled, no change in  medication DASH diet and commitment to daily physical activity for a minimum of 30 minutes discussed and encouraged, as a part of hypertension management. The importance of attaining a healthy weight is also discussed.  BP/Weight 01/22/2018 11/01/2017 11/01/2017 09/12/2017 05/02/2017 02/09/2017 5/57/3220  Systolic BP 254 270 623 762 831 517 616  Diastolic BP 70 84 82 82 82 84 90  Wt. (Lbs) 216.12 237 238 249 245 - 245  BMI 40.84 44.78 44.97 45.54 46.29 - 46.29  Some encounter information is confidential and restricted. Go to Review Flowsheets activity to see all data.       Morbid obesity with BMI of 40.0-44.9, adult ([facility]) Improved, excellent response to suxenda rx by gyne , continue same Patient re-educated about  the importance of commitment to a  minimum of 150 minutes of exercise per week.  The importance of healthy food choices with portion control discussed. Encouraged to start a food diary, count calories and to consider  joining a support group. Sample diet sheets offered. Goals set by the patient for the next several months.   Weight /BMI 01/22/2018 11/01/2017 11/01/2017  WEIGHT 216 lb 1.9 oz 237 lb 238 lb  HEIGHT 5\' 1"  5\' 1"  5\' 1"   BMI 40.84 kg/m2 44.78 kg/m2 44.97 kg/m2  Some encounter information is confidential and restricted. Go to Review Flowsheets activity to see all data.      Depression, major, single episode, in partial remission ([facility]) Controlled, no change in medication   GERD Controlled, no change in medication  Insomnia due to anxiety and fear Sleep hygiene reviewed and written information offered also. Prescription sent for  medication needed.   Urine frequency Recurrent cystitis symptoms related to constipation per urology. Symptomatic, CCUA in office normal

## 2018-01-23 NOTE — Assessment & Plan Note (Signed)
Controlled, no change in medication DASH diet and commitment to daily physical activity for a minimum of 30 minutes discussed and encouraged, as a part of hypertension management. The importance of attaining a healthy weight is also discussed.  BP/Weight 01/22/2018 11/01/2017 11/01/2017 09/12/2017 05/02/2017 02/09/2017 1/61/0960  Systolic BP 454 098 119 147 829 562 130  Diastolic BP 70 84 82 82 82 84 90  Wt. (Lbs) 216.12 237 238 249 245 - 245  BMI 40.84 44.78 44.97 45.54 46.29 - 46.29  Some encounter information is confidential and restricted. Go to Review Flowsheets activity to see all data.

## 2018-04-04 ENCOUNTER — Other Ambulatory Visit: Payer: Self-pay | Admitting: Family Medicine

## 2018-05-02 ENCOUNTER — Encounter: Payer: Self-pay | Admitting: Family Medicine

## 2018-05-07 ENCOUNTER — Ambulatory Visit: Payer: BC Managed Care – PPO | Admitting: Family Medicine

## 2018-05-07 ENCOUNTER — Ambulatory Visit (INDEPENDENT_AMBULATORY_CARE_PROVIDER_SITE_OTHER): Payer: BC Managed Care – PPO | Admitting: Family Medicine

## 2018-05-07 ENCOUNTER — Other Ambulatory Visit: Payer: Self-pay

## 2018-05-07 VITALS — BP 111/77 | Ht 61.0 in | Wt 206.0 lb

## 2018-05-07 DIAGNOSIS — M62830 Muscle spasm of back: Secondary | ICD-10-CM | POA: Diagnosis not present

## 2018-05-07 DIAGNOSIS — F419 Anxiety disorder, unspecified: Secondary | ICD-10-CM | POA: Diagnosis not present

## 2018-05-07 DIAGNOSIS — I10 Essential (primary) hypertension: Secondary | ICD-10-CM

## 2018-05-07 DIAGNOSIS — Z6841 Body Mass Index (BMI) 40.0 and over, adult: Secondary | ICD-10-CM

## 2018-05-07 DIAGNOSIS — E785 Hyperlipidemia, unspecified: Secondary | ICD-10-CM

## 2018-05-07 MED ORDER — ALPRAZOLAM 1 MG PO TABS: 1.0000 mg | ORAL_TABLET | Freq: Two times a day (BID) | ORAL | 1 refills | Status: DC | PRN

## 2018-05-07 MED ORDER — TRIAMTERENE-HCTZ 75-50 MG PO TABS: 1.0000 | ORAL_TABLET | Freq: Every day | ORAL | 1 refills | Status: DC

## 2018-05-07 MED ORDER — POTASSIUM CHLORIDE ER 10 MEQ PO TBCR: 10.0000 meq | EXTENDED_RELEASE_TABLET | Freq: Three times a day (TID) | ORAL | 2 refills | Status: DC

## 2018-05-07 MED ORDER — ESOMEPRAZOLE MAGNESIUM 40 MG PO CPDR: DELAYED_RELEASE_CAPSULE | ORAL | 1 refills | Status: DC

## 2018-05-07 MED ORDER — FLUOXETINE HCL 20 MG PO TABS: 20.0000 mg | ORAL_TABLET | Freq: Every day | ORAL | 1 refills | Status: DC

## 2018-05-07 MED ORDER — MELOXICAM 15 MG PO TABS: ORAL_TABLET | ORAL | 2 refills | Status: DC

## 2018-05-07 NOTE — Patient Instructions (Signed)
F/U in 4 months, call if you need me sooner  New for anxiety is fluoxetine 20 mg daily, and continue xanax one at bedtime  CONGRATS on excellent weight loss , keep it up!  It is important that you exercise regularly at least 30 minutes 5 times a week. If you develop chest pain, have severe difficulty breathing, or feel very tired, stop exercising immediately and seek medical attention     We are sending tips for managing home bound status, see how this will help your anxiety  Please get fasting lipid, cmp and EGFR at [facility] lab this week as we discussed the order has been faxed in  Thanks for choosing [facility], we consider it a privelige to serve you.

## 2018-05-13 ENCOUNTER — Encounter: Payer: Self-pay | Admitting: Family Medicine

## 2018-05-13 NOTE — Assessment & Plan Note (Signed)
Controlled, no change in medication DASH diet and commitment to daily physical activity for a minimum of 30 minutes discussed and encouraged, as a part of hypertension management. The importance of attaining a healthy weight is also discussed.  BP/Weight 05/07/2018 01/22/2018 11/01/2017 11/01/2017 09/12/2017 01/13/347 06/03/1641  Systolic BP 539 122 583 462 194 712 527  Diastolic BP 77 70 84 82 82 82 84  Wt. (Lbs) 206 216.12 237 238 249 245 -  BMI 38.92 40.84 44.78 44.97 45.54 46.29 -  Some encounter information is confidential and restricted. Go to Review Flowsheets activity to see all data.

## 2018-05-13 NOTE — Assessment & Plan Note (Signed)
continue daily muscle relaxant as before

## 2018-05-13 NOTE — Progress Notes (Signed)
Virtual Visit via Telephone Note  I connected with Stephanie Cannon on 05/13/18 at  3:00 PM EDT by telephone and verified that I am speaking with the correct person using two identifiers.  Location: Patient: home Provider: office   I discussed the limitations, risks, security and privacy concerns of performing an evaluation and management service by telephone and the availability of in person appointments. I also discussed with the patient that there may be a patient responsible charge related to this service. The patient expressed understanding and agreed to proceed.   History of Present Illness: F/U chronic problems amd medication management I need medication for my nerves ,  Feel very anxious, not dealing with being home bound very well Even though I am losing weigth I still don't " feel good" Denies recent fever or chills. Denies sinus pressure, nasal congestion, ear pain or sore throat. Denies chest congestion, productive cough or wheezing. Denies chest pains, palpitations and leg swelling Denies abdominal pain, nausea, vomiting,diarrhea or constipation.   Denies dysuria, frequency, hesitancy or incontinence. Denies skin break down or rash.       Observations/Objective: BP 111/77   Ht 5\' 1"  (1.549 m)   Wt 206 lb (93.4 kg)   BMI 38.92 kg/m  Good communication with no confusion and intact memory. Flat tone to voice Alert and oriented x 3 No signs of respiratory distress during sppech    Assessment and Plan: Anxiety Start fluoxetine and continue bedtime xanax as before  Behavioral modification for anxiety and stress prevention discussed and encouraged and printed info also sent  Essential hypertension Controlled, no change in medication DASH diet and commitment to daily physical activity for a minimum of 30 minutes discussed and encouraged, as a part of hypertension management. The importance of attaining a healthy weight is also discussed.  BP/Weight 05/07/2018  01/22/2018 11/01/2017 11/01/2017 09/12/2017 0/97/3532 09/11/2424  Systolic BP 834 196 222 979 892 119 417  Diastolic BP 77 70 84 82 82 82 84  Wt. (Lbs) 206 216.12 237 238 249 245 -  BMI 38.92 40.84 44.78 44.97 45.54 46.29 -  Some encounter information is confidential and restricted. Go to Review Flowsheets activity to see all data.       Morbid obesity with BMI of 40.0-44.9, adult ([place]) Improved , she is applauded on this and encouraged to continue gradual weight loss Obesity linked with hypertension and arthritis, she is to continue saxenda  Patient re-educated about  the importance of commitment to a  minimum of 150 minutes of exercise per week as able.  The importance of healthy food choices with portion control discussed, as well as eating regularly and within a 12 hour window most days. The need to choose "clean , green" food 50 to 75% of the time is discussed, as well as to make water the primary drink and set a goal of 64 ounces water daily.  Encouraged to start a food diary,  and to consider  joining a support group. Sample diet sheets offered. Goals set by the patient for the next several months.   Weight /BMI 05/07/2018 01/22/2018 11/01/2017  WEIGHT 206 lb 216 lb 1.9 oz 237 lb  HEIGHT 5\' 1"  5\' 1"  5\' 1"   BMI 38.92 kg/m2 40.84 kg/m2 44.78 kg/m2  Some encounter information is confidential and restricted. Go to Review Flowsheets activity to see all data.      Spasm of muscle of lower back continue daily muscle relaxant as before    Follow Up Instructions:  I discussed the assessment and treatment plan with the patient. The patient was provided an opportunity to ask questions and all were answered. The patient agreed with the plan and demonstrated an understanding of the instructions.   The patient was advised to call back or seek an in-person evaluation if the symptoms worsen or if the condition fails to improve as anticipated.  I provided 22 minutes of non-face-to-face  time during this encounter.   [name], MD

## 2018-05-13 NOTE — Assessment & Plan Note (Signed)
Improved , she is applauded on this and encouraged to continue gradual weight loss Obesity linked with hypertension and arthritis, she is to continue saxenda  Patient re-educated about  the importance of commitment to a  minimum of 150 minutes of exercise per week as able.  The importance of healthy food choices with portion control discussed, as well as eating regularly and within a 12 hour window most days. The need to choose "clean , green" food 50 to 75% of the time is discussed, as well as to make water the primary drink and set a goal of 64 ounces water daily.  Encouraged to start a food diary,  and to consider  joining a support group. Sample diet sheets offered. Goals set by the patient for the next several months.   Weight /BMI 05/07/2018 01/22/2018 11/01/2017  WEIGHT 206 lb 216 lb 1.9 oz 237 lb  HEIGHT 5\' 1"  5\' 1"  5\' 1"   BMI 38.92 kg/m2 40.84 kg/m2 44.78 kg/m2  Some encounter information is confidential and restricted. Go to Review Flowsheets activity to see all data.

## 2018-05-13 NOTE — Assessment & Plan Note (Signed)
Start fluoxetine and continue bedtime xanax as before  Behavioral modification for anxiety and stress prevention discussed and encouraged and printed info also sent

## 2018-05-15 ENCOUNTER — Encounter: Payer: Self-pay | Admitting: Family Medicine

## 2018-05-15 NOTE — Telephone Encounter (Signed)
Reached out to Mr Stephanie Cannon LVM to make an appointment, also reached out to [name] spoke with her regarding finger she is on the [facility]

## 2018-05-18 ENCOUNTER — Encounter: Payer: Self-pay | Admitting: Family Medicine

## 2018-06-14 ENCOUNTER — Encounter: Payer: Self-pay | Admitting: Family Medicine

## 2018-07-10 ENCOUNTER — Other Ambulatory Visit: Payer: Self-pay

## 2018-07-10 ENCOUNTER — Ambulatory Visit (INDEPENDENT_AMBULATORY_CARE_PROVIDER_SITE_OTHER): Payer: BC Managed Care – PPO | Admitting: Family Medicine

## 2018-07-10 ENCOUNTER — Ambulatory Visit: Payer: Self-pay | Admitting: Family Medicine

## 2018-07-10 ENCOUNTER — Other Ambulatory Visit (HOSPITAL_COMMUNITY)
Admission: AD | Admit: 2018-07-10 | Discharge: 2018-07-10 | Disposition: A | Discharge status: Home / Self Care | Payer: BC Managed Care – PPO | Source: Skilled Nursing Facility | Attending: Family Medicine | Admitting: Family Medicine

## 2018-07-10 ENCOUNTER — Encounter: Payer: Self-pay | Admitting: Family Medicine

## 2018-07-10 VITALS — BP 120/70 | HR 76 | Resp 12 | Ht 61.0 in | Wt 215.0 lb

## 2018-07-10 DIAGNOSIS — Z1231 Encounter for screening mammogram for malignant neoplasm of breast: Secondary | ICD-10-CM | POA: Diagnosis not present

## 2018-07-10 DIAGNOSIS — R109 Unspecified abdominal pain: Secondary | ICD-10-CM | POA: Diagnosis not present

## 2018-07-10 DIAGNOSIS — Z6841 Body Mass Index (BMI) 40.0 and over, adult: Secondary | ICD-10-CM

## 2018-07-10 DIAGNOSIS — L700 Acne vulgaris: Secondary | ICD-10-CM

## 2018-07-10 DIAGNOSIS — R350 Frequency of micturition: Secondary | ICD-10-CM | POA: Diagnosis not present

## 2018-07-10 DIAGNOSIS — F419 Anxiety disorder, unspecified: Secondary | ICD-10-CM

## 2018-07-10 DIAGNOSIS — R8290 Unspecified abnormal findings in urine: Secondary | ICD-10-CM

## 2018-07-10 DIAGNOSIS — I10 Essential (primary) hypertension: Secondary | ICD-10-CM

## 2018-07-10 DIAGNOSIS — G8929 Other chronic pain: Secondary | ICD-10-CM | POA: Insufficient documentation

## 2018-07-10 LAB — URINALYSIS, COMPLETE (UACMP) WITH MICROSCOPIC
Bilirubin Urine: NEGATIVE
Glucose, UA: NEGATIVE mg/dL
Hgb urine dipstick: NEGATIVE
Ketones, ur: NEGATIVE mg/dL
Nitrite: NEGATIVE
Protein, ur: NEGATIVE mg/dL
Specific Gravity, Urine: 1.009 (ref 1.005–1.030)
pH: 7 (ref 5.0–8.0)

## 2018-07-10 LAB — POCT URINALYSIS DIP (CLINITEK)
Bilirubin, UA: NEGATIVE
Blood, UA: NEGATIVE
Glucose, UA: NEGATIVE mg/dL
Ketones, POC UA: NEGATIVE mg/dL
Nitrite, UA: POSITIVE — AB
POC PROTEIN,UA: NEGATIVE
Spec Grav, UA: 1.015 (ref 1.010–1.025)
Urobilinogen, UA: 0.2 E.U./dL
pH, UA: 7 (ref 5.0–8.0)

## 2018-07-10 MED ORDER — CLINDAMYCIN PHOS-BENZOYL PEROX 1-5 % EX GEL: Freq: Two times a day (BID) | CUTANEOUS | 0 refills | Status: DC

## 2018-07-10 MED ORDER — FLUOXETINE HCL 20 MG PO TABS: 20.0000 mg | ORAL_TABLET | Freq: Every day | ORAL | 1 refills | Status: DC

## 2018-07-10 MED ORDER — CIPROFLOXACIN HCL 500 MG PO TABS: 500.0000 mg | ORAL_TABLET | Freq: Two times a day (BID) | ORAL | 0 refills | Status: DC

## 2018-07-10 NOTE — Progress Notes (Signed)
   Stephanie Cannon     MRN: [id number]      DOB: 08/19/1970   HPI Stephanie Cannon is here  LBP rated at an 8 x 5 days, non radiaiting , feels different fron nerve pain 2 week h/o left flank pain7, generally 4 days per week, less than 30 minute duration Frequency , pressure , urgency no fever chills , but malodorous Continues to exercise and be diligent with food choice with gradual weight loss C/o flare of facial acne requests medication    ROS Denies recent fever or chills. Denies sinus pressure, nasal congestion, ear pain or sore throat. Denies chest congestion, productive cough or wheezing. Denies chest pains, palpitations and leg swelling Denies abdominal pain, nausea, vomiting,diarrhea or constipation.   . Denies uncontrolled  joint pain, swelling and limitation in mobility. Denies headaches, seizures, numbness, or tingling. Denies uncontrolled depression, anxiety or insomnia.  PE  BP 120/70   Pulse 76   Resp 12   Ht 5\' 1"  (1.549 m)   Wt 215 lb 0.6 oz (97.5 kg)   SpO2 97%   BMI 40.63 kg/m   Patient alert and oriented and in no cardiopulmonary distress.  HEENT: No facial asymmetry, EOMI,   oropharynx pink and moist.  Neck supple no JVD, no mass.  Chest: Clear to auscultation bilaterally.  CVS: S1, S2 no murmurs, no S3.Regular rate.  ABD: Soft left flank tenderness   Ext: No edema  MS: Adequate ROM spine, shoulders, hips and knees.  Skin: Intact, no ulcerations or rash noted.  Psych: Good eye contact, normal affect. Memory intact not anxious or depressed appearing.  CNS: CN 2-12 intact, power,  normal throughout.no focal deficits noted.   Assessment & Plan  Urinary frequency  2 week history, abn UA, treat presumptively and f/u culture  Essential hypertension Controlled, no change in medication DASH diet and commitment to daily physical activity for a minimum of 30 minutes discussed and encouraged, as a part of hypertension management. The importance of  attaining a healthy weight is also discussed.  BP/Weight 07/10/2018 05/07/2018 01/22/2018 11/01/2017 11/01/2017 09/12/2017 0/86/5784  Systolic BP 696 295 284 132 440 102 725  Diastolic BP 70 77 70 84 82 82 82  Wt. (Lbs) 215.04 206 216.12 237 238 249 245  BMI 40.63 38.92 40.84 44.78 44.97 45.54 46.29  Some encounter information is confidential and restricted. Go to Review Flowsheets activity to see all data.       Morbid obesity with BMI of 40.0-44.9, adult [facility])  Patient re-educated about  the importance of commitment to a  minimum of 150 minutes of exercise per week as able.  The importance of healthy food choices with portion control discussed, as well as eating regularly and within a 12 hour window most days. The need to choose "clean , green" food 50 to 75% of the time is discussed, as well as to make water the primary drink and set a goal of 64 ounces water daily.    Weight /BMI 07/10/2018 05/07/2018 01/22/2018  WEIGHT 215 lb 0.6 oz 206 lb 216 lb 1.9 oz  HEIGHT 5\' 1"  5\' 1"  5\' 1"   BMI 40.63 kg/m2 38.92 kg/m2 40.84 kg/m2  Some encounter information is confidential and restricted. Go to Review Flowsheets activity to see all data.      Anxiety Controlled, no change in medication   Superficial inflammatory acne vulgaris benzaclin prescribed

## 2018-07-10 NOTE — Patient Instructions (Addendum)
F/U end October, cancel sooner appointment, call if you need me before  [facility]  Labs today  3 meds are prescribed  [place] of left kidney is ordered

## 2018-07-11 ENCOUNTER — Encounter: Payer: Self-pay | Admitting: Family Medicine

## 2018-07-11 LAB — LIPID PANEL
Cholesterol: 213 mg/dL — ABNORMAL HIGH (ref <200)
HDL: 59 mg/dL (ref >=50)
LDL Cholesterol (Calc): 132 mg/dL (calc) — ABNORMAL HIGH
Non-HDL Cholesterol (Calc): 154 mg/dL (calc) — ABNORMAL HIGH (ref <130)
Total CHOL/HDL Ratio: 3.6 (calc) (ref <5.0)
Triglycerides: 108 mg/dL (ref <150)

## 2018-07-11 LAB — COMPLETE METABOLIC PANEL WITH GFR
AG Ratio: 1.2 (calc) (ref 1.0–2.5)
ALT: 21 U/L (ref 6–29)
AST: 26 U/L (ref 10–35)
Albumin: 3.9 g/dL (ref 3.6–5.1)
Alkaline phosphatase (APISO): 75 U/L (ref 31–125)
BUN: 15 mg/dL (ref 7–25)
CO2: 33 mmol/L — ABNORMAL HIGH (ref 20–32)
Calcium: 9.7 mg/dL (ref 8.6–10.2)
Chloride: 99 mmol/L (ref 98–110)
Creat: 0.97 mg/dL (ref 0.50–1.10)
GFR, Est African American: 79 mL/min/{1.73_m2} (ref >=60)
GFR, Est Non African American: 69 mL/min/{1.73_m2} (ref >=60)
Globulin: 3.2 g/dL (calc) (ref 1.9–3.7)
Glucose, Bld: 77 mg/dL (ref 65–99)
Potassium: 3.7 mmol/L (ref 3.5–5.3)
Sodium: 140 mmol/L (ref 135–146)
Total Bilirubin: 0.4 mg/dL (ref 0.2–1.2)
Total Protein: 7.1 g/dL (ref 6.1–8.1)

## 2018-07-12 LAB — URINE CULTURE: Culture: >=100000 — AB

## 2018-07-14 ENCOUNTER — Encounter: Payer: Self-pay | Admitting: Family Medicine

## 2018-07-14 DIAGNOSIS — L700 Acne vulgaris: Secondary | ICD-10-CM | POA: Insufficient documentation

## 2018-07-14 NOTE — Assessment & Plan Note (Signed)
3 to 5 week h/o left flank pain, has longstanding hypertension , no known kidney stones , [place] ordered

## 2018-07-14 NOTE — Assessment & Plan Note (Signed)
benzaclin prescribed

## 2018-07-14 NOTE — Assessment & Plan Note (Signed)
Controlled, no change in medication DASH diet and commitment to daily physical activity for a minimum of 30 minutes discussed and encouraged, as a part of hypertension management. The importance of attaining a healthy weight is also discussed.  BP/Weight 07/10/2018 05/07/2018 01/22/2018 11/01/2017 11/01/2017 09/12/2017 5/92/9244  Systolic BP 628 638 177 116 579 038 333  Diastolic BP 70 77 70 84 82 82 82  Wt. (Lbs) 215.04 206 216.12 237 238 249 245  BMI 40.63 38.92 40.84 44.78 44.97 45.54 46.29  Some encounter information is confidential and restricted. Go to Review Flowsheets activity to see all data.

## 2018-07-14 NOTE — Assessment & Plan Note (Signed)
Controlled, no change in medication  

## 2018-07-14 NOTE — Assessment & Plan Note (Signed)
2 week history, abn UA, treat presumptively and f/u culture

## 2018-07-14 NOTE — Assessment & Plan Note (Signed)
  Patient re-educated about  the importance of commitment to a  minimum of 150 minutes of exercise per week as able.  The importance of healthy food choices with portion control discussed, as well as eating regularly and within a 12 hour window most days. The need to choose "clean , green" food 50 to 75% of the time is discussed, as well as to make water the primary drink and set a goal of 64 ounces water daily.    Weight /BMI 07/10/2018 05/07/2018 01/22/2018  WEIGHT 215 lb 0.6 oz 206 lb 216 lb 1.9 oz  HEIGHT 5\' 1"  5\' 1"  5\' 1"   BMI 40.63 kg/m2 38.92 kg/m2 40.84 kg/m2  Some encounter information is confidential and restricted. Go to Review Flowsheets activity to see all data.

## 2018-07-27 ENCOUNTER — Other Ambulatory Visit: Payer: Self-pay

## 2018-07-27 ENCOUNTER — Ambulatory Visit (HOSPITAL_COMMUNITY): Payer: BC Managed Care – PPO

## 2018-07-27 DIAGNOSIS — Z20822 Contact with and (suspected) exposure to covid-19: Secondary | ICD-10-CM

## 2018-07-29 LAB — NOVEL CORONAVIRUS, NAA: SARS-CoV-2, NAA: NOT DETECTED

## 2018-07-30 ENCOUNTER — Encounter: Payer: Self-pay | Admitting: Family Medicine

## 2018-07-30 ENCOUNTER — Telehealth: Payer: Self-pay | Admitting: Family Medicine

## 2018-07-30 NOTE — Telephone Encounter (Signed)
FMLA  Copied Sleeved Noted  

## 2018-08-02 ENCOUNTER — Encounter: Payer: Self-pay | Admitting: Family Medicine

## 2018-08-02 NOTE — Telephone Encounter (Signed)
Patient is checking on the status of FMLA

## 2018-08-03 ENCOUNTER — Ambulatory Visit (HOSPITAL_COMMUNITY): Payer: BC Managed Care – PPO

## 2018-08-06 ENCOUNTER — Ambulatory Visit (HOSPITAL_COMMUNITY): Payer: BC Managed Care – PPO | Attending: Family Medicine

## 2018-08-06 ENCOUNTER — Telehealth: Payer: Self-pay | Admitting: Family Medicine

## 2018-08-06 NOTE — Telephone Encounter (Signed)
Received the corrected forms, printed and copied as of 08-06-18 and placed with Dr

## 2018-08-06 NOTE — Telephone Encounter (Signed)
FMLA  Copied Sleeved Noted  

## 2018-08-17 DIAGNOSIS — I10 Essential (primary) hypertension: Secondary | ICD-10-CM

## 2018-08-18 ENCOUNTER — Other Ambulatory Visit: Payer: Self-pay

## 2018-08-18 DIAGNOSIS — Z20822 Contact with and (suspected) exposure to covid-19: Secondary | ICD-10-CM

## 2018-08-19 LAB — NOVEL CORONAVIRUS, NAA: SARS-CoV-2, NAA: NOT DETECTED

## 2018-08-22 ENCOUNTER — Encounter: Payer: Self-pay | Admitting: Family Medicine

## 2018-08-27 ENCOUNTER — Other Ambulatory Visit: Payer: Self-pay | Admitting: Family Medicine

## 2018-09-06 ENCOUNTER — Ambulatory Visit: Payer: BC Managed Care – PPO | Admitting: Family Medicine

## 2018-09-11 ENCOUNTER — Ambulatory Visit: Payer: BC Managed Care – PPO | Admitting: Family Medicine

## 2018-09-19 ENCOUNTER — Ambulatory Visit: Payer: BC Managed Care – PPO

## 2018-09-19 ENCOUNTER — Encounter: Payer: Self-pay | Admitting: Family Medicine

## 2018-09-19 ENCOUNTER — Other Ambulatory Visit: Payer: Self-pay

## 2018-09-19 ENCOUNTER — Ambulatory Visit (INDEPENDENT_AMBULATORY_CARE_PROVIDER_SITE_OTHER): Payer: BC Managed Care – PPO | Admitting: Family Medicine

## 2018-09-19 VITALS — BP 120/70 | Ht 61.0 in | Wt 207.0 lb

## 2018-09-19 DIAGNOSIS — F409 Phobic anxiety disorder, unspecified: Secondary | ICD-10-CM

## 2018-09-19 DIAGNOSIS — Z6841 Body Mass Index (BMI) 40.0 and over, adult: Secondary | ICD-10-CM

## 2018-09-19 DIAGNOSIS — F5105 Insomnia due to other mental disorder: Secondary | ICD-10-CM | POA: Diagnosis not present

## 2018-09-19 DIAGNOSIS — I10 Essential (primary) hypertension: Secondary | ICD-10-CM

## 2018-09-19 DIAGNOSIS — G4709 Other insomnia: Secondary | ICD-10-CM | POA: Diagnosis not present

## 2018-09-19 DIAGNOSIS — G4700 Insomnia, unspecified: Secondary | ICD-10-CM | POA: Insufficient documentation

## 2018-09-19 MED ORDER — TEMAZEPAM 15 MG PO CAPS: 15.0000 mg | ORAL_CAPSULE | Freq: Every evening | ORAL | 2 refills | Status: DC | PRN

## 2018-09-19 NOTE — Assessment & Plan Note (Signed)
  Patient re-educated about  the importance of commitment to a  minimum of 150 minutes of exercise per week as able.  The importance of healthy food choices with portion control discussed, as well as eating regularly and within a 12 hour window most days. The need to choose "clean , green" food 50 to 75% of the time is discussed, as well as to make water the primary drink and set a goal of 64 ounces water daily.    Weight /BMI 09/19/2018 07/10/2018 05/07/2018  WEIGHT 207 lb 215 lb 0.6 oz 206 lb  HEIGHT 5\' 1"  5\' 1"  5\' 1"   BMI 39.11 kg/m2 40.63 kg/m2 38.92 kg/m2  Some encounter information is confidential and restricted. Go to Review Flowsheets activity to see all data.

## 2018-09-19 NOTE — Patient Instructions (Signed)
F/U as before, call if you need me sooner  New additional medication for sleep, restoril one at bedtime has been prescribed  You alsop NEED to practice good / healthy sleep habits as discussed, and a brochure on this is enclosed also  Congrats on weight loss , keep it up!  It is important that you exercise regularly at least 30 minutes7times a week. If you develop chest pain, have severe difficulty breathing, or feel very tired, stop exercising immediately and seek medical attention   Thanks for choosing [facility], we consider it a privelige to serve you.

## 2018-09-19 NOTE — Progress Notes (Signed)
Virtual Visit via Telephone Note  I connected with Stephanie Cannon on 09/19/18 at 10:20 AM EDT by telephone and verified that I am speaking with the correct person using two identifiers.  Location: Patient: work Provider: office   I discussed the limitations, risks, security and privacy concerns of performing an evaluation and management service by telephone and the availability of in person appointments. I also discussed with the patient that there may be a patient responsible charge related to this service. The patient expressed understanding and agreed to proceed. I     History of Present Illness: Poor sleep, at most 3 hours now while on the job, has relied on xanax  But not sufficient. Increased difficulty  with concentration  And focus. Approximately 6 months , denies uncontrolled depression or uncontrolled anxiety. Does sleep with the tV as background noise Denies recent fever or chills. Denies sinus pressure, nasal congestion, ear pain or sore throat. Denies chest congestion, productive cough or wheezing. Denies chest pains, palpitations and leg swelling Denies abdominal pain, nausea, vomiting,diarrhea or constipation.   Denies dysuria, frequency, hesitancy or incontinence. Denies uncontrolled  joint pain, swelling and limitation in mobility. Denies skin break down or rash.     Observations/Objective: BP 120/70   Ht 5\' 1"  (1.549 m)   Wt 207 lb (93.9 kg)   BMI 39.11 kg/m   Good communication with no confusion and intact memory. Alert and oriented x 3 No signs of respiratory distress during speech     Assessment and Plan: Morbid obesity with BMI of 40.0-44.9, adult [facility])  Patient re-educated about  the importance of commitment to a  minimum of 150 minutes of exercise per week as able.  The importance of healthy food choices with portion control discussed, as well as eating regularly and within a 12 hour window most days. The need to choose "clean , green" food 50  to 75% of the time is discussed, as well as to make water the primary drink and set a goal of 64 ounces water daily.    Weight /BMI 09/19/2018 07/10/2018 05/07/2018  WEIGHT 207 lb 215 lb 0.6 oz 206 lb  HEIGHT 5\' 1"  5\' 1"  5\' 1"   BMI 39.11 kg/m2 40.63 kg/m2 38.92 kg/m2  Some encounter information is confidential and restricted. Go to Review Flowsheets activity to see all data.      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 The importance of attaining a healthy weight is also discussed.  BP/Weight 09/19/2018 07/10/2018 05/07/2018 01/22/2018 11/01/2017 11/01/2017 0000000  Systolic BP 123456 123456 [id number] 123456 [id number]  Diastolic BP 70 70 77 70 84 82 82  Wt. (Lbs) 207 215.04 206 216.12 237 238 249  BMI 39.11 40.63 38.92 40.84 44.78 44.97 45.54  Some encounter information is confidential and restricted. Go to Review Flowsheets activity to see all data.         Follow Up Instructions:    I discussed the assessment and treatment plan with the patient. The patient was provided an opportunity to ask questions and all were answered. The patient agreed with the plan and demonstrated an understanding of the instructions.   The patient was advised to call back or seek  an in-person evaluation if the symptoms worsen or if the condition fails to improve as anticipated.  I provided 21 minutes of non-face-to-face time during this encounter.   [name], MD

## 2018-09-23 ENCOUNTER — Encounter: Payer: Self-pay | Admitting: Family Medicine

## 2018-09-23 NOTE — Assessment & Plan Note (Signed)
Continue xanax as before

## 2018-09-23 NOTE — Assessment & Plan Note (Signed)
Uncontrolled with xanax only, not staying asleep Sleep hygiene is reviewed Add restoril at bedtime

## 2018-09-23 NOTE — Assessment & Plan Note (Signed)
Controlled, no change in medication DASH diet and commitment to daily physical activity for a minimum of 30 minutes discussed and encouraged, as a part of hypertension management. The importance of attaining a healthy weight is also discussed.  BP/Weight 09/19/2018 07/10/2018 05/07/2018 01/22/2018 11/01/2017 11/01/2017 0000000  Systolic BP 123456 123456 [id number] 123456 [id number]  Diastolic BP 70 70 77 70 84 82 82  Wt. (Lbs) 207 215.04 206 216.12 237 238 249  BMI 39.11 40.63 38.92 40.84 44.78 44.97 45.54  Some encounter information is confidential and restricted. Go to Review Flowsheets activity to see all data.

## 2018-09-24 ENCOUNTER — Telehealth: Payer: Self-pay | Admitting: Family Medicine

## 2018-09-24 NOTE — Telephone Encounter (Signed)
Flu shot has been documented in chart.

## 2018-09-24 NOTE — Telephone Encounter (Signed)
Talked  To pt she confirmed that she had the flu shot at work on 09-19-18.

## 2018-09-27 ENCOUNTER — Ambulatory Visit: Payer: BC Managed Care – PPO

## 2018-09-27 ENCOUNTER — Ambulatory Visit
Admission: RE | Admit: 2018-09-27 | Discharge: 2018-09-27 | Disposition: A | Discharge status: Home / Self Care | Payer: BC Managed Care – PPO | Source: Ambulatory Visit | Attending: Family Medicine | Admitting: Family Medicine

## 2018-09-27 ENCOUNTER — Other Ambulatory Visit: Payer: Self-pay

## 2018-09-27 DIAGNOSIS — Z1231 Encounter for screening mammogram for malignant neoplasm of breast: Secondary | ICD-10-CM

## 2018-10-02 ENCOUNTER — Other Ambulatory Visit: Payer: Self-pay

## 2018-10-02 ENCOUNTER — Telehealth: Payer: Self-pay | Admitting: *Deleted

## 2018-10-02 ENCOUNTER — Encounter: Payer: Self-pay | Admitting: Family Medicine

## 2018-10-02 ENCOUNTER — Ambulatory Visit (INDEPENDENT_AMBULATORY_CARE_PROVIDER_SITE_OTHER): Payer: BC Managed Care – PPO | Admitting: Family Medicine

## 2018-10-02 VITALS — BP 120/70 | Ht 61.0 in | Wt 207.0 lb

## 2018-10-02 DIAGNOSIS — I10 Essential (primary) hypertension: Secondary | ICD-10-CM | POA: Diagnosis not present

## 2018-10-02 DIAGNOSIS — K529 Noninfective gastroenteritis and colitis, unspecified: Secondary | ICD-10-CM | POA: Diagnosis not present

## 2018-10-02 MED ORDER — DIPHENOXYLATE-ATROPINE 2.5-0.025 MG PO TABS: 1.0000 | ORAL_TABLET | Freq: Four times a day (QID) | ORAL | 0 refills | Status: DC | PRN

## 2018-10-02 MED ORDER — ONDANSETRON HCL 8 MG PO TABS: 8.0000 mg | ORAL_TABLET | Freq: Three times a day (TID) | ORAL | 0 refills | Status: DC | PRN

## 2018-10-02 NOTE — Progress Notes (Signed)
Virtual Visit via Telephone Note  I connected with Stephanie Cannon on 10/02/18 at  3:20 PM EDT by telephone and verified that I am speaking with the correct person using two identifiers.  Location: Patient: home Provider: office   I discussed the limitations, risks, security and privacy concerns of performing an evaluation and management service by telephone and the availability of in person appointments. I also discussed with the patient that there may be a patient responsible charge related to this service. The patient expressed understanding and agreed to proceed.   History of Present Illness: 2 day h/o vomit approx 3 times since yesterday, just had a , has chills  3 watery stool since yesterday, brown, no mucus or blood in stool. has had sip of water, no food yesterday, feels weak Denies recent fever or chills. Denies sinus pressure, nasal congestion, ear pain or sore throat. Denies chest congestion, productive cough or wheezing. .   mild urinary frequency Denies uncontrolled  joint pain, swelling and limitation in mobility.      Observations/Objective: BP 120/70   Ht 5\' 1"  (1.549 m)   Wt 207 lb (93.9 kg)   BMI 39.11 kg/m  From record Good communication with no confusion and intact memory. Alert and oriented x 3 No signs of respiratory distress during speech, soumds ill and weak    Assessment and Plan: Gastroenteritis Acute onset causing significant weakness and debility. Check la for dehydration educated re die and need to adequately hydrate Brat diet , zofran and lomotil prescribed. Work excuse  Essential hypertension Controlled, no change in medication     Follow Up Instructions:    I discussed the assessment and treatment plan with the patient. The patient was provided an opportunity to ask questions and all were answered. The patient agreed with the plan and demonstrated an understanding of the instructions.   The patient was advised to call back or  seek an in-person evaluation if the symptoms worsen or if the condition fails to improve as anticipated.  I provided 12 minutes of non-face-to-face time during this encounter.   [name], MD

## 2018-10-02 NOTE — Patient Instructions (Addendum)
F/U as before, call if you need me sooner  You are treated for acute gastroenteritis.  Please make a BIG effort to ensure adequate liquid intake so you do not become dehydrated.  Zofran for nausea, and lomotil; for loose stools are prescribed  If you feel weak , light headed, or are unable to tolerate liquid or solid by mouth, you ned to go to the ED  Work excuse from 9/29 to return 10/04/2018   Food Choices to Help Relieve Diarrhea, Adult When you have diarrhea, the foods you eat and your eating habits are very important. Choosing the right foods and drinks can help:  Relieve diarrhea.  Replace lost fluids and nutrients.  Prevent dehydration. What general guidelines should I follow?  Relieving diarrhea  Choose foods with less than 2 g or .07 oz. of fiber per serving.  Limit fats to less than 8 tsp (38 g or 1.34 oz.) a day.  Avoid the following: ? Foods and beverages sweetened with high-fructose corn syrup, honey, or sugar alcohols such as xylitol, sorbitol, and mannitol. ? Foods that contain a lot of fat or sugar. ? Fried, greasy, or spicy foods. ? High-fiber grains, breads, and cereals. ? Raw fruits and vegetables.  Eat foods that are rich in probiotics. These foods include dairy products such as yogurt and fermented milk products. They help increase healthy bacteria in the stomach and intestines (gastrointestinal tract, or GI tract).  If you have lactose intolerance, avoid dairy products. These may make your diarrhea worse.  Take medicine to help stop diarrhea (antidiarrheal medicine) only as told by your health care provider. Replacing nutrients  Eat small meals or snacks every 3-4 hours.  Eat bland foods, such as white rice, toast, or baked potato, until your diarrhea starts to get better. Gradually reintroduce nutrient-rich foods as tolerated or as told by your health care provider. This includes: ? Well-cooked protein foods. ? Peeled, seeded, and soft-cooked  fruits and vegetables. ? Low-fat dairy products.  Take vitamin and mineral supplements as told by your health care provider. Preventing dehydration  Start by sipping water or a special solution to prevent dehydration (oral rehydration solution, ORS). Urine that is clear or pale yellow means that you are getting enough fluid.  Try to drink at least 8-10 cups of fluid each day to help replace lost fluids.  You may add other liquids in addition to water, such as clear juice or decaffeinated sports drinks, as tolerated or as told by your health care provider.  Avoid drinks with caffeine, such as coffee, tea, or soft drinks.  Avoid alcohol. What foods are recommended?     The items listed may not be a complete list. Talk with your health care provider about what dietary choices are best for you. Grains White rice. White, [place], or pita breads (fresh or toasted), including plain rolls, buns, or bagels. White pasta. Saltine, soda, or graham crackers. Pretzels. Low-fiber cereal. Cooked cereals made with water (such as cornmeal, farina, or cream cereals). Plain muffins. Matzo. Melba toast. Zwieback. Vegetables Potatoes (without the skin). Most well-cooked and canned vegetables without skins or seeds. Tender lettuce. Fruits Apple sauce. Fruits canned in juice. Cooked apricots, cherries, grapefruit, peaches, pears, or plums. Fresh bananas and cantaloupe. Meats and other protein foods Baked or boiled chicken. Eggs. Tofu. Fish. Seafood. Smooth nut butters. Ground or well-cooked tender beef, ham, veal, lamb, pork, or poultry. Dairy Plain yogurt, kefir, and unsweetened liquid yogurt. Lactose-free milk, buttermilk, skim milk, or soy milk. Low-fat  or nonfat hard cheese. Beverages Water. Low-calorie sports drinks. Fruit juices without pulp. Strained tomato and vegetable juices. Decaffeinated teas. Sugar-free beverages not sweetened with sugar alcohols. Oral rehydration solutions, if approved by your  health care provider. Seasoning and other foods Bouillon, broth, or soups made from recommended foods. What foods are not recommended? The items listed may not be a complete list. Talk with your health care provider about what dietary choices are best for you. Grains Whole grain, whole wheat, bran, or rye breads, rolls, pastas, and crackers. Wild or brown rice. Whole grain or bran cereals. Barley. Oats and oatmeal. Corn tortillas or taco shells. Granola. Popcorn. Vegetables Raw vegetables. Fried vegetables. Cabbage, broccoli, Brussels sprouts, artichokes, baked beans, beet greens, corn, kale, legumes, peas, sweet potatoes, and yams. Potato skins. Cooked spinach and cabbage. Fruits Dried fruit, including raisins and dates. Raw fruits. Stewed or dried prunes. Canned fruits with syrup. Meat and other protein foods Fried or fatty meats. Deli meats. Chunky nut butters. Nuts and seeds. Beans and lentils. Berniece Salines. Hot dogs. Sausage. Dairy High-fat cheeses. Whole milk, chocolate milk, and beverages made with milk, such as milk shakes. Half-and-half. Cream. sour cream. Ice cream. Beverages Caffeinated beverages (such as coffee, tea, soda, or energy drinks). Alcoholic beverages. Fruit juices with pulp. Prune juice. Soft drinks sweetened with high-fructose corn syrup or sugar alcohols. High-calorie sports drinks. Fats and oils Butter. Cream sauces. Margarine. Salad oils. Plain salad dressings. Olives. Avocados. Mayonnaise. Sweets and desserts Sweet rolls, doughnuts, and sweet breads. Sugar-free desserts sweetened with sugar alcohols such as xylitol and sorbitol. Seasoning and other foods Honey. Hot sauce. Chili powder. Gravy. Cream-based or milk-based soups. Pancakes and waffles. Summary  When you have diarrhea, the foods you eat and your eating habits are very important.  Make sure you get at least 8-10 cups of fluid each day, or enough to keep your urine clear or pale yellow.  Eat bland foods and  gradually reintroduce healthy, nutrient-rich foods as tolerated, or as told by your health care provider.  Avoid high-fiber, fried, greasy, or spicy foods. This information is not intended to replace advice given to you by your health care provider. Make sure you discuss any questions you have with your health care provider. Document Released: 03/12/2003 Document Revised: 04/12/2018 Document Reviewed: 12/18/2015 Elsevier Patient Education  2020 [employer].

## 2018-10-02 NOTE — Telephone Encounter (Signed)
Pt has a phone visit at 3:20 nausea vomiting headaches and chills would like to be checked for a uti also. Wanted to know if Dr Moshe Cipro would like to order a urine she could have someone come by and pick up cup

## 2018-10-03 ENCOUNTER — Encounter: Payer: Self-pay | Admitting: Family Medicine

## 2018-10-03 NOTE — Telephone Encounter (Signed)
Patient thought that the nausea/vomitting was from am uti and had just wanted her urine checked but before the phone visit she started having more sinus/resp symptoms

## 2018-10-03 NOTE — Assessment & Plan Note (Signed)
Controlled, no change in medication  

## 2018-10-03 NOTE — Telephone Encounter (Signed)
noted 

## 2018-10-03 NOTE — Telephone Encounter (Signed)
Just seeing concern re UTI and this was not  Verbalized at visit, pls contact pt if still symptomatic for UTI, just order lab done [facility] and C/S pls

## 2018-10-03 NOTE — Assessment & Plan Note (Signed)
Acute onset causing significant weakness and debility. Check la for dehydration educated re die and need to adequately hydrate Brat diet , zofran and lomotil prescribed. Work excuse

## 2018-10-04 ENCOUNTER — Encounter: Payer: Self-pay | Admitting: Family Medicine

## 2018-10-04 ENCOUNTER — Telehealth: Payer: Self-pay

## 2018-10-04 DIAGNOSIS — E8881 Metabolic syndrome: Secondary | ICD-10-CM

## 2018-10-04 DIAGNOSIS — I10 Essential (primary) hypertension: Secondary | ICD-10-CM

## 2018-10-04 DIAGNOSIS — E785 Hyperlipidemia, unspecified: Secondary | ICD-10-CM

## 2018-10-04 NOTE — Telephone Encounter (Signed)
-----  Message from Margaret E Simpson, MD sent at 10/04/2018  1:08 PM EDT ----- Regarding: pls send lab order to [facility] for pt to have cbc and diff, cmp and EGFR today, thanks, not stat   

## 2018-10-05 ENCOUNTER — Encounter: Payer: Self-pay | Admitting: Family Medicine

## 2018-10-05 ENCOUNTER — Other Ambulatory Visit: Payer: Self-pay

## 2018-10-05 ENCOUNTER — Other Ambulatory Visit (HOSPITAL_COMMUNITY)
Admission: RE | Admit: 2018-10-05 | Discharge: 2018-10-05 | Disposition: A | Discharge status: Home / Self Care | Payer: BC Managed Care – PPO | Source: Other Acute Inpatient Hospital | Attending: Family Medicine | Admitting: Family Medicine

## 2018-10-05 ENCOUNTER — Ambulatory Visit (INDEPENDENT_AMBULATORY_CARE_PROVIDER_SITE_OTHER): Payer: BC Managed Care – PPO | Admitting: Family Medicine

## 2018-10-05 VITALS — BP 108/68 | HR 75 | Temp 98.1°F | Resp 15 | Ht 62.0 in | Wt 219.0 lb

## 2018-10-05 DIAGNOSIS — K529 Noninfective gastroenteritis and colitis, unspecified: Secondary | ICD-10-CM

## 2018-10-05 DIAGNOSIS — Z20822 Contact with and (suspected) exposure to covid-19: Secondary | ICD-10-CM

## 2018-10-05 DIAGNOSIS — H6122 Impacted cerumen, left ear: Secondary | ICD-10-CM

## 2018-10-05 DIAGNOSIS — R350 Frequency of micturition: Secondary | ICD-10-CM | POA: Insufficient documentation

## 2018-10-05 DIAGNOSIS — Z6841 Body Mass Index (BMI) 40.0 and over, adult: Secondary | ICD-10-CM

## 2018-10-05 LAB — POCT URINALYSIS DIP (CLINITEK)
Bilirubin, UA: NEGATIVE
Glucose, UA: NEGATIVE mg/dL
Ketones, POC UA: NEGATIVE mg/dL
Leukocytes, UA: NEGATIVE
Nitrite, UA: NEGATIVE
POC PROTEIN,UA: NEGATIVE
Spec Grav, UA: 1.025 (ref 1.010–1.025)
Urobilinogen, UA: 0.2 E.U./dL
pH, UA: 6 (ref 5.0–8.0)

## 2018-10-05 LAB — CBC WITH DIFFERENTIAL/PLATELET
Basophils Absolute: 0 10*3/uL (ref 0.0–0.2)
Basos: 1 %
EOS (ABSOLUTE): 0.1 10*3/uL (ref 0.0–0.4)
Eos: 1 %
Hematocrit: 41.8 % (ref 34.0–46.6)
Hemoglobin: 14.1 g/dL (ref 11.1–15.9)
Immature Grans (Abs): 0 10*3/uL (ref 0.0–0.1)
Immature Granulocytes: 0 %
Lymphocytes Absolute: 2.1 10*3/uL (ref 0.7–3.1)
Lymphs: 29 %
MCH: 30.3 pg (ref 26.6–33.0)
MCHC: 33.7 g/dL (ref 31.5–35.7)
MCV: 90 fL (ref 79–97)
Monocytes Absolute: 0.5 10*3/uL (ref 0.1–0.9)
Monocytes: 7 %
Neutrophils Absolute: 4.4 10*3/uL (ref 1.4–7.0)
Neutrophils: 62 %
Platelets: 288 10*3/uL (ref 150–450)
RBC: 4.65 x10E6/uL (ref 3.77–5.28)
RDW: 13 % (ref 11.7–15.4)
WBC: 7.1 10*3/uL (ref 3.4–10.8)

## 2018-10-05 LAB — CMP14+EGFR
ALT: 14 IU/L (ref 0–32)
AST: 21 IU/L (ref 0–40)
Albumin/Globulin Ratio: 1.2 (ref 1.2–2.2)
Albumin: 3.8 g/dL (ref 3.8–4.8)
Alkaline Phosphatase: 91 IU/L (ref 39–117)
BUN/Creatinine Ratio: 19 (ref 9–23)
BUN: 17 mg/dL (ref 6–24)
Bilirubin Total: 0.3 mg/dL (ref 0.0–1.2)
CO2: 29 mmol/L (ref 20–29)
Calcium: 9.3 mg/dL (ref 8.7–10.2)
Chloride: 105 mmol/L (ref 96–106)
Creatinine, Ser: 0.89 mg/dL (ref 0.57–1.00)
GFR calc Af Amer: 88 mL/min/{1.73_m2} (ref >59)
GFR calc non Af Amer: 76 mL/min/{1.73_m2} (ref >59)
Globulin, Total: 3.2 g/dL (ref 1.5–4.5)
Glucose: 78 mg/dL (ref 65–99)
Potassium: 3.6 mmol/L (ref 3.5–5.2)
Sodium: 145 mmol/L — ABNORMAL HIGH (ref 134–144)
Total Protein: 7 g/dL (ref 6.0–8.5)

## 2018-10-05 NOTE — Progress Notes (Signed)
Subjective:     Patient ID: Stephanie Cannon, female   DOB: 11/16/1970, 48 y.o.   MRN: [id number]  Stephanie Cannon presents for gastritis (acute per Dr.Simpson vomiting and diarrhea have stopped. feels better today and she has been) and Urinary Frequency (thinks she has a UTI. low back pain also)  Stephanie Cannon is a 48 year old female patient who was recently seen via telephone by Dr. Moshe Cipro earlier this week for possible gastritis.  She was sent in Zofran and Lomotil she reports that she is not had any more diarrhea, did have 1 loose stool last night on 10 1.  Is not taking Lomotil anymore.  Reports that she has not vomited since September 28.  She has had some nausea continuously off and on.  But reports the Zofran is helping that.  She reports that she has some low back pain, no burning, increased frequency in urination.  Unsure if she has a urinary tract infection.  Additionally she has had carotid testing a month and a half back but that was negative but she has a headache, nausea vomiting, feverish, sinus pressure nose pressure forehead pressure a little bit of a cough from nasal drainage unsure if it is allergy related.  And a little bit year tenderness on the left.  She is reported wearing her mask and not sure she is been exposed to COVID but overall she has an increase in symptoms over the last week.  Additionally she had her labs drawn and she had no elevation in WBCs, kidney and liver were good.  No differentiation seen for infection.  Additionally she reports that she has put on a good amount of weight since her last visit.  She reports that she is probably been eating more not as active as she used to be.  Today patient denies some of the signs and symptoms of COVID 19 infection including fever, chills, cough, shortness of breath.  Past Medical, Surgical, Social History, Allergies, and Medications have been Reviewed.  Past Medical History:  Diagnosis Date  . Arthritis   .  Breast discharge    left spont white discharge x 6 months  . Carpal tunnel syndrome    left, wears brace at night  . Chronic back pain   . GERD (gastroesophageal reflux disease)   . Hyperlipidemia   . Hypertension   . Obesity    Past Surgical History:  Procedure Laterality Date  . ABDOMINAL HYSTERECTOMY  2003   partial  . BREAST BIOPSY Left 2018   benign  . CARPAL TUNNEL RELEASE Right   . CESAREAN SECTION  1988  . CHOLECYSTECTOMY    . COLONOSCOPY N/A 09/21/2012   Procedure: COLONOSCOPY;  Surgeon: Danie Binder, MD;  Location: [facility];  Service: Endoscopy;  Laterality: N/A;  9:30  . LAPAROSCOPIC GASTRIC SLEEVE RESECTION WITH HIATAL HERNIA REPAIR N/A 06/23/2014   Procedure: LAPAROSCOPIC LYSIS OF ADHESIONS, GASTRIC SLEEVE RESECTION AND UPPER ENDO;  Surgeon: Alphonsa Overall, MD;  Location: [facility];  Service: General;  Laterality: N/A;  . LAPAROSCOPIC LYSIS OF ADHESIONS  06/23/2014   Procedure: LAPAROSCOPIC LYSIS OF ADHESIONS;  Surgeon: Alphonsa Overall, MD;  Location: [facility];  Service: General;;  . LUMBAR FUSION  01/22/2014   L4 L5       DR Arnoldo Morale  . other     Vocal chord polyp removal  . 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 TONSILLECTOMY    . UPPER GI ENDOSCOPY  06/23/2014   Procedure: UPPER GI ENDOSCOPY;  Surgeon: Alphonsa Overall, MD;  Location: [facility];  Service: General;;   Social History   Socioeconomic History  . Marital status: Married    Spouse name: Not on file  . Number of children: Not on file  . Years of education: Not on file  . Highest education level: Not on file  Occupational History  . Occupation: Soil scientist: [employer]   Social Needs  . Financial resource strain: Not on file  . Food insecurity    Worry: Not on file    Inability: Not on file  . Transportation needs    Medical: Not on file    Non-medical: Not on file  Tobacco Use  . Smoking status: Former Smoker    Types: Cigarettes  . Smokeless tobacco:  Never Used  . Tobacco comment: quit smoking 2011  Substance and Sexual Activity  . Alcohol use: Yes    Comment: occasionally  . Drug use: No  . Sexual activity: Not on file  Lifestyle  . Physical activity    Days per week: Not on file    Minutes per session: Not on file  . Stress: Not on file  Relationships  . Social Herbalist on phone: Not on file    Gets together: Not on file    Attends religious service: Not on file    Active member of club or organization: Not on file    Attends meetings of clubs or organizations: Not on file    Relationship status: Not on file  . Intimate partner violence    Fear of current or ex partner: Not on file    Emotionally abused: Not on file    Physically abused: Not on file    Forced sexual activity: Not on file  Other Topics Concern  . Not on file  Social History Narrative  . Not on file    Outpatient Encounter Medications as of 10/05/2018  Medication Sig  . ALPRAZolam (XANAX) 1 MG tablet Take 1 tablet (1 mg total) by mouth 2 (two) times daily as needed for anxiety.  . clindamycin-benzoyl peroxide (BENZACLIN) gel Apply topically 2 (two) times daily.  . cyclobenzaprine (FLEXERIL) 10 MG tablet TAKE 1 TABLET BY MOUTH AT BEDTIME.  . diphenoxylate-atropine (LOMOTIL) 2.5-0.025 MG tablet Take 1 tablet by mouth 4 (four) times daily as needed for diarrhea or loose stools.  Marland Kitchen esomeprazole (NEXIUM) 40 MG capsule TAKE (1) CAPSULE BY MOUTH ONCE DAILY.  Marland Kitchen FLUoxetine (PROZAC) 20 MG tablet Take 1 tablet (20 mg total) by mouth daily.  . meloxicam (MOBIC) 15 MG tablet Take one tablet once daily as needed, for uncontrolled back pain  . Multiple Vitamins-Minerals (MULTIVITAMIN GUMMIES ADULT PO) Take 1 each by mouth 2 (two) times daily.  . ondansetron (ZOFRAN) 8 MG tablet Take 1 tablet (8 mg total) by mouth every 8 (eight) hours as needed for nausea or vomiting.  . potassium chloride (K-DUR) 10 MEQ tablet Take 1 tablet (10 mEq total) by mouth 3 (three)  times daily.  Marland Kitchen SAXENDA 18 MG/3ML SOPN Inject 3 mg into the skin daily.   . temazepam (RESTORIL) 15 MG capsule Take 1 capsule (15 mg total) by mouth at bedtime as needed for sleep.  Marland Kitchen triamterene-hydrochlorothiazide (MAXZIDE) 75-50 MG tablet Take 1 tablet by mouth daily.   No facility-administered encounter medications on file as of 10/05/2018.    Allergies  Allergen  Reactions  . Codeine Itching  . Morphine Itching    Review of Systems  HENT: Negative.   Eyes: Negative.   Respiratory: Negative.   Cardiovascular: Negative.   Gastrointestinal: Positive for nausea. Negative for diarrhea and vomiting.  Endocrine: Negative.   Genitourinary: Positive for dyspareunia, dysuria, flank pain, frequency and urgency.  Musculoskeletal: Positive for back pain.  Skin: Negative.   Allergic/Immunologic: Negative.   Neurological: Negative.   Hematological: Negative.   Psychiatric/Behavioral: Negative.   All other systems reviewed and are negative.      Objective:     BP 108/68   Pulse 75   Temp 98.1 F (36.7 C) (Oral)   Resp 15   Ht 5\' 2"  (1.575 m)   Wt 219 lb 0.6 oz (99.4 kg)   SpO2 99%   BMI 40.06 kg/m   Physical Exam Vitals signs and nursing note reviewed.  Constitutional:      Appearance: Normal appearance. She is well-developed and well-groomed. She is obese.  HENT:     Head: Normocephalic and atraumatic.     Right Ear: Tympanic membrane, ear canal and external ear normal.     Left Ear: External ear normal. There is impacted cerumen.     Nose: Congestion and rhinorrhea present.     Right Turbinates: Enlarged.     Left Turbinates: Enlarged.     Right Sinus: Frontal sinus tenderness present. No maxillary sinus tenderness.     Left Sinus: Frontal sinus tenderness present. No maxillary sinus tenderness.     Mouth/Throat:     Mouth: Mucous membranes are moist.     Pharynx: Oropharynx is clear.  Eyes:     General: Lids are normal.        Right eye: No discharge.        Left  eye: No discharge.     Conjunctiva/sclera: Conjunctivae normal.  Neck:     Musculoskeletal: Normal range of motion and neck supple.     Thyroid: No thyromegaly.  Cardiovascular:     Rate and Rhythm: Normal rate and regular rhythm.     Pulses: Normal pulses.     Heart sounds: Normal heart sounds.  Pulmonary:     Effort: Pulmonary effort is normal.     Breath sounds: Normal breath sounds.  Abdominal:     General: Bowel sounds are normal.     Tenderness: There is no right CVA tenderness or left CVA tenderness.  Musculoskeletal: Normal range of motion.  Lymphadenopathy:     Cervical: No cervical adenopathy.  Skin:    General: Skin is warm.  Neurological:     General: No focal deficit present.     Mental Status: She is alert and oriented to person, place, and time.  Psychiatric:        Attention and Perception: Attention and perception normal.        Mood and Affect: Mood and affect normal.        Speech: Speech normal.        Behavior: Behavior normal. Behavior is cooperative.        Thought Content: Thought content normal.        Cognition and Memory: Cognition and memory normal.        Judgment: Judgment normal.   Ears flushed, left ear would not come clean TM not visualized.       Assessment and Plan       1. Morbid obesity with BMI of 40.0-44.9, adult ([place]) Deteriorated  Autumn Patty  Menzies is re-educated about the importance of exercise daily to help with weight management. A minumum of 30 minutes daily is recommended. Additionally, importance of healthy food choices  with portion control discussed.  Wt Readings from Last 3 Encounters:  10/05/18 219 lb 0.6 oz (99.4 kg)  10/02/18 207 lb (93.9 kg)  09/19/18 207 lb (93.9 kg)   2. Urine frequency UA was negative in the office.  We will be sending out for culture to make sure if she did have some blood in there unsure if it is just from oxidation versus some type of infection given her symptoms.  Will await culture  before we prescribe any medication.  - POCT URINALYSIS DIP (CLINITEK) - Urine Culture  3. Gastroenteritis Question as to whether or not this is related to COVID versus some other viral infection.  She is being tested for COVID today.  Pending findings will address any medication needs.  Otherwise instructed her to continue the medication as a Dr. Moshe Cipro prescribed to her if needed and to stay hydrated and eat fresh fruits and vegetables as she can.  4. Impacted cerumen of left ear Ears flushed patient tolerated well.  Left ear still did not come clean.  Tried to use curette to remove the wax.  Unable to do so.  Advised on how she can keep her ears clean outside of here.  By using Debrox.  Verbalized understanding.    Follow Up: 10/29/2018  Perlie Mayo, DNP, AGNP-BC [address], [place] [address] Office Hours: Mon-Thurs 8 am-5 pm; Fri 8 am-12 pm Office Phone:  [phone]  Office Fax: [phone]

## 2018-10-05 NOTE — Patient Instructions (Signed)
    I appreciate the opportunity to provide you with the care for your health and wellness. Today we discussed: you not feeling well---HOPE YOU FEEL BETTER  Follow up: 10/29/2018  No labs or referrals today  Please go get COVID testing. Pending results we will decide if a antibiotic is needed. I will send this in to your pharmacy of choice once we know.  Please hydrate well, get fresh veggies and fruits, and rest. Work note today to cover 9/29-10/2. Return to work on 10/08/2018 pending COVID testing results.  Please continue to practice social distancing to keep you, your family, and our community safe.  If you must go out, please wear a mask and practice good handwashing.  GET FRESH AIR DAILY. STAY HYDRATED WITH WATER.   It was a pleasure to see you and I look forward to continuing to work together on your health and well-being. Please do not hesitate to call the office if you need care or have questions about your care.  Have a wonderful day and week. With Gratitude, Cherly Beach, DNP, AGNP-BC

## 2018-10-06 LAB — NOVEL CORONAVIRUS, NAA: SARS-CoV-2, NAA: NOT DETECTED

## 2018-10-06 LAB — URINE CULTURE: Culture: 10000 — AB

## 2018-10-07 NOTE — Progress Notes (Signed)
Not a good clean catch.Advise that most likely she is negative for an infection. Continue hydration and if symptoms of UTI occur to call office.

## 2018-10-29 ENCOUNTER — Other Ambulatory Visit: Payer: Self-pay

## 2018-10-29 ENCOUNTER — Other Ambulatory Visit: Payer: Self-pay | Admitting: Family Medicine

## 2018-10-29 ENCOUNTER — Ambulatory Visit (INDEPENDENT_AMBULATORY_CARE_PROVIDER_SITE_OTHER): Payer: BC Managed Care – PPO | Admitting: Family Medicine

## 2018-10-29 ENCOUNTER — Encounter: Payer: Self-pay | Admitting: Family Medicine

## 2018-10-29 VITALS — BP 124/74 | HR 78 | Temp 97.6°F | Resp 15 | Ht 61.0 in | Wt 209.1 lb

## 2018-10-29 DIAGNOSIS — K219 Gastro-esophageal reflux disease without esophagitis: Secondary | ICD-10-CM

## 2018-10-29 DIAGNOSIS — I10 Essential (primary) hypertension: Secondary | ICD-10-CM | POA: Diagnosis not present

## 2018-10-29 DIAGNOSIS — E559 Vitamin D deficiency, unspecified: Secondary | ICD-10-CM

## 2018-10-29 DIAGNOSIS — G4709 Other insomnia: Secondary | ICD-10-CM | POA: Diagnosis not present

## 2018-10-29 DIAGNOSIS — Z6841 Body Mass Index (BMI) 40.0 and over, adult: Secondary | ICD-10-CM

## 2018-10-29 DIAGNOSIS — M62830 Muscle spasm of back: Secondary | ICD-10-CM

## 2018-10-29 DIAGNOSIS — E785 Hyperlipidemia, unspecified: Secondary | ICD-10-CM

## 2018-10-29 DIAGNOSIS — F419 Anxiety disorder, unspecified: Secondary | ICD-10-CM

## 2018-10-29 MED ORDER — ALPRAZOLAM 1 MG PO TABS: 1.0000 mg | ORAL_TABLET | Freq: Every day | ORAL | 1 refills | Status: AC

## 2018-10-29 MED ORDER — HYDROXYZINE HCL 25 MG PO TABS: ORAL_TABLET | ORAL | 1 refills | Status: DC

## 2018-10-29 MED ORDER — FLUOXETINE HCL 20 MG PO TABS: 20.0000 mg | ORAL_TABLET | Freq: Every day | ORAL | 1 refills | Status: DC

## 2018-10-29 MED ORDER — POTASSIUM CHLORIDE ER 10 MEQ PO TBCR: 10.0000 meq | EXTENDED_RELEASE_TABLET | Freq: Every day | ORAL | 3 refills | Status: DC

## 2018-10-29 MED ORDER — TEMAZEPAM 15 MG PO CAPS: 15.0000 mg | ORAL_CAPSULE | Freq: Every evening | ORAL | 1 refills | Status: DC | PRN

## 2018-10-29 NOTE — Patient Instructions (Addendum)
F/U in 5. Month, call if you need me before  Fasting TSH, vit D and chem7 and EGFr, 1 week before next visit   Please continue medications as before   increase exercise to 5 days/week  Think about what you will eat, plan ahead. Choose " clean, green, fresh or frozen" over canned, processed or packaged foods which are more sugary, salty and fatty. 70 to 75% of food eaten should be vegetables and fruit. Three meals at set times with snacks allowed between meals, but they must be fruit or vegetables. Aim to eat over a 12 hour period , example 7 am to 7 pm, and STOP after  your last meal of the day. Drink water,generally about 64 ounces per day, no other drink is as healthy. Fruit juice is best enjoyed in a healthy way, by EATING the fruit.   Limit food environment at home

## 2018-10-29 NOTE — Progress Notes (Signed)
Stephanie Cannon     MRN: [id number]      DOB: [birth year]   HPI Stephanie Cannon is here for follow up and re-evaluation of chronic medical conditions, medication management and review of any available recent lab and radiology data.  Preventive health is updated, specifically  Cancer screening and Immunization.   Questions or concerns regarding consultations or procedures which the PT has had in the interim are  addressed. The PT denies any adverse reactions to current medications since the last visit.  There are no new concerns.  There are no specific complaints   ROS Denies recent fever or chills. Denies sinus pressure, nasal congestion, ear pain or sore throat. Denies chest congestion, productive cough or wheezing. Denies chest pains, palpitations and leg swelling Denies abdominal pain, nausea, vomiting,diarrhea or constipation.   Denies dysuria, frequency, hesitancy or incontinence. Denies joint pain, swelling and limitation in mobility. Denies headaches, seizures, numbness, or tingling. Denies uncontrolled  depression, anxiety or insomnia. Denies skin break down or rash.   PE  BP 124/74   Pulse 78   Temp 97.6 F (36.4 C) (Temporal)   Resp 15   Ht 5\' 1"  (1.549 m)   Wt 209 lb 1.9 oz (94.9 kg)   SpO2 98%   BMI 39.51 kg/m   Patient alert and oriented and in no cardiopulmonary distress.  HEENT: No facial asymmetry, EOMI,     Neck supple .  Chest: Clear to auscultation bilaterally.  CVS: S1, S2 no murmurs, no S3.Regular rate.  ABD: Soft non tender.   Ext: No edema  MS: Adequate ROM spine, shoulders, hips and knees.  Skin: Intact, no ulcerations or rash noted.  Psych: Good eye contact, normal affect. Memory intact not anxious or depressed appearing.  CNS: CN 2-12 intact, power,  normal throughout.no focal deficits noted.   Assessment & Plan  Essential hypertension Controlled, no change in medication DASH diet and commitment to daily physical activity for a  minimum of 30 minutes discussed and encouraged, as a part of hypertension management. The importance of attaining a healthy weight is also discussed.  BP/Weight 10/29/2018 10/05/2018 10/02/2018 09/19/2018 07/10/2018 05/07/2018 [id number]  Systolic BP [id number] [id number] 123456 123456 [id number]  Diastolic BP 74 68 70 70 70 77 70  Wt. (Lbs) 209.12 219.04 207 207 215.04 206 216.12  BMI 39.51 40.06 39.11 39.11 40.63 38.92 40.84  Some encounter information is confidential and restricted. Go to Review Flowsheets activity to see all data.       Morbid obesity with BMI of 40.0-44.9, adult ([place]) Obesity linked with hypertension and depression  Patient re-educated about  the importance of commitment to a  minimum of 150 minutes of exercise per week as able.  The importance of healthy food choices with portion control discussed, as well as eating regularly and within a 12 hour window most days. The need to choose "clean , green" food 50 to 75% of the time is discussed, as well as to make water the primary drink and set a goal of 64 ounces water daily.    Weight /BMI 10/29/2018 10/05/2018 10/02/2018  WEIGHT 209 lb 1.9 oz 219 lb 0.6 oz 207 lb  HEIGHT 5\' 1"  5\' 2"  5\' 1"   BMI 39.51 kg/m2 40.06 kg/m2 39.11 kg/m2  Some encounter information is confidential and restricted. Go to Review Flowsheets activity to see all data.      Insomnia Sleep hygiene reviewed and written information offered also. Prescription sent for  medication needed.  Anxiety Controlled, no change in medication   GERD Controlled, no change in medication   Spasm of muscle of lower back Controlled, no change in medication

## 2018-10-29 NOTE — Assessment & Plan Note (Signed)
Sleep hygiene reviewed and written information offered also. Prescription sent for  medication needed.  

## 2018-10-29 NOTE — Assessment & Plan Note (Signed)
Controlled, no change in medication DASH diet and commitment to daily physical activity for a minimum of 30 minutes discussed and encouraged, as a part of hypertension management. The importance of attaining a healthy weight is also discussed.  BP/Weight 10/29/2018 10/05/2018 10/02/2018 09/19/2018 07/10/2018 05/07/2018 [id number]  Systolic BP [id number] 123456 123456 123456 [id number] [id number]  Diastolic BP 74 68 70 70 70 77 70  Wt. (Lbs) 209.12 219.04 207 207 215.04 206 216.12  BMI 39.51 40.06 39.11 39.11 40.63 38.92 40.84  Some encounter information is confidential and restricted. Go to Review Flowsheets activity to see all data.

## 2018-10-29 NOTE — Assessment & Plan Note (Signed)
Obesity linked with hypertension and depression  Patient re-educated about  the importance of commitment to a  minimum of 150 minutes of exercise per week as able.  The importance of healthy food choices with portion control discussed, as well as eating regularly and within a 12 hour window most days. The need to choose "clean , green" food 50 to 75% of the time is discussed, as well as to make water the primary drink and set a goal of 64 ounces water daily.    Weight /BMI 10/29/2018 10/05/2018 10/02/2018  WEIGHT 209 lb 1.9 oz 219 lb 0.6 oz 207 lb  HEIGHT 5\' 1"  5\' 2"  5\' 1"   BMI 39.51 kg/m2 40.06 kg/m2 39.11 kg/m2  Some encounter information is confidential and restricted. Go to Review Flowsheets activity to see all data.

## 2018-10-29 NOTE — Assessment & Plan Note (Signed)
Controlled, no change in medication  

## 2018-11-19 ENCOUNTER — Other Ambulatory Visit: Payer: Self-pay | Admitting: Family Medicine

## 2018-11-20 ENCOUNTER — Encounter: Payer: Self-pay | Admitting: Family Medicine

## 2018-11-20 ENCOUNTER — Other Ambulatory Visit: Payer: Self-pay | Admitting: Family Medicine

## 2018-11-20 MED ORDER — IBUPROFEN 800 MG PO TABS: 800.0000 mg | ORAL_TABLET | Freq: Three times a day (TID) | ORAL | 0 refills | Status: DC | PRN

## 2018-12-10 ENCOUNTER — Other Ambulatory Visit: Payer: Self-pay

## 2018-12-10 ENCOUNTER — Ambulatory Visit
Admission: EM | Admit: 2018-12-10 | Discharge: 2018-12-10 | Disposition: A | Discharge status: Home / Self Care | Payer: BC Managed Care – PPO | Attending: Emergency Medicine | Admitting: Emergency Medicine

## 2018-12-10 DIAGNOSIS — J069 Acute upper respiratory infection, unspecified: Secondary | ICD-10-CM | POA: Diagnosis not present

## 2018-12-10 DIAGNOSIS — B9789 Other viral agents as the cause of diseases classified elsewhere: Secondary | ICD-10-CM

## 2018-12-10 DIAGNOSIS — Z20828 Contact with and (suspected) exposure to other viral communicable diseases: Secondary | ICD-10-CM

## 2018-12-10 DIAGNOSIS — R05 Cough: Secondary | ICD-10-CM | POA: Diagnosis not present

## 2018-12-10 DIAGNOSIS — R062 Wheezing: Secondary | ICD-10-CM | POA: Diagnosis not present

## 2018-12-10 DIAGNOSIS — Z20822 Contact with and (suspected) exposure to covid-19: Secondary | ICD-10-CM

## 2018-12-10 DIAGNOSIS — R5383 Other fatigue: Secondary | ICD-10-CM

## 2018-12-10 LAB — POC SARS CORONAVIRUS 2 AG -  ED: SARS Coronavirus 2 Ag: NEGATIVE

## 2018-12-10 MED ORDER — BENZONATATE 100 MG PO CAPS: 100.0000 mg | ORAL_CAPSULE | Freq: Three times a day (TID) | ORAL | 0 refills | Status: DC

## 2018-12-10 NOTE — Discharge Instructions (Signed)
Rapid COVID negative.  Culture sent.  Patient should remain in quarantine until they have received culture results.  If negative you may resume normal activities (go back to work/school) while practicing hand hygiene, social distance, and mask wearing.  If positive, patient should remain in quarantine for 10 days from symptom onset AND greater than 72 hours after symptoms resolution (absence of fever without the use of fever-reducing medication and improvement in respiratory symptoms), whichever is longer Get plenty of rest and push fluids Tessalon sent for cough Use OTC zyrtec for nasal congestion, runny nose, and/or sore throat Use OTC flonase for nasal congestion and runny nose Use medications daily for symptom relief Use OTC medications like ibuprofen or tylenol as needed fever or pain Call or go to the ED if you have any new or worsening symptoms such as fever, worsening cough, shortness of breath, chest tightness, chest pain, turning blue, changes in mental status, etc..Marland Kitchen

## 2018-12-10 NOTE — ED Triage Notes (Signed)
Pt presents to UC stating she was exposed to a positive covid person 11 days ago. Pt states she had a covid test 6 days ago which was negative. Pt states she has had a cough and some chills x7 days. Pt states she needs a negative test result to go back to work.

## 2018-12-10 NOTE — ED Provider Notes (Signed)
[facility]   [id number] 12/10/18 Arrival Time: 1003   CC: COVID symptoms;COVID test  SUBJECTIVE: History from: patient.  Stephanie Cannon is a 48 y.o. female who presents non productive cough, wheezing, fatigue,  and chills x 1 week.  Positive exposure 11 days ago.  Has tried OTC medications with relief.  Denies aggravating factors.  Has COVID test 6 days ago that was negative.  Denies fever, chills, sinus pain, rhinorrhea, sore throat, SOB, chest pain, nausea, changes in bowel or bladder habits.     ROS: As per HPI.  All other pertinent ROS negative.     Past Medical History:  Diagnosis Date  . Arthritis   . Breast discharge    left spont white discharge x 6 months  . Carpal tunnel syndrome    left, wears brace at night  . Chronic back pain   . GERD (gastroesophageal reflux disease)   . Hyperlipidemia   . Hypertension   . Obesity    Past Surgical History:  Procedure Laterality Date  . ABDOMINAL HYSTERECTOMY  2003   partial  . BREAST BIOPSY Left 2018   benign  . CARPAL TUNNEL RELEASE Right   . CESAREAN SECTION  1988  . CHOLECYSTECTOMY    . COLONOSCOPY N/A 09/21/2012   Procedure: COLONOSCOPY;  Surgeon: Danie Binder, MD;  Location: [facility];  Service: Endoscopy;  Laterality: N/A;  9:30  . LAPAROSCOPIC GASTRIC SLEEVE RESECTION WITH HIATAL HERNIA REPAIR N/A 06/23/2014   Procedure: LAPAROSCOPIC LYSIS OF ADHESIONS, GASTRIC SLEEVE RESECTION AND UPPER ENDO;  Surgeon: Alphonsa Overall, MD;  Location: [facility];  Service: General;  Laterality: N/A;  . LAPAROSCOPIC LYSIS OF ADHESIONS  06/23/2014   Procedure: LAPAROSCOPIC LYSIS OF ADHESIONS;  Surgeon: Alphonsa Overall, MD;  Location: [facility];  Service: General;;  . LUMBAR FUSION  01/22/2014   L4 L5       DR Arnoldo Morale  . other     Vocal chord polyp removal  . 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 TONSILLECTOMY    . UPPER GI ENDOSCOPY  06/23/2014   Procedure: UPPER GI ENDOSCOPY;  Surgeon: Alphonsa Overall, MD;  Location: [facility]  [facility];  Service: General;;   Allergies  Allergen Reactions  . Codeine Itching  . Morphine Itching   No current facility-administered medications on file prior to encounter.    Current Outpatient Medications on File Prior to Encounter  Medication Sig Dispense Refill  . ALPRAZolam (XANAX) 1 MG tablet Take 1 tablet (1 mg total) by mouth at bedtime. 90 tablet 1  . clindamycin-benzoyl peroxide (BENZACLIN) gel Apply topically 2 (two) times daily. 50 g 0  . cyclobenzaprine (FLEXERIL) 10 MG tablet TAKE 1 TABLET BY MOUTH AT BEDTIME. 90 tablet 0  . esomeprazole (NEXIUM) 40 MG capsule TAKE (1) CAPSULE BY MOUTH ONCE DAILY. 90 capsule 0  . FLUoxetine (PROZAC) 20 MG tablet Take 1 tablet (20 mg total) by mouth daily. 90 tablet 1  . hydrOXYzine (ATARAX/VISTARIL) 25 MG tablet Take one tablet at bedtime for itching and sleep 90 tablet 1  . ibuprofen (ADVIL) 800 MG tablet Take 1 tablet (800 mg total) by mouth every 8 (eight) hours as needed. 30 tablet 0  . meloxicam (MOBIC) 15 MG tablet Take one tablet once daily as needed, for uncontrolled back pain 30 tablet 2  . Multiple Vitamins-Minerals (MULTIVITAMIN GUMMIES ADULT PO) Take 1 each by mouth 2 (two) times daily.    . potassium chloride (KLOR-CON 10) 10 MEQ  tablet Take 1 tablet (10 mEq total) by mouth daily. 90 tablet 3  . SAXENDA 18 MG/3ML SOPN Inject 3 mg into the skin daily.     . temazepam (RESTORIL) 15 MG capsule Take 1 capsule (15 mg total) by mouth at bedtime as needed for sleep. 90 capsule 1  . triamterene-hydrochlorothiazide (MAXZIDE) 75-50 MG tablet TAKE 1 TABLET BY MOUTH ONCE DAILY. 90 tablet 0   Social History   Socioeconomic History  . Marital status: Married    Spouse name: Not on file  . Number of children: Not on file  . Years of education: Not on file  . Highest education level: Not on file  Occupational History  . Occupation: Soil scientist: [employer]   Social Needs  . Financial resource  strain: Not on file  . Food insecurity    Worry: Not on file    Inability: Not on file  . Transportation needs    Medical: Not on file    Non-medical: Not on file  Tobacco Use  . Smoking status: Former Smoker    Types: Cigarettes  . Smokeless tobacco: Never Used  . Tobacco comment: quit smoking 2011  Substance and Sexual Activity  . Alcohol use: Yes    Comment: occasionally  . Drug use: No  . Sexual activity: Not on file  Lifestyle  . Physical activity    Days per week: Not on file    Minutes per session: Not on file  . Stress: Not on file  Relationships  . Social Herbalist on phone: Not on file    Gets together: Not on file    Attends religious service: Not on file    Active member of club or organization: Not on file    Attends meetings of clubs or organizations: Not on file    Relationship status: Not on file  . Intimate partner violence    Fear of current or ex partner: Not on file    Emotionally abused: Not on file    Physically abused: Not on file    Forced sexual activity: Not on file  Other Topics Concern  . Not on file  Social History Narrative  . Not on file   Family History  Problem Relation Age of Onset  . Hypertension Mother   . Hypertension Father   . Hypertension Brother   . Cancer Maternal Aunt   . Cancer Maternal Uncle        liver  . Cancer Paternal Uncle        bone cancer  . Diabetes Maternal Grandmother   . Colon cancer Neg Hx     OBJECTIVE:  Vitals:   12/10/18 1018  BP: 111/71  Pulse: 79  Temp: 97.7 F (36.5 C)  TempSrc: Oral  SpO2: 99%     General appearance: alert; well-appearing, nontoxic; speaking in full sentences and tolerating own secretions HEENT: NCAT; Ears: EACs clear, TMs pearly gray; Eyes: PERRL.  EOM grossly intact. Nose: nares patent without rhinorrhea, Throat: oropharynx clear, tonsils non erythematous or enlarged, uvula midline  Neck: supple without LAD Lungs: unlabored respirations, symmetrical air  entry; cough: absent; no respiratory distress; CTAB Heart: regular rate and rhythm.  Skin: warm and dry Psychological: alert and cooperative; normal mood and affect  LABS:  Results for orders placed or performed during the hospital encounter of 12/10/18 (from the past 24 hour(s))  POC SARS Coronavirus 2 Ag-ED - Nasal  Swab (BD Veritor Kit)     Status: None   Collection Time: 12/10/18 10:44 AM  Result Value Ref Range   SARS Coronavirus 2 Ag Negative Negative     ASSESSMENT & PLAN:  1. Suspected COVID-19 virus infection   2. Viral URI with cough   3. Exposure to COVID-19 virus     Meds ordered this encounter  Medications  . benzonatate (TESSALON) 100 MG capsule    Sig: Take 1 capsule (100 mg total) by mouth every 8 (eight) hours.    Dispense:  21 capsule    Refill:  0    Order Specific Question:   Supervising Provider    Answer:   Raylene Everts [[id number]]   Rapid COVID negative.  Culture sent.  Patient should remain in quarantine until they have received culture results.  If negative you may resume normal activities (go back to work/school) while practicing hand hygiene, social distance, and mask wearing.  If positive, patient should remain in quarantine for 10 days from symptom onset AND greater than 72 hours after symptoms resolution (absence of fever without the use of fever-reducing medication and improvement in respiratory symptoms), whichever is longer Get plenty of rest and push fluids Tessalon sent for cough Use OTC zyrtec for nasal congestion, runny nose, and/or sore throat Use OTC flonase for nasal congestion and runny nose Use medications daily for symptom relief Use OTC medications like ibuprofen or tylenol as needed fever or pain Call or go to the ED if you have any new or worsening symptoms such as fever, worsening cough, shortness of breath, chest tightness, chest pain, turning blue, changes in mental status, etc...    Reviewed expectations re: course of  current medical issues. Questions answered. Outlined signs and symptoms indicating need for more acute intervention. Patient verbalized understanding. After Visit Summary given.         Lestine Box, PA-C 12/10/18 1057

## 2018-12-11 LAB — NOVEL CORONAVIRUS, NAA: SARS-CoV-2, NAA: NOT DETECTED

## 2018-12-14 ENCOUNTER — Encounter: Payer: Self-pay | Admitting: Family Medicine

## 2018-12-19 ENCOUNTER — Encounter: Payer: Self-pay | Admitting: Family Medicine

## 2018-12-19 NOTE — Telephone Encounter (Signed)
Pt LVM stating that she wanted her father to go to Dr Posey Pronto in [place]

## 2018-12-31 ENCOUNTER — Other Ambulatory Visit (HOSPITAL_COMMUNITY): Payer: BC Managed Care – PPO

## 2019-01-01 ENCOUNTER — Encounter: Payer: Self-pay | Admitting: Family Medicine

## 2019-01-02 ENCOUNTER — Other Ambulatory Visit: Payer: Self-pay | Admitting: Family Medicine

## 2019-01-09 ENCOUNTER — Ambulatory Visit (INDEPENDENT_AMBULATORY_CARE_PROVIDER_SITE_OTHER): Payer: BC Managed Care – PPO | Admitting: Family Medicine

## 2019-01-09 ENCOUNTER — Other Ambulatory Visit: Payer: Self-pay

## 2019-01-09 ENCOUNTER — Encounter: Payer: Self-pay | Admitting: Family Medicine

## 2019-01-09 VITALS — BP 111/71 | Ht 61.0 in | Wt 201.0 lb

## 2019-01-09 DIAGNOSIS — Z79899 Other long term (current) drug therapy: Secondary | ICD-10-CM | POA: Diagnosis not present

## 2019-01-09 DIAGNOSIS — R195 Other fecal abnormalities: Secondary | ICD-10-CM | POA: Diagnosis not present

## 2019-01-09 DIAGNOSIS — I10 Essential (primary) hypertension: Secondary | ICD-10-CM

## 2019-01-09 DIAGNOSIS — R110 Nausea: Secondary | ICD-10-CM

## 2019-01-09 MED ORDER — DIPHENOXYLATE-ATROPINE 2.5-0.025 MG PO TABS: 1.0000 | ORAL_TABLET | Freq: Four times a day (QID) | ORAL | 0 refills | Status: DC | PRN

## 2019-01-09 NOTE — Progress Notes (Signed)
Virtual Visit via Telephone Note  I connected with Stephanie Cannon on 01/09/19 at  3:00 PM EST by telephone and verified that I am speaking with the correct person using two identifiers.  Location: Patient: home  Provider: office   I discussed the limitations, risks, security and privacy concerns of performing an evaluation and management service by telephone and the availability of in person appointments. I also discussed with the patient that there may be a patient responsible charge related to this service. The patient expressed understanding and agreed to proceed.   History of Present Illness: Decreased appetite with malaise 3 days ago. After bM this am loose , watery, had to get to the floor  she felt so weak. Positive covid exposure with multiple negative tests Denies recent fever or chills. Denies sinus pressure, nasal congestion, ear pain or sore throat. Denies chest congestion, productive cough or wheezing. Denies chest pains, palpitations and leg swelling  Denies dysuria, frequency, hesitancy or incontinence c/o increased back pain and spasm Denies headaches, seizures, numbness, or tingling. Denies depression, anxiety or insomnia. Denies skin break down or rash.     Observations/Objective: BP 111/71   Ht 5\' 1"  (1.549 m)   Wt 201 lb (91.2 kg)   BMI 37.98 kg/m  Good communication with no confusion and intact memory. Alert and oriented x 3 No signs of respiratory distress during speech    Assessment and Plan: Watery stools Acute onset of watery stool associated with nausea. Lomtil prescribed for as needed use, encouraged to push fluids, follow BRAT and practice good hygiene. Work excuse x 1 day  Nausea Zofran already in patient's possession for as needed, use  Essential hypertension Controlled, no change in medication DASH diet and commitment to daily physical activity for a minimum of 30 minutes discussed and encouraged, as a part of hypertension  management. The importance of attaining a healthy weight is also discussed.  BP/Weight 01/09/2019 12/10/2018 10/29/2018 10/05/2018 10/02/2018 [id number]  Systolic BP [id number] 123456 123456 123456  Diastolic BP 71 71 74 68 70 70 70  Wt. (Lbs) 201 - 209.12 219.04 207 207 215.04  BMI 37.98 - 39.51 40.06 39.11 39.11 40.63  Some encounter information is confidential and restricted. Go to Review Flowsheets activity to see all data.         Follow Up Instructions:    I discussed the assessment and treatment plan with the patient. The patient was provided an opportunity to ask questions and all were answered. The patient agreed with the plan and demonstrated an understanding of the instructions.   The patient was advised to call back or seek an in-person evaluation if the symptoms worsen or if the condition fails to improve as anticipated.  I provided 15  minutes of non-face-to-face time during this encounter.   [name], MD

## 2019-01-09 NOTE — Patient Instructions (Signed)
F/U as before, call if you need me sooner  Work excuse from today to return 01/14/2019  Lomotil is prescribed for as needed use for loose stool  Use Zofran that you already have for nausea   Please send reports of your Covid   tests , and follow up on the PCR test  Practice good hygiene , in the event that you may have an infection which can be transmitted , and also self quarantine until you are certain thatyou do not have [place] you feel better soon  Thanks for choosing [facility], we consider it a privelige to serve you.

## 2019-01-11 ENCOUNTER — Encounter: Payer: Self-pay | Admitting: Family Medicine

## 2019-01-11 NOTE — Assessment & Plan Note (Signed)
Controlled, no change in medication DASH diet and commitment to daily physical activity for a minimum of 30 minutes discussed and encouraged, as a part of hypertension management. The importance of attaining a healthy weight is also discussed.  BP/Weight 01/09/2019 12/10/2018 10/29/2018 10/05/2018 10/02/2018 [id number]  Systolic BP [id number] [id number] 123456 123456 123456  Diastolic BP 71 71 74 68 70 70 70  Wt. (Lbs) 201 - 209.12 219.04 207 207 215.04  BMI 37.98 - 39.51 40.06 39.11 39.11 40.63  Some encounter information is confidential and restricted. Go to Review Flowsheets activity to see all data.

## 2019-01-11 NOTE — Assessment & Plan Note (Signed)
Zofran already in patient's possession for as needed, use

## 2019-01-11 NOTE — Assessment & Plan Note (Signed)
Acute onset of watery stool associated with nausea. Lomtil prescribed for as needed use, encouraged to push fluids, follow BRAT and practice good hygiene. Work excuse x 1 day

## 2019-01-12 ENCOUNTER — Encounter: Payer: Self-pay | Admitting: Family Medicine

## 2019-01-14 ENCOUNTER — Other Ambulatory Visit: Payer: Self-pay | Admitting: Family Medicine

## 2019-01-14 MED ORDER — MECLIZINE HCL 25 MG PO TABS: 25.0000 mg | ORAL_TABLET | Freq: Three times a day (TID) | ORAL | 0 refills | Status: DC | PRN

## 2019-01-14 NOTE — Progress Notes (Signed)
Stephanie Cannon

## 2019-01-15 ENCOUNTER — Emergency Department (HOSPITAL_COMMUNITY)
Admission: EM | Admit: 2019-01-15 | Discharge: 2019-01-15 | Disposition: A | Discharge status: Home / Self Care | Payer: BC Managed Care – PPO | Attending: Emergency Medicine | Admitting: Emergency Medicine

## 2019-01-15 ENCOUNTER — Encounter: Payer: Self-pay | Admitting: Family Medicine

## 2019-01-15 ENCOUNTER — Ambulatory Visit (INDEPENDENT_AMBULATORY_CARE_PROVIDER_SITE_OTHER): Payer: BC Managed Care – PPO | Admitting: Family Medicine

## 2019-01-15 ENCOUNTER — Other Ambulatory Visit: Payer: Self-pay

## 2019-01-15 ENCOUNTER — Encounter (HOSPITAL_COMMUNITY): Payer: Self-pay | Admitting: Emergency Medicine

## 2019-01-15 VITALS — BP 111/71 | Ht 61.0 in | Wt 202.0 lb

## 2019-01-15 DIAGNOSIS — R112 Nausea with vomiting, unspecified: Secondary | ICD-10-CM | POA: Diagnosis not present

## 2019-01-15 DIAGNOSIS — Z87891 Personal history of nicotine dependence: Secondary | ICD-10-CM | POA: Insufficient documentation

## 2019-01-15 DIAGNOSIS — R42 Dizziness and giddiness: Secondary | ICD-10-CM

## 2019-01-15 DIAGNOSIS — R1110 Vomiting, unspecified: Secondary | ICD-10-CM | POA: Insufficient documentation

## 2019-01-15 DIAGNOSIS — E876 Hypokalemia: Secondary | ICD-10-CM

## 2019-01-15 DIAGNOSIS — R110 Nausea: Secondary | ICD-10-CM

## 2019-01-15 DIAGNOSIS — R1114 Bilious vomiting: Secondary | ICD-10-CM

## 2019-01-15 DIAGNOSIS — I10 Essential (primary) hypertension: Secondary | ICD-10-CM | POA: Insufficient documentation

## 2019-01-15 DIAGNOSIS — Z79899 Other long term (current) drug therapy: Secondary | ICD-10-CM | POA: Insufficient documentation

## 2019-01-15 LAB — CBC
HCT: 46.9 % — ABNORMAL HIGH (ref 36.0–46.0)
Hemoglobin: 15.3 g/dL — ABNORMAL HIGH (ref 12.0–15.0)
MCH: 30.2 pg (ref 26.0–34.0)
MCHC: 32.6 g/dL (ref 30.0–36.0)
MCV: 92.7 fL (ref 80.0–100.0)
Platelets: 270 10*3/uL (ref 150–400)
RBC: 5.06 MIL/uL (ref 3.87–5.11)
RDW: 13.8 % (ref 11.5–15.5)
WBC: 8.1 10*3/uL (ref 4.0–10.5)
nRBC: 0 % (ref 0.0–0.2)

## 2019-01-15 LAB — URINALYSIS, ROUTINE W REFLEX MICROSCOPIC
Bilirubin Urine: NEGATIVE
Glucose, UA: NEGATIVE mg/dL
Hgb urine dipstick: NEGATIVE
Ketones, ur: 5 mg/dL — AB
Leukocytes,Ua: NEGATIVE
Nitrite: NEGATIVE
Protein, ur: NEGATIVE mg/dL
Specific Gravity, Urine: 1.012 (ref 1.005–1.030)
pH: 6 (ref 5.0–8.0)

## 2019-01-15 LAB — COMPREHENSIVE METABOLIC PANEL
ALT: 19 U/L (ref 0–44)
AST: 23 U/L (ref 15–41)
Albumin: 4.3 g/dL (ref 3.5–5.0)
Alkaline Phosphatase: 85 U/L (ref 38–126)
Anion gap: 11 (ref 5–15)
BUN: 13 mg/dL (ref 6–20)
CO2: 29 mmol/L (ref 22–32)
Calcium: 9.5 mg/dL (ref 8.9–10.3)
Chloride: 98 mmol/L (ref 98–111)
Creatinine, Ser: 0.74 mg/dL (ref 0.44–1.00)
GFR calc Af Amer: >60 mL/min (ref >60)
GFR calc non Af Amer: >60 mL/min (ref >60)
Glucose, Bld: 88 mg/dL (ref 70–99)
Potassium: 2.9 mmol/L — ABNORMAL LOW (ref 3.5–5.1)
Sodium: 138 mmol/L (ref 135–145)
Total Bilirubin: 0.8 mg/dL (ref 0.3–1.2)
Total Protein: 8.4 g/dL — ABNORMAL HIGH (ref 6.5–8.1)

## 2019-01-15 LAB — LIPASE, BLOOD: Lipase: 33 U/L (ref 11–51)

## 2019-01-15 MED ORDER — POTASSIUM CHLORIDE CRYS ER 20 MEQ PO TBCR
40.0000 meq | EXTENDED_RELEASE_TABLET | Freq: Once | ORAL | Status: AC
  Administered 2019-01-15: 40 meq via ORAL
  Filled 2019-01-15: qty 2

## 2019-01-15 MED ORDER — ONDANSETRON HCL 4 MG/2ML IJ SOLN
4.0000 mg | Freq: Once | INTRAMUSCULAR | Status: AC
  Administered 2019-01-15: 4 mg via INTRAVENOUS
  Filled 2019-01-15: qty 2

## 2019-01-15 MED ORDER — SODIUM CHLORIDE 0.9 % IV BOLUS
1000.0000 mL | Freq: Once | INTRAVENOUS | Status: AC
  Administered 2019-01-15: 1000 mL via INTRAVENOUS

## 2019-01-15 MED ORDER — ONDANSETRON 4 MG PO TBDP: 4.0000 mg | ORAL_TABLET | Freq: Three times a day (TID) | ORAL | 0 refills | Status: DC | PRN

## 2019-01-15 MED ORDER — POTASSIUM CHLORIDE CRYS ER 20 MEQ PO TBCR: 20.0000 meq | EXTENDED_RELEASE_TABLET | Freq: Two times a day (BID) | ORAL | 0 refills | Status: DC

## 2019-01-15 MED ORDER — POTASSIUM CHLORIDE 10 MEQ/100ML IV SOLN
10.0000 meq | Freq: Once | INTRAVENOUS | Status: AC
  Administered 2019-01-15: 10 meq via INTRAVENOUS
  Filled 2019-01-15: qty 100

## 2019-01-15 NOTE — ED Notes (Signed)
Patient signed ED transfer rather than ED discharge.

## 2019-01-15 NOTE — ED Triage Notes (Signed)
Pt reports n/v/weakness, dizziness, headache x1.5 weeks.

## 2019-01-15 NOTE — ED Provider Notes (Signed)
[facility] EMERGENCY DEPARTMENT Provider Note   CSN: [id number] Arrival date & time: 01/15/19  1054     History Chief Complaint  Patient presents with  . Emesis    Stephanie Cannon is a 49 y.o. female.  HPI      Stephanie Cannon is a 49 y.o. female, with a history of HTN, hyperlipidemia, GERD, obesity, presenting to the ED with nausea and vomiting for about the past 4 to 5 days.  She is experience several episodes of nonbloody nonbilious emesis each day.  Accompanied by an overall feeling of generalized weakness with some lightheadedness intermittently. States through her job she is tested twice a week for Covid, both with PCR and POC tests.  Her POC test yesterday was negative with her PCR test pending.  Denies fever/chills, diarrhea, hematochezia/melena, chest pain, shortness of breath, cough, syncope, urinary symptoms, abdominal pain, or any other complaints.    Past Medical History:  Diagnosis Date  . Arthritis   . Breast discharge    left spont white discharge x 6 months  . Carpal tunnel syndrome    left, wears brace at night  . Chronic back pain   . GERD (gastroesophageal reflux disease)   . Hyperlipidemia   . Hypertension   . Obesity     Patient Active Problem List   Diagnosis Date Noted  . Vomiting 01/15/2019  . Light headed 01/15/2019  . Insomnia 09/19/2018  . Superficial inflammatory acne vulgaris 07/14/2018  . Chronic left flank pain 07/10/2018  . Urinary frequency 01/23/2018  . Knee pain, left anterior 11/01/2017  . 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 Spondylolisthesis of lumbar region 01/22/2014  . Urinary incontinence without sensory awareness 07/19/2013  . Accessory skin tags 04/03/2013  . Prediabetes 12/17/2012  . Watery stools 08/23/2012  . Metabolic syndrome X [id number]  . Hot flashes due to surgical menopause 12/06/2011  . Nausea 12/06/2011  . Anxiety 03/01/2011  . Hyperlipidemia LDL goal <100 07/08/2010  . HEMORRHOIDS 04/27/2009    . CARPAL TUNNEL SYNDROME 06/26/2007  . Essential hypertension 06/26/2007  . GERD 06/26/2007  . Spasm of muscle of lower back 06/26/2007    Past Surgical History:  Procedure Laterality Date  . ABDOMINAL HYSTERECTOMY  2003   partial  . BREAST BIOPSY Left 2018   benign  . CARPAL TUNNEL RELEASE Right   . CESAREAN SECTION  1988  . CHOLECYSTECTOMY    . COLONOSCOPY N/A 09/21/2012   Procedure: COLONOSCOPY;  Surgeon: Danie Binder, MD;  Location: [facility] SUITE;  Service: Endoscopy;  Laterality: N/A;  9:30  . LAPAROSCOPIC GASTRIC SLEEVE RESECTION WITH HIATAL HERNIA REPAIR N/A 06/23/2014   Procedure: LAPAROSCOPIC LYSIS OF ADHESIONS, GASTRIC SLEEVE RESECTION AND UPPER ENDO;  Surgeon: Alphonsa Overall, MD;  Location: [facility] ORS;  Service: General;  Laterality: N/A;  . LAPAROSCOPIC LYSIS OF ADHESIONS  06/23/2014   Procedure: LAPAROSCOPIC LYSIS OF ADHESIONS;  Surgeon: Alphonsa Overall, MD;  Location: [facility] ORS;  Service: General;;  . LUMBAR FUSION  01/22/2014   L4 L5       DR Arnoldo Morale  . other     Vocal chord polyp removal  . 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 TONSILLECTOMY    . UPPER GI ENDOSCOPY  06/23/2014   Procedure: UPPER GI ENDOSCOPY;  Surgeon: Alphonsa Overall, MD;  Location: [facility] ORS;  Service: General;;     OB History   No obstetric history on file.     Family History  Problem Relation Age of Onset  . Hypertension Mother   . Hypertension Father   . Hypertension Brother   . Cancer Maternal Aunt   . Cancer Maternal Uncle        liver  . Cancer Paternal Uncle        bone cancer  . Diabetes Maternal Grandmother   . Colon cancer Neg Hx     Social History   Tobacco Use  . Smoking status: Former Smoker    Types: Cigarettes  . Smokeless tobacco: Never Used  . Tobacco comment: quit smoking 2011  Substance Use Topics  . Alcohol use: Yes    Comment: occasionally  . Drug use: No    Home Medications Prior to Admission medications   Medication Sig Start Date End Date Taking?  Authorizing Provider  ALPRAZolam Duanne Moron) 1 MG tablet Take 1 tablet (1 mg total) by mouth at bedtime. 11/02/18 01/31/19 Yes Fayrene Helper, MD  cyclobenzaprine (FLEXERIL) 10 MG tablet TAKE 1 TABLET BY MOUTH AT BEDTIME. 10/29/18  Yes Fayrene Helper, MD  diphenoxylate-atropine (LOMOTIL) 2.5-0.025 MG tablet Take 1 tablet by mouth 4 (four) times daily as needed for diarrhea or loose stools. 01/09/19  Yes Fayrene Helper, MD  esomeprazole (NEXIUM) 40 MG capsule TAKE (1) CAPSULE BY MOUTH ONCE DAILY. 11/19/18  Yes Fayrene Helper, MD  FLUoxetine (PROZAC) 20 MG tablet Take 1 tablet (20 mg total) by mouth daily. 10/29/18  Yes Fayrene Helper, MD  ibuprofen (ADVIL) 800 MG tablet Take 1 tablet (800 mg total) by mouth every 8 (eight) hours as needed. 11/20/18  Yes Fayrene Helper, MD  meclizine (ANTIVERT) 25 MG tablet Take 1 tablet (25 mg total) by mouth 3 (three) times daily as needed for dizziness. 01/14/19  Yes Fayrene Helper, MD  meloxicam [facility]) 15 MG tablet Take one tablet once daily as needed, for uncontrolled back pain 05/07/18  Yes Fayrene Helper, MD  Multiple Vitamins-Minerals (MULTIVITAMIN GUMMIES ADULT PO) Take 1 each by mouth 2 (two) times daily.   Yes [provider]  potassium chloride (KLOR-CON 10) 10 MEQ tablet Take 1 tablet (10 mEq total) by mouth daily. 10/29/18  Yes Fayrene Helper, MD  SAXENDA 18 MG/3ML SOPN Inject 3 mg into the skin daily.  01/10/18  Yes [provider]  triamterene-hydrochlorothiazide (MAXZIDE) 75-50 MG tablet TAKE 1 TABLET BY MOUTH ONCE DAILY. 01/02/19  Yes Fayrene Helper, MD  benzonatate (TESSALON) 100 MG capsule Take 1 capsule (100 mg total) by mouth every 8 (eight) hours. Patient not taking: Reported on 01/15/2019 12/10/18   [place], [place], PA-C  clindamycin-benzoyl peroxide (BENZACLIN) gel Apply topically 2 (two) times daily. Patient not taking: Reported on 01/15/2019 07/10/18   Fayrene Helper, MD  ondansetron  (ZOFRAN ODT) 4 MG disintegrating tablet Take 1 tablet (4 mg total) by mouth every 8 (eight) hours as needed for nausea or vomiting. 01/15/19   Hannahmarie Asberry C, PA-C  potassium chloride SA (KLOR-CON) 20 MEQ tablet Take 1 tablet (20 mEq total) by mouth 2 (two) times daily for 5 days. 01/15/19 01/20/19  Keeshawn Fakhouri C, PA-C  temazepam (RESTORIL) 15 MG capsule Take 1 capsule (15 mg total) by mouth at bedtime as needed for sleep. Patient not taking: Reported on 01/15/2019 10/19/18   Fayrene Helper, MD    Allergies    Codeine and Morphine  Review of Systems   Review of Systems  Constitutional: Negative for chills, diaphoresis and fever.  Respiratory: Negative for  cough and shortness of breath.   Cardiovascular: Negative for chest pain and leg swelling.  Gastrointestinal: Positive for nausea and vomiting. Negative for abdominal pain, blood in stool and diarrhea.  Genitourinary: Negative for dysuria, frequency and hematuria.  Musculoskeletal: Negative for back pain.  Neurological: Positive for light-headedness. Negative for syncope.  All other systems reviewed and are negative.   Physical Exam Updated Vital Signs BP (!) 148/79 (BP Location: Right Arm)   Pulse 94   Temp 98.2 F (36.8 C) (Oral)   Resp 18   Ht 5\' 1"  (1.549 m)   Wt 91 kg   SpO2 100%   BMI 37.91 kg/m   Physical Exam Vitals and nursing note reviewed.  Constitutional:      General: She is not in acute distress.    Appearance: She is well-developed. She is not diaphoretic.  HENT:     Head: Normocephalic and atraumatic.     Mouth/Throat:     Mouth: Mucous membranes are moist.     Pharynx: Oropharynx is clear.  Eyes:     Conjunctiva/sclera: Conjunctivae normal.  Cardiovascular:     Rate and Rhythm: Normal rate and regular rhythm.     Pulses: Normal pulses.          Radial pulses are 2+ on the right side and 2+ on the left side.       Posterior tibial pulses are 2+ on the right side and 2+ on the left side.     Heart  sounds: Normal heart sounds.     Comments: Tactile temperature in the extremities appropriate and equal bilaterally. Pulmonary:     Effort: Pulmonary effort is normal. No respiratory distress.     Breath sounds: Normal breath sounds.  Abdominal:     Palpations: Abdomen is soft.     Tenderness: There is no abdominal tenderness. There is no guarding.  Musculoskeletal:     Cervical back: Neck supple.     Right lower leg: No edema.     Left lower leg: No edema.  Lymphadenopathy:     Cervical: No cervical adenopathy.  Skin:    General: Skin is warm and dry.  Neurological:     Mental Status: She is alert.  Psychiatric:        Mood and Affect: Mood and affect normal.        Speech: Speech normal.        Behavior: Behavior normal.     ED Results / Procedures / Treatments   Labs (all labs ordered are listed, but only abnormal results are displayed) Labs Reviewed  COMPREHENSIVE METABOLIC PANEL - Abnormal; Notable for the following components:      Result Value   Potassium 2.9 (*)    Total Protein 8.4 (*)    All other components within normal limits  CBC - Abnormal; Notable for the following components:   Hemoglobin 15.3 (*)    HCT 46.9 (*)    All other components within normal limits  URINALYSIS, ROUTINE W REFLEX MICROSCOPIC - Abnormal; Notable for the following components:   Ketones, ur 5 (*)    All other components within normal limits  LIPASE, BLOOD    EKG None  Radiology No results found.  Procedures Procedures (including critical care time)  Medications Ordered in ED Medications  ondansetron (ZOFRAN) injection 4 mg (4 mg Intravenous Given 01/15/19 1358)  sodium chloride 0.9 % bolus 1,000 mL (0 mLs Intravenous Stopped 01/15/19 1532)  potassium chloride SA (KLOR-CON) CR tablet 40  mEq (40 mEq Oral Given 01/15/19 1539)  potassium chloride 10 mEq in 100 mL IVPB (0 mEq Intravenous Stopped 01/15/19 1532)    ED Course  I have reviewed the triage vital signs and the  nursing notes.  Pertinent labs & imaging results that were available during my care of the patient were reviewed by me and considered in my medical decision making (see chart for details).    MDM Rules/Calculators/A&P                      Patient presents with nausea and vomiting for the last several days. Patient is nontoxic appearing, afebrile, not tachycardic, not tachypneic, not hypotensive, maintains excellent SPO2 on room air, and is in no apparent distress.  Hypokalemia was noted and addressed here in the ED, which I suspect is due to her vomiting.  Supplementation will continue at home.  Mild ketonuria suspected to be due to some dehydration.  Addressed with IV fluids.  Tolerating PO fluids at time of discharge. The patient was given instructions for home care as well as return precautions. Patient voices understanding of these instructions, accepts the plan, and is comfortable with discharge.  Vitals:   01/15/19 1115 01/15/19 1116 01/15/19 1511  BP: (!) 148/79  138/74  Pulse: 94  74  Resp: 18    Temp: 98.2 F (36.8 C)    TempSrc: Oral    SpO2: 100%  100%  Weight:  91 kg   Height:  5\' 1"  (1.549 m)      Final Clinical Impression(s) / ED Diagnoses Final diagnoses:  Non-intractable vomiting with nausea, unspecified vomiting type  Hypokalemia    Rx / DC Orders ED Discharge Orders         Ordered    ondansetron (ZOFRAN ODT) 4 MG disintegrating tablet  Every 8 hours PRN     01/15/19 1532    potassium chloride SA (KLOR-CON) 20 MEQ tablet  2 times daily     01/15/19 [place], Canio Winokur C, PA-C 01/15/19 1606    Nat Christen, MD 01/16/19 1007

## 2019-01-15 NOTE — Patient Instructions (Signed)
F/u PHONE VISIT ON 01/21/2019 WITH MD , Cqll if  you need me sooner  You need to go to the ed for evaluation today, as soon as possible  Work excuse starting today until re evaluated on 01/21/2019

## 2019-01-15 NOTE — Assessment & Plan Note (Signed)
4 episodes of billious vomit t today with light headednesss and dizziness , recommend eD eval

## 2019-01-15 NOTE — Assessment & Plan Note (Signed)
Reports light headedness, needing to pull off street and be collected, advised ED eval

## 2019-01-15 NOTE — Assessment & Plan Note (Signed)
3 day h/o nausea and poor appetite on symptomatic med, no improvement, needs ed eval

## 2019-01-15 NOTE — Discharge Instructions (Addendum)
  Nausea and Vomiting  Hand washing: Wash your hands throughout the day, but especially before and after touching the face, using the restroom, sneezing, coughing, or touching surfaces that have been coughed or sneezed upon. Hydration: Symptoms will be intensified and complicated by dehydration. Dehydration can also extend the duration of symptoms. Drink plenty of fluids and get plenty of rest. You should be drinking at least half a liter of water an hour to stay hydrated. Electrolyte drinks (ex. Gatorade, Powerade, Pedialyte) are also encouraged. You should be drinking enough fluids to make your urine light yellow, almost clear. If this is not the case, you are not drinking enough water. Please note that some of the treatments indicated below will not be effective if you are not adequately hydrated. Diet: Please concentrate on hydration, however, you may introduce food slowly.  Start with a clear liquid diet, progressed to a full liquid diet, and then bland solids as you are able. Pain or fever: Ibuprofen, Naproxen, or Tylenol for pain or fever.  Nausea/vomiting: Use the ondansetron (generic for Zofran) for nausea or vomiting.  This medication may not prevent all vomiting or nausea, but can help facilitate better hydration. Things that can help with nausea/vomiting also include peppermint/menthol candies, vitamin B12, and ginger. Diarrhea: May use medications such as loperamide (Imodium) or Bismuth subsalicylate (Pepto-Bismol). Follow-up: Follow-up with a primary care provider on this matter. Return: Return should you develop a fever, bloody diarrhea, increased abdominal pain, uncontrolled vomiting, or any other major concerns.  Low potassium: It was also noted that your potassium was lower than normal.  This may be due to your vomiting.  Finish the supplementation regimen and follow-up with your doctor for retesting of this value.  For prescription assistance, may try using prescription discount  sites or apps, such as goodrx.com

## 2019-01-15 NOTE — Progress Notes (Signed)
Virtual Visit via Telephone Note  I connected with Stephanie Cannon on 01/15/19 at  9:20 AM EST by telephone and verified that I am speaking with the correct person using two identifiers.  Location: Patient: home  Provider: office   I discussed the limitations, risks, security and privacy concerns of performing an evaluation and management service by telephone and the availability of in person appointments. I also discussed with the patient that there may be a patient responsible charge related to this service. The patient expressed understanding and agreed to proceed.   History of Present Illness: Nausea and poor appetite x 3 days, chills, no fever Vomit  X 4 episodes this morning before leaving for work, became light headed and dizzy had to pull off the road and be collected Cough and phlegm, sinus pressure, left ear discomfort  X  1week no sore throat  Observations/Objective: BP 111/71   Ht 5\' 1"  (1.549 m)   Wt 202 lb (91.6 kg)   BMI 38.17 kg/m  Good communication with no confusion and intact memory. Sounds drowsy, but able to proviode history. No cough during visit  Assessment and Plan: Vomiting 4 episodes of billious vomit t today with light headednesss and dizziness , recommend eD eval  Nausea 3 day h/o nausea and poor appetite on symptomatic med, no improvement, needs ed eval  Light headed Reports light headedness, needing to pull off street and be collected, advised ED eval    Follow Up Instructions:    I discussed the assessment and treatment plan with the patient. The patient was provided an opportunity to ask questions and all were answered. The patient agreed with the plan and demonstrated an understanding of the instructions.   The patient was advised to call back or seek an in-person evaluation if the symptoms worsen or if the condition fails to improve as anticipated.  I provided 12 minutes of non-face-to-face time during this encounter.   Stephanie Nakayama, MD

## 2019-01-17 ENCOUNTER — Other Ambulatory Visit: Payer: Self-pay | Admitting: Family Medicine

## 2019-01-21 ENCOUNTER — Other Ambulatory Visit: Payer: Self-pay

## 2019-01-21 ENCOUNTER — Encounter: Payer: Self-pay | Admitting: Family Medicine

## 2019-01-21 ENCOUNTER — Ambulatory Visit (INDEPENDENT_AMBULATORY_CARE_PROVIDER_SITE_OTHER): Payer: BC Managed Care – PPO | Admitting: Family Medicine

## 2019-01-21 VITALS — BP 138/74 | Ht 61.0 in | Wt 205.0 lb

## 2019-01-21 DIAGNOSIS — R05 Cough: Secondary | ICD-10-CM

## 2019-01-21 DIAGNOSIS — H9202 Otalgia, left ear: Secondary | ICD-10-CM | POA: Diagnosis not present

## 2019-01-21 DIAGNOSIS — I10 Essential (primary) hypertension: Secondary | ICD-10-CM

## 2019-01-21 DIAGNOSIS — R062 Wheezing: Secondary | ICD-10-CM | POA: Diagnosis not present

## 2019-01-21 DIAGNOSIS — J40 Bronchitis, not specified as acute or chronic: Secondary | ICD-10-CM | POA: Diagnosis not present

## 2019-01-21 DIAGNOSIS — R42 Dizziness and giddiness: Secondary | ICD-10-CM

## 2019-01-21 DIAGNOSIS — R059 Cough, unspecified: Secondary | ICD-10-CM

## 2019-01-21 DIAGNOSIS — E876 Hypokalemia: Secondary | ICD-10-CM

## 2019-01-21 MED ORDER — AZITHROMYCIN 250 MG PO TABS: ORAL_TABLET | ORAL | 0 refills | Status: DC

## 2019-01-21 MED ORDER — ALBUTEROL SULFATE HFA 108 (90 BASE) MCG/ACT IN AERS: 2.0000 | INHALATION_SPRAY | Freq: Four times a day (QID) | RESPIRATORY_TRACT | 0 refills | Status: DC | PRN

## 2019-01-21 NOTE — Patient Instructions (Addendum)
F/u in office in March as before, call if you  need me sooner  Please get CXR today  You are referred to ENT for left ear pain and dizziness, soonest available  Azithromycin and albuterol are prescribed  Please take potassium 10 meq , TWO twice daily for 4 days, then return to old dose of one daily Repeat non fasting chem 7 and EGFR on 01/25/2019, re evaluate low potassium  Work note from 01/15/2019 to return 01/28/2019  Thanks for choosing [facility], we consider it a privelige to serve you.

## 2019-01-21 NOTE — Progress Notes (Signed)
Virtual Visit via Telephone Note  I connected with Stephanie Cannon on 01/21/19 at 10:40 AM EST by telephone and verified that I am speaking with the correct person using two identifiers.  Location: Patient: home Provider:office   I discussed the limitations, risks, security and privacy concerns of performing an evaluation and management service by telephone and the availability of in person appointments. I also discussed with the patient that there may be a patient responsible charge related to this service. The patient expressed understanding and agreed to proceed.   History of Present Illness: 1 week h/o intermittent dizziness and left sided dull pain in ear  2 week h/o intermittent cough with yellowish mucus, several negative covid tests, has had y to pull over when driving due t symptoms of weakness and dizziness Was evaluated in ED on 01/12 for vomiting and nausea for 5 days , noted to have low potassium also. Gradually improving . Denies sinus pressure, nasal congestion, ear pain or sore throat.  Denies chest pains, palpitations and leg swelling  Denies dysuria, frequency, hesitancy or incontinence. .       Observations/Objective: BP 138/74   Ht 5\' 1"  (1.549 m)   Wt 205 lb (93 kg)   BMI 38.73 kg/m  Good communication with no confusion and intact memory. Alert and oriented x 3 No signs of respiratory distress during speech   Assessment and Plan:  Dizziness Intermittent disabling vertigo  X 2 weeks and left ear pain refer eNT  Otalgia of left ear Current flar over last 3 weeks, refer ENT  Watery stools Improving and resulting in hypokalemia, aggressive potassium supplement in eD and ongoing daily potassium needed  Bronchitis CXR, z pack, albuterol  and work excuse x 1 week  Essential hypertension Controlled, no change in medication    Follow Up Instructions:    I discussed the assessment and treatment plan with the patient. The patient was provided an  opportunity to ask questions and all were answered. The patient agreed with the plan and demonstrated an understanding of the instructions.   The patient was advised to call back or seek an in-person evaluation if the symptoms worsen or if the condition fails to improve as anticipated.  I provided 15 minutes of non-face-to-face time during this encounter.   [name], MD

## 2019-01-22 ENCOUNTER — Encounter: Payer: Self-pay | Admitting: Family Medicine

## 2019-01-22 ENCOUNTER — Encounter (HOSPITAL_COMMUNITY): Payer: Self-pay

## 2019-01-22 ENCOUNTER — Ambulatory Visit (HOSPITAL_COMMUNITY)
Admission: RE | Admit: 2019-01-22 | Discharge: 2019-01-22 | Disposition: A | Discharge status: Home / Self Care | Payer: BC Managed Care – PPO | Source: Ambulatory Visit | Attending: Family Medicine | Admitting: Family Medicine

## 2019-01-22 ENCOUNTER — Other Ambulatory Visit: Payer: Self-pay

## 2019-01-22 DIAGNOSIS — R062 Wheezing: Secondary | ICD-10-CM | POA: Insufficient documentation

## 2019-01-22 DIAGNOSIS — R05 Cough: Secondary | ICD-10-CM | POA: Insufficient documentation

## 2019-01-22 DIAGNOSIS — R059 Cough, unspecified: Secondary | ICD-10-CM

## 2019-01-22 NOTE — ED Provider Notes (Signed)
.  Critical Care Performed by: Lorayne Bender, PA-C Authorized by: Lorayne Bender, PA-C   Critical care provider statement:    Critical care time (minutes):  35   Critical care was necessary to treat or prevent imminent or life-threatening deterioration of the following conditions:  Metabolic crisis   Critical care was time spent personally by me on the following activities:  Development of treatment plan with patient or surrogate, evaluation of patient's response to treatment, examination of patient, obtaining history from patient or surrogate, ordering and performing treatments and interventions, ordering and review of laboratory studies, pulse oximetry, re-evaluation of patient's condition and review of old charts   I assumed direction of critical care for this patient from another provider in my specialty: no       Lorayne Bender, PA-C 01/22/19 0723    Nat Christen, MD 01/25/19 1325

## 2019-01-26 ENCOUNTER — Encounter: Payer: Self-pay | Admitting: Family Medicine

## 2019-01-26 NOTE — Assessment & Plan Note (Signed)
Controlled, no change in medication  

## 2019-01-26 NOTE — Assessment & Plan Note (Signed)
Intermittent disabling vertigo  X 2 weeks and left ear pain refer eNT

## 2019-01-26 NOTE — Assessment & Plan Note (Signed)
Current flar over last 3 weeks, refer ENT

## 2019-01-26 NOTE — Assessment & Plan Note (Addendum)
CXR, z pack, albuterol  and work excuse x 1 week

## 2019-01-26 NOTE — Assessment & Plan Note (Signed)
Improving and resulting in hypokalemia, aggressive potassium supplement in eD and ongoing daily potassium needed

## 2019-01-28 ENCOUNTER — Other Ambulatory Visit: Payer: Self-pay | Admitting: Family Medicine

## 2019-01-28 ENCOUNTER — Telehealth: Payer: Self-pay

## 2019-01-28 ENCOUNTER — Encounter: Payer: Self-pay | Admitting: Family Medicine

## 2019-01-28 DIAGNOSIS — R059 Cough, unspecified: Secondary | ICD-10-CM

## 2019-01-28 DIAGNOSIS — R05 Cough: Secondary | ICD-10-CM

## 2019-01-28 DIAGNOSIS — R058 Other specified cough: Secondary | ICD-10-CM

## 2019-01-28 MED ORDER — PROMETHAZINE-DM 6.25-15 MG/5ML PO SYRP: ORAL_SOLUTION | ORAL | 0 refills | Status: DC

## 2019-01-28 MED ORDER — PREDNISONE 5 MG (21) PO TBPK: 5.0000 mg | ORAL_TABLET | ORAL | 0 refills | Status: DC

## 2019-01-28 NOTE — Telephone Encounter (Signed)
Will let patient know resp culture ordered and she can do it tomorrow am

## 2019-01-29 ENCOUNTER — Telehealth: Payer: Self-pay | Admitting: Family Medicine

## 2019-01-29 NOTE — Telephone Encounter (Signed)
Work excuse typed and printed and placed in brown folder until patient calls back letting us know how she wants to get it.

## 2019-01-29 NOTE — Telephone Encounter (Signed)
pls provide work excuse for 1/25 to return 02/18/2019. You may call and ask her where this needs to be sent to,or if she would rather collect. Start date originally was 01/12 to 01/25, but still having symptoms so hhis is extended

## 2019-01-29 NOTE — Telephone Encounter (Signed)
Work excuse Restaurant manager, [employer] to patient.

## 2019-01-30 ENCOUNTER — Telehealth: Payer: Self-pay | Admitting: *Deleted

## 2019-01-30 NOTE — Telephone Encounter (Signed)
Fmla paper work received also credit paper received copied noted sleeved

## 2019-01-31 ENCOUNTER — Telehealth: Payer: Self-pay | Admitting: *Deleted

## 2019-01-31 NOTE — Telephone Encounter (Signed)
Updated fmla received copied noted sleeved

## 2019-02-04 DIAGNOSIS — H9202 Otalgia, left ear: Secondary | ICD-10-CM

## 2019-02-07 DIAGNOSIS — I10 Essential (primary) hypertension: Secondary | ICD-10-CM

## 2019-02-13 ENCOUNTER — Encounter: Payer: Self-pay | Admitting: Family Medicine

## 2019-02-14 ENCOUNTER — Other Ambulatory Visit: Payer: Self-pay | Admitting: Family Medicine

## 2019-02-14 MED ORDER — DICLOFENAC SODIUM 75 MG PO TBEC: DELAYED_RELEASE_TABLET | ORAL | 1 refills | Status: DC

## 2019-02-14 NOTE — Progress Notes (Signed)
voltaren tab

## 2019-02-18 ENCOUNTER — Encounter: Payer: Self-pay | Admitting: Family Medicine

## 2019-02-28 ENCOUNTER — Other Ambulatory Visit: Payer: Self-pay | Admitting: Family Medicine

## 2019-02-28 ENCOUNTER — Encounter: Payer: Self-pay | Admitting: Family Medicine

## 2019-02-28 DIAGNOSIS — F324 Major depressive disorder, single episode, in partial remission: Secondary | ICD-10-CM

## 2019-02-28 DIAGNOSIS — F411 Generalized anxiety disorder: Secondary | ICD-10-CM

## 2019-03-14 ENCOUNTER — Ambulatory Visit: Payer: BC Managed Care – PPO | Attending: Family

## 2019-03-14 DIAGNOSIS — Z23 Encounter for immunization: Secondary | ICD-10-CM

## 2019-03-14 NOTE — Progress Notes (Signed)
   Covid-19 Vaccination Clinic  Name:  Stephanie Cannon    MRN: [id number] DOB: 10-02-1970  03/14/2019  Stephanie Cannon was observed post Covid-19 immunization for 15 minutes without incident. She was provided with Vaccine Information Sheet and instruction to access the V-Safe system.   Stephanie Cannon was instructed to call 911 with any severe reactions post vaccine: Marland Kitchen Difficulty breathing  . Swelling of face and throat  . A fast heartbeat  . A bad rash all over body  . Dizziness and weakness   Immunizations Administered    Name Date Dose VIS Date Route   Moderna COVID-19 Vaccine 03/14/2019 12:16 PM 0.5 mL 12/04/2018 Intramuscular   Manufacturer: Moderna   Lot: [id number]   [place]:[id number]

## 2019-04-02 ENCOUNTER — Ambulatory Visit: Payer: BC Managed Care – PPO | Admitting: Family Medicine

## 2019-04-16 ENCOUNTER — Ambulatory Visit: Payer: BC Managed Care – PPO | Attending: Family

## 2019-04-16 DIAGNOSIS — Z23 Encounter for immunization: Secondary | ICD-10-CM

## 2019-04-16 NOTE — Progress Notes (Signed)
   Covid-19 Vaccination Clinic  Name:  Stephanie Cannon    MRN: [id number] DOB: October 20, 1970  04/16/2019  [name] was observed post Covid-19 immunization for 15 minutes without incident. She was provided with Vaccine Information Sheet and instruction to access the V-Safe system.   [name] was instructed to call 911 with any severe reactions post vaccine: Marland Kitchen Difficulty breathing  . Swelling of face and throat  . A fast heartbeat  . A bad rash all over body  . Dizziness and weakness   Immunizations Administered    Name Date Dose VIS Date Route   Moderna COVID-19 Vaccine 04/16/2019 12:24 PM 0.5 mL 12/04/2018 Intramuscular   Manufacturer: Moderna   Lot: [id number]   [id number]:[id number]

## 2019-04-19 ENCOUNTER — Other Ambulatory Visit: Payer: Self-pay | Admitting: Family Medicine

## 2019-04-25 ENCOUNTER — Encounter: Payer: Self-pay | Admitting: Family Medicine

## 2019-04-25 ENCOUNTER — Other Ambulatory Visit: Payer: Self-pay | Admitting: Family Medicine

## 2019-04-26 ENCOUNTER — Telehealth: Payer: Self-pay | Admitting: *Deleted

## 2019-04-26 ENCOUNTER — Other Ambulatory Visit: Payer: Self-pay | Admitting: Family Medicine

## 2019-04-26 MED ORDER — ALPRAZOLAM 1 MG PO TABS: 1.0000 mg | ORAL_TABLET | Freq: Every day | ORAL | 0 refills | Status: DC

## 2019-04-26 NOTE — Telephone Encounter (Signed)
30 day supply senr to [place], please let her know, also needs to keep May apt

## 2019-04-26 NOTE — Telephone Encounter (Signed)
PT notified with verbal understanding

## 2019-04-26 NOTE — Telephone Encounter (Signed)
Pt made an appt today called let her know we need urine

## 2019-04-26 NOTE — Telephone Encounter (Signed)
Pt is requesting a temporary supply on her xanax. She cannot come in to leave urine until 05-09-19. Put her in for appt and urine on the same day. She says she cannot sleep without this medication due to her insomnia

## 2019-05-01 ENCOUNTER — Encounter: Payer: Self-pay | Admitting: Family Medicine

## 2019-05-06 ENCOUNTER — Other Ambulatory Visit: Payer: Self-pay | Admitting: Family Medicine

## 2019-05-08 ENCOUNTER — Other Ambulatory Visit: Payer: Self-pay | Admitting: Family Medicine

## 2019-05-09 ENCOUNTER — Ambulatory Visit: Payer: BC Managed Care – PPO | Admitting: Family Medicine

## 2019-05-09 MED ORDER — ALBUTEROL SULFATE HFA 108 (90 BASE) MCG/ACT IN AERS: 2.0000 | INHALATION_SPRAY | Freq: Four times a day (QID) | RESPIRATORY_TRACT | 5 refills | Status: DC | PRN

## 2019-05-13 ENCOUNTER — Encounter: Payer: Self-pay | Admitting: Family Medicine

## 2019-05-23 ENCOUNTER — Encounter: Payer: Self-pay | Admitting: Family Medicine

## 2019-05-29 ENCOUNTER — Encounter: Payer: Self-pay | Admitting: Family Medicine

## 2019-06-10 ENCOUNTER — Other Ambulatory Visit: Payer: Self-pay | Admitting: Family Medicine

## 2019-06-11 ENCOUNTER — Other Ambulatory Visit: Payer: Self-pay | Admitting: Family Medicine

## 2019-06-11 MED ORDER — ALPRAZOLAM 1 MG PO TABS: 1.0000 mg | ORAL_TABLET | Freq: Every day | ORAL | 0 refills | Status: DC

## 2019-06-18 ENCOUNTER — Other Ambulatory Visit: Payer: Self-pay

## 2019-06-18 ENCOUNTER — Encounter: Payer: Self-pay | Admitting: Family Medicine

## 2019-06-18 ENCOUNTER — Ambulatory Visit (INDEPENDENT_AMBULATORY_CARE_PROVIDER_SITE_OTHER): Payer: BC Managed Care – PPO | Admitting: Family Medicine

## 2019-06-18 VITALS — BP 142/84 | HR 68 | Temp 97.4°F | Ht 61.0 in | Wt 194.0 lb

## 2019-06-18 DIAGNOSIS — M62838 Other muscle spasm: Secondary | ICD-10-CM | POA: Diagnosis not present

## 2019-06-18 DIAGNOSIS — M542 Cervicalgia: Secondary | ICD-10-CM | POA: Insufficient documentation

## 2019-06-18 DIAGNOSIS — M4316 Spondylolisthesis, lumbar region: Secondary | ICD-10-CM

## 2019-06-18 DIAGNOSIS — G4709 Other insomnia: Secondary | ICD-10-CM | POA: Diagnosis not present

## 2019-06-18 MED ORDER — METHYLPREDNISOLONE ACETATE 80 MG/ML IJ SUSP
80.0000 mg | Freq: Once | INTRAMUSCULAR | Status: AC
  Administered 2019-06-18: 80 mg via INTRAMUSCULAR

## 2019-06-18 MED ORDER — KETOROLAC TROMETHAMINE 60 MG/2ML IM SOLN
60.0000 mg | Freq: Once | INTRAMUSCULAR | Status: AC
  Administered 2019-06-18: 60 mg via INTRAMUSCULAR

## 2019-06-18 NOTE — Progress Notes (Signed)
Subjective:  Patient ID: Stephanie Cannon, female    DOB: March 11, 1970  Age: 49 y.o. MRN: [id number]  CC:  Chief Complaint  Patient presents with  . Back Pain  . Neck Pain      HPI  HPI Stephanie Cannon is a 49 year old female patient of Dr. Griffin Dakin.  She presents today as she is having back pain and neck pain.  Back pain: Lower back pain.  Reports it is 6 out of 10 in the office today.  Is on and off every now and then for a while.  She is due to have some back procedure back pain is giving her more trouble currently.  Is willing to have injections today.  She denies having any excessive radiation or cauda equina-like syndromes.  neck pain: She describes it as sharp pain that goes from the head as she turns she feels like it is like a vein somewhere in the back of her neck.  She is wanting to check to make sure everything is okay.  She does sit at a desk and tilts her head to use her phone.  She is not having any pain with this at this time.  She reports she is not been sleeping very well.  She does not really drink caffeine.  She does really feel stressed or anxious.  She does down to 5 right upper to going to bed.  She will sleep for an hour and then wake up for a little bit be awake for about 30 minutes and sleep for an hour or 2 again in the on and on throughout the night.  She denies having any headaches, chest pain, palpitations, leg swelling, vision changes, dizziness or any other signs or symptoms of elevated blood pressure or infection.  Today patient denies signs and symptoms of COVID 19 infection including fever, chills, cough, shortness of breath, and headache. Past Medical, Surgical, Social History, Allergies, and Medications have been Reviewed.   Past Medical History:  Diagnosis Date  . Arthritis   . Breast discharge    left spont white discharge x 6 months  . Carpal tunnel syndrome    left, wears brace at night  . Chronic back pain   . GERD (gastroesophageal reflux  disease)   . Hyperlipidemia   . Hypertension   . Obesity     Current Meds  Medication Sig  . albuterol (VENTOLIN HFA) 108 (90 Base) MCG/ACT inhaler Inhale 2 puffs into the lungs every 6 (six) hours as needed for wheezing or shortness of breath.  . ALPRAZolam (XANAX) 1 MG tablet Take 1 tablet (1 mg total) by mouth at bedtime.  . cyclobenzaprine (FLEXERIL) 10 MG tablet TAKE 1 TABLET BY MOUTH AT BEDTIME.  Marland Kitchen esomeprazole (NEXIUM) 40 MG capsule TAKE ONE CAPSULE BY MOUTH ONCE DAILY.  Marland Kitchen FLUoxetine (PROZAC) 20 MG tablet Take 1 tablet (20 mg total) by mouth daily.  . meclizine (ANTIVERT) 25 MG tablet Take 1 tablet (25 mg total) by mouth 3 (three) times daily as needed for dizziness.  . Multiple Vitamins-Minerals (MULTIVITAMIN GUMMIES ADULT PO) Take 1 each by mouth 2 (two) times daily.  . ondansetron (ZOFRAN ODT) 4 MG disintegrating tablet Take 1 tablet (4 mg total) by mouth every 8 (eight) hours as needed for nausea or vomiting.  Marland Kitchen SAXENDA 18 MG/3ML SOPN Inject 3 mg into the skin daily.   Marland Kitchen triamterene-hydrochlorothiazide (MAXZIDE) 75-50 MG tablet TAKE 1 TABLET BY MOUTH ONCE DAILY.  . [DISCONTINUED] ALPRAZolam (XANAX) 1 MG  tablet Take 1 tablet (1 mg total) by mouth at bedtime.  . [DISCONTINUED] azithromycin (ZITHROMAX) 250 MG tablet Take two tablets today , then take one tablet once daily for an additional four days  . [DISCONTINUED] benzonatate (TESSALON) 100 MG capsule Take 1 capsule (100 mg total) by mouth every 8 (eight) hours.  . [DISCONTINUED] clindamycin-benzoyl peroxide (BENZACLIN) gel Apply topically 2 (two) times daily.  . [DISCONTINUED] diclofenac (VOLTAREN) 75 MG EC tablet Take one tablet by mouth two times daily , as needed, for uncontrolled back pain  . [DISCONTINUED] potassium chloride (KLOR-CON 10) 10 MEQ tablet Take 1 tablet (10 mEq total) by mouth daily.    ROS:  Review of Systems  Constitutional: Negative.   HENT: Negative.   Eyes: Negative.   Respiratory: Negative.     Cardiovascular: Negative.   Gastrointestinal: Negative.   Genitourinary: Negative.   Musculoskeletal: Positive for back pain and neck pain.  Skin: Negative.   Neurological: Negative.   Endo/Heme/Allergies: Negative.   Psychiatric/Behavioral: The patient has insomnia.   All other systems reviewed and are negative.    Objective:   Today's Vitals: BP (!) 142/84 (BP Location: Right Arm, Patient Position: Sitting, Cuff Size: Normal)   Pulse 68   Temp (!) 97.4 F (36.3 C) (Temporal)   Ht 5\' 1"  (1.549 m)   Wt 194 lb (88 kg)   SpO2 94%   BMI 36.66 kg/m  Vitals with BMI 06/18/2019 01/21/2019 01/15/2019  Height 5\' 1"  5\' 1"  -  Weight 194 lbs 205 lbs -  BMI 21.22 48.25 -  Systolic 003 704 888  Diastolic 84 74 74  Pulse 68 - 74  Some encounter information is confidential and restricted. Go to Review Flowsheets activity to see all data.     Physical Exam Vitals and nursing note reviewed.  Constitutional:      Appearance: Normal appearance. She is obese.  HENT:     Head: Normocephalic and atraumatic.     Right Ear: External ear normal.     Left Ear: External ear normal.     Mouth/Throat:     Comments: Mask in place  Eyes:     General:        Right eye: No discharge.        Left eye: No discharge.     Conjunctiva/sclera: Conjunctivae normal.  Cardiovascular:     Rate and Rhythm: Normal rate and regular rhythm.     Pulses: Normal pulses.     Heart sounds: Normal heart sounds.  Pulmonary:     Effort: Pulmonary effort is normal.     Breath sounds: Normal breath sounds.  Musculoskeletal:     Cervical back: Normal, normal range of motion and neck supple. Normal range of motion.     Lumbar back: Tenderness present. Decreased range of motion.  Skin:    General: Skin is warm.  Neurological:     General: No focal deficit present.     Mental Status: She is alert and oriented to person, place, and time.  Psychiatric:        Mood and Affect: Mood normal.        Behavior: Behavior  normal.        Thought Content: Thought content normal.        Judgment: Judgment normal.      Assessment   1. Spondylolisthesis of lumbar region   2. Other insomnia   3. Trapezius muscle spasm     Tests ordered No orders of the  defined types were placed in this encounter.    Plan: Please see assessment and plan per problem list above.   Meds ordered this encounter  Medications  . methylPREDNISolone acetate (DEPO-MEDROL) injection 80 mg  . ketorolac (TORADOL) injection 60 mg    Patient to follow-up in 3 months   Perlie Mayo, NP

## 2019-06-18 NOTE — Patient Instructions (Signed)
I appreciate the opportunity to provide you with care for your health and wellness. Today we discussed: insomnia, back pain, and neck pain  Follow up: 3 months   No labs or referrals today  Pain injections today to help with back.  Use phone headset at work  Yoga is a good focus for bedtime Melatonin 1-3mg  1 hour before bedtime. Walk 20-30 mins daily  Please continue to practice social distancing to keep you, your family, and our community safe.  If you must go out, please wear a mask and practice good handwashing.  It was a pleasure to see you and I look forward to continuing to work together on your health and well-being. Please do not hesitate to call the office if you need care or have questions about your care.  Have a wonderful day and week. With Gratitude, Cherly Beach, DNP, AGNP-BC  Teens need about 9 hours of sleep a night. Younger children need more sleep (10-11 hours a night) and adults need slightly less (7-9 hours each night). 11 Tips to Follow: 1. No caffeine after 3pm: Avoid beverages with caffeine (soda, tea, energy drinks, etc.) especially after 3pm.  2. Dont go to bed hungry: Have your evening meal at least 3 hrs. before going to sleep. Its fine to have a small bedtime snack such as a glass of milk and a few crackers but dont have a big meal.  3. Have a nightly routine before bed: Plan on winding down before you go to sleep. Begin relaxing about 1 hour before you go to bed. Try doing a quiet activity such as listening to calming music, reading a book or meditating.  4. Turn off the TV and ALL electronics including video games, tablets, laptops, etc. At least 1 hour if not longer before sleep, and keep them out of the bedroom.  5. Turn off your cell phone and all notifications (new email and text alerts) or even better, leave your phone outside your room while you sleep. Studies have shown that a part of your brain continues to respond to certain lights and  sounds even while youre still asleep.  6. Make your bedroom quiet, dark and cool. If you cant control the noise, try wearing earplugs or using a fan to block out other sounds.  7. Practice relaxation techniques. Try reading a book or meditating or drain your brain by writing a list of what you need to do the next day.  8. Dont nap unless you feel sick: youll have a better nights sleep.  9. Dont smoke, or quit if you do. Nicotine, alcohol, and marijuana can all keep you awake. Talk to your health care provider if you need help with substance use.  10. Most importantly, wake up at the same time every day (or within 1 hour of your usual wake up time) EVEN on the weekends. A regular wake up time promotes sleep hygiene and prevents sleep problems.  11. Reduce exposure to bright light in the last three hours of the day before going to sleep.  Maintaining good sleep hygiene and having good sleep habits lower your risk of developing sleep problems. Getting better sleep can also improve your concentration and alertness. Try the simple steps in this guide. If you still have trouble getting enough rest, make an appointment with your health care provider.

## 2019-06-20 NOTE — Assessment & Plan Note (Signed)
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  Encouraged her to focus on ergonomics while she is working.

## 2019-06-20 NOTE — Assessment & Plan Note (Signed)
Back pain today provided with injections in the office of Toradol and Depo-Medrol.

## 2019-06-20 NOTE — Assessment & Plan Note (Signed)
Provided with sleep checklist.  Encouraged her to use melatonin over-the-counter.  Encouraged her to start doing daily exercises.  Encouraged her to start a nighttime routine.  And to avoid her phone and or TV an hour before bed.

## 2019-07-10 ENCOUNTER — Other Ambulatory Visit: Payer: Self-pay | Admitting: Family Medicine

## 2019-07-15 ENCOUNTER — Encounter: Payer: Self-pay | Admitting: Family Medicine

## 2019-07-17 ENCOUNTER — Other Ambulatory Visit: Payer: Self-pay | Admitting: Family Medicine

## 2019-07-17 ENCOUNTER — Encounter: Payer: Self-pay | Admitting: Family Medicine

## 2019-07-17 MED ORDER — ALPRAZOLAM 1 MG PO TABS: 1.0000 mg | ORAL_TABLET | Freq: Every day | ORAL | 3 refills | Status: DC

## 2019-07-17 NOTE — Telephone Encounter (Signed)
Patient is going out of town and said pharmacy has faxed for refills 3 times

## 2019-07-17 NOTE — Telephone Encounter (Signed)
Refill request sent for xanax.

## 2019-07-25 ENCOUNTER — Ambulatory Visit (INDEPENDENT_AMBULATORY_CARE_PROVIDER_SITE_OTHER): Payer: BC Managed Care – PPO | Admitting: Podiatry

## 2019-07-25 ENCOUNTER — Other Ambulatory Visit: Payer: Self-pay | Admitting: Podiatry

## 2019-07-25 ENCOUNTER — Other Ambulatory Visit: Payer: Self-pay

## 2019-07-25 ENCOUNTER — Ambulatory Visit (INDEPENDENT_AMBULATORY_CARE_PROVIDER_SITE_OTHER): Payer: BC Managed Care – PPO

## 2019-07-25 DIAGNOSIS — M79675 Pain in left toe(s): Secondary | ICD-10-CM | POA: Diagnosis not present

## 2019-07-25 DIAGNOSIS — G8929 Other chronic pain: Secondary | ICD-10-CM

## 2019-07-25 DIAGNOSIS — M205X2 Other deformities of toe(s) (acquired), left foot: Secondary | ICD-10-CM | POA: Diagnosis not present

## 2019-07-25 DIAGNOSIS — M2012 Hallux valgus (acquired), left foot: Secondary | ICD-10-CM

## 2019-07-25 DIAGNOSIS — M79672 Pain in left foot: Secondary | ICD-10-CM

## 2019-07-25 DIAGNOSIS — M7752 Other enthesopathy of left foot: Secondary | ICD-10-CM

## 2019-07-25 DIAGNOSIS — M545 Low back pain: Secondary | ICD-10-CM

## 2019-07-25 DIAGNOSIS — M21612 Bunion of left foot: Secondary | ICD-10-CM

## 2019-07-25 NOTE — Progress Notes (Signed)
  Subjective:  Patient ID: Stephanie Cannon, female    DOB: [birth year],  MRN: [id number]  Chief Complaint  Patient presents with  . Foot Pain    pt is here for left foot pain, possible bone spur of the left dorsal foot towards the left big toe, pt states that the pain has been going on for about 3-4 years. Pt states that the foot will often feel sore, especially when wearing certain shoes.   49 y.o. female presents with the above complaint. History confirmed with patient.   Objective:  Physical Exam: warm, good capillary refill, no trophic changes or ulcerative lesions, normal DP and PT pulses and normal sensory exam. Left Foot: POP left dorsal 1st Met with decreased joint ROM  Right Foot: normal exam, no swelling, tenderness, instability; ligaments intact, full range of motion of all ankle/foot joints   No images are attached to the encounter.  Radiographs: X-ray of the left foot: decreased joint space 1st MPJ with met primus elevatus, mild HAV Assessment:   1. Hallux limitus of left foot   2. Capsulitis of metatarsophalangeal (MTP) joint of left foot   3. Chronic toe pain, left foot   4. 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  Hallux Limitus with exostosis -XR reviewed with patient -Educated on etiology of deformity -Discussed proper shoe gear modifications and padding -Injection delivered to the painful bursa  Procedure: Joint Injection Location: Left 1st MPJ joint Skin Prep: Alcohol. Injectate: 0.5 cc 1% lidocaine plain, 0.5 cc dexamethasone phosphate. Disposition: Patient tolerated procedure well. Injection site dressed with a band-aid.   Return in about 3 weeks (around 08/15/2019) for Hallux limitus left, possible surgical planning visit.

## 2019-07-26 ENCOUNTER — Other Ambulatory Visit: Payer: Self-pay

## 2019-07-26 ENCOUNTER — Telehealth: Payer: Self-pay

## 2019-07-26 MED ORDER — CYCLOBENZAPRINE HCL 10 MG PO TABS: 10.0000 mg | ORAL_TABLET | Freq: Every day | ORAL | 0 refills | Status: DC

## 2019-07-26 NOTE — Telephone Encounter (Signed)
FMLA- Back pain  Copied Noted  Sleeved

## 2019-07-31 ENCOUNTER — Telehealth (INDEPENDENT_AMBULATORY_CARE_PROVIDER_SITE_OTHER): Payer: BC Managed Care – PPO | Admitting: Family Medicine

## 2019-07-31 ENCOUNTER — Encounter: Payer: Self-pay | Admitting: Family Medicine

## 2019-07-31 ENCOUNTER — Other Ambulatory Visit: Payer: Self-pay

## 2019-07-31 VITALS — BP 142/84 | Ht 62.0 in | Wt 194.0 lb

## 2019-07-31 DIAGNOSIS — J3489 Other specified disorders of nose and nasal sinuses: Secondary | ICD-10-CM

## 2019-07-31 DIAGNOSIS — R059 Cough, unspecified: Secondary | ICD-10-CM

## 2019-07-31 DIAGNOSIS — R05 Cough: Secondary | ICD-10-CM

## 2019-07-31 DIAGNOSIS — J40 Bronchitis, not specified as acute or chronic: Secondary | ICD-10-CM

## 2019-07-31 DIAGNOSIS — R062 Wheezing: Secondary | ICD-10-CM | POA: Insufficient documentation

## 2019-07-31 MED ORDER — PROMETHAZINE-DM 6.25-15 MG/5ML PO SYRP: 2.5000 mL | ORAL_SOLUTION | Freq: Three times a day (TID) | ORAL | 0 refills | Status: DC | PRN

## 2019-07-31 MED ORDER — AZITHROMYCIN 250 MG PO TABS: ORAL_TABLET | ORAL | 0 refills | Status: DC

## 2019-07-31 MED ORDER — PREDNISONE 10 MG (21) PO TBPK: ORAL_TABLET | ORAL | 0 refills | Status: DC

## 2019-07-31 MED ORDER — GUAIFENESIN ER 600 MG PO TB12: 600.0000 mg | ORAL_TABLET | Freq: Two times a day (BID) | ORAL | 0 refills | Status: AC

## 2019-07-31 MED ORDER — CROMOLYN SODIUM 5.2 MG/ACT NA AERS: 1.0000 | INHALATION_SPRAY | Freq: Four times a day (QID) | NASAL | 1 refills | Status: DC

## 2019-07-31 NOTE — Assessment & Plan Note (Signed)
Cromolyn nasal spray.  Encouraged hydration to help with mucus expectoring

## 2019-07-31 NOTE — Assessment & Plan Note (Signed)
Z-Pak, Promethazine DM, nasal cromolyn, Mucinex, prednisone Dosepak.  Advised to call back in 2 days if not starting to feel better.  Will consider chest x-ray at that time.  Strongly suggest her being out of work but she would like to hold off as she just came off a vacation.  If not feeling better will provide work note.

## 2019-07-31 NOTE — Patient Instructions (Signed)
I appreciate the opportunity to provide you with care for your health and wellness. Today we discussed:   Follow up:   No labs or referrals today  Please continue to practice social distancing to keep you, your family, and our community safe.  If you must go out, please wear a mask and practice good handwashing.  It was a pleasure to see you and I look forward to continuing to work together on your health and well-being. Please do not hesitate to call the office if you need care or have questions about your care.  Have a wonderful day and week. With Gratitude, Kathia Covington, DNP, AGNP-BC      

## 2019-07-31 NOTE — Assessment & Plan Note (Signed)
Prednisone Dosepak, continue the use of the inhaler as directed.

## 2019-07-31 NOTE — Assessment & Plan Note (Signed)
Mucinex, Zithromax, Promethazine DM provided.

## 2019-07-31 NOTE — Progress Notes (Signed)
Virtual Visit via Telephone Note   This visit type was conducted due to national recommendations for restrictions regarding the COVID-19 Pandemic (e.g. social distancing) in an effort to limit this patient's exposure and mitigate transmission in our community.  Due to her co-morbid illnesses, this patient is at least at moderate risk for complications without adequate follow up.  This format is felt to be most appropriate for this patient at this time.  The patient did not have access to video technology/had technical difficulties with video requiring transitioning to audio format only (telephone).  All issues noted in this document were discussed and addressed.  No physical exam could be performed with this format.   Evaluation Performed:  Follow-up visit  Date:  07/31/2019   ID:  Stephanie Cannon, DOB [birth year], MRN [id number]  Patient Location: Home Provider Location: Office/Clinic  Location of Patient: Home Location of Provider: Telehealth Consent was obtain for visit to be over via telehealth. I verified that I am speaking with the correct person using two identifiers.  PCP:  Fayrene Helper, MD   Chief Complaint:  Wheeze and cough   History of Present Illness:    Stephanie Cannon is a 49 y.o. female with history of bronchitis, asthma, obesity.  Presents today after having traveled last week to [place].  Had onset Monday evening of a cough which is progressively worsened over the last several days.  Usually if she comes in contact with a cold it flares up her asthma gets her bronchitis.  She has tried some over-the-counter treatments without any success.  Unsure if she has a fever but does not feel like she is feverish or has chills.  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  However she did travel to [facility] for a week and might of gotten something during that time.  She has a cough, congestion/stuffy runny nose, dry cough no production but she feels  like there could be something there if she could just cough it up.  Rib cage and back are sore from excessive coughing.  She denies having any chest pain.  Itchy watery eyes.  Sore throat.  Or muscle aches or pains.  No headache.  The patient does not have symptoms concerning for COVID-19 infection (fever, chills, cough, or new shortness of breath).   Past Medical, Surgical, Social History, Allergies, and Medications have been Reviewed.  Past Medical History:  Diagnosis Date  . Arthritis   . Asthma    Phreesia 07/31/2019  . Breast discharge    left spont white discharge x 6 months  . Carpal tunnel syndrome    left, wears brace at night  . Chronic back pain   . GERD (gastroesophageal reflux disease)   . Hyperlipidemia   . Hypertension   . Obesity    Past Surgical History:  Procedure Laterality Date  . ABDOMINAL HYSTERECTOMY  2003   partial  . BREAST BIOPSY Left 2018   benign  . CARPAL TUNNEL RELEASE Right   . CESAREAN SECTION  1988  . CESAREAN SECTION N/A    Phreesia 07/31/2019  . CHOLECYSTECTOMY    . COLONOSCOPY N/A 09/21/2012   Procedure: COLONOSCOPY;  Surgeon: Danie Binder, MD;  Location: [facility];  Service: Endoscopy;  Laterality: N/A;  9:30  . LAPAROSCOPIC GASTRIC SLEEVE RESECTION WITH HIATAL HERNIA REPAIR N/A 06/23/2014   Procedure: LAPAROSCOPIC LYSIS OF ADHESIONS, GASTRIC SLEEVE RESECTION AND UPPER ENDO;  Surgeon: Alphonsa Overall, MD;  Location:  [facility];  Service: General;  Laterality: N/A;  . LAPAROSCOPIC LYSIS OF ADHESIONS  06/23/2014   Procedure: LAPAROSCOPIC LYSIS OF ADHESIONS;  Surgeon: Alphonsa Overall, MD;  Location: [facility];  Service: General;;  . LUMBAR FUSION  01/22/2014   L4 L5       DR Arnoldo Morale  . other     Vocal chord polyp removal  . 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 SPINE SURGERY N/A    [employer]reesia 07/31/2019  . TONSILLECTOMY    . UPPER GI ENDOSCOPY  06/23/2014   Procedure: UPPER GI ENDOSCOPY;  Surgeon: Alphonsa Overall, MD;  Location: [facility];  Service:  General;;     Current Meds  Medication Sig  . albuterol (VENTOLIN HFA) 108 (90 Base) MCG/ACT inhaler Inhale 2 puffs into the lungs every 6 (six) hours as needed for wheezing or shortness of breath.  . ALPRAZolam (XANAX) 1 MG tablet Take 1 tablet (1 mg total) by mouth at bedtime.  . cyclobenzaprine (FLEXERIL) 10 MG tablet Take 1 tablet (10 mg total) by mouth at bedtime.  Marland Kitchen esomeprazole (NEXIUM) 40 MG capsule TAKE ONE CAPSULE BY MOUTH ONCE DAILY.  Marland Kitchen FLUoxetine (PROZAC) 20 MG tablet Take 1 tablet (20 mg total) by mouth daily.  . meclizine (ANTIVERT) 25 MG tablet Take 1 tablet (25 mg total) by mouth 3 (three) times daily as needed for dizziness.  . Multiple Vitamins-Minerals (MULTIVITAMIN GUMMIES ADULT PO) Take 1 each by mouth 2 (two) times daily.  . ondansetron (ZOFRAN ODT) 4 MG disintegrating tablet Take 1 tablet (4 mg total) by mouth every 8 (eight) hours as needed for nausea or vomiting.  Marland Kitchen SAXENDA 18 MG/3ML SOPN Inject 3 mg into the skin daily.   Marland Kitchen triamterene-hydrochlorothiazide (MAXZIDE) 75-50 MG tablet TAKE 1 TABLET BY MOUTH ONCE DAILY.  . valACYclovir (VALTREX) 1000 MG tablet SMARTSIG:1 Tablet(s) By Mouth Every 12 Hours     Allergies:   Codeine and Morphine   ROS:   Please see the history of present illness.    All other systems reviewed and are negative.   Labs/Other Tests and Data Reviewed:    Recent Labs: 01/15/2019: ALT 19; BUN 13; Creatinine, Ser 0.74; Hemoglobin 15.3; Platelets 270; Potassium 2.9; Sodium 138   Recent Lipid Panel Lab Results  Component Value Date/Time   CHOL 213 (H) 07/10/2018 12:41 PM   TRIG 108 07/10/2018 12:41 PM   HDL 59 07/10/2018 12:41 PM   CHOLHDL 3.6 07/10/2018 12:41 PM   LDLCALC 132 (H) 07/10/2018 12:41 PM    Wt Readings from Last 3 Encounters:  07/31/19 194 lb (88 kg)  06/18/19 194 lb (88 kg)  01/21/19 205 lb (93 kg)     Objective:    Vital Signs:  BP (!) 142/84   Ht 5\' 2"  (1.575 m)   Wt 194 lb (88 kg)   BMI 35.48 kg/m    VITAL  SIGNS:  reviewed GEN:  alert and oriented  RESPIRATORY:  mild shortness of breath in conversation, cough, hoarseness  PSYCH:  normal affect and mood   ASSESSMENT & PLAN:    1. Bronchitis - promethazine-dextromethorphan (PROMETHAZINE-DM) 6.25-15 MG/5ML syrup; Take 2.5 mLs by mouth 3 (three) times daily as needed for cough.  Dispense: 118 mL; Refill: 0 - cromolyn (NASALCROM) 5.2 MG/ACT nasal spray; Place 1 spray into both nostrils 4 (four) times daily.  Dispense: 13 mL; Refill: 1 - azithromycin (ZITHROMAX) 250 MG tablet; Take 2 tablets (500 mg) on the first day. Then take  1 tablet (250mg ) on days 2-5.  Dispense: 6 tablet; Refill: 0 - guaiFENesin (MUCINEX) 600 MG 12 hr tablet; Take 1 tablet (600 mg total) by mouth 2 (two) times daily for 15 days.  Dispense: 30 tablet; Refill: 0 - predniSONE (STERAPRED UNI-PAK 21 TAB) 10 MG (21) TBPK tablet; Take a directed  Dispense: 21 tablet; Refill: 0  2. Wheeze  - predniSONE (STERAPRED UNI-PAK 21 TAB) 10 MG (21) TBPK tablet; Take a directed  Dispense: 21 tablet; Refill: 0  3. Cough  - promethazine-dextromethorphan (PROMETHAZINE-DM) 6.25-15 MG/5ML syrup; Take 2.5 mLs by mouth 3 (three) times daily as needed for cough.  Dispense: 118 mL; Refill: 0 - guaiFENesin (MUCINEX) 600 MG 12 hr tablet; Take 1 tablet (600 mg total) by mouth 2 (two) times daily for 15 days.  Dispense: 30 tablet; Refill: 0  4. Stuffy and runny nose  - cromolyn (NASALCROM) 5.2 MG/ACT nasal spray; Place 1 spray into both nostrils 4 (four) times daily.  Dispense: 13 mL; Refill: 1   Time:   Today, I have spent 10 minutes with the patient with telehealth technology discussing the above problems.     Medication Adjustments/Labs and Tests Ordered: Current medicines are reviewed at length with the patient today.  Concerns regarding medicines are outlined above.   Tests Ordered: No orders of the defined types were placed in this encounter.   Medication Changes: No orders of the  defined types were placed in this encounter.   Disposition:  Follow up 09/24/2019  Signed, Perlie Mayo, NP  07/31/2019 2:04 PM     [facility]

## 2019-08-02 ENCOUNTER — Encounter: Payer: Self-pay | Admitting: Family Medicine

## 2019-08-04 ENCOUNTER — Ambulatory Visit (INDEPENDENT_AMBULATORY_CARE_PROVIDER_SITE_OTHER): Payer: BC Managed Care – PPO

## 2019-08-04 ENCOUNTER — Ambulatory Visit
Admission: EM | Admit: 2019-08-04 | Discharge: 2019-08-04 | Disposition: A | Discharge status: Home / Self Care | Payer: BC Managed Care – PPO | Attending: Family Medicine | Admitting: Family Medicine

## 2019-08-04 ENCOUNTER — Encounter: Payer: Self-pay | Admitting: Emergency Medicine

## 2019-08-04 ENCOUNTER — Other Ambulatory Visit: Payer: Self-pay

## 2019-08-04 DIAGNOSIS — J209 Acute bronchitis, unspecified: Secondary | ICD-10-CM

## 2019-08-04 DIAGNOSIS — R05 Cough: Secondary | ICD-10-CM

## 2019-08-04 DIAGNOSIS — R079 Chest pain, unspecified: Secondary | ICD-10-CM

## 2019-08-04 DIAGNOSIS — R0602 Shortness of breath: Secondary | ICD-10-CM | POA: Diagnosis not present

## 2019-08-04 DIAGNOSIS — R059 Cough, unspecified: Secondary | ICD-10-CM

## 2019-08-04 DIAGNOSIS — J069 Acute upper respiratory infection, unspecified: Secondary | ICD-10-CM

## 2019-08-04 MED ORDER — BENZONATATE 100 MG PO CAPS
100.0000 mg | ORAL_CAPSULE | Freq: Three times a day (TID) | ORAL | 0 refills | Status: DC
Start: 2019-08-04 — End: 2019-09-23

## 2019-08-04 MED ORDER — AMOXICILLIN-POT CLAVULANATE 875-125 MG PO TABS
1.0000 | ORAL_TABLET | Freq: Two times a day (BID) | ORAL | 0 refills | Status: DC
Start: 2019-08-04 — End: 2019-08-04

## 2019-08-04 MED ORDER — FLUTICASONE-SALMETEROL 250-50 MCG/DOSE IN AEPB
1.0000 | INHALATION_SPRAY | Freq: Every day | RESPIRATORY_TRACT | 0 refills | Status: DC
Start: 2019-08-04 — End: 2019-08-04

## 2019-08-04 MED ORDER — BENZONATATE 100 MG PO CAPS
100.0000 mg | ORAL_CAPSULE | Freq: Three times a day (TID) | ORAL | 0 refills | Status: DC
Start: 2019-08-04 — End: 2019-08-04

## 2019-08-04 MED ORDER — AMOXICILLIN-POT CLAVULANATE 875-125 MG PO TABS
1.0000 | ORAL_TABLET | Freq: Two times a day (BID) | ORAL | 0 refills | Status: DC
Start: 2019-08-04 — End: 2019-08-14

## 2019-08-04 MED ORDER — PREDNISONE 10 MG (21) PO TBPK
ORAL_TABLET | Freq: Every day | ORAL | 0 refills | Status: DC
Start: 2019-08-04 — End: 2019-08-20

## 2019-08-04 MED ORDER — FLUTICASONE-SALMETEROL 250-50 MCG/DOSE IN AEPB
1.0000 | INHALATION_SPRAY | Freq: Every day | RESPIRATORY_TRACT | 0 refills | Status: DC
Start: 2019-08-04 — End: 2019-11-18

## 2019-08-04 MED ORDER — PREDNISONE 10 MG (21) PO TBPK
ORAL_TABLET | Freq: Every day | ORAL | 0 refills | Status: DC
Start: 2019-08-04 — End: 2019-08-04

## 2019-08-04 NOTE — Discharge Instructions (Signed)
You have bronchitis  Your chest xray was negative today  I have sent in a steroid taper for you to take as well. Take 6 tablets for the first two days, take 5 tablets for day three and four, take 4 tablets for days five and six, take 3 tablets for days seven and eight, take 2 tablets for day nine and ten, then take 1 tablet for days eleven and twelve.  I have sent in an advair inhaler for you to use once daily  I have sent in Augmentin for you to take twice daily for 7 days  I have sent in tessalon perles for cough    Follow up with this office or with primary care if you are not improving over the next few days  Follow up with the ER for high fever, trouble swallowing, trouble breathing, other concerning symptoms

## 2019-08-04 NOTE — ED Triage Notes (Signed)
Pt started coughing and sneezing 07/26, covid tested by rapid and pcr, both were negative. Then her pcp rx'd her prednisone,abx , mucinex, ihaler and cough medicine. Pt reports she is still coughing and wheezing and has no taste or smell.

## 2019-08-04 NOTE — ED Provider Notes (Addendum)
[place]   [id number] 08/04/19 Arrival Time: 70   CC: COVID symptoms  SUBJECTIVE: History from: patient.  Stephanie Cannon is a 49 y.o. female who presents with abrupt onset of nasal congestion, PND, and persistent dry cough for the last . Denies sick exposure to COVID, flu or strep. Denies recent travel. Had televisit with PCP and was prescribed a z pack, steroids, inhaler and cough syrup. Reports that she is not better. There are no aggravating or alleviating factors. Denies previous symptoms in the past. Denies fever, chills, fatigue, sinus pain, rhinorrhea, sore throat, SOB, chest pain, nausea, changes in bowel or bladder habits.    ROS: As per HPI.  All other pertinent ROS negative.     Past Medical History:  Diagnosis Date  . Arthritis   . Asthma    Phreesia 07/31/2019  . Breast discharge    left spont white discharge x 6 months  . Carpal tunnel syndrome    left, wears brace at night  . Chronic back pain   . GERD (gastroesophageal reflux disease)   . Hyperlipidemia   . Hypertension   . Obesity    Past Surgical History:  Procedure Laterality Date  . ABDOMINAL HYSTERECTOMY  2003   partial  . BREAST BIOPSY Left 2018   benign  . CARPAL TUNNEL RELEASE Right   . CESAREAN SECTION  1988  . CESAREAN SECTION N/A    Phreesia 07/31/2019  . CHOLECYSTECTOMY    . COLONOSCOPY N/A 09/21/2012   Procedure: COLONOSCOPY;  Surgeon: Danie Binder, MD;  Location: [facility];  Service: Endoscopy;  Laterality: N/A;  9:30  . LAPAROSCOPIC GASTRIC SLEEVE RESECTION WITH HIATAL HERNIA REPAIR N/A 06/23/2014   Procedure: LAPAROSCOPIC LYSIS OF ADHESIONS, GASTRIC SLEEVE RESECTION AND UPPER ENDO;  Surgeon: Alphonsa Overall, MD;  Location: [facility];  Service: General;  Laterality: N/A;  . LAPAROSCOPIC LYSIS OF ADHESIONS  06/23/2014   Procedure: LAPAROSCOPIC LYSIS OF ADHESIONS;  Surgeon: Alphonsa Overall, MD;  Location: [facility];  Service: General;;  . LUMBAR FUSION  01/22/2014   L4 L5       DR  Arnoldo Morale  . other     Vocal chord polyp removal  . 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 SPINE SURGERY N/A    Phreesia 07/31/2019  . TONSILLECTOMY    . UPPER GI ENDOSCOPY  06/23/2014   Procedure: UPPER GI ENDOSCOPY;  Surgeon: Alphonsa Overall, MD;  Location: [facility];  Service: General;;   Allergies  Allergen Reactions  . Codeine Itching  . Morphine Itching   No current facility-administered medications on file prior to encounter.   Current Outpatient Medications on File Prior to Encounter  Medication Sig Dispense Refill  . albuterol (VENTOLIN HFA) 108 (90 Base) MCG/ACT inhaler Inhale 2 puffs into the lungs every 6 (six) hours as needed for wheezing or shortness of breath. 18 g 5  . ALPRAZolam (XANAX) 1 MG tablet Take 1 tablet (1 mg total) by mouth at bedtime. 30 tablet 3  . azithromycin (ZITHROMAX) 250 MG tablet Take 2 tablets (500 mg) on the first day. Then take 1 tablet (250mg ) on days 2-5. 6 tablet 0  . cromolyn (NASALCROM) 5.2 MG/ACT nasal spray Place 1 spray into both nostrils 4 (four) times daily. 13 mL 1  . cyclobenzaprine (FLEXERIL) 10 MG tablet Take 1 tablet (10 mg total) by mouth at bedtime. 90 tablet 0  . esomeprazole (NEXIUM) 40 MG capsule TAKE ONE CAPSULE BY MOUTH ONCE DAILY.  90 capsule 0  . FLUoxetine (PROZAC) 20 MG tablet Take 1 tablet (20 mg total) by mouth daily. 90 tablet 1  . guaiFENesin (MUCINEX) 600 MG 12 hr tablet Take 1 tablet (600 mg total) by mouth 2 (two) times daily for 15 days. 30 tablet 0  . meclizine (ANTIVERT) 25 MG tablet Take 1 tablet (25 mg total) by mouth 3 (three) times daily as needed for dizziness. 30 tablet 0  . Multiple Vitamins-Minerals (MULTIVITAMIN GUMMIES ADULT PO) Take 1 each by mouth 2 (two) times daily.    . ondansetron (ZOFRAN ODT) 4 MG disintegrating tablet Take 1 tablet (4 mg total) by mouth every 8 (eight) hours as needed for nausea or vomiting. 20 tablet 0  . potassium chloride SA (KLOR-CON) 20 MEQ tablet Take 1 tablet (20 mEq total) by  mouth 2 (two) times daily for 5 days. 10 tablet 0  . promethazine-dextromethorphan (PROMETHAZINE-DM) 6.25-15 MG/5ML syrup Take 2.5 mLs by mouth 3 (three) times daily as needed for cough. 118 mL 0  . SAXENDA 18 MG/3ML SOPN Inject 3 mg into the skin daily.     Marland Kitchen triamterene-hydrochlorothiazide (MAXZIDE) 75-50 MG tablet TAKE 1 TABLET BY MOUTH ONCE DAILY. 90 tablet 0  . valACYclovir (VALTREX) 1000 MG tablet SMARTSIG:1 Tablet(s) By Mouth Every 12 Hours     Social History   Socioeconomic History  . Marital status: Married    Spouse name: Not on file  . Number of children: Not on file  . Years of education: Not on file  . Highest education level: Not on file  Occupational History  . 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 Smoking status: Former Smoker    Types: Cigarettes  . Smokeless tobacco: Never Used  . Tobacco comment: quit smoking 2011  Vaping Use  . Vaping Use: Never used  Substance and Sexual Activity  . Alcohol use: Yes    Comment: occasionally  . Drug use: No  . Sexual activity: Not on file  Other Topics Concern  . Not on file  Social History Narrative  . Not on file   Social Determinants of Health   Financial Resource Strain:   . Difficulty of Paying Living Expenses:   Food Insecurity:   . Worried About Charity fundraiser in the Last Year:   . Arboriculturist in the Last Year:   Transportation Needs:   . Film/video editor (Medical):   Marland Kitchen Lack of Transportation (Non-Medical):   Physical Activity:   . Days of Exercise per Week:   . Minutes of Exercise per Session:   Stress:   . Feeling of Stress :   Social Connections:   . Frequency of Communication with Friends and Family:   . Frequency of Social Gatherings with Friends and Family:   . Attends Religious Services:   . Active Member of Clubs or Organizations:   . Attends Archivist Meetings:   Marland Kitchen Marital Status:   Intimate Partner Violence:    . Fear of Current or Ex-Partner:   . Emotionally Abused:   Marland Kitchen Physically Abused:   . Sexually Abused:    Family History  Problem Relation Age of Onset  . Hypertension Mother   . Hypertension Father   . Hypertension Brother   . Cancer Maternal Aunt   . Cancer Maternal Uncle        liver  . Cancer Paternal Uncle  bone cancer  . Diabetes Maternal Grandmother   . Colon cancer Neg Hx     OBJECTIVE:  Vitals:   08/04/19 1234 08/04/19 1235  BP:  (!) 152/85  Pulse:  72  Resp:  17  Temp:  98.4 F (36.9 C)  TempSrc:  Oral  SpO2:  95%  Weight: 191 lb (86.6 kg)   Height: 5\' 1"  (1.549 m)      General appearance: alert; appears fatigued, but nontoxic; speaking in full sentences and tolerating own secretions HEENT: NCAT; Ears: EACs clear, TMs pearly gray; Eyes: PERRL.  EOM grossly intact. Sinuses: nontender; Nose: nares patent without rhinorrhea, Throat: oropharynx clear, tonsils non erythematous or enlarged, uvula midline  Neck: supple without LAD Lungs: unlabored respirations, symmetrical air entry; cough: moderate; no respiratory distress; diminished lung sounds in bilateral lung bases, diffuse wheezing throughout bilateral lung fields Heart: regular rate and rhythm.  Radial pulses 2+ symmetrical bilaterally Skin: warm and dry Psychological: alert and cooperative; normal mood and affect  LABS:  No results found for this or any previous visit (from the past 24 hour(s)).   ASSESSMENT & PLAN:  1. Cough   2. Acute bronchitis, unspecified organism   3. Upper respiratory tract infection, unspecified type     Meds ordered this encounter  Medications  . DISCONTD: predniSONE (STERAPRED UNI-PAK 21 TAB) 10 MG (21) TBPK tablet    Sig: Take by mouth daily. Take 6 tabs by mouth daily  for 2 days, then 5 tabs for 2 days, then 4 tabs for 2 days, then 3 tabs for 2 days, 2 tabs for 2 days, then 1 tab by mouth daily for 2 days    Dispense:  42 tablet    Refill:  0    Order Specific  Question:   Supervising Provider    Answer:   Chase Picket [id number]  . DISCONTD: amoxicillin-clavulanate (AUGMENTIN) 875-125 MG tablet    Sig: Take 1 tablet by mouth 2 (two) times daily for 10 days.    Dispense:  20 tablet    Refill:  0    Order Specific Question:   Supervising Provider    Answer:   Chase Picket [id number]  . DISCONTD: Fluticasone-Salmeterol (ADVAIR DISKUS) 250-50 MCG/DOSE AEPB    Sig: Inhale 1 puff into the lungs daily.    Dispense:  14 each    Refill:  0    Order Specific Question:   Supervising Provider    Answer:   Chase Picket [id number]  . DISCONTD: benzonatate (TESSALON) 100 MG capsule    Sig: Take 1 capsule (100 mg total) by mouth every 8 (eight) hours.    Dispense:  21 capsule    Refill:  0    Order Specific Question:   Supervising Provider    Answer:   Chase Picket [id number]  . amoxicillin-clavulanate (AUGMENTIN) 875-125 MG tablet    Sig: Take 1 tablet by mouth 2 (two) times daily for 10 days.    Dispense:  20 tablet    Refill:  0    Order Specific Question:   Supervising Provider    Answer:   Chase Picket [id number]  . benzonatate (TESSALON) 100 MG capsule    Sig: Take 1 capsule (100 mg total) by mouth every 8 (eight) hours.    Dispense:  21 capsule    Refill:  0    Order Specific Question:   Supervising Provider    Answer:   Chase Picket [id number]  .  Fluticasone-Salmeterol (ADVAIR DISKUS) 250-50 MCG/DOSE AEPB    Sig: Inhale 1 puff into the lungs daily.    Dispense:  14 each    Refill:  0    Order Specific Question:   Supervising Provider    Answer:   Chase Picket [id number]  . predniSONE (STERAPRED UNI-PAK 21 TAB) 10 MG (21) TBPK tablet    Sig: Take by mouth daily. Take 6 tabs by mouth daily  for 2 days, then 5 tabs for 2 days, then 4 tabs for 2 days, then 3 tabs for 2 days, 2 tabs for 2 days, then 1 tab by mouth daily for 2 days    Dispense:  42 tablet    Refill:  0    Order Specific Question:   Supervising  Provider    Answer:   Chase Picket [[id number]]    URI with Bronchitis Prescribed Augmentin Prescribed steroid taper Prescribed Advair Prescribed tessalon perles Chest xray negative COVID testing ordered.  It will take between 1-2 days for test results.  Someone will contact you regarding abnormal results.    Patient should remain in quarantine until they have received Covid results.  If negative you may resume normal activities (go back to work/school) while practicing hand hygiene, social distance, and mask wearing.  If positive, patient should remain in quarantine for 10 days from symptom onset AND greater than 72 hours after symptoms resolution (absence of fever without the use of fever-reducing medication and improvement in respiratory symptoms), whichever is longer Get plenty of rest and push fluids Use OTC zyrtec for nasal congestion, runny nose, and/or sore throat Use OTC flonase for nasal congestion and runny nose Use medications daily for symptom relief Use OTC medications like ibuprofen or tylenol as needed fever or pain Call or go to the ED if you have any new or worsening symptoms such as fever, worsening cough, shortness of breath, chest tightness, chest pain, turning blue, changes in mental status.  Reviewed expectations re: course of current medical issues. Questions answered. Outlined signs and symptoms indicating need for more acute intervention. Patient verbalized understanding. After Visit Summary given.         Faustino Congress, NP 08/04/19 1617    Faustino Congress, NP 08/04/19 [phone]

## 2019-08-05 ENCOUNTER — Encounter: Payer: Self-pay | Admitting: Family Medicine

## 2019-08-06 ENCOUNTER — Encounter: Payer: Self-pay | Admitting: Family Medicine

## 2019-08-06 LAB — NOVEL CORONAVIRUS, NAA: SARS-CoV-2, NAA: NOT DETECTED

## 2019-08-06 LAB — SARS-COV-2, NAA 2 DAY TAT

## 2019-08-10 ENCOUNTER — Encounter: Payer: Self-pay | Admitting: Family Medicine

## 2019-08-11 ENCOUNTER — Encounter: Payer: Self-pay | Admitting: Family Medicine

## 2019-08-11 ENCOUNTER — Other Ambulatory Visit: Payer: Self-pay | Admitting: Family Medicine

## 2019-08-12 ENCOUNTER — Other Ambulatory Visit: Payer: Self-pay | Admitting: Family Medicine

## 2019-08-12 DIAGNOSIS — R059 Cough, unspecified: Secondary | ICD-10-CM

## 2019-08-12 DIAGNOSIS — R062 Wheezing: Secondary | ICD-10-CM

## 2019-08-12 NOTE — Progress Notes (Signed)
amb pulm  

## 2019-08-14 ENCOUNTER — Encounter: Payer: Self-pay | Admitting: Pulmonary Disease

## 2019-08-14 ENCOUNTER — Other Ambulatory Visit: Payer: Self-pay

## 2019-08-14 ENCOUNTER — Ambulatory Visit (INDEPENDENT_AMBULATORY_CARE_PROVIDER_SITE_OTHER): Payer: BC Managed Care – PPO | Admitting: Pulmonary Disease

## 2019-08-14 VITALS — BP 122/68 | HR 77 | Temp 97.0°F | Ht 61.0 in | Wt 204.4 lb

## 2019-08-14 DIAGNOSIS — R05 Cough: Secondary | ICD-10-CM | POA: Diagnosis not present

## 2019-08-14 DIAGNOSIS — R0982 Postnasal drip: Secondary | ICD-10-CM

## 2019-08-14 DIAGNOSIS — J4521 Mild intermittent asthma with (acute) exacerbation: Secondary | ICD-10-CM | POA: Diagnosis not present

## 2019-08-14 DIAGNOSIS — R059 Cough, unspecified: Secondary | ICD-10-CM

## 2019-08-14 MED ORDER — ALBUTEROL SULFATE (2.5 MG/3ML) 0.083% IN NEBU
2.5000 mg | INHALATION_SOLUTION | Freq: Four times a day (QID) | RESPIRATORY_TRACT | 3 refills | Status: DC | PRN
Start: 2019-08-14 — End: 2019-11-18

## 2019-08-14 MED ORDER — FLUTICASONE PROPIONATE 50 MCG/ACT NA SUSP
1.0000 | Freq: Every day | NASAL | 2 refills | Status: DC
Start: 2019-08-14 — End: 2019-11-18

## 2019-08-14 MED ORDER — IPRATROPIUM BROMIDE 0.03 % NA SOLN: 2.0000 | Freq: Three times a day (TID) | NASAL | 12 refills | Status: DC | PRN

## 2019-08-14 NOTE — Patient Instructions (Addendum)
Continue advair or wixela 1 puff twice daily (morning and evening), rinse mouth out with water after Use albuterol inhaler as needed, 2 puffs every 4-6 hours Start fluticasone nasal spray 1 spray per nostril daily Start ipratropium nasal spray 1 spray per nostril 3 times daily for the first 3 days, then as needed up to 3 times per day We will order a nebulizer machine and albuterol solution to use as needed   Follow up in 3 months

## 2019-08-14 NOTE — Progress Notes (Signed)
Patient ID: Stephanie Cannon, female    DOB: [birth year], 49 y.o.   MRN: [id number]  Chief Complaint  Patient presents with   Consult    referred by PCP for recurrent bronchitis.  treated X3 in the past year.  c/o sob with exertion, prod cough with yellow mucus.      Referring provider: Fayrene Helper, MD  HPI: Stephanie Cannon is a 49 year old woman with history of GERD, hypertension, hyperlipidemia and obesity who is referred to pulmonary clinic for evaluation of cough, wheezing and shortness of breath.   About 4 weeks ago she developed nasal congestion, cough and wheezing. She has been having intermittent shortness of breath related to these symptoms. She has had episodes of bronchitis about a year ago and then more recently in January. She reports those episodes were not as severe as the symptoms are now. She does not report prior issues with seasonal allergies or having cough, chest tightness or wheezing with exposure to smoke, cold air or strong perfumes. She feels the hot/humid weather makes her breathing worse.   She has been prescribe 3 courses of prednisone and 2 courses of antibiotics with slow improvement in her cough and wheezing. She finishes a 10 day prednisone taper tomorrow. She has been prescribed advair diskus 250-29mcg 1 puff daily and albuterol inhaler as needed. She has also been taking mucinex and coricidin as needed for nasal congestion and runny nose. She does report post-nasal drip.   She is a non-smoker. Her husband smokes, but he does not smoke in the house or in the car, only outside. Her mother has history of asthma and has been using her nebulizer machine as needed with albuterol solution over the past month with relief. 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  Chest radiograph on 08/04/2019 does not show opacities or pleural effusions and compared to chest radiograph on 01/22/19 there is no change, as both films are unremarkable.  No PFTs on  file.  Allergies  Allergen Reactions   Codeine Itching   Morphine Itching    Immunization History  Administered Date(s) Administered   Influenza Split 10/30/2013, 09/19/2018   Influenza,inj,Quad PF,6+ Mos 09/22/2014, 09/14/2015, 09/12/2017   Moderna SARS-COVID-2 Vaccination 03/14/2019, 04/16/2019   PPD Test 12/01/2014   Td 06/30/2003   Tdap 07/18/2013    Past Medical History:  Diagnosis Date   Arthritis    Asthma    Phreesia 07/31/2019   Breast discharge    left spont white discharge x 6 months   Carpal tunnel syndrome    left, wears brace at night   Chronic back pain    GERD (gastroesophageal reflux disease)    Hyperlipidemia    Hypertension    Obesity     Tobacco History: Social History   Tobacco Use  Smoking Status Former Smoker   Packs/day: 0.75   Years: 17.00   Pack years: 12.75   Types: Cigarettes   Quit date: 01/03/2009   Years since quitting: 10.6  Smokeless Tobacco Never Used   Counseling given: Not Answered   Outpatient Medications Prior to Visit  Medication Sig Dispense Refill   albuterol (VENTOLIN HFA) 108 (90 Base) MCG/ACT inhaler Inhale 2 puffs into the lungs every 6 (six) hours as needed for wheezing or shortness of breath. 18 g 5   ALPRAZolam (XANAX) 1 MG tablet Take 1 tablet (1 mg total) by mouth at bedtime. 30 tablet 3   benzonatate (TESSALON) 100 MG capsule Take 1  capsule (100 mg total) by mouth every 8 (eight) hours. 21 capsule 0   cyclobenzaprine (FLEXERIL) 10 MG tablet Take 1 tablet (10 mg total) by mouth at bedtime. 90 tablet 0   esomeprazole (NEXIUM) 40 MG capsule TAKE ONE CAPSULE BY MOUTH ONCE DAILY. 90 capsule 0   FLUoxetine (PROZAC) 20 MG tablet Take 1 tablet (20 mg total) by mouth daily. 90 tablet 1   Fluticasone-Salmeterol (ADVAIR DISKUS) 250-50 MCG/DOSE AEPB Inhale 1 puff into the lungs daily. 14 each 0   guaiFENesin (MUCINEX) 600 MG 12 hr tablet Take 1 tablet (600 mg total) by mouth 2 (two) times  daily for 15 days. 30 tablet 0   Multiple Vitamins-Minerals (HAIR SKIN & NAILS ADVANCED) TABS Take 1 tablet by mouth daily.     Multiple Vitamins-Minerals (MULTIVITAMIN GUMMIES ADULT PO) Take 1 each by mouth 2 (two) times daily.     ondansetron (ZOFRAN ODT) 4 MG disintegrating tablet Take 1 tablet (4 mg total) by mouth every 8 (eight) hours as needed for nausea or vomiting. 20 tablet 0   potassium chloride SA (KLOR-CON) 20 MEQ tablet Take 1 tablet (20 mEq total) by mouth 2 (two) times daily for 5 days. 10 tablet 0   predniSONE (STERAPRED UNI-PAK 21 TAB) 10 MG (21) TBPK tablet Take by mouth daily. Take 6 tabs by mouth daily  for 2 days, then 5 tabs for 2 days, then 4 tabs for 2 days, then 3 tabs for 2 days, 2 tabs for 2 days, then 1 tab by mouth daily for 2 days 42 tablet 0   promethazine-dextromethorphan (PROMETHAZINE-DM) 6.25-15 MG/5ML syrup Take 2.5 mLs by mouth 3 (three) times daily as needed for cough. 118 mL 0   triamterene-hydrochlorothiazide (MAXZIDE) 75-50 MG tablet TAKE 1 TABLET BY MOUTH ONCE DAILY. 90 tablet 0   valACYclovir (VALTREX) 1000 MG tablet SMARTSIG:1 Tablet(s) By Mouth Every 12 Hours     meclizine (ANTIVERT) 25 MG tablet Take 1 tablet (25 mg total) by mouth 3 (three) times daily as needed for dizziness. (Patient not taking: Reported on 08/14/2019) 30 tablet 0   SAXENDA 18 MG/3ML SOPN Inject 3 mg into the skin daily.  (Patient not taking: Reported on 08/14/2019)     amoxicillin-clavulanate (AUGMENTIN) 875-125 MG tablet Take 1 tablet by mouth 2 (two) times daily for 10 days. (Patient not taking: Reported on 08/14/2019) 20 tablet 0   azithromycin (ZITHROMAX) 250 MG tablet Take 2 tablets (500 mg) on the first day. Then take 1 tablet (250mg ) on days 2-5. (Patient not taking: Reported on 08/14/2019) 6 tablet 0   cromolyn (NASALCROM) 5.2 MG/ACT nasal spray Place 1 spray into both nostrils 4 (four) times daily. (Patient not taking: Reported on 08/14/2019) 13 mL 1   No  facility-administered medications prior to visit.     Review of Systems:   Constitutional:   No  weight loss, night sweats,  Fevers, chills, fatigue, or  lassitude.  HEENT:   No headaches,  Difficulty swallowing,  Tooth/dental problems, or  Sore throat,                No sneezing, itching, ear ache. + nasal congestion, + post nasal drip.   CV:  No chest pain,  Orthopnea, PND, swelling in lower extremities, anasarca, dizziness, palpitations, syncope.   GI  No heartburn, indigestion, abdominal pain, nausea, vomiting, diarrhea, change in bowel habits, loss of appetite, bloody stools.   Resp: see HPI.  No coughing up of blood.  No chest wall deformity  Skin: no rash or lesions.  GU: no dysuria, change in color of urine, no urgency or frequency.  No flank pain, no hematuria   MS:  No joint pain or swelling.  No decreased range of motion.  No back pain.    Physical Exam  BP 122/68 (BP Location: Left Arm, Cuff Size: Normal)    Pulse 77    Temp (!) 97 F (36.1 C) (Temporal)    Ht 5\' 1"  (1.549 m)    Wt 204 lb 6.4 oz (92.7 kg)    SpO2 100%    BMI 38.62 kg/m   GEN: A/Ox3; pleasant , NAD, well nourished    HEENT:  [place]/AT,  EACs-clear, TMs-wnl, NOSE-clear, THROAT- erythema, cobblestoning of mucosa with evidence of postnasal drip with secretions present  NECK:  Supple w/ fair ROM; no JVD; normal carotid impulses w/o bruits; no thyromegaly or nodules palpated; no lymphadenopathy.    RESP  Clear  P & A; w/o, wheezes/ rales/ or rhonchi. no accessory muscle use, no dullness to percussion  CARD:  RRR, no m/r/g, no peripheral edema, pulses intact, no cyanosis or clubbing.  GI:   Soft & nt; nml bowel sounds; no organomegaly or masses detected.   Musco: Warm bil, no deformities or joint swelling noted.   Neuro: alert, no focal deficits noted.    Skin: Warm, no lesions or rashes    Lab Results:  CBC    Component Value Date/Time   WBC 8.1 01/15/2019 1220   RBC 5.06 01/15/2019 1220    HGB 15.3 (H) 01/15/2019 1220   HGB 14.1 10/04/2018 1349   HCT 46.9 (H) 01/15/2019 1220   HCT 41.8 10/04/2018 1349   PLT 270 01/15/2019 1220   PLT 288 10/04/2018 1349   MCV 92.7 01/15/2019 1220   MCV 90 10/04/2018 1349   MCH 30.2 01/15/2019 1220   MCHC 32.6 01/15/2019 1220   RDW 13.8 01/15/2019 1220   RDW 13.0 10/04/2018 1349   LYMPHSABS 2.1 10/04/2018 1349   MONOABS 1,008 (H) 08/16/2016 1208   EOSABS 0.1 10/04/2018 1349   BASOSABS 0.0 10/04/2018 1349    BMET    Component Value Date/Time   NA 138 01/15/2019 1220   NA 145 (H) 10/04/2018 1349   K 2.9 (L) 01/15/2019 1220   CL 98 01/15/2019 1220   CO2 29 01/15/2019 1220   GLUCOSE 88 01/15/2019 1220   BUN 13 01/15/2019 1220   BUN 17 10/04/2018 1349   CREATININE 0.74 01/15/2019 1220   CREATININE 0.97 07/10/2018 1241   CALCIUM 9.5 01/15/2019 1220   GFRNONAA >60 01/15/2019 1220   GFRNONAA 69 07/10/2018 1241   GFRAA >60 01/15/2019 1220   GFRAA 79 07/10/2018 1241    Imaging: DG Chest 2 View  Result Date: 08/04/2019 CLINICAL DATA:  Cough, shortness of breath, chest pain. EXAM: CHEST - 2 VIEW COMPARISON:  Chest x-ray dated 01/22/2019 chest x-ray dated 10/31/2013 FINDINGS: Heart size and mediastinal contours are within normal limits. Lungs are clear. No pleural effusion or pneumothorax is seen. Mild degenerative spondylosis of the midthoracic spine. No acute or suspicious osseous finding. IMPRESSION: No active cardiopulmonary disease. No evidence of pneumonia or pulmonary edema. Electronically Signed   By: Franki Cabot M.D.   On: 08/04/2019 13:58   DG Foot Complete Left  Result Date: 07/25/2019 Please see detailed radiograph report in office note.   ketorolac (TORADOL) injection 60 mg    Date Action Dose Route User   Discharged on 08/04/2019   Admitted on  08/04/2019   06/18/2019 1644 Given  Intramuscular (Right Upper Outer Quadrant) Shreve, Shana R, CMA    methylPREDNISolone acetate (DEPO-MEDROL) injection 80 mg    Date Action  Dose Route User   Discharged on 08/04/2019   Admitted on 08/04/2019   06/18/2019 1645 Given  Intramuscular (Left Upper Outer Quadrant) Shelda Altes, CMA      Assessment & Plan:   Stephanie Cannon is a 11 year old woman with history of GERD, hypertension, hyperlipidemia and obesity who is referred to pulmonary clinic for evaluation of cough, wheezing and shortness of breath.   Her cough appears related to her sinus congestion and post-nasal drip. There is also a possible component of reactive airways disease given her symptoms of wheezing and relief with bronchodilators. She is to start fluticasone nasal spray daily and ipratropium nasal spray 3 times daily to start for 3 days, then as needed mainly before bedtime. She is to continue the wixela or advair inhaler 1 puff twice daily and as needed albuterol nebulizer treatments. We will order her a nebulizer machine.   We will schedule her for complete pulmonary function testing when her symptoms are improved. She is to follow up in 3 months.  Freddi Starr, MD 08/14/2019

## 2019-08-15 ENCOUNTER — Other Ambulatory Visit: Payer: Self-pay | Admitting: Family Medicine

## 2019-08-16 ENCOUNTER — Other Ambulatory Visit: Payer: Self-pay

## 2019-08-16 ENCOUNTER — Other Ambulatory Visit: Payer: Self-pay | Admitting: Family

## 2019-08-16 ENCOUNTER — Other Ambulatory Visit: Payer: Self-pay | Admitting: Emergency Medicine

## 2019-08-16 ENCOUNTER — Ambulatory Visit
Admission: RE | Admit: 2019-08-16 | Discharge: 2019-08-16 | Disposition: A | Discharge status: Home / Self Care | Payer: BC Managed Care – PPO | Source: Ambulatory Visit | Attending: Family | Admitting: Family

## 2019-08-16 DIAGNOSIS — N200 Calculus of kidney: Secondary | ICD-10-CM

## 2019-08-16 MED ORDER — IOPAMIDOL (ISOVUE-300) INJECTION 61%
100.0000 mL | Freq: Once | INTRAVENOUS | Status: AC | PRN
  Administered 2019-08-16: 100 mL via INTRAVENOUS

## 2019-08-17 ENCOUNTER — Encounter: Payer: Self-pay | Admitting: Family Medicine

## 2019-08-19 ENCOUNTER — Encounter: Payer: Self-pay | Admitting: Family Medicine

## 2019-08-20 ENCOUNTER — Ambulatory Visit (INDEPENDENT_AMBULATORY_CARE_PROVIDER_SITE_OTHER): Payer: BC Managed Care – PPO | Admitting: Family Medicine

## 2019-08-20 ENCOUNTER — Other Ambulatory Visit: Payer: Self-pay

## 2019-08-20 ENCOUNTER — Encounter: Payer: Self-pay | Admitting: Family Medicine

## 2019-08-20 ENCOUNTER — Ambulatory Visit (HOSPITAL_COMMUNITY)
Admission: RE | Admit: 2019-08-20 | Discharge: 2019-08-20 | Disposition: A | Discharge status: Home / Self Care | Payer: BC Managed Care – PPO | Source: Ambulatory Visit | Attending: Family Medicine | Admitting: Family Medicine

## 2019-08-20 VITALS — BP 130/84 | HR 79 | Resp 16 | Ht 61.0 in | Wt 197.0 lb

## 2019-08-20 DIAGNOSIS — M5410 Radiculopathy, site unspecified: Secondary | ICD-10-CM

## 2019-08-20 DIAGNOSIS — R059 Cough, unspecified: Secondary | ICD-10-CM

## 2019-08-20 DIAGNOSIS — I10 Essential (primary) hypertension: Secondary | ICD-10-CM

## 2019-08-20 DIAGNOSIS — R05 Cough: Secondary | ICD-10-CM

## 2019-08-20 MED ORDER — METHYLPREDNISOLONE ACETATE 80 MG/ML IJ SUSP
80.0000 mg | Freq: Once | INTRAMUSCULAR | Status: AC
  Administered 2019-08-20: 80 mg via INTRAMUSCULAR

## 2019-08-20 MED ORDER — PREDNISONE 20 MG PO TABS
ORAL_TABLET | ORAL | 0 refills | Status: DC
Start: 2019-08-20 — End: 2019-09-23

## 2019-08-20 MED ORDER — GABAPENTIN 100 MG PO CAPS: ORAL_CAPSULE | ORAL | 1 refills | Status: DC

## 2019-08-20 NOTE — Progress Notes (Signed)
   Danessa Mensch     MRN: [id number]      DOB: 01/13/1970   HPI Ms. Ullery is here for follow up and re-evaluation of chronic medical conditions, medication management and review of any available recent lab and radiology data.  Preventive health is updated, specifically  Cancer screening and Immunization.   Questions or concerns regarding consultations or procedures which the PT has had in the interim are  addressed. The PT denies any adverse reactions to current medications since the last visit.  1 week h/o back pain radaiting to left buttock , up to a 10, no red flags, no inciting injury 3 week h/o increased nasal congestion and  Cough, currently being managed by Pulmonary  ROS Denies recent fever or chills. Denies chest pains, palpitations and leg swelling Denies abdominal pain, nausea, vomiting,diarrhea or constipation.   Denies dysuria, frequency, hesitancy or incontinence.  Denies headaches, seizures, numbness, or tingling.   PE  BP 130/84   Pulse 79   Resp 16   Ht 5\' 1"  (1.549 m)   Wt 197 lb (89.4 kg)   SpO2 98%   BMI 37.22 kg/m   Patient alert and oriented and in no cardiopulmonary distress.Pt in pain  HEENT: No facial asymmetry, EOMI,     Neck supple .  Chest: Clear to auscultation bilaterally.  CVS: S1, S2 no murmurs, no S3.Regular rate.  ABD: Soft non tender.   Ext: No edema  MS: Decreased  ROM lumbar spine, adequate in shoulders, hips and knees.  Skin: Intact, no ulcerations or rash noted.  Psych: Good eye contact, normal affect. Memory intact not anxious or depressed appearing.  CNS: CN 2-12 intact, power,  normal throughout.no focal deficits noted.   Assessment & Plan  Back pain with left-sided radiculopathy Depomedrol 80 mg iM, followed by short course of prednisone and gabapentin at bedtime for sleep X ray lumbar spine today If not improved in 3 to 5 days will need MRI, has already had back surgery  Cough Chronic cough being treated by  pulmonary  Essential hypertension Controlled, no change in medication DASH diet and commitment to daily physical activity for a minimum of 30 minutes discussed and encouraged, as a part of hypertension management. The importance of attaining a healthy weight is also discussed.  BP/Weight 08/20/2019 08/14/2019 08/04/2019 07/31/2019 06/18/2019 01/21/2019 0/99/8338  Systolic BP 250 539 767 341 937 902 409  Diastolic BP 84 68 85 84 84 74 74  Wt. (Lbs) 197 204.4 191 194 194 205 200.62  BMI 37.22 38.62 36.09 35.48 36.66 38.73 37.91  Some encounter information is confidential and restricted. Go to Review Flowsheets activity to see all data.

## 2019-08-20 NOTE — Assessment & Plan Note (Addendum)
Depomedrol 80 mg iM, followed by short course of prednisone and gabapentin at bedtime for sleep X ray lumbar spine today If not improved in 3 to 5 days will need MRI, has already had back surgery

## 2019-08-20 NOTE — Patient Instructions (Signed)
Please keep September follow up appointment , call if you need me sooner  X ray of low back today  Depo medrol 80 mg IM in office today and short course of prednisone  Is prescribed, also bedtime gabapentin for pain.  Call in next 3 to 7 days if not much better will need to order MRI if this is the case  [facility] you improve soon!  Thanks for choosing [facility], we consider it a privelige to serve you.

## 2019-08-21 ENCOUNTER — Telehealth: Payer: Self-pay | Admitting: Pulmonary Disease

## 2019-08-21 DIAGNOSIS — R059 Cough, unspecified: Secondary | ICD-10-CM

## 2019-08-21 DIAGNOSIS — R05 Cough: Secondary | ICD-10-CM

## 2019-08-21 DIAGNOSIS — J4521 Mild intermittent asthma with (acute) exacerbation: Secondary | ICD-10-CM

## 2019-08-21 NOTE — Telephone Encounter (Signed)
Dr. August Albino NPI is current, please verify with DME. He is a new listing.

## 2019-08-21 NOTE — Telephone Encounter (Signed)
Spoke to [place] with [employer], who confirmed that they did receive Rx for nebulizer machine.  Shanai requested that last OV note be faxed to [phone]. Note has been faxed to provided fax number.  ATC pt to relay this information. Unable to leave voicemail due to mailbox being full.  Will call back.

## 2019-08-22 NOTE — Telephone Encounter (Signed)
Yes that is ok place order   Aaron Edelman

## 2019-08-22 NOTE — Telephone Encounter (Signed)
Please see other phone note from 8.18.21. Will sign off.

## 2019-08-22 NOTE — Telephone Encounter (Addendum)
I called [employer] and spoke to [place].  She states they checked [employer] to verify NPI # for [place] & it shows "no enrolled practitioner found".  She checked NPI registry and it shows "student in organized healthcare education training program".  She states someone else will need to sign order.  Will route back to triage to place new order and make them aware of issue.

## 2019-08-22 NOTE — Telephone Encounter (Signed)
Will send to APP of the day for recommenaitons  Aaron Edelman please advise

## 2019-08-22 NOTE — Telephone Encounter (Signed)
Per Aaron Edelman ok to place order for neb machine and he would sign it. Order has been placed. Nothing further needed at this time.

## 2019-08-24 ENCOUNTER — Encounter: Payer: Self-pay | Admitting: Family Medicine

## 2019-08-25 ENCOUNTER — Encounter: Payer: Self-pay | Admitting: Family Medicine

## 2019-08-25 NOTE — Assessment & Plan Note (Signed)
Controlled, no change in medication DASH diet and commitment to daily physical activity for a minimum of 30 minutes discussed and encouraged, as a part of hypertension management. The importance of attaining a healthy weight is also discussed.  BP/Weight 08/20/2019 08/14/2019 08/04/2019 07/31/2019 06/18/2019 01/21/2019 0/25/4270  Systolic BP 623 762 831 517 616 073 710  Diastolic BP 84 68 85 84 84 74 74  Wt. (Lbs) 197 204.4 191 194 194 205 200.62  BMI 37.22 38.62 36.09 35.48 36.66 38.73 37.91  Some encounter information is confidential and restricted. Go to Review Flowsheets activity to see all data.

## 2019-08-25 NOTE — Assessment & Plan Note (Signed)
Chronic cough being treated by pulmonary

## 2019-08-27 ENCOUNTER — Encounter: Payer: Self-pay | Admitting: Pulmonary Disease

## 2019-08-29 ENCOUNTER — Telehealth: Payer: Self-pay | Admitting: Pulmonary Disease

## 2019-08-30 NOTE — Telephone Encounter (Signed)
I called [facility] to check status of nebulizer order. Order is received, but they are needing last office notes to get this order processed.  I asked for fax number to send office notes, but was forwarded to another team member's voicemail.   PCCs please advise if you can fax patient's last office note to [place] for her nebulizer order to be processed; we have been trying to get this filled for her since 08/14/19.  Thanks!

## 2019-08-30 NOTE — Telephone Encounter (Signed)
Notes have been faxed

## 2019-08-30 NOTE — Telephone Encounter (Signed)
Margarita Grizzle from [facility] calling to state that they have all the information they need for the pt to get her nebulizer and she is coming in today 08/30/19 to get her machine. Nothing further is needed.

## 2019-09-03 ENCOUNTER — Encounter: Payer: Self-pay | Admitting: Family Medicine

## 2019-09-03 ENCOUNTER — Other Ambulatory Visit: Payer: Self-pay | Admitting: Family Medicine

## 2019-09-03 DIAGNOSIS — Z0000 Encounter for general adult medical examination without abnormal findings: Secondary | ICD-10-CM

## 2019-09-06 ENCOUNTER — Ambulatory Visit: Payer: BC Managed Care – PPO | Admitting: Podiatry

## 2019-09-12 ENCOUNTER — Encounter: Payer: Self-pay | Admitting: Family Medicine

## 2019-09-12 NOTE — Telephone Encounter (Signed)
Looks like per Dr Griffin Dakin note she sees pulmonary for chronic cough. I would think she should ask them to make sure no changes need to be made.  Some people depending on their diagnosis or lungs can keep a wheeze at times. Avoid smoke, strong smells, and take meds/ inhalers as directed and reach out to them.

## 2019-09-12 NOTE — Telephone Encounter (Signed)
Spoke with patient and advised of what she needed to do . She is aware with verbal understanding.

## 2019-09-23 ENCOUNTER — Ambulatory Visit (INDEPENDENT_AMBULATORY_CARE_PROVIDER_SITE_OTHER): Payer: BC Managed Care – PPO | Admitting: Family Medicine

## 2019-09-23 ENCOUNTER — Other Ambulatory Visit: Payer: Self-pay

## 2019-09-23 ENCOUNTER — Encounter: Payer: Self-pay | Admitting: Family Medicine

## 2019-09-23 VITALS — BP 130/80 | HR 80 | Ht 61.0 in | Wt 206.0 lb

## 2019-09-23 DIAGNOSIS — Z1159 Encounter for screening for other viral diseases: Secondary | ICD-10-CM

## 2019-09-23 DIAGNOSIS — F409 Phobic anxiety disorder, unspecified: Secondary | ICD-10-CM

## 2019-09-23 DIAGNOSIS — F419 Anxiety disorder, unspecified: Secondary | ICD-10-CM

## 2019-09-23 DIAGNOSIS — M5410 Radiculopathy, site unspecified: Secondary | ICD-10-CM

## 2019-09-23 DIAGNOSIS — E559 Vitamin D deficiency, unspecified: Secondary | ICD-10-CM

## 2019-09-23 DIAGNOSIS — E785 Hyperlipidemia, unspecified: Secondary | ICD-10-CM

## 2019-09-23 DIAGNOSIS — M62830 Muscle spasm of back: Secondary | ICD-10-CM

## 2019-09-23 DIAGNOSIS — Z23 Encounter for immunization: Secondary | ICD-10-CM | POA: Diagnosis not present

## 2019-09-23 DIAGNOSIS — I10 Essential (primary) hypertension: Secondary | ICD-10-CM

## 2019-09-23 DIAGNOSIS — F5105 Insomnia due to other mental disorder: Secondary | ICD-10-CM

## 2019-09-23 MED ORDER — IBUPROFEN 800 MG PO TABS: 800.0000 mg | ORAL_TABLET | Freq: Three times a day (TID) | ORAL | 0 refills | Status: DC | PRN

## 2019-09-23 MED ORDER — GABAPENTIN 300 MG PO CAPS: ORAL_CAPSULE | ORAL | 5 refills | Status: DC

## 2019-09-23 NOTE — Patient Instructions (Signed)
F/U in 4.5  months, shingrix # 1 at visit, call if you need me sooner   Flu vaccine today  Increase in gabapentin dose to 300 mg one to two at bedtime  Limited ibuprofen 800 mg , maximum of one tablet 3 times weekly, no refills until your return visit  Fasting lipid, cmp and eGFR, tSH, vit D , hepatitis c screen and cBC this week please  It is important that you exercise regularly at least 30 minutes 5 times a week. If you develop chest pain, have severe difficulty breathing, or feel very tired, stop exercising immediately and seek medical attention  Think about what you will eat, plan ahead. Choose " clean, green, fresh or frozen" over canned, processed or packaged foods which are more sugary, salty and fatty. 70 to 75% of food eaten should be vegetables and fruit. Three meals at set times with snacks allowed between meals, but they must be fruit or vegetables. Aim to eat over a 12 hour period , example 7 am to 7 pm, and STOP after  your last meal of the day. Drink water,generally about 64 ounces per day, no other drink is as healthy. Fruit juice is best enjoyed in a healthy way, by EATING the fruit.  Thanks for choosing [facility], we consider it a privelige to serve you.

## 2019-09-23 NOTE — Addendum Note (Signed)
Addended by: Fayrene Helper on: 09/23/2019 04:30 PM   Modules accepted: Orders

## 2019-09-24 ENCOUNTER — Ambulatory Visit: Payer: BC Managed Care – PPO | Admitting: Family Medicine

## 2019-09-27 ENCOUNTER — Encounter: Payer: Self-pay | Admitting: Family Medicine

## 2019-09-28 ENCOUNTER — Encounter: Payer: Self-pay | Admitting: Family Medicine

## 2019-09-28 NOTE — Assessment & Plan Note (Signed)
Unchanged continue daily xanax

## 2019-09-28 NOTE — Assessment & Plan Note (Signed)
Controlled, no change in medication DASH diet and commitment to daily physical activity for a minimum of 30 minutes discussed and encouraged, as a part of hypertension management. The importance of attaining a healthy weight is also discussed.  BP/Weight 09/23/2019 08/20/2019 08/14/2019 08/04/2019 07/31/2019 06/18/2019 3/68/5992  Systolic BP 341 443 601 658 006 349 494  Diastolic BP 80 84 68 85 84 84 74  Wt. (Lbs) 206 197 204.4 191 194 194 205  BMI 38.92 37.22 38.62 36.09 35.48 36.66 38.73  Some encounter information is confidential and restricted. Go to Review Flowsheets activity to see all data.

## 2019-09-28 NOTE — Progress Notes (Signed)
   Stephanie Cannon     MRN: [id number]      DOB: 10-15-1970   HPI Stephanie Cannon is here for follow up and re-evaluation of chronic medical conditions, medication management and review of any available recent lab and radiology data.  Preventive health is updated, specifically  Cancer screening and Immunization.   Questions or concerns regarding consultations or procedures which the PT has had in the interim are  addressed. The PT denies any adverse reactions to current medications since the last visit.  2 day h/o increased low back pain, unprovoked  ROS Denies recent fever or chills. Denies sinus pressure, nasal congestion, ear pain or sore throat. Denies chest congestion, productive cough or wheezing. Denies chest pains, palpitations and leg swelling Denies abdominal pain, nausea, vomiting,diarrhea or constipation.   Denies dysuria, frequency, hesitancy or incontinence.  Denies headaches, seizures, numbness, or tingling. Denies depression, anxiety or insomnia. Denies skin break down or rash.   PE  BP 130/80   Pulse 80   Ht 5\' 1"  (1.549 m)   Wt 206 lb (93.4 kg)   BMI 38.92 kg/m   Patient alert and oriented and in no cardiopulmonary distress.  HEENT: No facial asymmetry, EOMI,     Neck supple .  Chest: Clear to auscultation bilaterally.  CVS: S1, S2 no murmurs, no S3.Regular rate.  ABD: Soft non tender.   Ext: No edema  MS: Adequate though reduced  ROM spine,normal in  shoulders, hips and knees.  Skin: Intact, no ulcerations or rash noted.  Psych: Good eye contact, normal affect. Memory intact not anxious or depressed appearing.  CNS: CN 2-12 intact, power,  normal throughout.no focal deficits noted.   Assessment & Plan  Essential hypertension Controlled, no change in medication DASH diet and commitment to daily physical activity for a minimum of 30 minutes discussed and encouraged, as a part of hypertension management. The importance of attaining a healthy weight is  also discussed.  BP/Weight 09/23/2019 08/20/2019 08/14/2019 08/04/2019 07/31/2019 06/18/2019 7/85/8850  Systolic BP 277 412 878 676 720 947 096  Diastolic BP 80 84 68 85 84 84 74  Wt. (Lbs) 206 197 204.4 191 194 194 205  BMI 38.92 37.22 38.62 36.09 35.48 36.66 38.73  Some encounter information is confidential and restricted. Go to Review Flowsheets activity to see all data.       Back pain with left-sided radiculopathy Increased and uncontrolled , increase gabapentin doe and limited supply of ibuprofen prescribed for judicious use  Insomnia due to anxiety and fear Sleep hygiene reviewed and written information offered also. Prescription sent for  medication needed.   Spasm of muscle of lower back Continue bedtime flexeril  Hyperlipidemia LDL goal <100 Hyperlipidemia:Low fat diet discussed and encouraged.   Lipid Panel  Lab Results  Component Value Date   CHOL 213 (H) 07/10/2018   HDL 59 07/10/2018   LDLCALC 132 (H) 07/10/2018   TRIG 108 07/10/2018   CHOLHDL 3.6 07/10/2018  Updated lab needed at/ before next visit. Not at goal     Anxiety Unchanged continue daily xanax

## 2019-09-28 NOTE — Assessment & Plan Note (Signed)
Sleep hygiene reviewed and written information offered also. Prescription sent for  medication needed.  

## 2019-09-28 NOTE — Assessment & Plan Note (Signed)
Hyperlipidemia:Low fat diet discussed and encouraged.   Lipid Panel  Lab Results  Component Value Date   CHOL 213 (H) 07/10/2018   HDL 59 07/10/2018   LDLCALC 132 (H) 07/10/2018   TRIG 108 07/10/2018   CHOLHDL 3.6 07/10/2018  Updated lab needed at/ before next visit. Not at goal

## 2019-09-28 NOTE — Assessment & Plan Note (Signed)
Increased and uncontrolled , increase gabapentin doe and limited supply of ibuprofen prescribed for judicious use

## 2019-09-28 NOTE — Assessment & Plan Note (Signed)
Continue bedtime flexeril

## 2019-09-30 ENCOUNTER — Ambulatory Visit: Payer: BC Managed Care – PPO

## 2019-10-08 ENCOUNTER — Other Ambulatory Visit: Payer: Self-pay

## 2019-10-08 ENCOUNTER — Ambulatory Visit (INDEPENDENT_AMBULATORY_CARE_PROVIDER_SITE_OTHER): Payer: BC Managed Care – PPO | Admitting: Pulmonary Disease

## 2019-10-08 ENCOUNTER — Other Ambulatory Visit: Payer: Self-pay | Admitting: Family Medicine

## 2019-10-08 DIAGNOSIS — R059 Cough, unspecified: Secondary | ICD-10-CM | POA: Diagnosis not present

## 2019-10-08 LAB — PULMONARY FUNCTION TEST
DL/VA % pred: 137 %
DL/VA: 6.02 ml/min/mmHg/L
DLCO cor % pred: 110 %
DLCO cor: 21.68 ml/min/mmHg
DLCO unc % pred: 110 %
DLCO unc: 21.68 ml/min/mmHg
FEF 25-75 Post: 2.32 L/sec
FEF 25-75 Pre: 2.62 L/sec
FEF2575-%Change-Post: -11 %
FEF2575-%Pred-Post: 100 %
FEF2575-%Pred-Pre: 113 %
FEV1-%Change-Post: 3 %
FEV1-%Pred-Post: 98 %
FEV1-%Pred-Pre: 95 %
FEV1-Post: 2.12 L
FEV1-Pre: 2.06 L
FEV1FVC-%Change-Post: 1 %
FEV1FVC-%Pred-Pre: 105 %
FEV6-%Change-Post: 1 %
FEV6-%Pred-Post: 93 %
FEV6-%Pred-Pre: 91 %
FEV6-Post: 2.42 L
FEV6-Pre: 2.38 L
FEV6FVC-%Pred-Post: 103 %
FEV6FVC-%Pred-Pre: 103 %
FVC-%Change-Post: 1 %
FVC-%Pred-Post: 90 %
FVC-%Pred-Pre: 89 %
FVC-Post: 2.42 L
FVC-Pre: 2.38 L
Post FEV1/FVC ratio: 88 %
Post FEV6/FVC ratio: 100 %
Pre FEV1/FVC ratio: 86 %
Pre FEV6/FVC Ratio: 100 %
RV % pred: 56 %
RV: 0.95 L
TLC % pred: 73 %
TLC: 3.5 L

## 2019-10-08 NOTE — Progress Notes (Signed)
Full PFT performed today. °

## 2019-10-14 ENCOUNTER — Ambulatory Visit
Admission: RE | Admit: 2019-10-14 | Discharge: 2019-10-14 | Disposition: A | Discharge status: Home / Self Care | Payer: BC Managed Care – PPO | Source: Ambulatory Visit | Attending: Family Medicine | Admitting: Family Medicine

## 2019-10-14 ENCOUNTER — Other Ambulatory Visit: Payer: Self-pay

## 2019-10-14 DIAGNOSIS — Z0000 Encounter for general adult medical examination without abnormal findings: Secondary | ICD-10-CM

## 2019-10-17 ENCOUNTER — Encounter: Payer: Self-pay | Admitting: Family Medicine

## 2019-10-22 ENCOUNTER — Telehealth: Payer: Self-pay | Admitting: Pulmonary Disease

## 2019-10-22 NOTE — Telephone Encounter (Signed)
Informed patient hat her pulmonary function tests were normal except for a mild restrictive defect which could be related to her weight. She has recovered well since our last visit and currently denies shortness of breath, sinus drainage, cough or wheezing. She has stopped using the flonase and ipratropium nasal sprays along with the inhalers/nebulizer treatments.   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 Should she develop similar symptoms in the past, we will be happy to see her again.

## 2019-10-30 ENCOUNTER — Other Ambulatory Visit: Payer: Self-pay

## 2019-10-30 ENCOUNTER — Encounter: Payer: Self-pay | Admitting: Internal Medicine

## 2019-10-30 ENCOUNTER — Ambulatory Visit (INDEPENDENT_AMBULATORY_CARE_PROVIDER_SITE_OTHER): Payer: BC Managed Care – PPO | Admitting: Internal Medicine

## 2019-10-30 ENCOUNTER — Telehealth: Payer: Self-pay

## 2019-10-30 VITALS — BP 122/78 | HR 86 | Resp 18 | Ht 61.0 in | Wt 204.0 lb

## 2019-10-30 DIAGNOSIS — E669 Obesity, unspecified: Secondary | ICD-10-CM

## 2019-10-30 DIAGNOSIS — M5410 Radiculopathy, site unspecified: Secondary | ICD-10-CM

## 2019-10-30 DIAGNOSIS — M4316 Spondylolisthesis, lumbar region: Secondary | ICD-10-CM

## 2019-10-30 MED ORDER — KETOROLAC TROMETHAMINE 60 MG/2ML IM SOLN: 60.0000 mg | Freq: Once | INTRAMUSCULAR | Status: AC

## 2019-10-30 NOTE — Patient Instructions (Addendum)
Please continue to take Gabapentin and Flexeril (for muscle spasms).  You are being referred to physical therapy for back exercises and strength training.  Weight loss is very important for long-term management of chronic low back pain.  Please avoid lifting weight more than 5 lbs.   Back Exercises These exercises help to make your trunk and back strong. They also help to keep the lower back flexible. Doing these exercises can help to prevent back pain or lessen existing pain.  If you have back pain, try to do these exercises 2-3 times each day or as told by your doctor.  As you get better, do the exercises once each day. Repeat the exercises more often as told by your doctor.  To stop back pain from coming back, do the exercises once each day, or as told by your doctor. Exercises Single knee to chest Do these steps 3-5 times in a row for each leg: 1. Lie on your back on a firm bed or the floor with your legs stretched out. 2. Bring one knee to your chest. 3. Grab your knee or thigh with both hands and hold them it in place. 4. Pull on your knee until you feel a gentle stretch in your lower back or buttocks. 5. Keep doing the stretch for 10-30 seconds. 6. Slowly let go of your leg and straighten it. Pelvic tilt Do these steps 5-10 times in a row: 1. Lie on your back on a firm bed or the floor with your legs stretched out. 2. Bend your knees so they point up to the ceiling. Your feet should be flat on the floor. 3. Tighten your lower belly (abdomen) muscles to press your lower back against the floor. This will make your tailbone point up to the ceiling instead of pointing down to your feet or the floor. 4. Stay in this position for 5-10 seconds while you gently tighten your muscles and breathe evenly. Cat-cow Do these steps until your lower back bends more easily: 1. Get on your hands and knees on a firm surface. Keep your hands under your shoulders, and keep your knees under your  hips. You may put padding under your knees. 2. Let your head hang down toward your chest. Tighten (contract) the muscles in your belly. Point your tailbone toward the floor so your lower back becomes rounded like the back of a cat. 3. Stay in this position for 5 seconds. 4. Slowly lift your head. Let the muscles of your belly relax. Point your tailbone up toward the ceiling so your back forms a sagging arch like the back of a cow. 5. Stay in this position for 5 seconds.  Press-ups Do these steps 5-10 times in a row: 1. Lie on your belly (face-down) on the floor. 2. Place your hands near your head, about shoulder-width apart. 3. While you keep your back relaxed and keep your hips on the floor, slowly straighten your arms to raise the top half of your body and lift your shoulders. Do not use your back muscles. You may change where you place your hands in order to make yourself more comfortable. 4. Stay in this position for 5 seconds. 5. Slowly return to lying flat on the floor.  Bridges Do these steps 10 times in a row: 1. Lie on your back on a firm surface. 2. Bend your knees so they point up to the ceiling. Your feet should be flat on the floor. Your arms should be flat at your  sides, next to your body. 3. Tighten your butt muscles and lift your butt off the floor until your waist is almost as high as your knees. If you do not feel the muscles working in your butt and the back of your thighs, slide your feet 1-2 inches farther away from your butt. 4. Stay in this position for 3-5 seconds. 5. Slowly lower your butt to the floor, and let your butt muscles relax. If this exercise is too easy, try doing it with your arms crossed over your chest. Belly crunches Do these steps 5-10 times in a row: 1. Lie on your back on a firm bed or the floor with your legs stretched out. 2. Bend your knees so they point up to the ceiling. Your feet should be flat on the floor. 3. Cross your arms over your  chest. 4. Tip your chin a little bit toward your chest but do not bend your neck. 5. Tighten your belly muscles and slowly raise your chest just enough to lift your shoulder blades a tiny bit off of the floor. Avoid raising your body higher than that, because it can put too much stress on your low back. 6. Slowly lower your chest and your head to the floor. Back lifts Do these steps 5-10 times in a row: 1. Lie on your belly (face-down) with your arms at your sides, and rest your forehead on the floor. 2. Tighten the muscles in your legs and your butt. 3. Slowly lift your chest off of the floor while you keep your hips on the floor. Keep the back of your head in line with the curve in your back. Look at the floor while you do this. 4. Stay in this position for 3-5 seconds. 5. Slowly lower your chest and your face to the floor. Contact a doctor if:  Your back pain gets a lot worse when you do an exercise.  Your back pain does not get better 2 hours after you exercise. If you have any of these problems, stop doing the exercises. Do not do them again unless your doctor says it is okay. Get help right away if:  You have sudden, very bad back pain. If this happens, stop doing the exercises. Do not do them again unless your doctor says it is okay. This information is not intended to replace advice given to you by your health care provider. Make sure you discuss any questions you have with your health care provider. Document Revised: 09/14/2017 Document Reviewed: 09/14/2017 Elsevier Patient Education  2020 [employer].

## 2019-10-30 NOTE — Progress Notes (Signed)
Acute Office Visit  Subjective:    Patient ID: Stephanie Cannon, female    DOB: 1970/06/15, 49 y.o.   MRN: [id number]  Chief Complaint  Patient presents with  . Back Pain    x 3 days     HPI Patient is in today for acute on chronic back pain.  Back Pain  She reports acute on chronic back pain. There was not an injury that may have caused the pain. The most recent episode started a few days ago and is staying constant. The pain is located in the lumbar area withot radiation. It is described as aching, is 7-9/10 in intensity, occurring constantly.   Aggravating factors: bending forwards and walking Relieving factors: none.  She has tried NSAIDs and prescription pain relievers with little relief.   Associated symptoms: No abdominal pain No bowel incontinence  No chest pain No dysuria   No fever No headaches   No pelvic pain  No weakness in leg  No tingling in lower extremities  No urinary incontinence No weight loss      Past Medical History:  Diagnosis Date  . Arthritis   . Asthma    Phreesia 07/31/2019  . Breast discharge    left spont white discharge x 6 months  . Carpal tunnel syndrome    left, wears brace at night  . Chronic back pain   . GERD (gastroesophageal reflux disease)   . Hyperlipidemia   . Hypertension   . Obesity     Past Surgical History:  Procedure Laterality Date  . ABDOMINAL HYSTERECTOMY  2003   partial  . BREAST BIOPSY Left 2018   benign  . CARPAL TUNNEL RELEASE Right   . CESAREAN SECTION  1988  . CESAREAN SECTION N/A    Phreesia 07/31/2019  . CHOLECYSTECTOMY    . COLONOSCOPY N/A 09/21/2012   Procedure: COLONOSCOPY;  Surgeon: Danie Binder, MD;  [facility];  Service: Endoscopy;  Laterality: N/A;  9:30  . LAPAROSCOPIC GASTRIC SLEEVE RESECTION WITH HIATAL HERNIA REPAIR N/A 06/23/2014   Procedure: LAPAROSCOPIC LYSIS OF ADHESIONS, GASTRIC SLEEVE RESECTION AND UPPER ENDO;  Surgeon: Alphonsa Overall, MD;  Location: [facility];  Service:  General;  Laterality: N/A;  . LAPAROSCOPIC LYSIS OF ADHESIONS  06/23/2014   Procedure: LAPAROSCOPIC LYSIS OF ADHESIONS;  Surgeon: Alphonsa Overall, MD;  Location: [facility];  Service: General;;  . LUMBAR FUSION  01/22/2014   L4 L5       DR Arnoldo Morale  . other     Vocal chord polyp removal  . 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 SPINE SURGERY N/A    Phreesia 07/31/2019  . TONSILLECTOMY    . UPPER GI ENDOSCOPY  06/23/2014   Procedure: UPPER GI ENDOSCOPY;  Surgeon: Alphonsa Overall, MD;  [facility];  Service: General;;    Family History  Problem Relation Age of Onset  . Hypertension Mother   . Hypertension Father   . Hypertension Brother   . Cancer Maternal Aunt   . Cancer Maternal Uncle        liver  . Cancer Paternal Uncle        bone cancer  . Diabetes Maternal Grandmother   . Colon cancer Neg Hx     Social History   Socioeconomic History  . Marital status: Married    Spouse name: Not on file  . Number of children: Not on file  . Years of education: Not on file  .  Highest education level: Not on file  Occupational History  . Occupation: Soil scientist: [employer]   Tobacco Use  . Smoking status: Former Smoker    Packs/day: 0.75    Years: 17.00    Pack years: 12.75    Types: Cigarettes    Quit date: 01/03/2009    Years since quitting: 10.8  . Smokeless tobacco: Never Used  Vaping Use  . Vaping Use: Never used  Substance and Sexual Activity  . Alcohol use: Yes    Comment: occasionally  . Drug use: No  . Sexual activity: Not on file  Other Topics Concern  . Not on file  Social History Narrative  . Not on file   Social Determinants of Health   Financial Resource Strain:   . Difficulty of Paying Living Expenses: Not on file  Food Insecurity:   . Worried About Charity fundraiser in the Last Year: Not on file  . Ran Out of Food in the Last Year: Not on file  Transportation Needs:   . Lack of Transportation (Medical): Not  on file  . Lack of Transportation (Non-Medical): Not on file  Physical Activity:   . Days of Exercise per Week: Not on file  . Minutes of Exercise per Session: Not on file  Stress:   . Feeling of Stress : Not on file  Social Connections:   . Frequency of Communication with Friends and Family: Not on file  . Frequency of Social Gatherings with Friends and Family: Not on file  . Attends Religious Services: Not on file  . Active Member of Clubs or Organizations: Not on file  . Attends Archivist Meetings: Not on file  . Marital Status: Not on file  Intimate Partner Violence:   . Fear of Current or Ex-Partner: Not on file  . Emotionally Abused: Not on file  . Physically Abused: Not on file  . Sexually Abused: Not on file    Outpatient Medications Prior to Visit  Medication Sig Dispense Refill  . albuterol (PROVENTIL) (2.5 MG/3ML) 0.083% nebulizer solution Take 3 mLs (2.5 mg total) by nebulization every 6 (six) hours as needed for wheezing or shortness of breath. 360 mL 3  . albuterol (VENTOLIN HFA) 108 (90 Base) MCG/ACT inhaler Inhale 2 puffs into the lungs every 6 (six) hours as needed for wheezing or shortness of breath. 18 g 5  . ALPRAZolam (XANAX) 1 MG tablet Take 1 tablet (1 mg total) by mouth at bedtime. 30 tablet 3  . cyclobenzaprine (FLEXERIL) 10 MG tablet TAKE 1 TABLET BY MOUTH AT BEDTIME. 90 tablet 0  . esomeprazole (NEXIUM) 40 MG capsule TAKE ONE CAPSULE BY MOUTH ONCE DAILY. 90 capsule 0  . fluticasone (FLONASE) 50 MCG/ACT nasal spray Place 1 spray into both nostrils daily. 16 g 2  . Fluticasone-Salmeterol (ADVAIR DISKUS) 250-50 MCG/DOSE AEPB Inhale 1 puff into the lungs daily. 14 each 0  . gabapentin (NEURONTIN) 300 MG capsule Take one to two capsules at bedtime  For back pain 60 capsule 5  . ibuprofen (ADVIL) 800 MG tablet Take 1 tablet (800 mg total) by mouth every 8 (eight) hours as needed. 30 tablet 0  . ipratropium (ATROVENT) 0.03 % nasal spray Place 2 sprays  into both nostrils 3 (three) times daily as needed for rhinitis. 30 mL 12  . Multiple Vitamins-Minerals (HAIR SKIN & NAILS ADVANCED) TABS Take 1 tablet by mouth daily.    Marland Kitchen  Multiple Vitamins-Minerals (MULTIVITAMIN GUMMIES ADULT PO) Take 1 each by mouth 2 (two) times daily.    Marland Kitchen SAXENDA 18 MG/3ML SOPN Inject 3 mg into the skin daily.     Marland Kitchen triamterene-hydrochlorothiazide (MAXZIDE) 75-50 MG tablet TAKE 1 TABLET BY MOUTH ONCE DAILY. 90 tablet 0  . valACYclovir (VALTREX) 1000 MG tablet SMARTSIG:1 Tablet(s) By Mouth Every 12 Hours    . potassium chloride SA (KLOR-CON) 20 MEQ tablet Take 1 tablet (20 mEq total) by mouth 2 (two) times daily for 5 days. 10 tablet 0   No facility-administered medications prior to visit.    Allergies  Allergen Reactions  . Codeine Itching  . Morphine Itching    Review of Systems  Constitutional: Negative for chills and fever.  HENT: Negative for congestion, sinus pressure, sinus pain and sore throat.   Eyes: Negative for pain and discharge.  Respiratory: Negative for cough and shortness of breath.   Cardiovascular: Negative for chest pain and palpitations.  Gastrointestinal: Negative for abdominal pain, constipation, diarrhea, nausea and vomiting.  Endocrine: Negative for polydipsia and polyuria.  Genitourinary: Negative for dysuria and hematuria.  Musculoskeletal: Positive for back pain. Negative for gait problem, neck pain and neck stiffness.  Skin: Negative for rash.  Neurological: Negative for dizziness, syncope, weakness and numbness.  Psychiatric/Behavioral: Negative for agitation and behavioral problems.       Objective:    Physical Exam Vitals reviewed.  Constitutional:      General: She is not in acute distress.    Appearance: She is obese. She is not diaphoretic.  HENT:     Head: Normocephalic and atraumatic.     Nose: Nose normal.     Mouth/Throat:     Mouth: Mucous membranes are moist.  Eyes:     General: No scleral icterus.     Extraocular Movements: Extraocular movements intact.     Pupils: Pupils are equal, round, and reactive to light.  Cardiovascular:     Rate and Rhythm: Normal rate and regular rhythm.     Pulses: Normal pulses.     Heart sounds: Normal heart sounds. No murmur heard.   Pulmonary:     Breath sounds: Normal breath sounds. No wheezing or rales.  Abdominal:     Palpations: Abdomen is soft.     Tenderness: There is no abdominal tenderness.  Musculoskeletal:     Cervical back: Neck supple. No tenderness.     Lumbar back: No swelling or signs of trauma.     Right lower leg: No edema.     Left lower leg: No edema.     Comments: Painful ROM at the hip  Skin:    General: Skin is warm.     Findings: No rash.  Neurological:     General: No focal deficit present.     Mental Status: She is alert and oriented to person, place, and time.     Sensory: No sensory deficit.     Motor: No weakness.  Psychiatric:        Mood and Affect: Mood normal.        Behavior: Behavior normal.     BP 122/78   Pulse 86   Resp 18   Ht 5\' 1"  (1.549 m)   Wt 204 lb (92.5 kg)   SpO2 98%   BMI 38.55 kg/m  Wt Readings from Last 3 Encounters:  10/30/19 204 lb (92.5 kg)  09/23/19 206 lb (93.4 kg)  08/20/19 197 lb (89.4 kg)    Health  Maintenance Due  Topic Date Due  . Hepatitis C Screening  Never done  . PAP SMEAR-Modifier  07/29/2019    There are no preventive care reminders to display for this patient.   Lab Results  Component Value Date   TSH 0.92 09/12/2017   Lab Results  Component Value Date   WBC 8.1 01/15/2019   HGB 15.3 (H) 01/15/2019   HCT 46.9 (H) 01/15/2019   MCV 92.7 01/15/2019   PLT 270 01/15/2019   Lab Results  Component Value Date   NA 138 01/15/2019   K 2.9 (L) 01/15/2019   CO2 29 01/15/2019   GLUCOSE 88 01/15/2019   BUN 13 01/15/2019   CREATININE 0.74 01/15/2019   BILITOT 0.8 01/15/2019   ALKPHOS 85 01/15/2019   AST 23 01/15/2019   ALT 19 01/15/2019   PROT 8.4 (H)  01/15/2019   ALBUMIN 4.3 01/15/2019   CALCIUM 9.5 01/15/2019   ANIONGAP 11 01/15/2019   Lab Results  Component Value Date   CHOL 213 (H) 07/10/2018   Lab Results  Component Value Date   HDL 59 07/10/2018   Lab Results  Component Value Date   LDLCALC 132 (H) 07/10/2018   Lab Results  Component Value Date   TRIG 108 07/10/2018   Lab Results  Component Value Date   CHOLHDL 3.6 07/10/2018   Lab Results  Component Value Date   HGBA1C 5.4 09/12/2017       Assessment & Plan:   Problem List Items Addressed This Visit      Nervous and Auditory Musculoskeletal and Integument Spondylolisthesis of lumbar region - Primary   Back pain with left-sided radiculopathy S/p fusion surgery Acute on chronic lumbar back pain, no inciting event Back exercises advised, material provided Toradol injection given Physical therapy referral   Relevant Orders   Ambulatory referral to Physical Therapy            Other Visit Diagnoses    Obesity (BMI 30-39.9) Emphasized the importance of weight loss for management of HTN and chronic back pain On Saxenda Diet modification and moderate exercise/walking, at least 150 mins/week          No orders of the defined types were placed in this encounter.    Lindell Spar, MD

## 2019-10-30 NOTE — Telephone Encounter (Signed)
Patient Stephanie Cannon stating she was having back pain and wanted injections. Scheduled patient to see Dr.Patel today at 1:40

## 2019-10-30 NOTE — Addendum Note (Signed)
Addended by: Lonn Georgia on: 10/30/2019 02:51 PM   Modules accepted: Orders

## 2019-11-04 ENCOUNTER — Encounter: Payer: Self-pay | Admitting: Family Medicine

## 2019-11-13 ENCOUNTER — Other Ambulatory Visit: Payer: Self-pay

## 2019-11-13 ENCOUNTER — Encounter (HOSPITAL_COMMUNITY): Payer: Self-pay | Admitting: Physical Therapy

## 2019-11-13 ENCOUNTER — Encounter: Payer: Self-pay | Admitting: Family Medicine

## 2019-11-13 ENCOUNTER — Ambulatory Visit (HOSPITAL_COMMUNITY): Payer: BC Managed Care – PPO | Attending: Family Medicine | Admitting: Physical Therapy

## 2019-11-13 DIAGNOSIS — M545 Low back pain, unspecified: Secondary | ICD-10-CM

## 2019-11-13 DIAGNOSIS — R262 Difficulty in walking, not elsewhere classified: Secondary | ICD-10-CM | POA: Insufficient documentation

## 2019-11-13 DIAGNOSIS — G8929 Other chronic pain: Secondary | ICD-10-CM

## 2019-11-13 NOTE — Therapy (Signed)
[facility] [address] [place], [place], [address] Phone: [phone]   Fax:  [phone]  Physical Therapy Evaluation  Patient Details  Name: Stephanie Cannon MRN: [id number] Date of Birth: 1970/03/02 Referring Provider (PT): [name], MD    Encounter Date: 11/13/2019   PT End of Session - 11/13/19 0910    Visit Number 1    Number of Visits 8    Date for PT Re-Evaluation 01/08/20    Authorization Type [place] - no copay, no auth no VL, BCBS COMM PPO - no auth and no VL    PT Start Time 0912    PT Stop Time 0950    PT Time Calculation (min) 38 min    Activity Tolerance Patient tolerated treatment well    Behavior During Therapy [facility] for tasks assessed/performed           Past Medical History:  Diagnosis Date   Arthritis    Asthma    Phreesia 07/31/2019   Breast discharge    left spont white discharge x 6 months   Carpal tunnel syndrome    left, wears brace at night   Chronic back pain    GERD (gastroesophageal reflux disease)    Hyperlipidemia    Hypertension    Obesity     Past Surgical History:  Procedure Laterality Date   ABDOMINAL HYSTERECTOMY  2003   partial   BREAST BIOPSY Left 2018   benign   CARPAL TUNNEL RELEASE Right    CESAREAN SECTION  1988   CESAREAN SECTION N/A    Phreesia 07/31/2019   CHOLECYSTECTOMY     COLONOSCOPY N/A 09/21/2012   Procedure: COLONOSCOPY;  Surgeon: Danie Binder, MD;  [facility];  Service: Endoscopy;  Laterality: N/A;  9:30   LAPAROSCOPIC GASTRIC SLEEVE RESECTION WITH HIATAL HERNIA REPAIR N/A 06/23/2014   Procedure: LAPAROSCOPIC LYSIS OF ADHESIONS, GASTRIC SLEEVE RESECTION AND UPPER ENDO;  Surgeon: Alphonsa Overall, MD;  [facility];  Service: General;  Laterality: N/A;   LAPAROSCOPIC LYSIS OF ADHESIONS  06/23/2014   Procedure: LAPAROSCOPIC LYSIS OF ADHESIONS;  Surgeon: Alphonsa Overall, MD;  Location: [facility];  Service: General;;    LUMBAR FUSION  01/22/2014   L4 L5       DR Arnoldo Morale   other     Vocal chord polyp removal   ovarian tumor  2005   ovaries removed    SPINE SURGERY N/A    Phreesia 07/31/2019   TONSILLECTOMY     UPPER GI ENDOSCOPY  06/23/2014   Procedure: UPPER GI ENDOSCOPY;  Surgeon: Alphonsa Overall, MD;  [facility]: [facility];  Service: General;;    There were no vitals filed for this visit.    Subjective Assessment - 11/13/19 1036    Subjective States she had back issues since the 90s. States she has had a history of lumbar surgery with fusion states she had a chronic pain in back since then but overall she felt better after her surgery. States she has episodes of flare ups in her back which is what is happening now. Reports no fall or injury her back just started hurting more and she has to take medications to help it (gabapentin, flexoril).  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 States she had PT prior to the back surgery. States she didnt have therapy after surgery.  Current pain is 6/10 described as nagging and throbbing. States standing and laying down at  night trying to rest is the worse. States that she sleeps on her side (with a pillow between her knees) and has difficulties staying asleep. States that she wakes up a couple times a night and has to take medication to make it through the night    Pertinent History lumbar fusion L4/5 2016    How long can you stand comfortably? 30 minutes    Patient Stated Goals to be able to do more in her house (cleaning)    Currently in Pain? Yes    Pain Score 6     Pain Location Back    Pain Orientation Lower;Mid    Pain Descriptors / Indicators Throbbing;Nagging    Pain Type Chronic pain    Pain Onset More than a month ago    Pain Frequency Constant    Aggravating Factors  stranding and sleeping              [facility] PT Assessment - 11/13/19 0001      Assessment   Medical Diagnosis low back pain    Referring Provider (PT) [name], MD     Prior Therapy yes prior to lumbar surgery years ago      Precautions   Precautions None      Balance Screen   Has the patient fallen in the past 6 months No      Prior Function   Level of Independence Independent      Cognition   Overall Cognitive Status Within Functional Limits for tasks assessed      Observation/Other Assessments   Focus on Therapeutic Outcomes (FOTO)  next session       ROM / Strength   AROM / PROM / Strength AROM;Strength      AROM   Overall AROM Comments hip ROM WNL except hip extension 5 degrees on left     AROM Assessment Site Lumbar    Lumbar Flexion 100% limited    Lumbar Extension 100% limited   aching across back    Lumbar - Right Side Bend 50% limited    Lumbar - Left Side Bend 100% limited   very stiff (this is the side she had the sciatica)     Strength   Strength Assessment Site Hip;Knee    Right/Left Hip Right;Left    Right Hip Flexion 5/5    Right Hip Extension 4/5    Right Hip ABduction 4/5   pain in low back    Left Hip Flexion 5/5   pain in back in center and lower   Left Hip Extension 4/5    Left Hip ABduction 4/5    Right/Left Knee Left;Right    Right Knee Flexion 4+/5    Left Knee Flexion 4+/5    Left Knee Extension 5/5      Flexibility   Soft Tissue Assessment /Muscle Length --   hamstrings WNL     Palpation   Spinal mobility hypomobility noted with spring testing in lumbar spine    Palpation comment tenderness along sacral base, no other tender spots noted      Special Tests   Other special tests negative SLR and slump test; ely's + bilaterally                       Objective measurements completed on examination: See above findings.       [facility] Adult PT Treatment/Exercise - 11/13/19 0001      Exercises   Exercises Lumbar  Lumbar Exercises: Stretches   Other Lumbar Stretch Exercise seated lumbar flexion, x5 10" holds, lumbar side bending x5 10" holds B, rotation x5 10" holds B                    PT Education - 11/13/19 0957    Education Details on plan, HEP and focus of therapy    Person(s) Educated Patient    Methods Explanation    Comprehension Verbalized understanding            PT Short Term Goals - 11/13/19 1000      PT SHORT TERM GOAL #1   Title Patient will demonstrate 75% or less limitation in lumbar ROM to demonstrate improved lumbar mobility    Time 4    Period Weeks    Status New    Target Date 12/11/19      PT SHORT TERM GOAL #2   Title Patient will be independent in self management strategies to improve quality of life and functional outcomes.    Time 4    Period Weeks    Status New    Target Date 12/11/19             PT Long Term Goals - 11/13/19 1000      PT LONG TERM GOAL #1   Title Patient will report waking up 1 or less times at night secondary to pain to demonstrate improved sleep quality.    Time 8    Period Weeks    Status New    Target Date 01/08/20      PT LONG TERM GOAL #2   Title Patient will report being able to stand for at least 60 minutes at a time to improve ability to clean her house.    Time 8    Period Weeks    Status New    Target Date 01/08/20      PT LONG TERM GOAL #3   Title Patient will report at least 50% improvement in overall symptoms and/or function to demonstrate improved functional mobility    Time 8    Period Weeks    Status New    Target Date 01/08/20                  Plan - 11/13/19 0954    Clinical Impression Statement Patient presents to therapy with chronic low back pain with new episode of increased  low back pain. Patient has history of lumbar fusion in 2016 and unsure if PT will help with current symptoms. Patient presents with limited ROM and strength and has poor sleep quality secondary to pain. Patient would greatly benefit form skilled physical therapy to improve QOL and return patient to optimal function.    Personal Factors and Comorbidities Comorbidity 1     Comorbidities lumbar fusion L4/5 in 2016    Examination-Activity Limitations Sleep;Stand    Stability/Clinical Decision Making Stable/Uncomplicated    Clinical Decision Making Low    Rehab Potential Good    PT Frequency Other (comment)   1-2x/week for total of 8 visits over 8 week certification   PT Duration 8 weeks    PT Treatment/Interventions ADLs/Self Care Home Management;Aquatic Therapy;Moist Heat;Traction;Iontophoresis 4mg /ml Dexamethasone;Electrical Stimulation;Cryotherapy;Gait training;Stair training;Functional mobility training;Therapeutic activities;Therapeutic exercise;Manual techniques;Patient/family education;Neuromuscular re-education;Joint Manipulations    PT Next Visit Plan FOTO, assess work setup (patient to bring in pictures), lumbar mobility, quad lengthening, core and functional/LE strengthening to improve standing endurance    PT Home Exercise Plan seated  stretches lumbar flexion, rotation, side bending.    Consulted and Agree with Plan of Care Patient           Patient will benefit from skilled therapeutic intervention in order to improve the following deficits and impairments:  Pain, Decreased strength, Decreased mobility, Decreased range of motion, Difficulty walking, Decreased activity tolerance  Visit Diagnosis: Chronic midline low back pain without sciatica  Difficulty in walking, not elsewhere classified     Problem List Patient Active Problem List   Diagnosis Date Noted   Back pain with left-sided radiculopathy 08/20/2019   Wheeze 07/31/2019   Cough 07/31/2019   Neck pain 06/18/2019   Superficial inflammatory acne vulgaris 07/14/2018   Chronic left flank pain 07/10/2018   Urinary frequency 01/23/2018   Knee pain, left anterior 11/01/2017   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   Spondylolisthesis of lumbar region 01/22/2014   Urinary incontinence without sensory awareness 07/19/2013   Accessory skin tags 04/03/2013    Prediabetes 31/43/8887   Metabolic syndrome X 57/97/2820   Hot 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 [place], [place], [address] Phone: [phone]   Fax:  [phone]  Name: Jenipher Havel MRN: [id number] Date of Birth: 11-Oct-1970

## 2019-11-13 NOTE — Patient Instructions (Signed)
Access Code: [email or web address] URL: [email or web address] Date: 11/13/2019 Prepared by: Yetta Glassman  Exercises Seated Lumbar Flexion Stretch - 3-5 x daily - 7 x weekly - 1 sets - 5 reps - 5-10 hold Seated Sidebending - 3-5 x daily - 7 x weekly - 1 sets - 5 reps - 5-10 hold Seated Trunk Rotation - Arms Crossed - 3-5 x daily - 7 x weekly - 1 sets - 5 reps - 5-10 hold

## 2019-11-15 ENCOUNTER — Encounter: Payer: Self-pay | Admitting: Family Medicine

## 2019-11-18 ENCOUNTER — Other Ambulatory Visit: Payer: Self-pay

## 2019-11-18 ENCOUNTER — Telehealth (INDEPENDENT_AMBULATORY_CARE_PROVIDER_SITE_OTHER): Payer: BC Managed Care – PPO | Admitting: Internal Medicine

## 2019-11-18 ENCOUNTER — Encounter: Payer: Self-pay | Admitting: Internal Medicine

## 2019-11-18 DIAGNOSIS — F419 Anxiety disorder, unspecified: Secondary | ICD-10-CM | POA: Diagnosis not present

## 2019-11-18 DIAGNOSIS — F5105 Insomnia due to other mental disorder: Secondary | ICD-10-CM | POA: Diagnosis not present

## 2019-11-18 DIAGNOSIS — F409 Phobic anxiety disorder, unspecified: Secondary | ICD-10-CM | POA: Diagnosis not present

## 2019-11-18 MED ORDER — FLUOXETINE HCL 20 MG PO TABS: 20.0000 mg | ORAL_TABLET | Freq: Every day | ORAL | 1 refills | Status: DC

## 2019-11-18 NOTE — Progress Notes (Signed)
Virtual Visit via Telephone Note   This visit type was conducted due to national recommendations for restrictions regarding the COVID-19 Pandemic (e.g. social distancing) in an effort to limit this patient's exposure and mitigate transmission in our community.  Due to her co-morbid illnesses, this patient is at least at moderate risk for complications without adequate follow up.  This format is felt to be most appropriate for this patient at this time.  The patient did not have access to video technology/had technical difficulties with video requiring transitioning to audio format only (telephone).  All issues noted in this document were discussed and addressed.  No physical exam could be performed with this format.  Evaluation Performed:  Follow-up visit  Date:  11/18/2019   ID:  Stephanie Cannon, DOB 1970/10/16, MRN [id number]  Patient Location: Home Provider Location: Office/Clinic  Location of Patient: Home Location of Provider: Telehealth Consent was obtain for visit to be over via telehealth. I verified that I am speaking with the correct person using two identifiers.  PCP:  Fayrene Helper, MD   Chief Complaint:  Anxiety  History of Present Illness:    Stephanie Cannon is a 49 y.o. female with PMH of HTN, HLD, GERD, chronic low back pain and anxiety has a televisit to discuss about her anxiety.  She states that she has been feeling anxious throughout the day since she has stopped taking Fluoxetine. She was unable to get the refill from the pharmacy. She denies anhedonia or suicidal ideation. She denies any change in appetite or weight. She takes Xanax 1 mg PRN. She also mentions of insomnia and sleeps only around 3 hours at night.  The patient does not have symptoms concerning for COVID-19 infection (fever, chills, cough, or new shortness of breath).   Past Medical, Surgical, Social History, Allergies, and Medications have been Reviewed.  Past Medical History:  Diagnosis  Date  . Arthritis   . Asthma    Phreesia 07/31/2019  . Breast discharge    left spont white discharge x 6 months  . Carpal tunnel syndrome    left, wears brace at night  . Chronic back pain   . GERD (gastroesophageal reflux disease)   . Hyperlipidemia   . Hypertension   . Obesity    Past Surgical History:  Procedure Laterality Date  . ABDOMINAL HYSTERECTOMY  2003   partial  . BREAST BIOPSY Left 2018   benign  . CARPAL TUNNEL RELEASE Right   . CESAREAN SECTION  1988  . CESAREAN SECTION N/A    Phreesia 07/31/2019  . CHOLECYSTECTOMY    . COLONOSCOPY N/A 09/21/2012   Procedure: COLONOSCOPY;  Surgeon: Danie Binder, MD;  [facility];  Service: Endoscopy;  Laterality: N/A;  9:30  . LAPAROSCOPIC GASTRIC SLEEVE RESECTION WITH HIATAL HERNIA REPAIR N/A 06/23/2014   Procedure: LAPAROSCOPIC LYSIS OF ADHESIONS, GASTRIC SLEEVE RESECTION AND UPPER ENDO;  Surgeon: Alphonsa Overall, MD;  [facility]: [facility];  Service: General;  Laterality: N/A;  . LAPAROSCOPIC LYSIS OF ADHESIONS  06/23/2014   Procedure: LAPAROSCOPIC LYSIS OF ADHESIONS;  Surgeon: Alphonsa Overall, MD;  Location: [facility];  Service: General;;  . LUMBAR FUSION  01/22/2014   L4 L5       DR Arnoldo Morale  . other     Vocal chord polyp removal  . 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 SPINE SURGERY N/A    Phreesia 07/31/2019  . TONSILLECTOMY    . UPPER GI  ENDOSCOPY  06/23/2014   Procedure: UPPER GI ENDOSCOPY;  Surgeon: Alphonsa Overall, MD;  [facility];  Service: General;;     No outpatient medications have been marked as taking for the 11/18/19 encounter (Video Visit) with Lindell Spar, MD.     Allergies:   Codeine and Morphine   ROS:   Please see the history of present illness.    Review of Systems  Constitutional: Negative for chills and fever.  HENT: Negative for congestion, ear pain, sinus pain and sore throat.   Eyes: Negative for pain and redness.  Respiratory: Negative for cough and shortness of breath.    Cardiovascular: Negative for chest pain and palpitations.  Gastrointestinal: Negative for constipation, diarrhea, nausea and vomiting.  Genitourinary: Negative for dysuria and hematuria.  Musculoskeletal: Negative for falls and neck pain.  Neurological: Negative for dizziness, sensory change, focal weakness and seizures.  Psychiatric/Behavioral: Negative for depression and suicidal ideas. The patient is nervous/anxious and has insomnia.      Labs/Other Tests and Data Reviewed:    Recent Labs: 01/15/2019: ALT 19; BUN 13; Creatinine, Ser 0.74; Hemoglobin 15.3; Platelets 270; Potassium 2.9; Sodium 138   Recent Lipid Panel Lab Results  Component Value Date/Time   CHOL 213 (H) 07/10/2018 12:41 PM   TRIG 108 07/10/2018 12:41 PM   HDL 59 07/10/2018 12:41 PM   CHOLHDL 3.6 07/10/2018 12:41 PM   LDLCALC 132 (H) 07/10/2018 12:41 PM    Wt Readings from Last 3 Encounters:  10/30/19 204 lb (92.5 kg)  09/23/19 206 lb (93.4 kg)  08/20/19 197 lb (89.4 kg)      ASSESSMENT & PLAN:    Generalized anxiety disorder Prescribed Fluoxetine 20 mg QD Xanax 1 mg QHS PRN Engage in activities of liking  Insomnia Xanax PRN Simple sleep hygiene advised  Time:   Today, I have spent 15 minutes with the patient with telehealth technology discussing the above problems.     Medication Adjustments/Labs and Tests Ordered: Current medicines are reviewed at length with the patient today.  Concerns regarding medicines are outlined above.   Tests Ordered: No orders of the defined types were placed in this encounter.   Medication Changes: No orders of the defined types were placed in this encounter.    Note: This dictation was prepared with Dragon dictation along with smaller phrase technology. Similar sounding words can be transcribed inadequately or may not be corrected upon review. Any transcriptional errors that result from this process are unintentional.      Disposition:  Follow up   Signed, Lindell Spar, MD  11/18/2019 3:57 PM     [place]

## 2019-11-18 NOTE — Telephone Encounter (Signed)
Pt made a phone visit to discuss with Dr Posey Pronto 11-18-19

## 2019-11-18 NOTE — Patient Instructions (Signed)
Please start taking Fluoxetine regularly. It takes up to 6 weeks for it to show the optimum benefit. Please do not discontinue taking it without discussing with [place].  Please take Xanax only as needed. Try to avoid taking it daily.  Please maintain simple sleep hygiene. - Maintain dark and non-noisy environment in the bedroom. - Please use the bedroom for sleep and sexual activity only. - Do not use electronic devices in the bedroom. - Please take dinner at least 2 hours before bedtime. - Please avoid caffeinated products in the evening, including coffee, soft drinks. - Please try to maintain the regular sleep-wake cycle - Go to bed and wake up at the same time.

## 2019-11-19 ENCOUNTER — Encounter: Payer: Self-pay | Admitting: Family Medicine

## 2019-11-20 ENCOUNTER — Ambulatory Visit (HOSPITAL_COMMUNITY): Payer: BC Managed Care – PPO | Admitting: Physical Therapy

## 2019-11-20 ENCOUNTER — Encounter: Payer: Self-pay | Admitting: Family Medicine

## 2019-11-20 ENCOUNTER — Encounter (HOSPITAL_COMMUNITY): Payer: Self-pay | Admitting: Physical Therapy

## 2019-11-20 ENCOUNTER — Other Ambulatory Visit: Payer: Self-pay

## 2019-11-20 ENCOUNTER — Ambulatory Visit: Payer: BC Managed Care – PPO | Attending: Family

## 2019-11-20 DIAGNOSIS — G8929 Other chronic pain: Secondary | ICD-10-CM

## 2019-11-20 DIAGNOSIS — Z23 Encounter for immunization: Secondary | ICD-10-CM

## 2019-11-20 DIAGNOSIS — M545 Low back pain, unspecified: Secondary | ICD-10-CM

## 2019-11-20 DIAGNOSIS — R262 Difficulty in walking, not elsewhere classified: Secondary | ICD-10-CM

## 2019-11-20 NOTE — Therapy (Signed)
[place], [place], [address] Phone: [phone]   Fax:  [phone]  Physical Therapy Treatment  Patient Details  Name: Stephanie Cannon MRN: [id number] Date of Birth: [birth year] Referring Provider (PT): [name], MD    Encounter Date: 11/20/2019   PT End of Session - 11/20/19 1740    Visit Number 2    Number of Visits 8    Date for PT Re-Evaluation 01/08/20    Authorization Type [place] - no copay, no auth no VL, [employer] COMM PPO - no auth and no VL    PT Start Time 1735    PT Stop Time 1815    PT Time Calculation (min) 40 min    Activity Tolerance Patient tolerated treatment well    Behavior During Therapy [facility] for tasks assessed/performed           Past Medical History:  Diagnosis Date  . Arthritis   . Asthma    Phreesia 07/31/2019  . Breast discharge    left spont white discharge x 6 months  . Carpal tunnel syndrome    left, wears brace at night  . Chronic back pain   . GERD (gastroesophageal reflux disease)   . Hyperlipidemia   . Hypertension   . Obesity     Past Surgical History:  Procedure Laterality Date  . ABDOMINAL HYSTERECTOMY  2003   partial  . BREAST BIOPSY Left 2018   benign  . CARPAL TUNNEL RELEASE Right   . CESAREAN SECTION  1988  . CESAREAN SECTION N/A    Phreesia 07/31/2019  . CHOLECYSTECTOMY    . COLONOSCOPY N/A 09/21/2012   Procedure: COLONOSCOPY;  Surgeon: Danie Binder, MD;  [facility];  Service: Endoscopy;  Laterality: N/A;  9:30  . LAPAROSCOPIC GASTRIC SLEEVE RESECTION WITH HIATAL HERNIA REPAIR N/A 06/23/2014   Procedure: LAPAROSCOPIC LYSIS OF ADHESIONS, GASTRIC SLEEVE RESECTION AND UPPER ENDO;  Surgeon: Alphonsa Overall, MD;  [facility];  Service: General;  Laterality: N/A;  . LAPAROSCOPIC LYSIS OF ADHESIONS  06/23/2014   Procedure: LAPAROSCOPIC LYSIS OF ADHESIONS;  Surgeon: Alphonsa Overall, MD;  [facility];  Service: General;;  . LUMBAR  FUSION  01/22/2014   L4 L5       DR Arnoldo Morale  . other     Vocal chord polyp removal  . 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 SPINE SURGERY N/A    Phreesia 07/31/2019  . TONSILLECTOMY    . UPPER GI ENDOSCOPY  06/23/2014   Procedure: UPPER GI ENDOSCOPY;  Surgeon: Alphonsa Overall, MD;  [facility];  Service: General;;    There were no vitals filed for this visit.   Subjective Assessment - 11/20/19 1739    Subjective Patient says she is "ok", having some stiffness going on. Says RT side is not as bad as LT.    Pertinent History lumbar fusion L4/5 2016    How long can you stand comfortably? 30 minutes    Patient Stated Goals to be able to do more in her house (cleaning)    Currently in Pain? Yes    Pain Score 5     Pain Location Back    Pain Orientation Lower;Mid    Pain Descriptors / Indicators Tightness    Pain Type Chronic pain    Pain Onset More than a month ago              [facility]  PT Assessment - 11/20/19 0001      Observation/Other Assessments   Focus on Therapeutic Outcomes (FOTO)  62% limited                          [facility] Adult PT Treatment/Exercise - 11/20/19 0001      Lumbar Exercises: Stretches   Lower Trunk Rotation 5 reps;10 seconds    Piriformis Stretch Right;Left;3 reps;30 seconds      Lumbar Exercises: Seated   Other Seated Lumbar Exercises lumbar flexion 5 x 5", sidebending 5x5"       Lumbar Exercises: Supine   Ab Set 10 reps;5 seconds    Bridge 10 reps;5 seconds                    PT Short Term Goals - 11/13/19 1000      PT SHORT TERM GOAL #1   Title Patient will demonstrate 75% or less limitation in lumbar ROM to demonstrate improved lumbar mobility    Time 4    Period Weeks    Status New    Target Date 12/11/19      PT SHORT TERM GOAL #2   Title Patient will be independent in self management strategies to improve quality of life and functional outcomes.    Time 4    Period Weeks    Status New    Target  Date 12/11/19             PT Long Term Goals - 11/13/19 1000      PT LONG TERM GOAL #1   Title Patient will report waking up 1 or less times at night secondary to pain to demonstrate improved sleep quality.    Time 8    Period Weeks    Status New    Target Date 01/08/20      PT LONG TERM GOAL #2   Title Patient will report being able to stand for at least 60 minutes at a time to improve ability to clean her house.    Time 8    Period Weeks    Status New    Target Date 01/08/20      PT LONG TERM GOAL #3   Title Patient will report at least 50% improvement in overall symptoms and/or function to demonstrate improved functional mobility    Time 8    Period Weeks    Status New    Target Date 01/08/20                 Plan - 11/20/19 1822    Clinical Impression Statement Patient tolerated session well overall. Patient presents with ongoing AROM limitations which are negatively impacting function and contributing to pain. Completed FOTO, reviewed therapy goals and HEP. Initiated ther ex program with focus on progressing lumbar mobility and core strengthening. Patient educated on proper form and function of all added activity. Also reviewed patient photos of work station. Advised patient on modifications that may improve ergonomic in work place. Patient educated on and issued HEP handout, with modifications for completion at home and/or work.    Personal Factors and Comorbidities Comorbidity 1    Comorbidities lumbar fusion L4/5 in 2016    Examination-Activity Limitations Sleep;Stand    Stability/Clinical Decision Making Stable/Uncomplicated    Rehab Potential Good    PT Frequency Other (comment)   1-2x/week for total of 8 visits over 8 week certification   PT Duration 8 weeks  PT Treatment/Interventions ADLs/Self Care Home Management;Aquatic Therapy;Moist Heat;Traction;Iontophoresis 4mg /ml Dexamethasone;Electrical Stimulation;Cryotherapy;Gait training;Stair  training;Functional mobility training;Therapeutic activities;Therapeutic exercise;Manual techniques;Patient/family education;Neuromuscular re-education;Joint Manipulations    PT Next Visit Plan Continue to progress lumbar mobility, quad lengthening, core and functional/LE strengthening to improve standing endurance    PT Home Exercise Plan seated stretches lumbar flexion, rotation, side bending. 11/20/19: ab set, LTR, piriformis strecth, bridge    Consulted and Agree with Plan of Care Patient           Patient will benefit from skilled therapeutic intervention in order to improve the following deficits and impairments:  Pain, Decreased strength, Decreased mobility, Decreased range of motion, Difficulty walking, Decreased activity tolerance  Visit Diagnosis: Chronic midline low back pain without sciatica  Difficulty in walking, not elsewhere classified     Problem List Patient Active Problem List   Diagnosis Date Noted  . Back pain with left-sided radiculopathy 08/20/2019  . Wheeze 07/31/2019  . Cough 07/31/2019  . Neck pain 06/18/2019  . Superficial inflammatory acne vulgaris 07/14/2018  . Chronic left flank pain 07/10/2018  . Urinary frequency 01/23/2018  . Knee pain, left anterior 11/01/2017  . Dysphonia 07/05/2016  . Vocal fold polyp 07/05/2016  . 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 Spondylolisthesis of lumbar region 01/22/2014  . Urinary incontinence without sensory awareness 07/19/2013  . Accessory skin tags 04/03/2013  . Prediabetes 12/17/2012  . Metabolic syndrome X 02/58/5277  . Hot flashes due to surgical menopause 12/06/2011  . Anxiety 03/01/2011  . Hyperlipidemia LDL goal <100 07/08/2010  . HEMORRHOIDS 04/27/2009  . CARPAL TUNNEL SYNDROME 06/26/2007  . Essential hypertension 06/26/2007  . GERD 06/26/2007  . Spasm of muscle of lower back 06/26/2007    6:25 PM, 11/20/19 Josue Hector PT DPT  Physical Therapist with [facility]  [phone] [place] [address] [place], [place], [address] Phone: [phone]   Fax:  [phone]  Name: Stephanie Cannon MRN: [id number] Date of Birth: Oct 28, 1970

## 2019-11-20 NOTE — Patient Instructions (Signed)
Access Code: [email or web address] URL: [email or web address] Date: 11/20/2019 Prepared by: Josue Hector  Exercises Supine Transversus Abdominis Bracing - Hands on Stomach - 2 x daily - 7 x weekly - 1-2 sets - 10 reps - 5 seconds hold Supine Lower Trunk Rotation - 2 x daily - 7 x weekly - 1 sets - 5 reps - 10 seconds hold Supine Piriformis Stretch with Foot on Ground - 2 x daily - 7 x weekly - 1 sets - 3 reps - 30 seconds hold Supine Bridge - 2 x daily - 7 x weekly - 1-2 sets - 10 reps - seconds hold

## 2019-11-21 ENCOUNTER — Telehealth: Payer: Self-pay

## 2019-11-21 NOTE — Telephone Encounter (Signed)
Patient called wanting the FMLA papers that were faxed to the company on 08/07/19. I printed a copy for her, placed in an envelope and left in the brown folder up front. Patient stated that her mother or father is allowed to pick them up. Mother is Delle Reining.

## 2019-11-26 ENCOUNTER — Ambulatory Visit (HOSPITAL_COMMUNITY): Payer: BC Managed Care – PPO

## 2019-12-02 ENCOUNTER — Other Ambulatory Visit: Payer: Self-pay | Admitting: Family Medicine

## 2019-12-03 ENCOUNTER — Ambulatory Visit (HOSPITAL_COMMUNITY): Payer: BC Managed Care – PPO

## 2019-12-03 ENCOUNTER — Telehealth (HOSPITAL_COMMUNITY): Payer: Self-pay

## 2019-12-03 NOTE — Telephone Encounter (Signed)
pt cancelled appt for today because she is having car trouble

## 2019-12-05 ENCOUNTER — Ambulatory Visit: Payer: BC Managed Care – PPO | Admitting: Family Medicine

## 2019-12-10 ENCOUNTER — Telehealth (HOSPITAL_COMMUNITY): Payer: Self-pay | Admitting: Physical Therapy

## 2019-12-10 ENCOUNTER — Ambulatory Visit (HOSPITAL_COMMUNITY): Payer: BC Managed Care – PPO | Admitting: Physical Therapy

## 2019-12-10 NOTE — Telephone Encounter (Signed)
Lack of transportation please cx today 12/7

## 2019-12-17 ENCOUNTER — Ambulatory Visit (HOSPITAL_COMMUNITY): Payer: BC Managed Care – PPO | Admitting: Physical Therapy

## 2019-12-24 ENCOUNTER — Telehealth (HOSPITAL_COMMUNITY): Payer: Self-pay | Admitting: Physical Therapy

## 2019-12-24 ENCOUNTER — Ambulatory Visit (HOSPITAL_COMMUNITY): Payer: BC Managed Care – PPO | Admitting: Physical Therapy

## 2019-12-24 NOTE — Telephone Encounter (Signed)
pt cancelled appt on the phone tree, I called and verified that she does want to cancel

## 2019-12-26 ENCOUNTER — Encounter: Payer: Self-pay | Admitting: Nurse Practitioner

## 2019-12-26 ENCOUNTER — Telehealth (INDEPENDENT_AMBULATORY_CARE_PROVIDER_SITE_OTHER): Payer: BC Managed Care – PPO | Admitting: Nurse Practitioner

## 2019-12-26 ENCOUNTER — Other Ambulatory Visit: Payer: Self-pay

## 2019-12-26 ENCOUNTER — Encounter: Payer: Self-pay | Admitting: Family Medicine

## 2019-12-26 DIAGNOSIS — R197 Diarrhea, unspecified: Secondary | ICD-10-CM

## 2019-12-26 MED ORDER — LOPERAMIDE HCL 2 MG PO CAPS: 2.0000 mg | ORAL_CAPSULE | ORAL | 0 refills | Status: DC | PRN

## 2019-12-26 NOTE — Assessment & Plan Note (Signed)
-  she thinks her diarrhea came from recalled salad -symptoms started 5 days ago -she is able to drink liquids without a BM; just has BMs after eating solid foods -she states no abdominal pain when she presses on her abdomen -Rx. Imodium -discussed BRAT diet -discussed taking probiotic yogurt after she can tolerate BRAT foods

## 2019-12-26 NOTE — Telephone Encounter (Signed)
Pt made an appt with Pearline Cables

## 2019-12-26 NOTE — Progress Notes (Signed)
Acute Office Visit  Subjective:    Patient ID: Stephanie Cannon, female    DOB: 23-Nov-1970, 49 y.o.   MRN: [id number]  Chief Complaint  Patient presents with  . Diarrhea    X 5 days; ate recalled salad.     Diarrhea  Pertinent negatives include no abdominal pain or vomiting.   Patient is in today for diarrhea. She had 2 BMs today and is having BMs right after eating.  She states the stools are very loose, but not watery.  She has been drinking ginger ale without diarrhea or vomiting.  She has had some cramping.  Denies N/V, fever, or abdominal pain.  Past Medical History:  Diagnosis Date  . Arthritis   . Asthma    Phreesia 07/31/2019  . Breast discharge    left spont white discharge x 6 months  . Carpal tunnel syndrome    left, wears brace at night  . Chronic back pain   . GERD (gastroesophageal reflux disease)   . Hyperlipidemia   . Hypertension   . Obesity     Past Surgical History:  Procedure Laterality Date  . ABDOMINAL HYSTERECTOMY  2003   partial  . BREAST BIOPSY Left 2018   benign  . CARPAL TUNNEL RELEASE Right   . CESAREAN SECTION  1988  . CESAREAN SECTION N/A    Phreesia 07/31/2019  . CHOLECYSTECTOMY    . COLONOSCOPY N/A 09/21/2012   Procedure: COLONOSCOPY;  Surgeon: Danie Binder, MD;  [facility];  Service: Endoscopy;  Laterality: N/A;  9:30  . LAPAROSCOPIC GASTRIC SLEEVE RESECTION WITH HIATAL HERNIA REPAIR N/A 06/23/2014   Procedure: LAPAROSCOPIC LYSIS OF ADHESIONS, GASTRIC SLEEVE RESECTION AND UPPER ENDO;  Surgeon: Alphonsa Overall, MD;  [facility];  Service: General;  Laterality: N/A;  . LAPAROSCOPIC LYSIS OF ADHESIONS  06/23/2014   Procedure: LAPAROSCOPIC LYSIS OF ADHESIONS;  Surgeon: Alphonsa Overall, MD;  [facility];  Service: General;;  . LUMBAR FUSION  01/22/2014   L4 L5       DR Arnoldo Morale  . other     Vocal chord polyp removal  . 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 SPINE SURGERY N/A    Phreesia 07/31/2019  .  TONSILLECTOMY    . UPPER GI ENDOSCOPY  06/23/2014   Procedure: UPPER GI ENDOSCOPY;  Surgeon: Alphonsa Overall, MD;  [facility];  Service: General;;    Family History  Problem Relation Age of Onset  . Hypertension Mother   . Hypertension Father   . Hypertension Brother   . Cancer Maternal Aunt   . Cancer Maternal Uncle        liver  . Cancer Paternal Uncle        bone cancer  . Diabetes Maternal Grandmother   . Colon cancer Neg Hx     Social History   Socioeconomic History  . Marital status: Married    Spouse name: Not on file  . Number of children: Not on file  . Years of education: Not on file  . Highest education level: Not on file  Occupational History  . 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 Smoking status: Former Smoker    Packs/day: 0.75    Years: 17.00    Pack years: 12.75    Types: Cigarettes    Quit date: 01/03/2009    Years since quitting: 10.9  . Smokeless  tobacco: Never Used  Vaping Use  . Vaping Use: Never used  Substance and Sexual Activity  . Alcohol use: Yes    Comment: occasionally  . Drug use: No  . Sexual activity: Not on file  Other Topics Concern  . Not on file  Social History Narrative  . Not on file   Social Determinants of Health   Financial Resource Strain: Not on file  Food Insecurity: Not on file  Transportation Needs: Not on file  Physical Activity: Not on file  Stress: Not on file  Social Connections: Not on file  Intimate Partner Violence: Not on file    Outpatient Medications Prior to Visit  Medication Sig Dispense Refill  . ALPRAZolam (XANAX) 1 MG tablet TAKE (1) TABLET BY MOUTH AT BEDTIME. 30 tablet 0  . cyclobenzaprine (FLEXERIL) 10 MG tablet TAKE 1 TABLET BY MOUTH AT BEDTIME. 90 tablet 0  . esomeprazole (NEXIUM) 40 MG capsule TAKE ONE CAPSULE BY MOUTH ONCE DAILY. 90 capsule 0  . FLUoxetine (PROZAC) 20 MG tablet Take 1 tablet (20 mg total) by mouth daily. 90  tablet 1  . gabapentin (NEURONTIN) 300 MG capsule Take one to two capsules at bedtime  For back pain 60 capsule 5  . ibuprofen (ADVIL) 800 MG tablet Take 1 tablet (800 mg total) by mouth every 8 (eight) hours as needed. 30 tablet 0  . Multiple Vitamins-Minerals (HAIR SKIN & NAILS ADVANCED) TABS Take 1 tablet by mouth daily.    . Multiple Vitamins-Minerals (MULTIVITAMIN GUMMIES ADULT PO) Take 1 each by mouth 2 (two) times daily.    . potassium chloride (KLOR-CON) 10 MEQ tablet TAKE ONE TABLET BY MOUTH ONCE DAILY. 90 tablet 0  . SAXENDA 18 MG/3ML SOPN Inject 3 mg into the skin daily.     Marland Kitchen triamterene-hydrochlorothiazide (MAXZIDE) 75-50 MG tablet TAKE 1 TABLET BY MOUTH ONCE DAILY. 90 tablet 0  . potassium chloride SA (KLOR-CON) 20 MEQ tablet Take 1 tablet (20 mEq total) by mouth 2 (two) times daily for 5 days. 10 tablet 0   No facility-administered medications prior to visit.    Allergies  Allergen Reactions  . Codeine Itching  . Morphine Itching    Review of Systems  Constitutional: Negative.   Respiratory: Negative.   Cardiovascular: Negative.   Gastrointestinal: Positive for diarrhea. Negative for abdominal distention, abdominal pain, constipation, nausea, rectal pain and vomiting.       Objective:    Physical Exam  There were no vitals taken for this visit. Wt Readings from Last 3 Encounters:  10/30/19 204 lb (92.5 kg)  09/23/19 206 lb (93.4 kg)  08/20/19 197 lb (89.4 kg)    Health Maintenance Due  Topic Date Due  . Hepatitis C Screening  Never done  . COVID-19 Vaccine (3 - Moderna risk 4-dose series) 05/14/2019  . PAP SMEAR-Modifier  07/29/2019    There are no preventive care reminders to display for this patient.   Lab Results  Component Value Date   TSH 0.92 09/12/2017   Lab Results  Component Value Date   WBC 8.1 01/15/2019   HGB 15.3 (H) 01/15/2019   HCT 46.9 (H) 01/15/2019   MCV 92.7 01/15/2019   PLT 270 01/15/2019   Lab Results  Component Value  Date   NA 138 01/15/2019   K 2.9 (L) 01/15/2019   CO2 29 01/15/2019   GLUCOSE 88 01/15/2019   BUN 13 01/15/2019   CREATININE 0.74 01/15/2019   BILITOT 0.8 01/15/2019   ALKPHOS 85  01/15/2019   AST 23 01/15/2019   ALT 19 01/15/2019   PROT 8.4 (H) 01/15/2019   ALBUMIN 4.3 01/15/2019   CALCIUM 9.5 01/15/2019   ANIONGAP 11 01/15/2019   Lab Results  Component Value Date   CHOL 213 (H) 07/10/2018   Lab Results  Component Value Date   HDL 59 07/10/2018   Lab Results  Component Value Date   LDLCALC 132 (H) 07/10/2018   Lab Results  Component Value Date   TRIG 108 07/10/2018   Lab Results  Component Value Date   CHOLHDL 3.6 07/10/2018   Lab Results  Component Value Date   HGBA1C 5.4 09/12/2017       Assessment & Plan:   Problem List Items Addressed This Visit      Other   Diarrhea - Primary    -she thinks her diarrhea came from recalled salad -symptoms started 5 days ago -she is able to drink liquids without a BM; just has BMs after eating solid foods -she states no abdominal pain when she presses on her abdomen -Rx. Imodium -discussed BRAT diet -discussed taking probiotic yogurt after she can tolerate BRAT foods          Meds ordered this encounter  Medications  . loperamide (IMODIUM A-D) 2 MG capsule    Sig: Take 1 capsule (2 mg total) by mouth as needed for diarrhea or loose stools (take 2 capsules now, then 1 capsuleafter each loose stool; Max 5 capsule in a 24-hour period).    Dispense:  30 capsule    Refill:  0   Date:  12/26/2019   Location of Patient: Home Location of Provider: Office Consent was obtain for visit to be over via telehealth. I verified that I am speaking with the correct person using two identifiers.  I connected with  Lajuana Ripple on 12/26/19 via telephone and verified that I am speaking with the correct person using two identifiers.   I discussed the limitations of evaluation and management by telemedicine. The patient  expressed understanding and agreed to proceed.  Time spent: 9 minutes   Noreene Larsson, NP

## 2019-12-30 ENCOUNTER — Ambulatory Visit (INDEPENDENT_AMBULATORY_CARE_PROVIDER_SITE_OTHER): Payer: BC Managed Care – PPO | Admitting: Nurse Practitioner

## 2019-12-30 ENCOUNTER — Encounter: Payer: Self-pay | Admitting: Family Medicine

## 2019-12-30 ENCOUNTER — Other Ambulatory Visit: Payer: Self-pay | Admitting: Family Medicine

## 2019-12-30 ENCOUNTER — Encounter: Payer: Self-pay | Admitting: Nurse Practitioner

## 2019-12-30 ENCOUNTER — Other Ambulatory Visit: Payer: Self-pay

## 2019-12-30 VITALS — BP 141/74 | HR 70 | Temp 98.4°F | Resp 20 | Ht 61.0 in | Wt 207.0 lb

## 2019-12-30 DIAGNOSIS — E785 Hyperlipidemia, unspecified: Secondary | ICD-10-CM | POA: Diagnosis not present

## 2019-12-30 DIAGNOSIS — M62830 Muscle spasm of back: Secondary | ICD-10-CM | POA: Diagnosis not present

## 2019-12-30 DIAGNOSIS — Z23 Encounter for immunization: Secondary | ICD-10-CM

## 2019-12-30 DIAGNOSIS — F419 Anxiety disorder, unspecified: Secondary | ICD-10-CM | POA: Diagnosis not present

## 2019-12-30 NOTE — Patient Instructions (Signed)
You received the first dose of Shingrix today. We will see you back in 2 months for the second and last dose.  Please get your fasting labs done, and we can meet back up for management of chronic conditions.  There are pending refills already placed for flexeril and xanax, so you should be able to pick them up when they are due.

## 2019-12-30 NOTE — Progress Notes (Signed)
Acute Office Visit  Subjective:    Patient ID: Stephanie Cannon, female    DOB: [birth year], 49 y.o.   MRN: [id number]  Chief Complaint  Patient presents with  . Follow-up    Medication management     HPI Patient is in today for chronic care follow-up. She was supposed to have fasting labs for this appointment, but she did not have those drawn.  She needs refills on xanax as well as flexeril.  She has back surgery in the past and has ongoing chronic back pain.  She would like to get the Shingrix shot today.  Past Medical History:  Diagnosis Date  . Arthritis   . Asthma    Phreesia 07/31/2019  . Breast discharge    left spont white discharge x 6 months  . Carpal tunnel syndrome    left, wears brace at night  . Chronic back pain   . GERD (gastroesophageal reflux disease)   . Hyperlipidemia   . Hypertension   . Obesity     Past Surgical History:  Procedure Laterality Date  . ABDOMINAL HYSTERECTOMY  2003   partial  . BREAST BIOPSY Left 2018   benign  . CARPAL TUNNEL RELEASE Right   . CESAREAN SECTION  1988  . CESAREAN SECTION N/A    Phreesia 07/31/2019  . CHOLECYSTECTOMY    . COLONOSCOPY N/A 09/21/2012   Procedure: COLONOSCOPY;  Surgeon: Danie Binder, MD;  [facility];  Service: Endoscopy;  Laterality: N/A;  9:30  . LAPAROSCOPIC GASTRIC SLEEVE RESECTION WITH HIATAL HERNIA REPAIR N/A 06/23/2014   Procedure: LAPAROSCOPIC LYSIS OF ADHESIONS, GASTRIC SLEEVE RESECTION AND UPPER ENDO;  Surgeon: Alphonsa Overall, MD;  Location: [facility];  Service: General;  Laterality: N/A;  . LAPAROSCOPIC LYSIS OF ADHESIONS  06/23/2014   Procedure: LAPAROSCOPIC LYSIS OF ADHESIONS;  Surgeon: Alphonsa Overall, MD;  Location: [facility];  Service: General;;  . LUMBAR FUSION  01/22/2014   L4 L5       DR Arnoldo Morale  . other     Vocal chord polyp removal  . 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 SPINE SURGERY N/A    Phreesia 07/31/2019  . TONSILLECTOMY    . UPPER GI ENDOSCOPY  06/23/2014    Procedure: UPPER GI ENDOSCOPY;  Surgeon: Alphonsa Overall, MD;  Location: [facility];  Service: General;;    Family History  Problem Relation Age of Onset  . Hypertension Mother   . Hypertension Father   . Hypertension Brother   . Cancer Maternal Aunt   . Cancer Maternal Uncle        liver  . Cancer Paternal Uncle        bone cancer  . Diabetes Maternal Grandmother   . Colon cancer Neg Hx     Social History   Socioeconomic History  . Marital status: Married    Spouse name: Not on file  . Number of children: Not on file  . Years of education: Not on file  . Highest education level: Not on file  Occupational History  . 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 Smoking status: Former Smoker    Packs/day: 0.75    Years: 17.00    Pack years: 12.75    Types: Cigarettes    Quit date: 01/03/2009    Years since quitting: 10.9  . Smokeless tobacco: Never Used  Vaping Use  . Vaping  Use: Never used  Substance and Sexual Activity  . Alcohol use: Yes    Comment: occasionally  . Drug use: No  . Sexual activity: Not on file  Other Topics Concern  . Not on file  Social History Narrative  . Not on file   Social Determinants of Health   Financial Resource Strain: Not on file  Food Insecurity: Not on file  Transportation Needs: Not on file  Physical Activity: Not on file  Stress: Not on file  Social Connections: Not on file  Intimate Partner Violence: Not on file    Outpatient Medications Prior to Visit  Medication Sig Dispense Refill  . ALPRAZolam (XANAX) 1 MG tablet TAKE (1) TABLET BY MOUTH AT BEDTIME. 30 tablet 0  . cyclobenzaprine (FLEXERIL) 10 MG tablet TAKE 1 TABLET BY MOUTH AT BEDTIME. 90 tablet 0  . esomeprazole (NEXIUM) 40 MG capsule TAKE ONE CAPSULE BY MOUTH ONCE DAILY. 90 capsule 0  . FLUoxetine (PROZAC) 20 MG tablet Take 1 tablet (20 mg total) by mouth daily. 90 tablet 1  . ibuprofen (ADVIL) 800 MG tablet Take 1  tablet (800 mg total) by mouth every 8 (eight) hours as needed. 30 tablet 0  . loperamide (IMODIUM A-D) 2 MG capsule Take 1 capsule (2 mg total) by mouth as needed for diarrhea or loose stools (take 2 capsules now, then 1 capsuleafter each loose stool; Max 5 capsule in a 24-hour period). 30 capsule 0  . Multiple Vitamins-Minerals (HAIR SKIN & NAILS ADVANCED) TABS Take 1 tablet by mouth daily.    . Multiple Vitamins-Minerals (MULTIVITAMIN GUMMIES ADULT PO) Take 1 each by mouth 2 (two) times daily.    . potassium chloride (KLOR-CON) 10 MEQ tablet TAKE ONE TABLET BY MOUTH ONCE DAILY. 90 tablet 0  . SAXENDA 18 MG/3ML SOPN Inject 3 mg into the skin daily.     Marland Kitchen triamterene-hydrochlorothiazide (MAXZIDE) 75-50 MG tablet TAKE 1 TABLET BY MOUTH ONCE DAILY. 90 tablet 0  . gabapentin (NEURONTIN) 300 MG capsule Take one to two capsules at bedtime  For back pain 60 capsule 5  . potassium chloride SA (KLOR-CON) 20 MEQ tablet Take 1 tablet (20 mEq total) by mouth 2 (two) times daily for 5 days. 10 tablet 0   No facility-administered medications prior to visit.    Allergies  Allergen Reactions  . Codeine Itching  . Morphine Itching    Review of Systems  Constitutional: Negative.   Respiratory: Negative.   Cardiovascular: Negative.   Musculoskeletal: Positive for back pain.  Psychiatric/Behavioral: Negative.        Objective:    Physical Exam Constitutional:      Appearance: She is obese.  Cardiovascular:     Rate and Rhythm: Normal rate and regular rhythm.     Pulses: Normal pulses.     Heart sounds: Normal heart sounds.  Pulmonary:     Effort: Pulmonary effort is normal.     Breath sounds: Normal breath sounds.  Neurological:     Mental Status: She is alert.  Psychiatric:        Mood and Affect: Mood normal.        Behavior: Behavior normal.        Thought Content: Thought content normal.        Judgment: Judgment normal.     BP (!) 141/74   Pulse 70   Temp 98.4 F (36.9 C)    Resp 20   Ht 5\' 1"  (1.549 m)   Wt 207 lb (  93.9 kg)   SpO2 97%   BMI 39.11 kg/m  Wt Readings from Last 3 Encounters:  12/30/19 207 lb (93.9 kg)  10/30/19 204 lb (92.5 kg)  09/23/19 206 lb (93.4 kg)    Health Maintenance Due  Topic Date Due  . Hepatitis C Screening  Never done  . COVID-19 Vaccine (3 - Moderna risk 4-dose series) 05/14/2019  . PAP SMEAR-Modifier  07/29/2019    There are no preventive care reminders to display for this patient.   Lab Results  Component Value Date   TSH 0.92 09/12/2017   Lab Results  Component Value Date   WBC 8.1 01/15/2019   HGB 15.3 (H) 01/15/2019   HCT 46.9 (H) 01/15/2019   MCV 92.7 01/15/2019   PLT 270 01/15/2019   Lab Results  Component Value Date   NA 138 01/15/2019   K 2.9 (L) 01/15/2019   CO2 29 01/15/2019   GLUCOSE 88 01/15/2019   BUN 13 01/15/2019   CREATININE 0.74 01/15/2019   BILITOT 0.8 01/15/2019   ALKPHOS 85 01/15/2019   AST 23 01/15/2019   ALT 19 01/15/2019   PROT 8.4 (H) 01/15/2019   ALBUMIN 4.3 01/15/2019   CALCIUM 9.5 01/15/2019   ANIONGAP 11 01/15/2019   Lab Results  Component Value Date   CHOL 213 (H) 07/10/2018   Lab Results  Component Value Date   HDL 59 07/10/2018   Lab Results  Component Value Date   LDLCALC 132 (H) 07/10/2018   Lab Results  Component Value Date   TRIG 108 07/10/2018   Lab Results  Component Value Date   CHOLHDL 3.6 07/10/2018   Lab Results  Component Value Date   HGBA1C 5.4 09/12/2017       Assessment & Plan:   Problem List Items Addressed This Visit      Other   Spasm of muscle of lower back    -has been taking flexeril PRN for spasms, and it is helping -has pending reorder for flexeril today      Anxiety - Primary    -well controlled with xanax -has pending reorder for xanax today       Immunization due    -shingrix- 1st dose- administered today          No orders of the defined types were placed in this encounter.    Noreene Larsson, NP

## 2019-12-30 NOTE — Assessment & Plan Note (Addendum)
-  has been taking flexeril PRN for spasms, and it is helping -has pending reorder for flexeril today

## 2019-12-30 NOTE — Assessment & Plan Note (Signed)
-  shingrix- 1st dose- administered today

## 2019-12-30 NOTE — Assessment & Plan Note (Addendum)
-  well controlled with xanax -has pending reorder for xanax today

## 2019-12-31 ENCOUNTER — Ambulatory Visit (HOSPITAL_COMMUNITY): Payer: BC Managed Care – PPO

## 2019-12-31 ENCOUNTER — Telehealth (HOSPITAL_COMMUNITY): Payer: Self-pay

## 2019-12-31 NOTE — Telephone Encounter (Signed)
I don't recall any facial rash. If OTC hydrocortisone cream doesn't help, we should set up an appointment.

## 2019-12-31 NOTE — Telephone Encounter (Signed)
pt cancelled appt for today, no reason given 

## 2020-01-02 ENCOUNTER — Other Ambulatory Visit: Payer: Self-pay

## 2020-01-02 MED ORDER — CYCLOBENZAPRINE HCL 10 MG PO TABS
10.0000 mg | ORAL_TABLET | Freq: Every day | ORAL | 0 refills | Status: DC
Start: 2020-01-02 — End: 2020-02-06

## 2020-01-02 NOTE — Telephone Encounter (Signed)
It didn't let me send this in at her appointment because she had a refill pending with Dr. Lodema Hong. I don't think it was due at the time of the appointment and the pharmacy will send in the refill request.

## 2020-01-02 NOTE — Telephone Encounter (Signed)
I have refilled flexeril but she is asking for her xanax refill.

## 2020-01-02 NOTE — Telephone Encounter (Signed)
Can you forward this to Casimiro Needle, he actually had a visit with her this week and discussed anxiety during that visit. Thank you

## 2020-01-06 ENCOUNTER — Telehealth (HOSPITAL_COMMUNITY): Payer: Self-pay | Admitting: Physical Therapy

## 2020-01-06 NOTE — Telephone Encounter (Signed)
s/w patient she wants to cx this apptment - no reason given and she states she will call back to r/s at a later date

## 2020-01-07 ENCOUNTER — Ambulatory Visit (HOSPITAL_COMMUNITY): Payer: BC Managed Care – PPO | Admitting: Physical Therapy

## 2020-01-09 NOTE — Telephone Encounter (Signed)
Pt requesting xanax refill.

## 2020-01-10 ENCOUNTER — Other Ambulatory Visit: Payer: Self-pay | Admitting: Family Medicine

## 2020-01-10 MED ORDER — ALPRAZOLAM 1 MG PO TABS: ORAL_TABLET | ORAL | 3 refills | Status: DC

## 2020-02-06 ENCOUNTER — Other Ambulatory Visit: Payer: Self-pay

## 2020-02-06 ENCOUNTER — Telehealth (INDEPENDENT_AMBULATORY_CARE_PROVIDER_SITE_OTHER): Payer: BC Managed Care – PPO | Admitting: Family Medicine

## 2020-02-06 ENCOUNTER — Other Ambulatory Visit: Payer: Self-pay | Admitting: Family Medicine

## 2020-02-06 ENCOUNTER — Encounter: Payer: Self-pay | Admitting: Family Medicine

## 2020-02-06 VITALS — Ht 61.0 in | Wt 200.0 lb

## 2020-02-06 DIAGNOSIS — F5105 Insomnia due to other mental disorder: Secondary | ICD-10-CM

## 2020-02-06 DIAGNOSIS — F419 Anxiety disorder, unspecified: Secondary | ICD-10-CM

## 2020-02-06 DIAGNOSIS — I10 Essential (primary) hypertension: Secondary | ICD-10-CM | POA: Diagnosis not present

## 2020-02-06 DIAGNOSIS — E785 Hyperlipidemia, unspecified: Secondary | ICD-10-CM | POA: Diagnosis not present

## 2020-02-06 DIAGNOSIS — F32A Depression, unspecified: Secondary | ICD-10-CM

## 2020-02-06 DIAGNOSIS — M5410 Radiculopathy, site unspecified: Secondary | ICD-10-CM

## 2020-02-06 DIAGNOSIS — F411 Generalized anxiety disorder: Secondary | ICD-10-CM

## 2020-02-06 DIAGNOSIS — K219 Gastro-esophageal reflux disease without esophagitis: Secondary | ICD-10-CM

## 2020-02-06 DIAGNOSIS — M62830 Muscle spasm of back: Secondary | ICD-10-CM

## 2020-02-06 DIAGNOSIS — Z23 Encounter for immunization: Secondary | ICD-10-CM

## 2020-02-06 DIAGNOSIS — F409 Phobic anxiety disorder, unspecified: Secondary | ICD-10-CM

## 2020-02-06 NOTE — Patient Instructions (Addendum)
F/U in 6 months, call if you need me sooner  Fasting labs are overdue, plase get them in the next 1 week, new order to be entered with this visit please, fasting CBC, lipid, cmp and EGFr, TSH, vit D and hepatitis C screen  No medication changes  It is important that you exercise regularly at least 30 minutes 5 times a week. If you develop chest pain, have severe difficulty breathing, or feel very tired, stop exercising immediately and seek medical attention  Think about what you will eat, plan ahead. Choose " clean, green, fresh or frozen" over canned, processed or packaged foods which are more sugary, salty and fatty. 70 to 75% of food eaten should be vegetables and fruit. Three meals at set times with snacks allowed between meals, but they must be fruit or vegetables. Aim to eat over a 12 hour period , example 7 am to 7 pm, and STOP after  your last meal of the day. Drink water,generally about 64 ounces per day, no other drink is as healthy. Fruit juice is best enjoyed in a healthy way, by EATING the fruit.   Thanks for choosing [facility], we consider it a privelige to serve you.

## 2020-02-06 NOTE — Progress Notes (Signed)
Virtual Visit via Telephone Note  I connected with Stephanie Cannon on 02/06/20 at  3:40 PM EST by telephone and verified that I am speaking with the correct person using two identifiers.  Location: Patient: work Provider: office   I discussed the limitations, risks, security and privacy concerns of performing an evaluation and management service by telephone and the availability of in person appointments. I also discussed with the patient that there may be a patient responsible charge related to this service. The patient expressed understanding and agreed to proceed.   History of Present Illness: F/U chronic problems and address any new or current concerns. Review and update medications and allergies. Review recent lab and radiologic data . Update routine health maintainace. Review an encourage improved health habits to include nutrition, exercise and  sleep .  Denies recent fever or chills. Denies sinus pressure, nasal congestion, ear pain or sore throat. Denies chest congestion, productive cough or wheezing. Denies chest pains, palpitations and leg swelling Denies abdominal pain, nausea, vomiting,diarrhea or constipation.   Denies dysuria, frequency, hesitancy or incontinence. Denies uncontrolled  joint pain, swelling and limitation in mobility. Denies headaches, seizures, numbness, or tingling. Denies uncontrolled depression, anxiety or insomnia. Denies skin break down or rash.       Observations/Objective: Ht 5\' 1"  (1.549 m)   Wt 200 lb (90.7 kg)   BMI 37.79 kg/m  Good communication with no confusion and intact memory. Alert and oriented x 3 No signs of respiratory distress during speech    Assessment and Plan:  Insomnia due to anxiety and fear Controlled, no change in medication   Essential hypertension Controlled, no change in medication DASH diet and commitment to daily physical activity for a minimum of 30 minutes discussed and encouraged, as a part of  hypertension management. The importance of attaining a healthy weight is also discussed.  BP/Weight 02/06/2020 12/30/2019 10/30/2019 09/23/2019 08/20/2019 7/90/2409 07/06/5327  Systolic BP - 924 268 341 962 229 798  Diastolic BP - 74 78 80 84 68 85  Wt. (Lbs) 200 207 204 206 197 204.4 191  BMI 37.79 39.11 38.55 38.92 37.22 38.62 36.09  Some encounter information is confidential and restricted. Go to Review Flowsheets activity to see all data.       Hyperlipidemia LDL goal <100 Hyperlipidemia:Low fat diet discussed and encouraged.   Lipid Panel  Lab Results  Component Value Date   CHOL 213 (H) 07/10/2018   HDL 59 07/10/2018   LDLCALC 132 (H) 07/10/2018   TRIG 108 07/10/2018   CHOLHDL 3.6 07/10/2018   Updated lab needed   Immunization due Needs 2nd shingrix early March  Spasm of muscle of lower back Controlled, no change in medication   GERD (gastroesophageal reflux disease) Controlled, no change in medication   Morbid obesity ([place]) Improving, continue current med  Patient re-educated about  the importance of commitment to a  minimum of 150 minutes of exercise per week as able.  The importance of healthy food choices with portion control discussed, as well as eating regularly and within a 12 hour window most days. The need to choose "clean , green" food 50 to 75% of the time is discussed, as well as to make water the primary drink and set a goal of 64 ounces water daily.    Weight /BMI 02/06/2020 12/30/2019 10/30/2019  WEIGHT 200 lb 207 lb 204 lb  HEIGHT 5\' 1"  5\' 1"  5\' 1"   BMI 37.79 kg/m2 39.11 kg/m2 38.55 kg/m2  Some encounter information is  confidential and restricted. Go to Review Flowsheets activity to see all data.      Back pain with left-sided radiculopathy Bedtime muscle relaxant affords relief, continue same  Anxiety and depression Controlled, no change in medication    Follow Up Instructions:    I discussed the assessment and treatment plan with  the patient. The patient was provided an opportunity to ask questions and all were answered. The patient agreed with the plan and demonstrated an understanding of the instructions.   The patient was advised to call back or seek an in-person evaluation if the symptoms worsen or if the condition fails to improve as anticipated.  I provided 15  minutes of non-face-to-face time during this encounter.   [name], MD

## 2020-02-08 ENCOUNTER — Encounter: Payer: Self-pay | Admitting: Family Medicine

## 2020-02-08 ENCOUNTER — Telehealth: Payer: Self-pay | Admitting: Family Medicine

## 2020-02-08 DIAGNOSIS — F32A Depression, unspecified: Secondary | ICD-10-CM | POA: Insufficient documentation

## 2020-02-08 DIAGNOSIS — F411 Generalized anxiety disorder: Secondary | ICD-10-CM | POA: Insufficient documentation

## 2020-02-08 NOTE — Assessment & Plan Note (Signed)
Controlled, no change in medication  

## 2020-02-08 NOTE — Assessment & Plan Note (Signed)
Needs 2nd shingrix early March

## 2020-02-08 NOTE — Assessment & Plan Note (Signed)
Improving, continue current med  Patient re-educated about  the importance of commitment to a  minimum of 150 minutes of exercise per week as able.  The importance of healthy food choices with portion control discussed, as well as eating regularly and within a 12 hour window most days. The need to choose "clean , green" food 50 to 75% of the time is discussed, as well as to make water the primary drink and set a goal of 64 ounces water daily.    Weight /BMI 02/06/2020 12/30/2019 10/30/2019  WEIGHT 200 lb 207 lb 204 lb  HEIGHT 5\' 1"  5\' 1"  5\' 1"   BMI 37.79 kg/m2 39.11 kg/m2 38.55 kg/m2  Some encounter information is confidential and restricted. Go to Review Flowsheets activity to see all data.

## 2020-02-08 NOTE — Assessment & Plan Note (Signed)
Hyperlipidemia:Low fat diet discussed and encouraged.   Lipid Panel  Lab Results  Component Value Date   CHOL 213 (H) 07/10/2018   HDL 59 07/10/2018   LDLCALC 132 (H) 07/10/2018   TRIG 108 07/10/2018   CHOLHDL 3.6 07/10/2018   Updated lab needed

## 2020-02-08 NOTE — Assessment & Plan Note (Signed)
Bedtime muscle relaxant affords relief, continue same

## 2020-02-08 NOTE — Assessment & Plan Note (Signed)
Controlled, no change in medication DASH diet and commitment to daily physical activity for a minimum of 30 minutes discussed and encouraged, as a part of hypertension management. The importance of attaining a healthy weight is also discussed.  BP/Weight 02/06/2020 12/30/2019 10/30/2019 09/23/2019 08/20/2019 6/97/9480 01/09/5535  Systolic BP - 482 707 867 544 920 100  Diastolic BP - 74 78 80 84 68 85  Wt. (Lbs) 200 207 204 206 197 204.4 191  BMI 37.79 39.11 38.55 38.92 37.22 38.62 36.09  Some encounter information is confidential and restricted. Go to Review Flowsheets activity to see all data.

## 2020-02-08 NOTE — Telephone Encounter (Signed)
Pt needs shingrix # 2 scheduled in March, please call and arrange nurse visit for this, thanks [facility]

## 2020-02-21 NOTE — Progress Notes (Signed)
   Covid-19 Vaccination Clinic  Name:  Stephanie Cannon    MRN: [id number] DOB: 10/10/1970  02/21/2020  [name] was observed post Covid-19 immunization for 15 minutes without incident. She was provided with Vaccine Information Sheet and instruction to access the V-Safe system.   [name] was instructed to call 911 with any severe reactions post vaccine: Marland Kitchen Difficulty breathing  . Swelling of face and throat  . A fast heartbeat  . A bad rash all over body  . Dizziness and weakness   Immunizations Administered    Name Date Dose VIS Date Route   Moderna Covid-19 Booster Vaccine 11/20/2019 10:00 AM 0.25 mL 10/23/2019 Intramuscular   Manufacturer: Moderna   Lot: [id number]   [place]: 69507-225-75

## 2020-03-02 ENCOUNTER — Other Ambulatory Visit: Payer: Self-pay | Admitting: Family Medicine

## 2020-03-02 ENCOUNTER — Other Ambulatory Visit: Payer: Self-pay | Admitting: Internal Medicine

## 2020-03-02 DIAGNOSIS — F419 Anxiety disorder, unspecified: Secondary | ICD-10-CM

## 2020-03-02 NOTE — Telephone Encounter (Signed)
LVM for pt to call the office to set this appt up

## 2020-03-26 ENCOUNTER — Encounter: Payer: Self-pay | Admitting: Family Medicine

## 2020-03-31 ENCOUNTER — Other Ambulatory Visit: Payer: Self-pay

## 2020-03-31 ENCOUNTER — Telehealth (INDEPENDENT_AMBULATORY_CARE_PROVIDER_SITE_OTHER): Payer: BC Managed Care – PPO | Admitting: Internal Medicine

## 2020-03-31 ENCOUNTER — Encounter: Payer: Self-pay | Admitting: Internal Medicine

## 2020-03-31 ENCOUNTER — Encounter: Payer: Self-pay | Admitting: Family Medicine

## 2020-03-31 DIAGNOSIS — M545 Low back pain, unspecified: Secondary | ICD-10-CM

## 2020-03-31 DIAGNOSIS — G8929 Other chronic pain: Secondary | ICD-10-CM

## 2020-03-31 DIAGNOSIS — M62830 Muscle spasm of back: Secondary | ICD-10-CM

## 2020-03-31 MED ORDER — CYCLOBENZAPRINE HCL 10 MG PO TABS: 10.0000 mg | ORAL_TABLET | Freq: Every day | ORAL | 0 refills | Status: DC

## 2020-03-31 NOTE — Progress Notes (Signed)
Virtual Visit via Telephone Note   This visit type was conducted due to national recommendations for restrictions regarding the COVID-19 Pandemic (e.g. social distancing) in an effort to limit this patient's exposure and mitigate transmission in our community.  Due to her co-morbid illnesses, this patient is at least at moderate risk for complications without adequate follow up.  This format is felt to be most appropriate for this patient at this time.  The patient did not have access to video technology/had technical difficulties with video requiring transitioning to audio format only (telephone).  All issues noted in this document were discussed and addressed.  No physical exam could be performed with this format.  Please refer to the patient's chart for her  consent to telehealth for [facility].   Evaluation Performed:  Follow-up visit  Date:  03/31/2020   ID:  Stephanie Cannon, DOB January 12, 1970, MRN [id number]  Patient Location: Home Provider Location: Office/Clinic  Location of Patient: Home Location of Provider: Telehealth Consent was obtain for visit to be over via telehealth. I verified that I am speaking with the correct person using two identifiers.  PCP:  Stephanie Helper, MD   Chief Complaint:  Back pain  History of Present Illness:    Stephanie Cannon is a 50 y.o. female who has a televisit for c/o chronic back pain associated with muscle spasms. She denies any recent injury or lifting weight. Her pain is worse with movement and better with Flexeril. Denies any numbness or weakness of the LE.  She also had UA done at her workplace and has sent its results on MyChart. She denies any dysuria or hematuria. Denies vaginal discharge.  The patient does not have symptoms concerning for COVID-19 infection (fever, chills, cough, or new shortness of breath).   Past Medical, Surgical, Social History, Allergies, and Medications have been Reviewed.  Past Medical History:   Diagnosis Date  . Arthritis   . Asthma    Phreesia 07/31/2019  . Breast discharge    left spont white discharge x 6 months  . Carpal tunnel syndrome    left, wears brace at night  . Chronic back pain   . GERD (gastroesophageal reflux disease)   . Hyperlipidemia   . Hypertension   . Obesity    Past Surgical History:  Procedure Laterality Date  . ABDOMINAL HYSTERECTOMY  2003   partial  . BREAST BIOPSY Left 2018   benign  . CARPAL TUNNEL RELEASE Right   . CESAREAN SECTION  1988  . CESAREAN SECTION N/A    Phreesia 07/31/2019  . CHOLECYSTECTOMY    . COLONOSCOPY N/A 09/21/2012   Procedure: COLONOSCOPY;  Surgeon: Danie Binder, MD;  [facility];  Service: Endoscopy;  Laterality: N/A;  9:30  . LAPAROSCOPIC GASTRIC SLEEVE RESECTION WITH HIATAL HERNIA REPAIR N/A 06/23/2014   Procedure: LAPAROSCOPIC LYSIS OF ADHESIONS, GASTRIC SLEEVE RESECTION AND UPPER ENDO;  Surgeon: Alphonsa Overall, MD;  Location: [facility];  Service: General;  Laterality: N/A;  . LAPAROSCOPIC LYSIS OF ADHESIONS  06/23/2014   Procedure: LAPAROSCOPIC LYSIS OF ADHESIONS;  Surgeon: Alphonsa Overall, MD;  Location: [facility];  Service: General;;  . LUMBAR FUSION  01/22/2014   L4 L5       DR Arnoldo Morale  . other     Vocal chord polyp removal  . 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 SPINE SURGERY N/A    Phreesia 07/31/2019  . TONSILLECTOMY    . UPPER  GI ENDOSCOPY  06/23/2014   Procedure: UPPER GI ENDOSCOPY;  Surgeon: Alphonsa Overall, MD;  [facility]: [facility];  Service: General;;     Current Meds  Medication Sig  . ALPRAZolam (XANAX) 1 MG tablet Take one tablet by mouth once daily at bedtime, for anxiety  . cyclobenzaprine (FLEXERIL) 10 MG tablet TAKE 1 TABLET BY MOUTH AT BEDTIME.  Marland Kitchen esomeprazole (NEXIUM) 40 MG capsule TAKE ONE CAPSULE BY MOUTH ONCE DAILY.  Marland Kitchen FLUoxetine (PROZAC) 20 MG capsule TAKE 1 CAPSULE BY MOUTH ONCE A DAY.  Marland Kitchen ibuprofen (ADVIL) 800 MG tablet Take 1 tablet (800 mg total) by mouth every 8 (eight) hours  as needed.  . Multiple Vitamins-Minerals (HAIR SKIN & NAILS ADVANCED) TABS Take 1 tablet by mouth daily.  . Multiple Vitamins-Minerals (MULTIVITAMIN GUMMIES ADULT PO) Take 1 each by mouth 2 (two) times daily.  . potassium chloride (KLOR-CON) 10 MEQ tablet TAKE ONE TABLET BY MOUTH ONCE DAILY.  Marland Kitchen SAXENDA 18 MG/3ML SOPN Inject 3 mg into the skin daily.   Marland Kitchen triamterene-hydrochlorothiazide (MAXZIDE) 75-50 MG tablet TAKE 1 TABLET BY MOUTH ONCE DAILY.     Allergies:   Codeine and Morphine   ROS:   Please see the history of present illness.     All other systems reviewed and are negative.   Labs/Other Tests and Data Reviewed:    Recent Labs: No results found for requested labs within last 8760 hours.   Recent Lipid Panel Lab Results  Component Value Date/Time   CHOL 213 (H) 07/10/2018 12:41 PM   TRIG 108 07/10/2018 12:41 PM   HDL 59 07/10/2018 12:41 PM   CHOLHDL 3.6 07/10/2018 12:41 PM   LDLCALC 132 (H) 07/10/2018 12:41 PM    Wt Readings from Last 3 Encounters:  02/06/20 200 lb (90.7 kg)  12/30/19 207 lb (93.9 kg)  10/30/19 204 lb (92.5 kg)      ASSESSMENT & PLAN:    Chronic low back pain Prescribed Flexeril Simple back exercises Avoid lifting heavy weight  GERD On Nexium Advised to take plenty of water with potassium tablet  Time:   Today, I have spent 13 minutes reviewing the chart, including problem list, medications, and with the patient with telehealth technology discussing the above problems.   Medication Adjustments/Labs and Tests Ordered: Current medicines are reviewed at length with the patient today.  Concerns regarding medicines are outlined above.   Tests Ordered: No orders of the defined types were placed in this encounter.   Medication Changes: No orders of the defined types were placed in this encounter.    Note: This dictation was prepared with Dragon dictation along with smaller phrase technology. Similar sounding words can be transcribed  inadequately or may not be corrected upon review. Any transcriptional errors that result from this process are unintentional.      Disposition:  Follow up  Signed, Lindell Spar, MD  03/31/2020 8:50 AM     [place]

## 2020-03-31 NOTE — Patient Instructions (Signed)
Chronic Back Pain When back pain lasts longer than 3 months, it is called chronic back pain. Pain may get worse at certain times (flare-ups). There are things you can do at home to manage your pain. Follow these instructions at home: Pay attention to any changes in your symptoms. Take these actions to help with your pain: Managing pain and stiffness  If told, put ice on the painful area. Your doctor may tell you to use ice for 24-48 hours after the flare-up starts. To do this: ? Put ice in a plastic bag. ? Place a towel between your skin and the bag. ? Leave the ice on for 20 minutes, 2-3 times a day.  If told, put heat on the painful area. Do this as often as told by your doctor. Use the heat source that your doctor recommends, such as a moist heat pack or a heating pad. ? Place a towel between your skin and the heat source. ? Leave the heat on for 20-30 minutes. ? Take off the heat if your skin turns bright red. This is especially important if you are unable to feel pain, heat, or cold. You may have a greater risk of getting burned.  Soak in a warm bath. This can help relieve pain.      Activity  Avoid bending and other activities that make pain worse.  When standing: ? Keep your upper back and neck straight. ? Keep your shoulders pulled back. ? Avoid slouching.  When sitting: ? Keep your back straight. ? Relax your shoulders. Do not round your shoulders or pull them backward.  Do not sit or stand in one place for long periods of time.  Take short rest breaks during the day. Lying down or standing is usually better than sitting. Resting can help relieve pain.  When sitting or lying down for a long time, do some mild activity or stretching. This will help to prevent stiffness and pain.  Get regular exercise. Ask your doctor what activities are safe for you.  Do not lift anything that is heavier than 10 lb (4.5 kg) or the limit that you are told, until your doctor says that it  is safe.  To prevent injury when you lift things: ? Bend your knees. ? Keep the weight close to your body. ? Avoid twisting.  Sleep on a firm mattress. Try lying on your side with your knees slightly bent. If you lie on your back, put a pillow under your knees.   Medicines  Treatment may include medicines for pain and swelling taken by mouth or put on the skin, prescription pain medicine, or muscle relaxants.  Take over-the-counter and prescription medicines only as told by your doctor.  Ask your doctor if the medicine prescribed to you: ? Requires you to avoid driving or using machinery. ? Can cause trouble pooping (constipation). You may need to take these actions to prevent or treat trouble pooping:  Drink enough fluid to keep your pee (urine) pale yellow.  Take over-the-counter or prescription medicines.  Eat foods that are high in fiber. These include beans, whole grains, and fresh fruits and vegetables.  Limit foods that are high in fat and sugars. These include fried or sweet foods. General instructions  Do not use any products that contain nicotine or tobacco, such as cigarettes, e-cigarettes, and chewing tobacco. If you need help quitting, ask your doctor.  Keep all follow-up visits as told by your doctor. This is important. Contact a doctor if:    Your pain does not get better with rest or medicine.  Your pain gets worse, or you have new pain.  You have a high fever.  You lose weight very quickly.  You have trouble doing your normal activities. Get help right away if:  One or both of your legs or feet feel weak.  One or both of your legs or feet lose feeling (have numbness).  You have trouble controlling when you poop (have a bowel movement) or pee (urinate).  You have bad back pain and: ? You feel like you may vomit (nauseous), or you vomit. ? You have pain in your belly (abdomen). ? You have shortness of breath. ? You faint. Summary  When back pain  lasts longer than 3 months, it is called chronic back pain.  Pain may get worse at certain times (flare-ups).  Use ice and heat as told by your doctor. Your doctor may tell you to use ice after flare-ups. This information is not intended to replace advice given to you by your health care provider. Make sure you discuss any questions you have with your health care provider. Document Revised: 01/30/2019 Document Reviewed: 01/30/2019 Elsevier Patient Education  2021 Elsevier Inc.  

## 2020-04-02 ENCOUNTER — Encounter: Payer: Self-pay | Admitting: Family Medicine

## 2020-04-03 ENCOUNTER — Telehealth (INDEPENDENT_AMBULATORY_CARE_PROVIDER_SITE_OTHER): Payer: BC Managed Care – PPO | Admitting: Internal Medicine

## 2020-04-03 ENCOUNTER — Encounter: Payer: Self-pay | Admitting: Internal Medicine

## 2020-04-03 ENCOUNTER — Other Ambulatory Visit: Payer: Self-pay

## 2020-04-03 ENCOUNTER — Other Ambulatory Visit (HOSPITAL_COMMUNITY)
Admission: RE | Admit: 2020-04-03 | Discharge: 2020-04-03 | Disposition: A | Discharge status: Home / Self Care | Payer: BC Managed Care – PPO | Source: Ambulatory Visit | Attending: Internal Medicine | Admitting: Internal Medicine

## 2020-04-03 DIAGNOSIS — N3942 Incontinence without sensory awareness: Secondary | ICD-10-CM | POA: Diagnosis not present

## 2020-04-03 DIAGNOSIS — R350 Frequency of micturition: Secondary | ICD-10-CM | POA: Insufficient documentation

## 2020-04-03 DIAGNOSIS — N3000 Acute cystitis without hematuria: Secondary | ICD-10-CM | POA: Diagnosis not present

## 2020-04-03 DIAGNOSIS — B373 Candidiasis of vulva and vagina: Secondary | ICD-10-CM | POA: Insufficient documentation

## 2020-04-03 LAB — POCT URINALYSIS DIP (CLINITEK)
Glucose, UA: NEGATIVE mg/dL
Ketones, POC UA: NEGATIVE mg/dL
Nitrite, UA: NEGATIVE
POC PROTEIN,UA: 100 — AB
Spec Grav, UA: 1.025 (ref 1.010–1.025)
Urobilinogen, UA: 1 E.U./dL
pH, UA: 6 (ref 5.0–8.0)

## 2020-04-03 MED ORDER — SULFAMETHOXAZOLE-TRIMETHOPRIM 800-160 MG PO TABS: 1.0000 | ORAL_TABLET | Freq: Two times a day (BID) | ORAL | 0 refills | Status: DC

## 2020-04-03 NOTE — Progress Notes (Signed)
Virtual Visit via Telephone Note   This visit type was conducted due to national recommendations for restrictions regarding the COVID-19 Pandemic (e.g. social distancing) in an effort to limit this patient's exposure and mitigate transmission in our community.  Due to her co-morbid illnesses, this patient is at least at moderate risk for complications without adequate follow up.  This format is felt to be most appropriate for this patient at this time.  The patient did not have access to video technology/had technical difficulties with video requiring transitioning to audio format only (telephone).  All issues noted in this document were discussed and addressed.  No physical exam could be performed with this format.  Evaluation Performed:  Follow-up visit  Date:  04/03/2020   ID:  Stephanie Cannon, DOB 12/12/1970, MRN [id number]  Patient Location: Home Provider Location: Office/Clinic  Participants: Patient Location of Patient: Home Location of Provider: Telehealth Consent was obtain for visit to be over via telehealth. I verified that I am speaking with the correct person using two identifiers.  PCP:  Fayrene Helper, MD   Chief Complaint:  Dysuria  History of Present Illness:    Stephanie Cannon is a 50 y.o. female who has a televisit for c/o dysuria and urinary frequency for about a week. She recently had UA at her workplace which was negative for LE and nitrite. She still has dysuria and urinary frequency. Denies fever, chills, nausea or vomiting.  The patient does not have symptoms concerning for COVID-19 infection (fever, chills, cough, or new shortness of breath).   Past Medical, Surgical, Social History, Allergies, and Medications have been Reviewed.  Past Medical History:  Diagnosis Date  . Arthritis   . Asthma    Phreesia 07/31/2019  . Breast discharge    left spont white discharge x 6 months  . Carpal tunnel syndrome    left, wears brace at night  . Chronic back  pain   . GERD (gastroesophageal reflux disease)   . Hyperlipidemia   . Hypertension   . Obesity    Past Surgical History:  Procedure Laterality Date  . ABDOMINAL HYSTERECTOMY  2003   partial  . BREAST BIOPSY Left 2018   benign  . CARPAL TUNNEL RELEASE Right   . CESAREAN SECTION  1988  . CESAREAN SECTION N/A    Phreesia 07/31/2019  . CHOLECYSTECTOMY    . COLONOSCOPY N/A 09/21/2012   Procedure: COLONOSCOPY;  Surgeon: Danie Binder, MD;  Location: [facility];  Service: Endoscopy;  Laterality: N/A;  9:30  . LAPAROSCOPIC GASTRIC SLEEVE RESECTION WITH HIATAL HERNIA REPAIR N/A 06/23/2014   Procedure: LAPAROSCOPIC LYSIS OF ADHESIONS, GASTRIC SLEEVE RESECTION AND UPPER ENDO;  Surgeon: Alphonsa Overall, MD;  Location: [facility];  Service: General;  Laterality: N/A;  . LAPAROSCOPIC LYSIS OF ADHESIONS  06/23/2014   Procedure: LAPAROSCOPIC LYSIS OF ADHESIONS;  Surgeon: Alphonsa Overall, MD;  Location: [facility];  Service: General;;  . LUMBAR FUSION  01/22/2014   L4 L5       DR Arnoldo Morale  . other     Vocal chord polyp removal  . 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 SPINE SURGERY N/A    Phreesia 07/31/2019  . TONSILLECTOMY    . UPPER GI ENDOSCOPY  06/23/2014   Procedure: UPPER GI ENDOSCOPY;  Surgeon: Alphonsa Overall, MD;  Location: [facility];  Service: General;;     Current Meds  Medication Sig  . ALPRAZolam (XANAX) 1 MG tablet Take  one tablet by mouth once daily at bedtime, for anxiety  . cyclobenzaprine (FLEXERIL) 10 MG tablet Take 1 tablet (10 mg total) by mouth at bedtime.  Marland Kitchen esomeprazole (NEXIUM) 40 MG capsule TAKE ONE CAPSULE BY MOUTH ONCE DAILY.  Marland Kitchen FLUoxetine (PROZAC) 20 MG capsule TAKE 1 CAPSULE BY MOUTH ONCE A DAY.  Marland Kitchen ibuprofen (ADVIL) 800 MG tablet Take 1 tablet (800 mg total) by mouth every 8 (eight) hours as needed.  . Multiple Vitamins-Minerals (HAIR SKIN & NAILS ADVANCED) TABS Take 1 tablet by mouth daily.  . Multiple Vitamins-Minerals (MULTIVITAMIN GUMMIES ADULT PO) Take 1 each by mouth 2  (two) times daily.  . potassium chloride (KLOR-CON) 10 MEQ tablet TAKE ONE TABLET BY MOUTH ONCE DAILY.  Marland Kitchen SAXENDA 18 MG/3ML SOPN Inject 3 mg into the skin daily.   Marland Kitchen sulfamethoxazole-trimethoprim (BACTRIM DS) 800-160 MG tablet Take 1 tablet by mouth 2 (two) times daily.  Marland Kitchen triamterene-hydrochlorothiazide (MAXZIDE) 75-50 MG tablet TAKE 1 TABLET BY MOUTH ONCE DAILY.     Allergies:   Codeine and Morphine   ROS:   Please see the history of present illness.     All other systems reviewed and are negative.   Labs/Other Tests and Data Reviewed:    Recent Labs: No results found for requested labs within last 8760 hours.   Recent Lipid Panel Lab Results  Component Value Date/Time   CHOL 213 (H) 07/10/2018 12:41 PM   TRIG 108 07/10/2018 12:41 PM   HDL 59 07/10/2018 12:41 PM   CHOLHDL 3.6 07/10/2018 12:41 PM   LDLCALC 132 (H) 07/10/2018 12:41 PM    Wt Readings from Last 3 Encounters:  02/06/20 200 lb (90.7 kg)  12/30/19 207 lb (93.9 kg)  10/30/19 204 lb (92.5 kg)      ASSESSMENT & PLAN:    UTI UA reviewed, positive for LE and RBC Check urine culture Check vaginal swab as she mentions it was positive at her workplace, but no detail available Start Bactrim Increase water intake  Time:   Today, I have spent 9 minutes reviewing the chart, including problem list, medications, and with the patient with telehealth technology discussing the above problems.   Medication Adjustments/Labs and Tests Ordered: Current medicines are reviewed at length with the patient today.  Concerns regarding medicines are outlined above.   Tests Ordered: Orders Placed This Encounter  Procedures  . Urine Culture  . POCT URINALYSIS DIP (CLINITEK)    Medication Changes: Meds ordered this encounter  Medications  . sulfamethoxazole-trimethoprim (BACTRIM DS) 800-160 MG tablet    Sig: Take 1 tablet by mouth 2 (two) times daily.    Dispense:  10 tablet    Refill:  0     Note: This dictation was  prepared with Dragon dictation along with smaller phrase technology. Similar sounding words can be transcribed inadequately or may not be corrected upon review. Any transcriptional errors that result from this process are unintentional.      Disposition:  Follow up  Signed, Lindell Spar, MD  04/03/2020 10:26 AM     [facility]

## 2020-04-03 NOTE — Patient Instructions (Signed)
Urinary Tract Infection, Adult A urinary tract infection (UTI) is an infection of any part of the urinary tract. The urinary tract includes:  The kidneys.  The ureters.  The bladder.  The urethra. These organs make, store, and get rid of pee (urine) in the body. What are the causes? This infection is caused by germs (bacteria) in your genital area. These germs grow and cause swelling (inflammation) of your urinary tract. What increases the risk? The following factors may make you more likely to develop this condition:  Using a small, thin tube (catheter) to drain pee.  Not being able to control when you pee or poop (incontinence).  Being female. If you are female, these things can increase the risk: ? Using these methods to prevent pregnancy:  A medicine that kills sperm (spermicide).  A device that blocks sperm (diaphragm). ? Having low levels of a female hormone (estrogen). ? Being pregnant. You are more likely to develop this condition if:  You have genes that add to your risk.  You are sexually active.  You take antibiotic medicines.  You have trouble peeing because of: ? A prostate that is bigger than normal, if you are female. ? A blockage in the part of your body that drains pee from the bladder. ? A kidney stone. ? A nerve condition that affects your bladder. ? Not getting enough to drink. ? Not peeing often enough.  You have other conditions, such as: ? Diabetes. ? A weak disease-fighting system (immune system). ? Sickle cell disease. ? Gout. ? Injury of the spine. What are the signs or symptoms? Symptoms of this condition include:  Needing to pee right away.  Peeing small amounts often.  Pain or burning when peeing.  Blood in the pee.  Pee that smells bad or not like normal.  Trouble peeing.  Pee that is cloudy.  Fluid coming from the vagina, if you are female.  Pain in the belly or lower back. Other symptoms include:  Vomiting.  Not  feeling hungry.  Feeling mixed up (confused). This may be the first symptom in older adults.  Being tired and grouchy (irritable).  A fever.  Watery poop (diarrhea). How is this treated?  Taking antibiotic medicine.  Taking other medicines.  Drinking enough water. In some cases, you may need to see a specialist. Follow these instructions at home: Medicines  Take over-the-counter and prescription medicines only as told by your doctor.  If you were prescribed an antibiotic medicine, take it as told by your doctor. Do not stop taking it even if you start to feel better. General instructions  Make sure you: ? Pee until your bladder is empty. ? Do not hold pee for a long time. ? Empty your bladder after sex. ? Wipe from front to back after peeing or pooping if you are a female. Use each tissue one time when you wipe.  Drink enough fluid to keep your pee pale yellow.  Keep all follow-up visits.   Contact a doctor if:  You do not get better after 1-2 days.  Your symptoms go away and then come back. Get help right away if:  You have very bad back pain.  You have very bad pain in your lower belly.  You have a fever.  You have chills.  You feeling like you will vomit or you vomit. Summary  A urinary tract infection (UTI) is an infection of any part of the urinary tract.  This condition is caused by   germs in your genital area.  There are many risk factors for a UTI.  Treatment includes antibiotic medicines.  Drink enough fluid to keep your pee pale yellow. This information is not intended to replace advice given to you by your health care provider. Make sure you discuss any questions you have with your health care provider. Document Revised: 08/02/2019 Document Reviewed: 08/02/2019 Elsevier Patient Education  2021 Elsevier Inc.  

## 2020-04-07 ENCOUNTER — Other Ambulatory Visit: Payer: Self-pay | Admitting: Internal Medicine

## 2020-04-07 ENCOUNTER — Telehealth: Payer: Self-pay

## 2020-04-07 DIAGNOSIS — B373 Candidiasis of vulva and vagina: Secondary | ICD-10-CM

## 2020-04-07 DIAGNOSIS — B3731 Acute candidiasis of vulva and vagina: Secondary | ICD-10-CM

## 2020-04-07 LAB — URINE CYTOLOGY ANCILLARY ONLY
Bacterial Vaginitis (gardnerella): NEGATIVE
Candida Glabrata: NEGATIVE
Candida Vaginitis: POSITIVE — AB
Comment: NEGATIVE
Comment: NEGATIVE
Comment: NEGATIVE

## 2020-04-07 MED ORDER — FLUCONAZOLE 150 MG PO TABS: 150.0000 mg | ORAL_TABLET | Freq: Once | ORAL | 0 refills | Status: AC

## 2020-04-07 NOTE — Telephone Encounter (Signed)
Patient called left voice sorry she missed your call. You can give her a call back cell # [phone] or work # [phone].

## 2020-04-07 NOTE — Telephone Encounter (Signed)
Patient left voice mail returning your call. Cb# [phone].

## 2020-04-07 NOTE — Telephone Encounter (Signed)
LVM for pt to call the office.

## 2020-04-08 ENCOUNTER — Other Ambulatory Visit: Payer: Self-pay | Admitting: Family Medicine

## 2020-04-08 NOTE — Telephone Encounter (Signed)
Pt notified that medication has been sent in

## 2020-04-08 NOTE — Telephone Encounter (Signed)
LVM for pt to call the office.

## 2020-04-08 NOTE — Telephone Encounter (Signed)
Pt notified this was an old Pharmacist, community message

## 2020-04-09 LAB — URINE CULTURE

## 2020-04-26 ENCOUNTER — Encounter: Payer: Self-pay | Admitting: Family Medicine

## 2020-04-29 ENCOUNTER — Other Ambulatory Visit: Payer: Self-pay

## 2020-04-29 ENCOUNTER — Encounter: Payer: Self-pay | Admitting: Family Medicine

## 2020-04-29 ENCOUNTER — Ambulatory Visit (INDEPENDENT_AMBULATORY_CARE_PROVIDER_SITE_OTHER): Payer: BC Managed Care – PPO | Admitting: Family Medicine

## 2020-04-29 VITALS — BP 114/72 | HR 87 | Resp 15 | Ht 61.0 in | Wt 206.0 lb

## 2020-04-29 DIAGNOSIS — E559 Vitamin D deficiency, unspecified: Secondary | ICD-10-CM

## 2020-04-29 DIAGNOSIS — F409 Phobic anxiety disorder, unspecified: Secondary | ICD-10-CM

## 2020-04-29 DIAGNOSIS — Z1159 Encounter for screening for other viral diseases: Secondary | ICD-10-CM | POA: Diagnosis not present

## 2020-04-29 DIAGNOSIS — F5105 Insomnia due to other mental disorder: Secondary | ICD-10-CM

## 2020-04-29 DIAGNOSIS — E785 Hyperlipidemia, unspecified: Secondary | ICD-10-CM

## 2020-04-29 DIAGNOSIS — I10 Essential (primary) hypertension: Secondary | ICD-10-CM

## 2020-04-29 DIAGNOSIS — M25512 Pain in left shoulder: Secondary | ICD-10-CM

## 2020-04-29 DIAGNOSIS — F32A Depression, unspecified: Secondary | ICD-10-CM

## 2020-04-29 DIAGNOSIS — R7303 Prediabetes: Secondary | ICD-10-CM

## 2020-04-29 DIAGNOSIS — R7301 Impaired fasting glucose: Secondary | ICD-10-CM

## 2020-04-29 DIAGNOSIS — F419 Anxiety disorder, unspecified: Secondary | ICD-10-CM

## 2020-04-29 DIAGNOSIS — K219 Gastro-esophageal reflux disease without esophagitis: Secondary | ICD-10-CM

## 2020-04-29 MED ORDER — FAMOTIDINE 40 MG PO TABS: 40.0000 mg | ORAL_TABLET | Freq: Every day | ORAL | 0 refills | Status: DC

## 2020-04-29 MED ORDER — PREDNISONE 10 MG PO TABS: 10.0000 mg | ORAL_TABLET | Freq: Two times a day (BID) | ORAL | 0 refills | Status: DC

## 2020-04-29 MED ORDER — IBUPROFEN 800 MG PO TABS: 800.0000 mg | ORAL_TABLET | Freq: Three times a day (TID) | ORAL | 0 refills | Status: DC | PRN

## 2020-04-29 MED ORDER — ALPRAZOLAM 1 MG PO TABS: 1.0000 mg | ORAL_TABLET | Freq: Every day | ORAL | 1 refills | Status: DC

## 2020-04-29 NOTE — Patient Instructions (Signed)
F/U in 6 months, call if u you need me sooner  Labs today, Hep C screen, lipid, cmp and eGFr, tSH, vit d and cBc  Nurse please send to  [facility], Dr Christophe Louis for recent pap for pt record   Ibuprofen, prednisone and Pepcid are prescribed for short term use for left shoulder pain . If persists, call/ send message for Orthopedic referral to MD / office of your choice please  Weight goal of 195 or less in 6 months  It is important that you exercise regularly at least 30 minutes 5 times a week. If you develop chest pain, have severe difficulty breathing, or feel very tired, stop exercising immediately and seek medical attention

## 2020-04-29 NOTE — Progress Notes (Signed)
   Stephanie Cannon     MRN: [id number]      DOB: [birth year]   HPI Stephanie Cannon is here for follow up and re-evaluation of chronic medical conditions, medication management and review of any available recent lab and radiology data.  Preventive health is updated, specifically  Cancer screening and Immunization.   Left shoulder pain  Limiting movement worse in the past 2 months, no inciting trauma has had injectionin the shoulder in the past which was helpful, pain radiates to left chest No regular exercise, managing weight with diet, will re start exrcise  ROS Denies recent fever or chills. Denies sinus pressure, nasal congestion, ear pain or sore throat. Denies chest congestion, productive cough or wheezing. Denies chest pains, palpitations and leg swelling Denies abdominal pain, nausea, vomiting,diarrhea or constipation.   Denies dysuria, frequency, hesitancy or incontinence. Denies headaches, seizures, numbness, or tingling. Denies depression, anxiety or insomnia. Denies skin break down or rash.   PE  BP 114/72   Pulse 87   Resp 15   Ht 5\' 1"  (1.549 m)   Wt 206 lb (93.4 kg)   SpO2 98%   BMI 38.92 kg/m   Patient alert and oriented and in no cardiopulmonary distress.  HEENT: No facial asymmetry, EOMI,     Neck supple .  Chest: Clear to auscultation bilaterally.  CVS: S1, S2 no murmurs, no S3.Regular rate.  ABD: Soft non tender.   Ext: No edema  MS: Adequate ROM spine, , hips and knees.Markedly reduced in left shoulder  Skin: Intact, no ulcerations or rash noted.  Psych: Good eye contact, normal affect. Memory intact not anxious or depressed appearing.  CNS: CN 2-12 intact, power,  normal throughout.no focal deficits noted.   Assessment & Plan  Shoulder pain, left 2 month history with limitation in mobility, has ocurred in the past and responded to injection in the joint Will start with NSAID and prednisone orally short term, f no response, refer to Ortho  Essential  hypertension Controlled, no change in medication DASH diet and commitment to daily physical activity for a minimum of 30 minutes discussed and encouraged, as a part of hypertension management. The importance of attaining a healthy weight is also discussed.  BP/Weight 04/29/2020 02/06/2020 12/30/2019 10/30/2019 09/23/2019 08/20/2019 2/54/2706  Systolic BP 237 - 628 315 176 160 737  Diastolic BP 72 - 74 78 80 84 68  Wt. (Lbs) 206 200 207 204 206 197 204.4  BMI 38.92 37.79 39.11 38.55 38.92 37.22 38.62  Some encounter information is confidential and restricted. Go to Review Flowsheets activity to see all data.       Hyperlipidemia LDL goal <100 Hyperlipidemia:Low fat diet discussed and encouraged.   Lipid Panel  Lab Results  Component Value Date   CHOL 237 (H) 04/29/2020   HDL 63 04/29/2020   LDLCALC 156 (H) 04/29/2020   TRIG 101 04/29/2020   CHOLHDL 3.8 04/29/2020     Needs to reduce fat intake, markedly elevated  Insomnia due to anxiety and fear Sleep hygiene reviewed and written information offered also. Prescription sent for  medication needed.   Anxiety and depression Controlled, no change in medication   GERD (gastroesophageal reflux disease) Controlled, no change in medication   Vitamin D deficiency Untreated, needs weekly vit d x 9 to 12 months

## 2020-04-30 LAB — CBC
Hematocrit: 45.1 % (ref 34.0–46.6)
Hemoglobin: 15.1 g/dL (ref 11.1–15.9)
MCH: 30.3 pg (ref 26.6–33.0)
MCHC: 33.5 g/dL (ref 31.5–35.7)
MCV: 90 fL (ref 79–97)
Platelets: 276 10*3/uL (ref 150–450)
RBC: 4.99 x10E6/uL (ref 3.77–5.28)
RDW: 13.3 % (ref 11.7–15.4)
WBC: 6.6 10*3/uL (ref 3.4–10.8)

## 2020-04-30 LAB — CMP14+EGFR
ALT: 14 IU/L (ref 0–32)
AST: 19 IU/L (ref 0–40)
Albumin/Globulin Ratio: 1.6 (ref 1.2–2.2)
Albumin: 4.6 g/dL (ref 3.8–4.8)
Alkaline Phosphatase: 103 IU/L (ref 44–121)
BUN/Creatinine Ratio: 13 (ref 9–23)
BUN: 12 mg/dL (ref 6–24)
Bilirubin Total: 0.3 mg/dL (ref 0.0–1.2)
CO2: 26 mmol/L (ref 20–29)
Calcium: 9.7 mg/dL (ref 8.7–10.2)
Chloride: 96 mmol/L (ref 96–106)
Creatinine, Ser: 0.91 mg/dL (ref 0.57–1.00)
Globulin, Total: 2.8 g/dL (ref 1.5–4.5)
Glucose: 75 mg/dL (ref 65–99)
Potassium: 3.6 mmol/L (ref 3.5–5.2)
Sodium: 138 mmol/L (ref 134–144)
Total Protein: 7.4 g/dL (ref 6.0–8.5)
eGFR: 77 mL/min/{1.73_m2} (ref >59)

## 2020-04-30 LAB — LIPID PANEL
Chol/HDL Ratio: 3.8 ratio (ref 0.0–4.4)
Cholesterol, Total: 237 mg/dL — ABNORMAL HIGH (ref 100–199)
HDL: 63 mg/dL (ref >39)
LDL Chol Calc (NIH): 156 mg/dL — ABNORMAL HIGH (ref 0–99)
Triglycerides: 101 mg/dL (ref 0–149)
VLDL Cholesterol Cal: 18 mg/dL (ref 5–40)

## 2020-04-30 LAB — HEPATITIS C ANTIBODY: Hep C Virus Ab: <0.1 s/co ratio (ref 0.0–0.9)

## 2020-04-30 LAB — VITAMIN D 25 HYDROXY (VIT D DEFICIENCY, FRACTURES): Vit D, 25-Hydroxy: 16.9 ng/mL — ABNORMAL LOW (ref 30.0–100.0)

## 2020-04-30 LAB — TSH: TSH: 1.07 u[IU]/mL (ref 0.450–4.500)

## 2020-05-01 ENCOUNTER — Encounter: Payer: Self-pay | Admitting: Family Medicine

## 2020-05-01 DIAGNOSIS — M25512 Pain in left shoulder: Secondary | ICD-10-CM | POA: Insufficient documentation

## 2020-05-01 DIAGNOSIS — E559 Vitamin D deficiency, unspecified: Secondary | ICD-10-CM | POA: Insufficient documentation

## 2020-05-01 MED ORDER — ERGOCALCIFEROL 1.25 MG (50000 UT) PO CAPS: 50000.0000 [IU] | ORAL_CAPSULE | ORAL | 3 refills | Status: DC

## 2020-05-01 NOTE — Assessment & Plan Note (Signed)
Hyperlipidemia:Low fat diet discussed and encouraged.   Lipid Panel  Lab Results  Component Value Date   CHOL 237 (H) 04/29/2020   HDL 63 04/29/2020   LDLCALC 156 (H) 04/29/2020   TRIG 101 04/29/2020   CHOLHDL 3.8 04/29/2020     Needs to reduce fat intake, markedly elevated

## 2020-05-01 NOTE — Assessment & Plan Note (Signed)
Untreated, needs weekly vit d x 9 to 12 months

## 2020-05-01 NOTE — Assessment & Plan Note (Signed)
Sleep hygiene reviewed and written information offered also. Prescription sent for  medication needed.  

## 2020-05-01 NOTE — Assessment & Plan Note (Signed)
Controlled, no change in medication  

## 2020-05-01 NOTE — Assessment & Plan Note (Signed)
Controlled, no change in medication DASH diet and commitment to daily physical activity for a minimum of 30 minutes discussed and encouraged, as a part of hypertension management. The importance of attaining a healthy weight is also discussed.  BP/Weight 04/29/2020 02/06/2020 12/30/2019 10/30/2019 09/23/2019 08/20/2019 9/74/7185  Systolic BP 501 - 586 825 749 355 217  Diastolic BP 72 - 74 78 80 84 68  Wt. (Lbs) 206 200 207 204 206 197 204.4  BMI 38.92 37.79 39.11 38.55 38.92 37.22 38.62  Some encounter information is confidential and restricted. Go to Review Flowsheets activity to see all data.

## 2020-05-01 NOTE — Assessment & Plan Note (Signed)
2 month history with limitation in mobility, has ocurred in the past and responded to injection in the joint Will start with NSAID and prednisone orally short term, f no response, refer to Ortho

## 2020-05-28 ENCOUNTER — Other Ambulatory Visit: Payer: Self-pay

## 2020-05-28 ENCOUNTER — Encounter: Payer: Self-pay | Admitting: Family Medicine

## 2020-05-28 DIAGNOSIS — Z1211 Encounter for screening for malignant neoplasm of colon: Secondary | ICD-10-CM

## 2020-06-08 ENCOUNTER — Other Ambulatory Visit: Payer: Self-pay | Admitting: Family Medicine

## 2020-06-09 ENCOUNTER — Other Ambulatory Visit: Payer: Self-pay | Admitting: *Deleted

## 2020-06-09 ENCOUNTER — Encounter: Payer: Self-pay | Admitting: Family Medicine

## 2020-06-09 NOTE — Telephone Encounter (Signed)
I'm fine with a referral if Dr. Moshe Cipro had talked to them about this previously. What is the reason for the referral? Like the diagnosis code?

## 2020-06-09 NOTE — Telephone Encounter (Signed)
That is fine. I would need to review their chart to send in the referral, so I'd need a visit for that.

## 2020-06-09 NOTE — Telephone Encounter (Signed)
I set up pt appt for Thursday with dr simpson it is for his hand pain and I dont know if they have addressed at previous visit would you rather wait until dr simpson see him?

## 2020-06-15 ENCOUNTER — Other Ambulatory Visit: Payer: Self-pay | Admitting: Family Medicine

## 2020-06-29 ENCOUNTER — Other Ambulatory Visit: Payer: Self-pay | Admitting: Internal Medicine

## 2020-06-29 DIAGNOSIS — M62830 Muscle spasm of back: Secondary | ICD-10-CM

## 2020-07-09 ENCOUNTER — Telehealth: Payer: Self-pay

## 2020-07-09 NOTE — Telephone Encounter (Signed)
FMLA   COPIED NOTED SLEEVED

## 2020-07-15 ENCOUNTER — Telehealth: Payer: Self-pay | Admitting: Family Medicine

## 2020-07-15 NOTE — Telephone Encounter (Signed)
Denita lvm that she needs a steriod injection .,  I called to advised I did receive the VM, and I have sent the message to the nurse for approval, and someone would call her back

## 2020-07-16 DIAGNOSIS — Z0279 Encounter for issue of other medical certificate: Secondary | ICD-10-CM

## 2020-07-16 NOTE — Telephone Encounter (Signed)
Has to be an appt to get injection

## 2020-07-20 ENCOUNTER — Telehealth: Payer: Self-pay

## 2020-07-20 NOTE — Telephone Encounter (Signed)
Patient informed forms are ready. Faxed to HR department [phone]. Patient will pick up copy of forms also.

## 2020-07-20 NOTE — Telephone Encounter (Signed)
error 

## 2020-07-21 ENCOUNTER — Other Ambulatory Visit: Payer: Self-pay | Admitting: Family Medicine

## 2020-07-21 DIAGNOSIS — Z1231 Encounter for screening mammogram for malignant neoplasm of breast: Secondary | ICD-10-CM

## 2020-07-23 ENCOUNTER — Encounter: Payer: Self-pay | Admitting: Family Medicine

## 2020-07-23 ENCOUNTER — Other Ambulatory Visit: Payer: Self-pay

## 2020-07-23 ENCOUNTER — Ambulatory Visit (INDEPENDENT_AMBULATORY_CARE_PROVIDER_SITE_OTHER): Payer: BC Managed Care – PPO | Admitting: Family Medicine

## 2020-07-23 VITALS — BP 134/80 | HR 78 | Temp 97.9°F | Resp 20 | Ht 61.0 in | Wt 199.0 lb

## 2020-07-23 DIAGNOSIS — I10 Essential (primary) hypertension: Secondary | ICD-10-CM

## 2020-07-23 DIAGNOSIS — M4316 Spondylolisthesis, lumbar region: Secondary | ICD-10-CM | POA: Diagnosis not present

## 2020-07-23 DIAGNOSIS — M62830 Muscle spasm of back: Secondary | ICD-10-CM

## 2020-07-23 MED ORDER — PREDNISONE 10 MG PO TABS: 10.0000 mg | ORAL_TABLET | Freq: Two times a day (BID) | ORAL | 0 refills | Status: DC

## 2020-07-23 MED ORDER — METHYLPREDNISOLONE ACETATE 80 MG/ML IJ SUSP
80.0000 mg | Freq: Once | INTRAMUSCULAR | Status: AC
  Administered 2020-07-23: 80 mg via INTRAMUSCULAR

## 2020-07-23 MED ORDER — KETOROLAC TROMETHAMINE 60 MG/2ML IM SOLN
60.0000 mg | Freq: Once | INTRAMUSCULAR | Status: AC
  Administered 2020-07-23: 60 mg via INTRAMUSCULAR

## 2020-07-23 NOTE — Patient Instructions (Signed)
F/U as before, call if you need me sooner  Toradol 60 mg IM and depo medrol 80 mg iM in the office today to be followed by a 5 day course  of prednisone for your left sciatic pain  Congrats on continued weight loss keep it up!  Thanks for choosing [facility], we consider it a privelige to serve you.

## 2020-07-26 ENCOUNTER — Encounter: Payer: Self-pay | Admitting: Family Medicine

## 2020-07-26 NOTE — Assessment & Plan Note (Signed)
Acute left sciatic pain Uncontrolled.Toradol and depo medrol administered IM in the office , to be followed by a short course of oral prednisone and muscle relaxants.

## 2020-07-26 NOTE — Assessment & Plan Note (Signed)
  Patient re-educated about  the importance of commitment to a  minimum of 150 minutes of exercise per week as able.  The importance of healthy food choices with portion control discussed, as well as eating regularly and within a 12 hour window most days. The need to choose "clean , green" food 50 to 75% of the time is discussed, as well as to make water the primary drink and set a goal of 64 ounces water daily.    Weight /BMI 07/23/2020 04/29/2020 02/06/2020  WEIGHT 199 lb 206 lb 200 lb  HEIGHT '5\' 1"'$  '5\' 1"'$  '5\' 1"'$   BMI 37.6 kg/m2 38.92 kg/m2 37.79 kg/m2  Some encounter information is confidential and restricted. Go to Review Flowsheets activity to see all data.

## 2020-07-26 NOTE — Progress Notes (Signed)
   Stephanie Cannon     MRN: [id number]      DOB: 1970-08-19   HPI [name] is here flow back pain radiaiting to left buttock with burning in the buttock x 2 days, no known trigger Has sciatica Denies any new lower extremity weakness or numbness or incontinence. Doing well with weight loss. No other concerns   ROS Denies recent fever or chills. Denies sinus pressure, nasal congestion, ear pain or sore throat. Denies chest congestion, productive cough or wheezing. Denies chest pains, palpitations and leg swelling Denies abdominal pain, nausea, vomiting,diarrhea or constipation.   Denies dysuria, frequency, hesitancy or incontinence.  Denies headaches, seizures, numbness, or tingling. Denies uncontrolled  depression, anxiety or insomnia. Denies skin break down or rash.   PE  BP 134/80 (BP Location: Right Arm, Patient Position: Sitting, Cuff Size: Large)   Pulse 78   Temp 97.9 F (36.6 C)   Resp 20   Ht '5\' 1"'$  (1.549 m)   Wt 199 lb (90.3 kg)   SpO2 97%   BMI 37.60 kg/m   Patient alert and oriented and in no cardiopulmonary distress.Uncomfortable due to pain  HEENT: No facial asymmetry, EOMI,     Neck supple .  Chest: Clear to auscultation bilaterally.  CVS: S1, S2 no murmurs, no S3.Regular rate.  Ext: No edema  MS: decreased  ROM lumbar spine, tender over left SI joint, normal in shoulders, hips and knees.  Skin: Intact, no ulcerations or rash noted.  Psych: Good eye contact, normal affect. Memory intact not anxious or depressed appearing.  CNS: CN 2-12 intact, power,  normal throughout.no focal deficits noted.   Assessment & Plan  Spondylolisthesis of lumbar region Acute left sciatic pain Uncontrolled.Toradol and depo medrol administered IM in the office , to be followed by a short course of oral prednisone and muscle relaxants.   Morbid obesity ([place])  Patient re-educated about  the importance of commitment to a  minimum of 150 minutes of exercise per week as  able.  The importance of healthy food choices with portion control discussed, as well as eating regularly and within a 12 hour window most days. The need to choose "clean , green" food 50 to 75% of the time is discussed, as well as to make water the primary drink and set a goal of 64 ounces water daily.    Weight /BMI 07/23/2020 04/29/2020 02/06/2020  WEIGHT 199 lb 206 lb 200 lb  HEIGHT '5\' 1"'$  '5\' 1"'$  '5\' 1"'$   BMI 37.6 kg/m2 38.92 kg/m2 37.79 kg/m2  Some encounter information is confidential and restricted. Go to Review Flowsheets activity to see all data.      Essential hypertension Controlled, no change in medication DASH diet and commitment to daily physical activity for a minimum of 30 minutes discussed and encouraged, as a part of hypertension management. The importance of attaining a healthy weight is also discussed.  BP/Weight 07/23/2020 04/29/2020 02/06/2020 12/30/2019 10/30/2019 09/23/2019 Q000111Q  Systolic BP Q000111Q 99991111 - Q000111Q [id number]  Diastolic BP 80 72 - 74 78 80 84  Wt. (Lbs) 199 206 200 207 204 206 197  BMI 37.6 38.92 37.79 39.11 38.55 38.92 37.22  Some encounter information is confidential and restricted. Go to Review Flowsheets activity to see all data.

## 2020-07-26 NOTE — Assessment & Plan Note (Signed)
Controlled, no change in medication DASH diet and commitment to daily physical activity for a minimum of 30 minutes discussed and encouraged, as a part of hypertension management. The importance of attaining a healthy weight is also discussed.  BP/Weight 07/23/2020 04/29/2020 02/06/2020 12/30/2019 10/30/2019 09/23/2019 Q000111Q  Systolic BP Q000111Q 99991111 - Q000111Q [id number]  Diastolic BP 80 72 - 74 78 80 84  Wt. (Lbs) 199 206 200 207 204 206 197  BMI 37.6 38.92 37.79 39.11 38.55 38.92 37.22  Some encounter information is confidential and restricted. Go to Review Flowsheets activity to see all data.

## 2020-08-11 ENCOUNTER — Encounter: Payer: Self-pay | Admitting: Family Medicine

## 2020-08-17 ENCOUNTER — Encounter: Payer: Self-pay | Admitting: Family Medicine

## 2020-09-17 ENCOUNTER — Other Ambulatory Visit: Payer: Self-pay | Admitting: Internal Medicine

## 2020-09-17 ENCOUNTER — Other Ambulatory Visit: Payer: Self-pay | Admitting: Family Medicine

## 2020-09-17 DIAGNOSIS — F419 Anxiety disorder, unspecified: Secondary | ICD-10-CM

## 2020-09-18 ENCOUNTER — Encounter: Payer: Self-pay | Admitting: Family Medicine

## 2020-09-18 ENCOUNTER — Ambulatory Visit
Admission: EM | Admit: 2020-09-18 | Discharge: 2020-09-18 | Disposition: A | Discharge status: Home / Self Care | Payer: BC Managed Care – PPO

## 2020-09-18 ENCOUNTER — Other Ambulatory Visit: Payer: Self-pay

## 2020-09-18 ENCOUNTER — Ambulatory Visit: Payer: Self-pay

## 2020-09-21 ENCOUNTER — Telehealth: Payer: Self-pay | Admitting: Family Medicine

## 2020-09-21 NOTE — Telephone Encounter (Signed)
FMLA   Copied Noted Sleeved  

## 2020-09-22 ENCOUNTER — Encounter: Payer: Self-pay | Admitting: Family Medicine

## 2020-09-28 ENCOUNTER — Encounter: Payer: Self-pay | Admitting: Family Medicine

## 2020-09-28 ENCOUNTER — Other Ambulatory Visit: Payer: Self-pay | Admitting: Internal Medicine

## 2020-09-28 DIAGNOSIS — M62830 Muscle spasm of back: Secondary | ICD-10-CM

## 2020-09-29 ENCOUNTER — Encounter: Payer: Self-pay | Admitting: Family Medicine

## 2020-09-29 ENCOUNTER — Telehealth: Payer: Self-pay | Admitting: Family Medicine

## 2020-09-29 NOTE — Telephone Encounter (Signed)
Patient called to check in to see if  FMLA. paperwork for dad  Stephanie Cannon DOB [birth year] has been competed

## 2020-09-29 NOTE — Telephone Encounter (Signed)
I left the corrected paperwork  (FMLA)with Rosaria Ferries over 1 week ago

## 2020-10-01 DIAGNOSIS — Z0279 Encounter for issue of other medical certificate: Secondary | ICD-10-CM

## 2020-10-07 ENCOUNTER — Encounter: Payer: Self-pay | Admitting: Family Medicine

## 2020-10-07 NOTE — Telephone Encounter (Signed)
This has been completed on the corrected Paperwork   ---However the Dads FMLA is still pending

## 2020-10-08 ENCOUNTER — Telehealth: Payer: Self-pay

## 2020-10-08 NOTE — Telephone Encounter (Signed)
Per Jackelyn Poling- dad is in so much pain. I have done all I know to do. My mom is very concerned because she is up at night with my dad. Please advise

## 2020-10-14 ENCOUNTER — Encounter: Payer: Self-pay | Admitting: Family Medicine

## 2020-10-21 ENCOUNTER — Ambulatory Visit: Payer: BC Managed Care – PPO

## 2020-10-21 ENCOUNTER — Ambulatory Visit
Admission: RE | Admit: 2020-10-21 | Discharge: 2020-10-21 | Disposition: A | Discharge status: Home / Self Care | Payer: BC Managed Care – PPO | Source: Ambulatory Visit | Attending: Family Medicine | Admitting: Family Medicine

## 2020-10-21 ENCOUNTER — Other Ambulatory Visit: Payer: Self-pay

## 2020-10-21 DIAGNOSIS — Z1231 Encounter for screening mammogram for malignant neoplasm of breast: Secondary | ICD-10-CM

## 2020-10-29 ENCOUNTER — Other Ambulatory Visit: Payer: Self-pay

## 2020-10-29 ENCOUNTER — Ambulatory Visit (INDEPENDENT_AMBULATORY_CARE_PROVIDER_SITE_OTHER): Payer: BC Managed Care – PPO | Admitting: Family Medicine

## 2020-10-29 ENCOUNTER — Encounter: Payer: Self-pay | Admitting: Family Medicine

## 2020-10-29 VITALS — BP 138/82 | HR 74 | Resp 16 | Ht 61.0 in | Wt 208.4 lb

## 2020-10-29 DIAGNOSIS — R7303 Prediabetes: Secondary | ICD-10-CM

## 2020-10-29 DIAGNOSIS — Z23 Encounter for immunization: Secondary | ICD-10-CM | POA: Diagnosis not present

## 2020-10-29 DIAGNOSIS — E559 Vitamin D deficiency, unspecified: Secondary | ICD-10-CM

## 2020-10-29 DIAGNOSIS — I10 Essential (primary) hypertension: Secondary | ICD-10-CM

## 2020-10-29 DIAGNOSIS — F5105 Insomnia due to other mental disorder: Secondary | ICD-10-CM

## 2020-10-29 DIAGNOSIS — E785 Hyperlipidemia, unspecified: Secondary | ICD-10-CM | POA: Diagnosis not present

## 2020-10-29 DIAGNOSIS — F411 Generalized anxiety disorder: Secondary | ICD-10-CM | POA: Diagnosis not present

## 2020-10-29 DIAGNOSIS — F409 Phobic anxiety disorder, unspecified: Secondary | ICD-10-CM

## 2020-10-29 DIAGNOSIS — F322 Major depressive disorder, single episode, severe without psychotic features: Secondary | ICD-10-CM

## 2020-10-29 MED ORDER — HYDROXYZINE PAMOATE 25 MG PO CAPS: ORAL_CAPSULE | ORAL | 2 refills | Status: DC

## 2020-10-29 MED ORDER — FLUOXETINE HCL 40 MG PO CAPS: 40.0000 mg | ORAL_CAPSULE | Freq: Every day | ORAL | 3 refills | Status: DC

## 2020-10-29 MED ORDER — BUSPIRONE HCL 5 MG PO TABS: 5.0000 mg | ORAL_TABLET | Freq: Three times a day (TID) | ORAL | 2 refills | Status: DC

## 2020-10-29 NOTE — Patient Instructions (Signed)
F/U first week in January, call if you need me sooner, shingrix #2 at visit  Flu and shingrix # 1 today  Lipid, c,mp and EGFr, hBA1C and vit D asap, fasting  You are referred to Therapist  New dose fluoxetine 40 mg daily, new for anxiety is buspar 5 mg one three times daily, new for sleep hydroxyzine 25 mg one tot two at bedtime  I will contact you re weight loss medication once labs are in and we have more info  It is important that you exercise regularly at least 30 minutes 5 times a week. If you develop chest pain, have severe difficulty breathing, or feel very tired, stop exercising immediately and seek medical attention   Thanks for choosing [facility], we consider it a privelige to serve you.   f

## 2020-10-29 NOTE — Progress Notes (Signed)
   Stephanie Cannon     MRN: [id number]      DOB: 1970-04-03   HPI Stephanie Cannon is here for follow up and re-evaluation of chronic medical conditions, medication management and review of any available recent lab and radiology data.  Preventive health is updated, specifically  Cancer screening and Immunization.   C/o weight regain wants help C/o uncontrolled anxiety and epression with insomnia ROS Denies recent fever or chills. Denies sinus pressure, nasal congestion, ear pain or sore throat. Denies chest congestion, productive cough or wheezing. Denies chest pains, palpitations and leg swelling Denies abdominal pain, nausea, vomiting,diarrhea or constipation.   Denies dysuria, frequency, hesitancy or incontinence. Denies uncontrolled joint pain, swelling and limitation in mobility. Denies headaches, seizures, numbness, or tingling. C/o uncontrolled depression , anxiety and insomnia. Not suicidal or homicidal. Wants therapy referral and med management. Also wants help with weight loss Denies skin break down or rash.   PE  BP 138/82   Pulse 74   Resp 16   Ht 5\' 1"  (1.549 m)   Wt 208 lb 6.4 oz (94.5 kg)   SpO2 98%   BMI 39.38 kg/m   Patient alert and oriented and in no cardiopulmonary distress.  HEENT: No facial asymmetry, EOMI,     Neck supple .  Chest: Clear to auscultation bilaterally.  CVS: S1, S2 no murmurs, no S3.Regular rate.  ABD: Soft non tender.   Ext: No edema  MS: Adequate ROM spine, shoulders, hips and knees.  Skin: Intact, no ulcerations or rash noted.  Psych: Good eye contact, normal affect. Memory intact not anxious or depressed appearing.  CNS: CN 2-12 intact, power,  normal throughout.no focal deficits noted.   Assessment & Plan  Depression, major, single episode, severe ([place]) Increase medication and refer to therapy, inc to fluoxetine 40 mg  GAD (generalized anxiety disorder) Start buspar and refer to Therapy  Insomnia due to anxiety and  fear Not adequately managed, medciation change and therapy  Morbid obesity ([place])  Patient re-educated about  the importance of commitment to a  minimum of 150 minutes of exercise per week as able.  The importance of healthy food choices with portion control discussed, as well as eating regularly and within a 12 hour window most days. The need to choose "clean , green" food 50 to 75% of the time is discussed, as well as to make water the primary drink and set a goal of 64 ounces water daily.    Weight /BMI 10/29/2020 07/23/2020 04/29/2020  WEIGHT 208 lb 6.4 oz 199 lb 206 lb  HEIGHT 5\' 1"  5\' 1"  5\' 1"   BMI 39.38 kg/m2 37.6 kg/m2 38.92 kg/m2  Some encounter information is confidential and restricted. Go to Review Flowsheets activity to see all data.    Med change once labs are updated  Essential hypertension Controlled, no change in medication DASH diet and commitment to daily physical activity for a minimum of 30 minutes discussed and encouraged, as a part of hypertension management. The importance of attaining a healthy weight is also discussed.  BP/Weight 10/29/2020 07/23/2020 04/29/2020 02/06/2020 12/30/2019 10/30/2019 3/81/8299  Systolic BP 371 696 789 - 381 017 510  Diastolic BP 82 80 72 - 74 78 80  Wt. (Lbs) 208.4 199 206 200 207 204 206  BMI 39.38 37.6 38.92 37.79 39.11 38.55 38.92  Some encounter information is confidential and restricted. Go to Review Flowsheets activity to see all data.

## 2020-11-02 ENCOUNTER — Encounter: Payer: Self-pay | Admitting: Family Medicine

## 2020-11-02 DIAGNOSIS — F322 Major depressive disorder, single episode, severe without psychotic features: Secondary | ICD-10-CM | POA: Insufficient documentation

## 2020-11-02 MED ORDER — ALPRAZOLAM 1 MG PO TABS: 1.0000 mg | ORAL_TABLET | Freq: Every day | ORAL | 1 refills | Status: DC

## 2020-11-02 NOTE — Assessment & Plan Note (Signed)
Increase medication and refer to therapy, inc to fluoxetine 40 mg

## 2020-11-02 NOTE — Assessment & Plan Note (Signed)
Start buspar and refer to Therapy

## 2020-11-02 NOTE — Assessment & Plan Note (Signed)
Not adequately managed, medciation change and therapy

## 2020-11-02 NOTE — Assessment & Plan Note (Signed)
Controlled, no change in medication DASH diet and commitment to daily physical activity for a minimum of 30 minutes discussed and encouraged, as a part of hypertension management. The importance of attaining a healthy weight is also discussed.  BP/Weight 10/29/2020 07/23/2020 04/29/2020 02/06/2020 12/30/2019 10/30/2019 8/48/5927  Systolic BP 639 432 003 - 794 446 190  Diastolic BP 82 80 72 - 74 78 80  Wt. (Lbs) 208.4 199 206 200 207 204 206  BMI 39.38 37.6 38.92 37.79 39.11 38.55 38.92  Some encounter information is confidential and restricted. Go to Review Flowsheets activity to see all data.

## 2020-11-02 NOTE — Assessment & Plan Note (Signed)
  Patient re-educated about  the importance of commitment to a  minimum of 150 minutes of exercise per week as able.  The importance of healthy food choices with portion control discussed, as well as eating regularly and within a 12 hour window most days. The need to choose "clean , green" food 50 to 75% of the time is discussed, as well as to make water the primary drink and set a goal of 64 ounces water daily.    Weight /BMI 10/29/2020 07/23/2020 04/29/2020  WEIGHT 208 lb 6.4 oz 199 lb 206 lb  HEIGHT 5\' 1"  5\' 1"  5\' 1"   BMI 39.38 kg/m2 37.6 kg/m2 38.92 kg/m2  Some encounter information is confidential and restricted. Go to Review Flowsheets activity to see all data.    Med change once labs are updated

## 2020-11-19 ENCOUNTER — Ambulatory Visit (INDEPENDENT_AMBULATORY_CARE_PROVIDER_SITE_OTHER): Payer: BC Managed Care – PPO | Admitting: Licensed Clinical Social Worker

## 2020-11-19 ENCOUNTER — Telehealth: Payer: Self-pay

## 2020-11-19 ENCOUNTER — Other Ambulatory Visit: Payer: Self-pay

## 2020-11-19 ENCOUNTER — Encounter (HOSPITAL_COMMUNITY): Payer: Self-pay | Admitting: Physical Therapy

## 2020-11-19 ENCOUNTER — Encounter: Payer: Self-pay | Admitting: Family Medicine

## 2020-11-19 DIAGNOSIS — F411 Generalized anxiety disorder: Secondary | ICD-10-CM

## 2020-11-19 NOTE — Therapy (Signed)
[place], [place], [address] Phone: [phone]   Fax:  [phone]  Patient Details  Name: Stephanie Cannon MRN: [id number] Date of Birth: 11-01-1970 Referring Provider:  No ref. provider found  Encounter Date: 11/19/2020  PHYSICAL THERAPY DISCHARGE SUMMARY  Visits from Start of Care: 2  Current functional level related to goals / functional outcomes: Na   Remaining deficits: NA   Education / Equipment: Na   Patient agrees to discharge. Patient goals were not met. Patient is being discharged due to not returning since the last visit.  5:05 PM, 11/19/20 Josue Hector PT DPT  Physical Therapist with [facility]  [phone] [place], [place], [address] Phone: [phone]   Fax:  [phone]

## 2020-11-19 NOTE — Telephone Encounter (Signed)
error 

## 2020-11-25 ENCOUNTER — Other Ambulatory Visit: Payer: Self-pay

## 2020-11-25 ENCOUNTER — Ambulatory Visit (INDEPENDENT_AMBULATORY_CARE_PROVIDER_SITE_OTHER): Payer: BC Managed Care – PPO | Admitting: Licensed Clinical Social Worker

## 2020-11-25 ENCOUNTER — Ambulatory Visit (INDEPENDENT_AMBULATORY_CARE_PROVIDER_SITE_OTHER): Payer: BC Managed Care – PPO | Admitting: Family Medicine

## 2020-11-25 ENCOUNTER — Encounter: Payer: Self-pay | Admitting: Family Medicine

## 2020-11-25 VITALS — BP 131/81 | HR 89 | Resp 16 | Ht 61.0 in | Wt 209.0 lb

## 2020-11-25 DIAGNOSIS — M5410 Radiculopathy, site unspecified: Secondary | ICD-10-CM

## 2020-11-25 DIAGNOSIS — F411 Generalized anxiety disorder: Secondary | ICD-10-CM

## 2020-11-25 DIAGNOSIS — F32A Depression, unspecified: Secondary | ICD-10-CM

## 2020-11-25 DIAGNOSIS — F322 Major depressive disorder, single episode, severe without psychotic features: Secondary | ICD-10-CM

## 2020-11-25 DIAGNOSIS — F409 Phobic anxiety disorder, unspecified: Secondary | ICD-10-CM

## 2020-11-25 DIAGNOSIS — M5442 Lumbago with sciatica, left side: Secondary | ICD-10-CM | POA: Diagnosis not present

## 2020-11-25 DIAGNOSIS — F5105 Insomnia due to other mental disorder: Secondary | ICD-10-CM

## 2020-11-25 DIAGNOSIS — F419 Anxiety disorder, unspecified: Secondary | ICD-10-CM

## 2020-11-25 MED ORDER — BUSPIRONE HCL 7.5 MG PO TABS: 7.5000 mg | ORAL_TABLET | Freq: Three times a day (TID) | ORAL | 2 refills | Status: DC

## 2020-11-25 MED ORDER — PREDNISONE 20 MG PO TABS: 20.0000 mg | ORAL_TABLET | Freq: Two times a day (BID) | ORAL | 0 refills | Status: DC

## 2020-11-25 MED ORDER — HYDROXYZINE PAMOATE 50 MG PO CAPS: ORAL_CAPSULE | ORAL | 2 refills | Status: DC

## 2020-11-25 MED ORDER — METHYLPREDNISOLONE ACETATE 80 MG/ML IJ SUSP
80.0000 mg | Freq: Once | INTRAMUSCULAR | Status: AC
Start: 2020-11-25 — End: 2020-11-25
  Administered 2020-11-25: 80 mg via INTRAMUSCULAR

## 2020-11-25 NOTE — Patient Instructions (Addendum)
F/U end January, call if you need me befpoe Depo medrol 80 mg iM in office for back pain and 5 day course of prednisone is prescribed  Dose of fluoxetine is 40 mg daily  Incrased dose of buspar 7.5 mg three times daily, every 8 hours, example 6am, 2pm and 10pm  For sleep hydroxyzine 50 mg one to two at bedtime   Current covid booster needed  Thanks for choosing [facility], we consider it a privelige to serve you.

## 2020-11-27 ENCOUNTER — Encounter: Payer: Self-pay | Admitting: Family Medicine

## 2020-11-27 ENCOUNTER — Other Ambulatory Visit: Payer: Self-pay | Admitting: Family Medicine

## 2020-11-27 MED ORDER — TRAMADOL HCL 50 MG PO TABS: 50.0000 mg | ORAL_TABLET | Freq: Three times a day (TID) | ORAL | 0 refills | Status: AC | PRN

## 2020-11-28 NOTE — BH Specialist Note (Signed)
[place] Initial TeleMedicine Clinical Assessment  MRN: [id number] NAME: Stephanie Cannon Date: 11/28/20  Start time: 68 End time: 12 Total time: 30  Types of Service: Telephone visit Referring Provider: Dr. Moshe Cipro Reason for Visit today: initiate VBH service  Patient/Family location: work [place] Provider location: home office All persons participating in visit: yes  I connected with patient and/or family via Telephone or Geologist, engineering  (Video is Caregility application) and verified that I am speaking with the correct person using two identifiers.   Discussed confidentiality: Yes   I discussed the limitations of telemedicine and the availability of in person appointments.  Discussed there is a possibility of technology failure and discussed alternative modes of communication if that failure occurs.  I discussed that engaging in this telemedicine visit, they consent to the provision of behavioral healthcare and the services will be billed under their insurance.  Patient and/or legal guardian expressed understanding and consented to Telemedicine visit: Yes   Treatment History Patient recently received Inpatient Treatment: No  Patient currently being seen by therapist/psychiatrist: No  Patient currently receiving the following services: no  Past Psychiatric History/Diagnosis/Hospitalization(s): Anxiety: No Bipolar Disorder: No Depression: No Mania: No Psychosis: No Schizophrenia: No Personality Disorder: No Hospitalization for psychiatric illness: No History of Electroconvulsive Shock Therapy: No Prior Suicide Attempts: No  Decreased need for sleep: No  Euphoria: No  Self Injurious behaviors: No  Family History of mental illness: No  Family History of substance abuse: No  Substance Abuse: No  DUI: No  Insomnia: No   History of violence: No  Physical, sexual or emotional abuse: No  Prior outpatient mental health  therapy: No   Clinical Assessment GAD 7 : Generalized Anxiety Score 11/25/2020 10/29/2020 11/18/2019 06/18/2019  Nervous, Anxious, on Edge 3 3 2 1   Control/stop worrying 3 3 1  0  Worry too much - different things 3 3 1  0  Trouble relaxing 3 3 3 2   Restless 3 3 3 3   Easily annoyed or irritable 3 3 3 1   Afraid - awful might happen 2 3 0 0  Total GAD 7 Score 20 21 13 7   Anxiety Difficulty - - Somewhat difficult Somewhat difficult     PHQ9 SCORE ONLY 11/25/2020 10/29/2020 07/23/2020  PHQ-9 Total Score 20 19 0      Social Functioning Social maturity: WNL Social judgement: WNL  Stress Current stressors: work Familial stressors: denies Sleep: fair Appetite: fair Coping ability: exhausted Patient taking medications as prescribed:    Current medications:  Outpatient Encounter Medications as of 11/25/2020  Medication Sig   ALPRAZolam (XANAX) 1 MG tablet Take 1 tablet (1 mg total) by mouth at bedtime.   cyclobenzaprine (FLEXERIL) 10 MG tablet TAKE 1 TABLET BY MOUTH AT BEDTIME.   ergocalciferol (VITAMIN D2) 1.25 MG (50000 UT) capsule Take 1 capsule (50,000 Units total) by mouth once a week. One capsule once weekly   esomeprazole (NEXIUM) 40 MG capsule TAKE ONE CAPSULE BY MOUTH ONCE DAILY.   FLUoxetine (PROZAC) 40 MG capsule Take 1 capsule (40 mg total) by mouth daily.   Multiple Vitamins-Minerals (HAIR SKIN & NAILS ADVANCED) TABS Take 1 tablet by mouth daily.   Multiple Vitamins-Minerals (MULTIVITAMIN GUMMIES ADULT PO) Take 1 each by mouth 2 (two) times daily.   potassium chloride (KLOR-CON) 10 MEQ tablet TAKE ONE TABLET BY MOUTH ONCE DAILY.   triamterene-hydrochlorothiazide (MAXZIDE) 75-50 MG tablet TAKE 1 TABLET BY MOUTH ONCE DAILY.   No facility-administered encounter medications  on file as of 11/25/2020.     Self-harm and/or Suicidal Behaviors Risk Assessment Self-harm risk factors: no Patient endorses recent self injurious thoughts and/or behaviors: No  Suicide ideations:  No plan to harm self or others   Danger to Others Risk Assessment Danger to others risk factors: no Patient endorses recent thoughts of harming others: No    Substance Use Assessment Patient recently consumed alcohol: No  Patient recently used drugs: No  Patient is concerned about dependence or abuse of substances: No    Goals, Interventions and Follow-up Plan Goals: Increase healthy adjustment to current life circumstances Interventions: Mindfulness or Relaxation Training and CBT Cognitive Behavioral Therapy Follow-up Plan:  Biweekly VBH session  Summary of Clinical Assessment Summary: Stephanie Cannon is a 50 yr old woman that was present for her initial session.  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  She reports feeling anxious on her way to work.  She is able to manage her anxiety for about 1 hour and 30 minutes; intensity and frequency fluctuates throughout the day. Her anxiety does not interfere with home or social engagements.   LCSW discussed what VBH psychotherapy is and is not and the importance of the therapeutic relationship to include open and honest communication between client and therapist and building trust.  Reviewed advantages and disadvantages of the therapeutic process and limitations to the therapeutic relationship including LCSW's role in maintaining the safety of the client, others and those in client's care.     Lubertha [place], LCSW

## 2020-11-28 NOTE — Progress Notes (Signed)
No show

## 2020-11-30 ENCOUNTER — Encounter: Payer: Self-pay | Admitting: Family Medicine

## 2020-11-30 NOTE — Assessment & Plan Note (Signed)
Uncontrolled , increase buspar to 7.5 mg dose

## 2020-11-30 NOTE — Assessment & Plan Note (Signed)
No improvement, has not started the higher dose of fluoxetine prescribes, will increase the dose of buspar

## 2020-11-30 NOTE — Assessment & Plan Note (Signed)
Uncontrolled. depo medrol administered IM in the office , to be followed by a short course of oral prednisone

## 2020-11-30 NOTE — Progress Notes (Signed)
   Stephanie Cannon     MRN: [id number]      DOB: September 23, 1970   HPI Stephanie Cannon is here c/o low back , buttock and left leg pain following a fall on 11/19/2020, also has numbness and tingling, no report of new urinary or fecal incontinence Poor sleep for 5/7 days, hydroxyzine has no been very beneficial at current dose but willing to try higher dose   ROS Denies recent fever or chills. Denies sinus pressure, nasal congestion, ear pain or sore throat. Denies chest congestion, productive cough or wheezing. Denies chest pains, palpitations and leg swelling Denies abdominal pain, nausea, vomiting,diarrhea or constipation.   Denies dysuria, frequency, hesitancy or incontinence.  Denies skin break down or rash.   PE  BP 131/81   Pulse 89   Resp 16   Ht 5\' 1"  (1.549 m)   Wt 209 lb (94.8 kg)   SpO2 97%   BMI 39.49 kg/m   Patient alert and oriented and in no cardiopulmonary distress.Pt in pain  HEENT: No facial asymmetry, EOMI,     Neck supple .  Chest: Clear to auscultation bilaterally.  CVS: S1, S2 no murmurs, no S3.Regular rate.  ABD: Soft non tender.   Ext: No edema  MS: decreased  ROM lumbar spine,  adequate in shoulders,  and knees.  Skin: Intact, no ulcerations or rash noted.  Psych: Good eye contact, flat  affect. Memory intact not anxious , mildly  depressed appearing.  CNS: CN 2-12 intact, power,  normal throughout.no focal deficits noted.   Assessment & Plan  Back pain with left-sided radiculopathy Uncontrolled. depo medrol administered IM in the office , to be followed by a short course of oral prednisone   Anxiety and depression No improvement, has not started the higher dose of fluoxetine prescribes, will increase the dose of buspar   Anxiety Uncontrolled , increase buspar to 7.5 mg dose  Insomnia due to anxiety and fear Sleep hygiene reviewed and written information offered also. Prescription sent for  medication needed. Higher dose of hydroxyzine is  pescribed

## 2020-11-30 NOTE — Assessment & Plan Note (Signed)
Sleep hygiene reviewed and written information offered also. Prescription sent for  medication needed. Higher dose of hydroxyzine is pescribed

## 2020-12-02 ENCOUNTER — Other Ambulatory Visit: Payer: Self-pay | Admitting: Internal Medicine

## 2020-12-02 DIAGNOSIS — M62830 Muscle spasm of back: Secondary | ICD-10-CM

## 2020-12-08 ENCOUNTER — Encounter: Payer: Self-pay | Admitting: Family Medicine

## 2020-12-09 ENCOUNTER — Telehealth (INDEPENDENT_AMBULATORY_CARE_PROVIDER_SITE_OTHER): Payer: BC Managed Care – PPO | Admitting: Licensed Clinical Social Worker

## 2020-12-09 DIAGNOSIS — F411 Generalized anxiety disorder: Secondary | ICD-10-CM

## 2020-12-09 DIAGNOSIS — F322 Major depressive disorder, single episode, severe without psychotic features: Secondary | ICD-10-CM

## 2020-12-09 NOTE — Progress Notes (Signed)
[facility] Psychiatric Consultant Case Review   Stephanie Cannon is a 50 y.o. year old female with a history of depression, anxiety, insomnia, hypertension, back pain with left sided radiculopathy.  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  She denies significant mood symptoms when she does not work, and has good relationship with her husband.  BuSpar was started by PCP.   Assessment/Provisional Diagnosis #Unspecified anxiety disorder Will continue current medication regimen given BuSpar was recently started.   Recommendation  -Continue fluoxetine 40 mg daily -Continue BuSpar 7.5 mg 3 times a day -On Xanax 1 mg at night.  Will recommend using this medication only for short-term to avoid long-term side effect.  -BS specialist to offer problem solving therapy  Thank you for your consult. We will continue to follow the patient. Please contact [facility]  for any questions or concerns.   The above treatment considerations and suggestions are based on consultation with the [facility] specialist and/or PCP and a review of information available in the shared registry and the patient's [facility] Record (EHR). I have not personally examined the patient. All recommendations should be implemented with consideration of the patient's relevant prior history and current clinical status. Please feel free to call me with any questions about the care of this patient.

## 2020-12-09 NOTE — BH Specialist Note (Signed)
Virtual Behavioral Health Treatment Plan Team Note  MRN: [id number] NAME: Stephanie Cannon  DATE: 12/14/20  Start time:  325p End time:  330p Total time:  5 min  Total number of Virtual [place] Treatment Team Plan encounters: 1/4  Treatment Team Attendees: Royal Piedra, LCSW & Dr. Modesta Messing  Diagnoses:    ICD-10-CM   1. GAD (generalized anxiety disorder)  F41.1     2. Depression, major, single episode, severe (HCC)  F32.2       Goals, Interventions and Follow-up Plan Goals: Increase healthy adjustment to current life circumstances Interventions: Mindfulness or Relaxation Training CBT Cognitive Behavioral Therapy Medication Management Recommendations: no changes in medication Follow-up Plan: Biweekly VBH session  History of the present illness Presenting Problem/Current Symptoms: anxiety symptoms  Psychiatric History  Depression: Yes Anxiety: Yes Mania: No Psychosis: No PTSD symptoms: No  Past Psychiatric History/Hospitalization(s): Hospitalization for psychiatric illness: No Prior Suicide Attempts: No Prior Self-injurious behavior: No  Psychosocial stressors work  Self-harm Behaviors Risk Assessment   Screenings PHQ-9 Assessments:  Depression screen [facility] 2/9 11/25/2020 10/29/2020 07/23/2020  Decreased Interest 1 2 0  Down, Depressed, Hopeless 2 2 0  PHQ - 2 Score 3 4 0  Altered sleeping 3 3 -  Tired, decreased energy 3 3 -  Change in appetite 2 2 -  Feeling bad or failure about yourself  2 1 -  Trouble concentrating 3 2 -  Moving slowly or fidgety/restless 3 3 -  Suicidal thoughts 1 1 -  PHQ-9 Score 20 19 -  Difficult doing work/chores - - -  Some recent data might be hidden   GAD-7 Assessments:  GAD 7 : Generalized Anxiety Score 11/25/2020 10/29/2020 11/18/2019 06/18/2019  Nervous, Anxious, on Edge 3 3 2 1   Control/stop worrying 3 3 1  0  Worry too much - different things 3 3 1  0  Trouble relaxing 3 3 3 2   Restless 3 3 3 3   Easily annoyed or irritable 3  3 3 1   Afraid - awful might happen 2 3 0 0  Total GAD 7 Score 20 21 13 7   Anxiety Difficulty - - Somewhat difficult Somewhat difficult    Past Medical History Past Medical History:  Diagnosis Date   Arthritis    Asthma    [employer]rees[employer] 07/31/2019   Breast discharge    left spont white discharge x 6 months   Carpal tunnel syndrome    left, wears brace at night   Chronic back pain    GERD (gastroesophageal reflux disease)    Hyperlipidemia    Hypertension    Obesity     Vital signs: There were no vitals filed for this visit.  Allergies:  Allergies as of 12/09/2020 - Review Complete 11/30/2020  Allergen Reaction Noted   Codeine Itching 01/23/2008   Morphine Itching 01/23/2008    Medication History Current medications:  Outpatient Encounter Medications as of 12/09/2020  Medication Sig   ALPRAZolam (XANAX) 1 MG tablet Take 1 tablet (1 mg total) by mouth at bedtime.   busPIRone (BUSPAR) 7.5 MG tablet Take 1 tablet (7.5 mg total) by mouth 3 (three) times daily.   cyclobenzaprine (FLEXERIL) 10 MG tablet TAKE 1 TABLET BY MOUTH AT BEDTIME.   ergocalciferol (VITAMIN D2) 1.25 MG (50000 UT) capsule Take 1 capsule (50,000 Units total) by mouth once a week. One capsule once weekly   esomeprazole (NEXIUM) 40 MG capsule TAKE ONE CAPSULE BY MOUTH ONCE DAILY.   FLUoxetine (PROZAC) 40 MG capsule Take 1 capsule (  40 mg total) by mouth daily.   hydrOXYzine (VISTARIL) 50 MG capsule Take one to two capsules at bedtime for sleep   Multiple Vitamins-Minerals (HAIR SKIN & NAILS ADVANCED) TABS Take 1 tablet by mouth daily.   Multiple Vitamins-Minerals (MULTIVITAMIN GUMMIES ADULT PO) Take 1 each by mouth 2 (two) times daily.   potassium chloride (KLOR-CON) 10 MEQ tablet TAKE ONE TABLET BY MOUTH ONCE DAILY.   predniSONE (DELTASONE) 20 MG tablet Take 1 tablet (20 mg total) by mouth 2 (two) times daily with a meal.   triamterene-hydrochlorothiazide (MAXZIDE) 75-50 MG tablet TAKE 1 TABLET BY MOUTH ONCE DAILY.    No facility-administered encounter medications on file as of 12/09/2020.     Scribe for Treatment Team: Lubertha South, LCSW

## 2020-12-15 ENCOUNTER — Ambulatory Visit (INDEPENDENT_AMBULATORY_CARE_PROVIDER_SITE_OTHER): Payer: BC Managed Care – PPO | Admitting: Licensed Clinical Social Worker

## 2020-12-15 ENCOUNTER — Other Ambulatory Visit: Payer: Self-pay

## 2020-12-15 DIAGNOSIS — F411 Generalized anxiety disorder: Secondary | ICD-10-CM

## 2020-12-16 ENCOUNTER — Encounter: Payer: Self-pay | Admitting: Family Medicine

## 2020-12-16 NOTE — Progress Notes (Signed)
Reschedule; provider

## 2020-12-22 ENCOUNTER — Other Ambulatory Visit: Payer: Self-pay | Admitting: Internal Medicine

## 2020-12-22 ENCOUNTER — Other Ambulatory Visit: Payer: Self-pay | Admitting: Family Medicine

## 2020-12-24 ENCOUNTER — Telehealth: Payer: Self-pay | Admitting: Family Medicine

## 2020-12-24 ENCOUNTER — Other Ambulatory Visit: Payer: Self-pay

## 2020-12-24 DIAGNOSIS — M62830 Muscle spasm of back: Secondary | ICD-10-CM

## 2020-12-24 MED ORDER — CYCLOBENZAPRINE HCL 10 MG PO TABS: 10.0000 mg | ORAL_TABLET | Freq: Every day | ORAL | 0 refills | Status: DC

## 2020-12-24 NOTE — Telephone Encounter (Signed)
Please send in Flexerill to the [place]

## 2020-12-24 NOTE — Telephone Encounter (Signed)
Med refilled.

## 2020-12-28 ENCOUNTER — Encounter: Payer: Self-pay | Admitting: Family Medicine

## 2020-12-29 ENCOUNTER — Ambulatory Visit (INDEPENDENT_AMBULATORY_CARE_PROVIDER_SITE_OTHER): Payer: BC Managed Care – PPO | Admitting: Licensed Clinical Social Worker

## 2020-12-29 ENCOUNTER — Other Ambulatory Visit: Payer: Self-pay

## 2020-12-29 DIAGNOSIS — F419 Anxiety disorder, unspecified: Secondary | ICD-10-CM

## 2020-12-30 ENCOUNTER — Telehealth: Payer: Self-pay | Admitting: Family Medicine

## 2020-12-30 NOTE — Telephone Encounter (Signed)
Pt called in regard to nebulizer,   Pt has a nebulizer , and has lost the mouth piece   Pt would like a prescription for nebulizer so she can get a mouth piece

## 2021-01-01 ENCOUNTER — Other Ambulatory Visit: Payer: Self-pay | Admitting: *Deleted

## 2021-01-01 MED ORDER — NEBULIZER/TUBING/MOUTHPIECE KIT: PACK | 0 refills | Status: DC

## 2021-01-01 NOTE — Telephone Encounter (Signed)
Sent to [facility] 

## 2021-01-05 NOTE — BH Specialist Note (Signed)
[place] Follow Up Assessment  MRN: [id number] NAME: Stephanie Cannon Date: 01/05/21  Start time: 31 End time: 1 Total time: 30  Type of Contact: Follow up Call  Current concerns/stressors: none at this time   Functional Assessment:  Sleep: WNL Appetite: fair Coping ability: WNL Patient taking medications as prescribed:  yes  Current medications:  Outpatient Encounter Medications as of 12/29/2020  Medication Sig   ALPRAZolam (XANAX) 1 MG tablet Take 1 tablet (1 mg total) by mouth at bedtime.   busPIRone (BUSPAR) 7.5 MG tablet Take 1 tablet (7.5 mg total) by mouth 3 (three) times daily.   cyclobenzaprine (FLEXERIL) 10 MG tablet Take 1 tablet (10 mg total) by mouth at bedtime.   ergocalciferol (VITAMIN D2) 1.25 MG (50000 UT) capsule Take 1 capsule (50,000 Units total) by mouth once a week. One capsule once weekly   esomeprazole (NEXIUM) 40 MG capsule TAKE ONE CAPSULE BY MOUTH ONCE DAILY.   FLUoxetine (PROZAC) 40 MG capsule Take 1 capsule (40 mg total) by mouth daily.   hydrOXYzine (VISTARIL) 50 MG capsule Take one to two capsules at bedtime for sleep   Multiple Vitamins-Minerals (HAIR SKIN & NAILS ADVANCED) TABS Take 1 tablet by mouth daily.   Multiple Vitamins-Minerals (MULTIVITAMIN GUMMIES ADULT PO) Take 1 each by mouth 2 (two) times daily.   potassium chloride (KLOR-CON) 10 MEQ tablet TAKE ONE TABLET BY MOUTH ONCE DAILY.   predniSONE (DELTASONE) 20 MG tablet Take 1 tablet (20 mg total) by mouth 2 (two) times daily with a meal.   triamterene-hydrochlorothiazide (MAXZIDE) 75-50 MG tablet TAKE 1 TABLET BY MOUTH ONCE DAILY.   No facility-administered encounter medications on file as of 12/29/2020.    Self-harm and/or Suicidal Behaviors Risk Assessment Self-harm risk factors: no Patient endorses recent self injurious thoughts and/or behaviors: No   Suicide ideations: No plan to harm self or others   Danger to Others Risk Assessment Danger to others risk  factors: no Patient endorses recent thoughts of harming others: No    Substance Use Assessment Patient recently consumed alcohol: No  Patient recently used drugs: No  Patient is concerned about dependence or abuse of substances: No    Goals, Interventions and Follow-up Plan Goals: Increase healthy adjustment to current life circumstances Interventions: Mindfulness or Relaxation Training   Summary of Stephanie Cannon is a 51 yr old who was present for follow up.  She reports a reduction in anxiety during Christmas break.  She denies any symptoms of anxiety the past 2 weeks.  Discussion of relaxation techniques while at work and encouraged her to practice.  She is open to taking her breaks while at work to reduce anxiety level.  WIll follow up in 2 weeks    Follow-up Plan:  biweekly VBH services Lubertha South, LCSW

## 2021-01-05 NOTE — Progress Notes (Signed)
See [facility] Specialist note

## 2021-01-07 ENCOUNTER — Ambulatory Visit (INDEPENDENT_AMBULATORY_CARE_PROVIDER_SITE_OTHER): Payer: BC Managed Care – PPO | Admitting: Licensed Clinical Social Worker

## 2021-01-07 ENCOUNTER — Other Ambulatory Visit: Payer: Self-pay

## 2021-01-07 DIAGNOSIS — F419 Anxiety disorder, unspecified: Secondary | ICD-10-CM

## 2021-01-08 ENCOUNTER — Ambulatory Visit: Payer: BC Managed Care – PPO | Attending: Family

## 2021-01-08 ENCOUNTER — Ambulatory Visit: Payer: BC Managed Care – PPO | Admitting: Family Medicine

## 2021-01-08 DIAGNOSIS — Z23 Encounter for immunization: Secondary | ICD-10-CM

## 2021-01-08 NOTE — Progress Notes (Signed)
° °  Covid-19 Vaccination Clinic  Name:  Stephanie Cannon    MRN: [id number] DOB: 03-16-1970  01/08/2021  Stephanie Cannon was observed post Covid-19 immunization for 15 minutes without incident. She was provided with Vaccine Information Sheet and instruction to access the V-Safe system.   Stephanie Cannon was instructed to call 911 with any severe reactions post vaccine: Difficulty breathing  Swelling of face and throat  A fast heartbeat  A bad rash all over body  Dizziness and weakness   Immunizations Administered     Name Date Dose VIS Date Route   Moderna Covid-19 vaccine Bivalent Booster 01/08/2021  3:00 PM 0.5 mL 08/15/2020 Intramuscular   Manufacturer: Moderna   Lot: [id number]   [place]: [id number]

## 2021-01-08 NOTE — BH Specialist Note (Signed)
Error appointment is scheduled for Jan 15, 2020

## 2021-01-13 ENCOUNTER — Telehealth (INDEPENDENT_AMBULATORY_CARE_PROVIDER_SITE_OTHER): Payer: BC Managed Care – PPO | Admitting: Licensed Clinical Social Worker

## 2021-01-13 DIAGNOSIS — F322 Major depressive disorder, single episode, severe without psychotic features: Secondary | ICD-10-CM

## 2021-01-13 DIAGNOSIS — F411 Generalized anxiety disorder: Secondary | ICD-10-CM

## 2021-01-13 NOTE — BH Specialist Note (Signed)
Virtual Behavioral Health Treatment Plan Team Note  MRN: [id number] NAME: Stephanie Cannon  DATE: 01/18/21  Start time:   225p End time:  230p Total time:  5 min  Total number of Virtual [place] Treatment Team Plan encounters: 2/4  Treatment Team Attendees: Royal Piedra, LCSW & Dr. Modesta Messing  Diagnoses:    ICD-10-CM   1. GAD (generalized anxiety disorder)  F41.1     2. Depression, major, single episode, severe (HCC)  F32.2       Goals, Interventions and Follow-up Plan Goals: Increase healthy adjustment to current life circumstances Interventions: Mindfulness or Relaxation Training Medication Management Recommendations: no change in recommendation Follow-up Plan: biweekly VBH services  History of the present illness Presenting Problem/Current Symptoms: symptoms of anxiety  Psychiatric History  Depression: No Anxiety: No Mania: No Psychosis: No PTSD symptoms: No  Past Psychiatric History/Hospitalization(s): Hospitalization for psychiatric illness: No Prior Suicide Attempts: No Prior Self-injurious behavior: No  Psychosocial stressors work  Self-harm Behaviors Risk Assessment   Screenings PHQ-9 Assessments:  Depression screen [facility] 2/9 11/25/2020 10/29/2020 07/23/2020  Decreased Interest 1 2 0  Down, Depressed, Hopeless 2 2 0  PHQ - 2 Score 3 4 0  Altered sleeping 3 3 -  Tired, decreased energy 3 3 -  Change in appetite 2 2 -  Feeling bad or failure about yourself  2 1 -  Trouble concentrating 3 2 -  Moving slowly or fidgety/restless 3 3 -  Suicidal thoughts 1 1 -  PHQ-9 Score 20 19 -  Difficult doing work/chores - - -  Some recent data might be hidden   GAD-7 Assessments:  GAD 7 : Generalized Anxiety Score 11/25/2020 10/29/2020 11/18/2019 06/18/2019  Nervous, Anxious, on Edge 3 3 2 1   Control/stop worrying 3 3 1  0  Worry too much - different things 3 3 1  0  Trouble relaxing 3 3 3 2   Restless 3 3 3 3   Easily annoyed or irritable 3 3 3 1   Afraid - awful might  happen 2 3 0 0  Total GAD 7 Score 20 21 13 7   Anxiety Difficulty - - Somewhat difficult Somewhat difficult    Past Medical History Past Medical History:  Diagnosis Date   Arthritis    Asthma    Phreesia 07/31/2019   Breast discharge    left spont white discharge x 6 months   Carpal tunnel syndrome    left, wears brace at night   Chronic back pain    GERD (gastroesophageal reflux disease)    Hyperlipidemia    Hypertension    Obesity     Vital signs: There were no vitals filed for this visit.  Allergies:  Allergies as of 01/13/2021 - Review Complete 11/30/2020  Allergen Reaction Noted   Codeine Itching 01/23/2008   Morphine Itching 01/23/2008    Medication History Current medications:  Outpatient Encounter Medications as of 01/13/2021  Medication Sig   ALPRAZolam (XANAX) 1 MG tablet Take 1 tablet (1 mg total) by mouth at bedtime.   busPIRone (BUSPAR) 7.5 MG tablet Take 1 tablet (7.5 mg total) by mouth 3 (three) times daily.   cyclobenzaprine (FLEXERIL) 10 MG tablet Take 1 tablet (10 mg total) by mouth at bedtime.   ergocalciferol (VITAMIN D2) 1.25 MG (50000 UT) capsule Take 1 capsule (50,000 Units total) by mouth once a week. One capsule once weekly   esomeprazole (NEXIUM) 40 MG capsule TAKE ONE CAPSULE BY MOUTH ONCE DAILY.   FLUoxetine (PROZAC) 40 MG capsule Take 1  capsule (40 mg total) by mouth daily.   hydrOXYzine (VISTARIL) 50 MG capsule Take one to two capsules at bedtime for sleep   Multiple Vitamins-Minerals (HAIR SKIN & NAILS ADVANCED) TABS Take 1 tablet by mouth daily.   Multiple Vitamins-Minerals (MULTIVITAMIN GUMMIES ADULT PO) Take 1 each by mouth 2 (two) times daily.   potassium chloride (KLOR-CON) 10 MEQ tablet TAKE ONE TABLET BY MOUTH ONCE DAILY.   predniSONE (DELTASONE) 20 MG tablet Take 1 tablet (20 mg total) by mouth 2 (two) times daily with a meal.   Respiratory Therapy Supplies (NEBULIZER/TUBING/MOUTHPIECE) KIT Tubing and mouthpiece needed    triamterene-hydrochlorothiazide (MAXZIDE) 75-50 MG tablet TAKE 1 TABLET BY MOUTH ONCE DAILY.   No facility-administered encounter medications on file as of 01/13/2021.     Scribe for Treatment Team: Lubertha South, LCSW

## 2021-01-14 ENCOUNTER — Other Ambulatory Visit: Payer: Self-pay

## 2021-01-14 ENCOUNTER — Ambulatory Visit (INDEPENDENT_AMBULATORY_CARE_PROVIDER_SITE_OTHER): Payer: BC Managed Care – PPO | Admitting: Licensed Clinical Social Worker

## 2021-01-14 ENCOUNTER — Telehealth: Payer: BC Managed Care – PPO

## 2021-01-14 DIAGNOSIS — F411 Generalized anxiety disorder: Secondary | ICD-10-CM

## 2021-01-19 ENCOUNTER — Ambulatory Visit (INDEPENDENT_AMBULATORY_CARE_PROVIDER_SITE_OTHER): Payer: BC Managed Care – PPO | Admitting: Family Medicine

## 2021-01-19 ENCOUNTER — Other Ambulatory Visit: Payer: Self-pay

## 2021-01-19 ENCOUNTER — Encounter: Payer: Self-pay | Admitting: Family Medicine

## 2021-01-19 VITALS — BP 130/80 | HR 81 | Resp 16 | Ht 61.0 in | Wt 206.0 lb

## 2021-01-19 DIAGNOSIS — F5104 Psychophysiologic insomnia: Secondary | ICD-10-CM

## 2021-01-19 DIAGNOSIS — I10 Essential (primary) hypertension: Secondary | ICD-10-CM

## 2021-01-19 DIAGNOSIS — F411 Generalized anxiety disorder: Secondary | ICD-10-CM

## 2021-01-19 DIAGNOSIS — F322 Major depressive disorder, single episode, severe without psychotic features: Secondary | ICD-10-CM | POA: Diagnosis not present

## 2021-01-19 MED ORDER — AZITHROMYCIN 250 MG PO TABS: ORAL_TABLET | ORAL | 0 refills | Status: AC

## 2021-01-19 MED ORDER — BUSPIRONE HCL 15 MG PO TABS: 15.0000 mg | ORAL_TABLET | Freq: Three times a day (TID) | ORAL | 3 refills | Status: DC

## 2021-01-19 NOTE — Patient Instructions (Addendum)
F/U in 10 weeks, call if you need me sooner  Increase buspar 15 mg one three times daily  You are being referred to Psychiatry for medication management  Please get fasting labs as soon as possible, on your job is gfine, just bring results  It is important that you exercise regularly at least 30 minutes 5 times a week. If you develop chest pain, have severe difficulty breathing, or feel very tired, stop exercising immediately and seek medical attention  Thanks for choosing [facility], we consider it a privelige to serve you.

## 2021-01-20 ENCOUNTER — Telehealth (INDEPENDENT_AMBULATORY_CARE_PROVIDER_SITE_OTHER): Payer: BC Managed Care – PPO | Admitting: Licensed Clinical Social Worker

## 2021-01-20 ENCOUNTER — Telehealth: Payer: Self-pay

## 2021-01-20 ENCOUNTER — Encounter: Payer: Self-pay | Admitting: Family Medicine

## 2021-01-20 DIAGNOSIS — F322 Major depressive disorder, single episode, severe without psychotic features: Secondary | ICD-10-CM

## 2021-01-20 NOTE — BH Specialist Note (Signed)
[place] Follow Up Assessment  MRN: [id number] NAME: Stephanie Cannon Date: 01/20/21  Start time: 12 End time: 1220 Total time: 20  Type of Contact: Follow up Call  Current concerns/stressors: work frustrations    Functional Assessment:  Sleep: fair Appetite: fair Coping ability: poor Patient taking medications as prescribed:    Current medications:  Outpatient Encounter Medications as of 01/14/2021  Medication Sig   ALPRAZolam (XANAX) 1 MG tablet Take 1 tablet (1 mg total) by mouth at bedtime.   cyclobenzaprine (FLEXERIL) 10 MG tablet Take 1 tablet (10 mg total) by mouth at bedtime.   ergocalciferol (VITAMIN D2) 1.25 MG (50000 UT) capsule Take 1 capsule (50,000 Units total) by mouth once a week. One capsule once weekly   esomeprazole (NEXIUM) 40 MG capsule TAKE ONE CAPSULE BY MOUTH ONCE DAILY.   FLUoxetine (PROZAC) 40 MG capsule Take 1 capsule (40 mg total) by mouth daily.   Multiple Vitamins-Minerals (HAIR SKIN & NAILS ADVANCED) TABS Take 1 tablet by mouth daily.   Multiple Vitamins-Minerals (MULTIVITAMIN GUMMIES ADULT PO) Take 1 each by mouth 2 (two) times daily.   potassium chloride (KLOR-CON) 10 MEQ tablet TAKE ONE TABLET BY MOUTH ONCE DAILY.   Respiratory Therapy Supplies (NEBULIZER/TUBING/MOUTHPIECE) KIT Tubing and mouthpiece needed   triamterene-hydrochlorothiazide (MAXZIDE) 75-50 MG tablet TAKE 1 TABLET BY MOUTH ONCE DAILY.   [DISCONTINUED] busPIRone (BUSPAR) 7.5 MG tablet Take 1 tablet (7.5 mg total) by mouth 3 (three) times daily.   [DISCONTINUED] hydrOXYzine (VISTARIL) 50 MG capsule Take one to two capsules at bedtime for sleep   [DISCONTINUED] predniSONE (DELTASONE) 20 MG tablet Take 1 tablet (20 mg total) by mouth 2 (two) times daily with a meal.   No facility-administered encounter medications on file as of 01/14/2021.    Self-harm and/or Suicidal Behaviors Risk Assessment Self-harm risk factors: no Patient endorses recent self injurious thoughts  and/or behaviors: No   Suicide ideations: No plan to harm self or others   Danger to Others Risk Assessment Danger to others risk factors: no Patient endorses recent thoughts of harming others: No    Substance Use Assessment Patient recently consumed alcohol: No  Patient recently used drugs: No  Patient is concerned about dependence or abuse of substances: No    Goals, Interventions and Follow-up Plan Goals: Increase healthy adjustment to current life circumstances Interventions: Mindfulness or Relaxation Training, Behavioral Activation, and CBT Cognitive Behavioral Therapy   Summary of Clinical Assessment  Stephanie Cannon is a 51 yr old woman that was present for her session.  She reports that she is frustrated with her current job and not having assistance with her current duties.  She reports that she is easily frustrated by attempting to do the work on her own when she is aware that she is unable to.  Discussion on relaxation and taking appropriate breaks while at work. Client discussed examples of situations where there are difficulties with boundaries and boundary setting and self-assertion.    Follow-up Plan:  biweekly vbh session Lubertha South, LCSW

## 2021-01-20 NOTE — Telephone Encounter (Signed)
Please call Stephanie Cannon about her disability form.  I do not have anything in my office pending on her. [phone]

## 2021-01-20 NOTE — Progress Notes (Signed)
Virtual behavioral Health Initiative [facility]) Psychiatric Consultant Case Review    Stephanie Cannon is a 51 y.o. year old female with a history of depression, anxiety, insomnia, hypertension, back pain with left sided radiculopathy.  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  She denies significant mood symptoms outside of work, and during holiday when she did not work. She has good relationship with her husband.  BuSpar was uptitrated by PCP.    Assessment/Provisional Diagnosis # Unspecified anxiety disorder Her anxiety is more situational based on the interview.  It is likely that therapy will be more helpful than pharmacological treatment. [place]  specialist to continue to work on problem solving therapy.    Recommendation  -Continue fluoxetine 40 mg daily - Continue BuSpar 15 mg 3 times a day -On Xanax 1 mg at night.  Will recommend using this medication only for short-term to avoid long-term side effect.  -[place] specialist to continue to offer problem solving therapy   Thank you for your consult. We will continue to follow the patient. Please contact [name]  for any questions or concerns.    The above treatment considerations and suggestions are based on consultation with the [facility] specialist and/or PCP and a review of information available in the shared registry and the patients [place] (EHR). I have not personally examined the patient. All recommendations should be implemented with consideration of the patient's relevant prior history and current clinical status. Please feel free to call me with any questions about the care of this patient.

## 2021-01-20 NOTE — Progress Notes (Signed)
See [place] note

## 2021-01-20 NOTE — BH Specialist Note (Signed)
Virtual Behavioral Health Treatment Plan Team Note  MRN: [id number] NAME: ARLA BOUTWELL  DATE: 01/20/21  Start time:  320p End time:  325p Total time:  5 min  Total number of Virtual [place] Treatment Team Plan encounters: 3/4  Treatment Team Attendees: Royal Piedra, LCSW & Dr. Modesta Messing  Diagnoses: No diagnosis found.  Goals, Interventions and Follow-up Plan Goals: Increase healthy adjustment to current life circumstances Interventions: Mindfulness or Relaxation Training Behavioral Activation CBT Cognitive Behavioral Therapy Medication Management Recommendations: no changes to medication Follow-up Plan: biweekly vbh session  History of the present illness Presenting Problem/Current Symptoms: continued symptoms of  DX  Psychiatric History  Depression: No Anxiety: Yes Mania: No Psychosis: No PTSD symptoms: No  Past Psychiatric History/Hospitalization(s): Hospitalization for psychiatric illness: No Prior Suicide Attempts: No Prior Self-injurious behavior: No  Psychosocial stressors work  Self-harm Behaviors Risk Assessment none  Screenings PHQ-9 Assessments:  Depression screen [facility] 2/9 01/19/2021 11/25/2020 10/29/2020  Decreased Interest 2 1 2   Down, Depressed, Hopeless 2 2 2   PHQ - 2 Score 4 3 4   Altered sleeping 3 3 3   Tired, decreased energy 3 3 3   Change in appetite 3 2 2   Feeling bad or failure about yourself  1 2 1   Trouble concentrating 3 3 2   Moving slowly or fidgety/restless 2 3 3   Suicidal thoughts 1 1 1   PHQ-9 Score 20 20 19   Difficult doing work/chores - - -  Some recent data might be hidden   GAD-7 Assessments:  GAD 7 : Generalized Anxiety Score 01/19/2021 11/25/2020 10/29/2020 11/18/2019  Nervous, Anxious, on Edge 2 3 3 2   Control/stop worrying 2 3 3 1   Worry too much - different things 2 3 3 1   Trouble relaxing 3 3 3 3   Restless 3 3 3 3   Easily annoyed or irritable 3 3 3 3   Afraid - awful might happen 3 2 3  0  Total GAD 7 Score 18 20 21 13    Anxiety Difficulty - - - Somewhat difficult    Past Medical History Past Medical History:  Diagnosis Date   Arthritis    Asthma    [employer]reesia 07/31/2019   Breast discharge    left spont white discharge x 6 months   Carpal tunnel syndrome    left, wears brace at night   Chronic back pain    GERD (gastroesophageal reflux disease)    Hyperlipidemia    Hypertension    Obesity     Vital signs: There were no vitals filed for this visit.  Allergies:  Allergies as of 01/20/2021 - Review Complete 11/30/2020  Allergen Reaction Noted   Codeine Itching 01/23/2008   Morphine Itching 01/23/2008    Medication History Current medications:  Outpatient Encounter Medications as of 01/20/2021  Medication Sig   ALPRAZolam (XANAX) 1 MG tablet Take 1 tablet (1 mg total) by mouth at bedtime.   azithromycin (ZITHROMAX) 250 MG tablet Take 2 tablets on day 1, then 1 tablet daily on days 2 through 5   busPIRone (BUSPAR) 15 MG tablet Take 1 tablet (15 mg total) by mouth 3 (three) times daily.   cyclobenzaprine (FLEXERIL) 10 MG tablet Take 1 tablet (10 mg total) by mouth at bedtime.   ergocalciferol (VITAMIN D2) 1.25 MG (50000 UT) capsule Take 1 capsule (50,000 Units total) by mouth once a week. One capsule once weekly   esomeprazole (NEXIUM) 40 MG capsule TAKE ONE CAPSULE BY MOUTH ONCE DAILY.   FLUoxetine (PROZAC) 40 MG capsule Take 1  capsule (40 mg total) by mouth daily.   Multiple Vitamins-Minerals (HAIR SKIN & NAILS ADVANCED) TABS Take 1 tablet by mouth daily.   Multiple Vitamins-Minerals (MULTIVITAMIN GUMMIES ADULT PO) Take 1 each by mouth 2 (two) times daily.   potassium chloride (KLOR-CON) 10 MEQ tablet TAKE ONE TABLET BY MOUTH ONCE DAILY.   Respiratory Therapy Supplies (NEBULIZER/TUBING/MOUTHPIECE) KIT Tubing and mouthpiece needed   triamterene-hydrochlorothiazide (MAXZIDE) 75-50 MG tablet TAKE 1 TABLET BY MOUTH ONCE DAILY.   No facility-administered encounter medications on file as of  01/20/2021.     Scribe for Treatment Team: Lubertha South, LCSW

## 2021-01-21 ENCOUNTER — Encounter: Payer: Self-pay | Admitting: Family Medicine

## 2021-01-21 NOTE — Assessment & Plan Note (Signed)
No response tocurrent med, refer psych asap, not suicidal or homicidal, continue therapy

## 2021-01-21 NOTE — Assessment & Plan Note (Signed)
Increase buspar dose and refer to Psych asap

## 2021-01-21 NOTE — Assessment & Plan Note (Signed)
Controlled, no change in medication DASH diet and commitment to daily physical activity for a minimum of 30 minutes discussed and encouraged, as a part of hypertension management. The importance of attaining a healthy weight is also discussed.  BP/Weight 01/19/2021 11/25/2020 10/29/2020 07/23/2020 04/29/2020 02/06/2020 83/50/7573  Systolic BP 225 672 091 980 221 - 798  Diastolic BP 80 81 82 80 72 - 74  Wt. (Lbs) 206 209 208.4 199 206 200 207  BMI 38.92 39.49 39.38 37.6 38.92 37.79 39.11  Some encounter information is confidential and restricted. Go to Review Flowsheets activity to see all data.

## 2021-01-21 NOTE — Assessment & Plan Note (Signed)
°  Patient re-educated about  the importance of commitment to a  minimum of 150 minutes of exercise per week as able.  The importance of healthy food choices with portion control discussed, as well as eating regularly and within a 12 hour window most days. The need to choose "clean , green" food 50 to 75% of the time is discussed, as well as to make water the primary drink and set a goal of 64 ounces water daily.    Weight /BMI 01/19/2021 11/25/2020 10/29/2020  WEIGHT 206 lb 209 lb 208 lb 6.4 oz  HEIGHT 5\' 1"  5\' 1"  5\' 1"   BMI 38.92 kg/m2 39.49 kg/m2 39.38 kg/m2  Some encounter information is confidential and restricted. Go to Review Flowsheets activity to see all data.

## 2021-01-21 NOTE — Progress Notes (Signed)
° °  Stephanie Cannon     MRN: [id number]      DOB: [birth year]   HPI [name] is here for follow up and re-evaluation of chronic medical conditions, medication management and review of any available recent lab and radiology data.  Preventive health is updated, specifically  Cancer screening and Immunization.   C/o cough x 10 days, sputum yellow at ime, still productive , had chills. C/o poor sleep, uncontrolled emotions, depression and anxiety, benefiting from therapy, not suicidal or homicidal, but "goes off" often ROS  Denies chest pains, palpitations and leg swelling Denies abdominal pain, nausea, vomiting,diarrhea or constipation.   Denies dysuria, frequency, hesitancy or incontinence. Denies joint pain, swelling and limitation in mobility. Denies headaches, seizures, numbness, or tingling. . Denies skin break down or rash.   PE  BP 130/80    Pulse 81    Resp 16    Ht 5\' 1"  (1.549 m)    Wt 206 lb (93.4 kg)    SpO2 99%    BMI 38.92 kg/m   Patient alert and oriented and in no cardiopulmonary distress.  HEENT: No facial asymmetry, EOMI,     Neck supple .  Chest: bilateral crackles , no wheezes  CVS: S1, S2 no murmurs, no S3.Regular rate.  ABD: Soft non tender.   Ext: No edema  MS: Adequate ROM spine, shoulders, hips and knees.  Skin: Intact, no ulcerations or rash noted. CNS: CN 2-12 intact, power,  normal throughout.no focal deficits noted.   Assessment & Plan  Depression, major, single episode, severe ([place]) No response tocurrent med, refer psych asap, not suicidal or homicidal, continue therapy  GAD (generalized anxiety disorder) Increase buspar dose and refer to Psych asap  Morbid obesity ([place])  Patient re-educated about  the importance of commitment to a  minimum of 150 minutes of exercise per week as able.  The importance of healthy food choices with portion control discussed, as well as eating regularly and within a 12 hour window most days. The need to  choose "clean , green" food 50 to 75% of the time is discussed, as well as to make water the primary drink and set a goal of 64 ounces water daily.    Weight /BMI 01/19/2021 11/25/2020 10/29/2020  WEIGHT 206 lb 209 lb 208 lb 6.4 oz  HEIGHT 5\' 1"  5\' 1"  5\' 1"   BMI 38.92 kg/m2 39.49 kg/m2 39.38 kg/m2  Some encounter information is confidential and restricted. Go to Review Flowsheets activity to see all data.      Essential hypertension Controlled, no change in medication DASH diet and commitment to daily physical activity for a minimum of 30 minutes discussed and encouraged, as a part of hypertension management. The importance of attaining a healthy weight is also discussed.  BP/Weight 01/19/2021 11/25/2020 10/29/2020 07/23/2020 04/29/2020 02/06/2020 25/85/2778  Systolic BP 242 353 614 431 540 - 086  Diastolic BP 80 81 82 80 72 - 74  Wt. (Lbs) 206 209 208.4 199 206 200 207  BMI 38.92 39.49 39.38 37.6 38.92 37.79 39.11  Some encounter information is confidential and restricted. Go to Review Flowsheets activity to see all data.

## 2021-01-22 ENCOUNTER — Encounter: Payer: Self-pay | Admitting: Family Medicine

## 2021-01-22 NOTE — Telephone Encounter (Signed)
Printed

## 2021-01-22 NOTE — Telephone Encounter (Signed)
Do you have any forms in your office on [address]?

## 2021-01-28 ENCOUNTER — Ambulatory Visit (INDEPENDENT_AMBULATORY_CARE_PROVIDER_SITE_OTHER): Payer: BC Managed Care – PPO | Admitting: Licensed Clinical Social Worker

## 2021-01-28 ENCOUNTER — Other Ambulatory Visit: Payer: Self-pay

## 2021-01-28 DIAGNOSIS — F322 Major depressive disorder, single episode, severe without psychotic features: Secondary | ICD-10-CM

## 2021-02-10 ENCOUNTER — Ambulatory Visit (HOSPITAL_COMMUNITY): Payer: Self-pay | Admitting: Psychiatry

## 2021-02-10 ENCOUNTER — Encounter (HOSPITAL_COMMUNITY): Payer: Self-pay

## 2021-02-10 ENCOUNTER — Encounter: Payer: Self-pay | Admitting: Family Medicine

## 2021-02-11 ENCOUNTER — Encounter: Payer: Self-pay | Admitting: Family Medicine

## 2021-02-12 ENCOUNTER — Other Ambulatory Visit: Payer: Self-pay

## 2021-02-12 DIAGNOSIS — I10 Essential (primary) hypertension: Secondary | ICD-10-CM

## 2021-02-13 LAB — TSH: TSH: 1.07 u[IU]/mL (ref 0.450–4.500)

## 2021-02-17 LAB — CMP14+EGFR
ALT: 15 IU/L (ref 0–32)
AST: 22 IU/L (ref 0–40)
Albumin/Globulin Ratio: 1.4 (ref 1.2–2.2)
Albumin: 4.2 g/dL (ref 3.8–4.9)
Alkaline Phosphatase: 94 IU/L (ref 44–121)
BUN/Creatinine Ratio: 15 (ref 9–23)
BUN: 14 mg/dL (ref 6–24)
Bilirubin Total: 0.3 mg/dL (ref 0.0–1.2)
CO2: 26 mmol/L (ref 20–29)
Calcium: 9.8 mg/dL (ref 8.7–10.2)
Chloride: 102 mmol/L (ref 96–106)
Creatinine, Ser: 0.96 mg/dL (ref 0.57–1.00)
Globulin, Total: 3.1 g/dL (ref 1.5–4.5)
Glucose: 73 mg/dL (ref 70–99)
Potassium: 4 mmol/L (ref 3.5–5.2)
Sodium: 144 mmol/L (ref 134–144)
Total Protein: 7.3 g/dL (ref 6.0–8.5)
eGFR: 72 mL/min/{1.73_m2} (ref >59)

## 2021-02-17 LAB — LIPID PANEL
Chol/HDL Ratio: 3.5 ratio (ref 0.0–4.4)
Cholesterol, Total: 239 mg/dL — ABNORMAL HIGH (ref 100–199)
HDL: 69 mg/dL (ref >39)
LDL Chol Calc (NIH): 157 mg/dL — ABNORMAL HIGH (ref 0–99)
Triglycerides: 74 mg/dL (ref 0–149)
VLDL Cholesterol Cal: 13 mg/dL (ref 5–40)

## 2021-02-17 LAB — HEMOGLOBIN A1C
Est. average glucose Bld gHb Est-mCnc: 108 mg/dL
Hgb A1c MFr Bld: 5.4 % (ref 4.8–5.6)

## 2021-02-17 LAB — VITAMIN D 25 HYDROXY (VIT D DEFICIENCY, FRACTURES): Vit D, 25-Hydroxy: 49.8 ng/mL (ref 30.0–100.0)

## 2021-02-23 ENCOUNTER — Encounter: Payer: Self-pay | Admitting: Family Medicine

## 2021-03-05 ENCOUNTER — Other Ambulatory Visit: Payer: Self-pay

## 2021-03-05 ENCOUNTER — Encounter: Payer: Self-pay | Admitting: Nurse Practitioner

## 2021-03-05 ENCOUNTER — Ambulatory Visit (INDEPENDENT_AMBULATORY_CARE_PROVIDER_SITE_OTHER): Payer: BC Managed Care – PPO | Admitting: Nurse Practitioner

## 2021-03-05 DIAGNOSIS — R8290 Unspecified abnormal findings in urine: Secondary | ICD-10-CM | POA: Insufficient documentation

## 2021-03-05 LAB — POCT URINALYSIS DIP (CLINITEK)
Bilirubin, UA: NEGATIVE
Glucose, UA: NEGATIVE mg/dL
Ketones, POC UA: NEGATIVE mg/dL
Leukocytes, UA: NEGATIVE
Nitrite, UA: NEGATIVE
Spec Grav, UA: >=1.03 — AB (ref 1.010–1.025)
Urobilinogen, UA: 0.2 E.U./dL
pH, UA: 6.5 (ref 5.0–8.0)

## 2021-03-05 NOTE — Progress Notes (Signed)
Negative for UTI,

## 2021-03-05 NOTE — Assessment & Plan Note (Signed)
Abnormal Urinary odor ?Urine dipstick shows negative nitrites negative leukocyte small blood SG greater than 1.030 ?Results discussed with patient. ?No concerns for UTI. ?Patient encouraged to drink at least 64 ounces of water daily, she verbalized understandin ?

## 2021-03-05 NOTE — Progress Notes (Signed)
Virtual Visit via Telephone Note ? ?I connected with Stephanie Cannon @ on 03/05/21 at 1:34 by telephone and verified that I am speaking with the correct person using two identifiers.  I spent 6 minutes on this telephone encounter.  ? ?Location: ?Patient: home ?Provider: office ?  ?I discussed the limitations, risks, security and privacy concerns of performing an evaluation and management service by telephone and the availability of in person appointments. I also discussed with the patient that there may be a patient responsible charge related to this service. The patient expressed understanding and agreed to proceed. ? ? ?History of Present Illness: ?Pt c/o urine with strong odor, went to urologist some years ago and she was told that when someone is constipated it can sometimes make urine smelly. She stated that she has not been enough water. Pt denies dysuria, urinary frequency , abdominal pain, hesitancy, fever, chills. Pt states that she is not worried about her low back pain today , just wanted her urine looked at.  ?  ? ? ? ?Observations/Objective: ? ? ?Assessment and Plan: ?Abnormal Urinary odor ?Urine dipstick shows negative nitrites negative leukocyte small blood SG greater than 1.030.  No concern for UTI. ?Results discussed with patient. ?Patient encouraged to drink at least 64 ounces of water daily, she verbalized understanding. ? ?Follow Up Instructions: ? ?  ?I discussed the assessment and treatment plan with the patient. The patient was provided an opportunity to ask questions and all were answered. The patient agreed with the plan and demonstrated an understanding of the instructions. ?  ?The patient was advised to call back or seek an in-person evaluation if the symptoms worsen or if the condition fails to improve as anticipated.  ?

## 2021-03-09 ENCOUNTER — Other Ambulatory Visit: Payer: Self-pay | Admitting: Family Medicine

## 2021-03-16 ENCOUNTER — Encounter: Payer: Self-pay | Admitting: Family Medicine

## 2021-03-26 ENCOUNTER — Encounter: Payer: Self-pay | Admitting: Family Medicine

## 2021-03-29 ENCOUNTER — Other Ambulatory Visit: Payer: Self-pay | Admitting: Family Medicine

## 2021-04-01 ENCOUNTER — Other Ambulatory Visit: Payer: Self-pay | Admitting: Family Medicine

## 2021-04-01 DIAGNOSIS — M62830 Muscle spasm of back: Secondary | ICD-10-CM

## 2021-04-13 ENCOUNTER — Telehealth: Payer: BC Managed Care – PPO | Admitting: Family Medicine

## 2021-04-15 ENCOUNTER — Telehealth: Payer: BC Managed Care – PPO | Admitting: Family Medicine

## 2021-04-21 ENCOUNTER — Encounter: Payer: Self-pay | Admitting: Family Medicine

## 2021-04-26 ENCOUNTER — Other Ambulatory Visit: Payer: Self-pay | Admitting: Family Medicine

## 2021-05-01 ENCOUNTER — Encounter: Payer: Self-pay | Admitting: Family Medicine

## 2021-05-03 ENCOUNTER — Other Ambulatory Visit: Payer: Self-pay | Admitting: Family Medicine

## 2021-05-03 MED ORDER — ALPRAZOLAM 1 MG PO TABS: 1.0000 mg | ORAL_TABLET | Freq: Every day | ORAL | 0 refills | Status: AC

## 2021-05-14 ENCOUNTER — Ambulatory Visit (INDEPENDENT_AMBULATORY_CARE_PROVIDER_SITE_OTHER): Payer: BC Managed Care – PPO | Admitting: Family Medicine

## 2021-05-14 ENCOUNTER — Encounter: Payer: Self-pay | Admitting: Family Medicine

## 2021-05-14 VITALS — BP 130/80 | HR 85 | Resp 16 | Ht 61.0 in | Wt 208.0 lb

## 2021-05-14 DIAGNOSIS — R200 Anesthesia of skin: Secondary | ICD-10-CM | POA: Diagnosis not present

## 2021-05-14 DIAGNOSIS — M5410 Radiculopathy, site unspecified: Secondary | ICD-10-CM | POA: Diagnosis not present

## 2021-05-14 DIAGNOSIS — F322 Major depressive disorder, single episode, severe without psychotic features: Secondary | ICD-10-CM

## 2021-05-14 DIAGNOSIS — F419 Anxiety disorder, unspecified: Secondary | ICD-10-CM

## 2021-05-14 DIAGNOSIS — I10 Essential (primary) hypertension: Secondary | ICD-10-CM

## 2021-05-14 DIAGNOSIS — G4709 Other insomnia: Secondary | ICD-10-CM

## 2021-05-14 DIAGNOSIS — M542 Cervicalgia: Secondary | ICD-10-CM

## 2021-05-14 DIAGNOSIS — N3941 Urge incontinence: Secondary | ICD-10-CM

## 2021-05-14 MED ORDER — METHYLPREDNISOLONE ACETATE 80 MG/ML IJ SUSP
80.0000 mg | Freq: Once | INTRAMUSCULAR | Status: AC
  Administered 2021-05-14: 80 mg via INTRAMUSCULAR

## 2021-05-14 MED ORDER — SOLIFENACIN SUCCINATE 5 MG PO TABS: 5.0000 mg | ORAL_TABLET | Freq: Every day | ORAL | 2 refills | Status: DC

## 2021-05-14 MED ORDER — PREDNISONE 10 MG PO TABS: 10.0000 mg | ORAL_TABLET | Freq: Two times a day (BID) | ORAL | 0 refills | Status: DC

## 2021-05-14 MED ORDER — TRAZODONE HCL 50 MG PO TABS: 50.0000 mg | ORAL_TABLET | Freq: Every day | ORAL | 2 refills | Status: DC

## 2021-05-14 MED ORDER — KETOROLAC TROMETHAMINE 60 MG/2ML IM SOLN
60.0000 mg | Freq: Once | INTRAMUSCULAR | Status: AC
  Administered 2021-05-14: 60 mg via INTRAMUSCULAR

## 2021-05-14 NOTE — Progress Notes (Signed)
? ?  Stephanie Cannon     MRN: [id number]      DOB: 01-Dec-1970 ? ? ?HPI ?Stephanie Cannon is here for follow up and re-evaluation of chronic medical conditions, medication management and review of any available recent lab and radiology data.  ?Preventive health is updated, specifically  Cancer screening and Immunization.   ?. ?Left neck pain and spasm x 2 days, no inciting trauma, rated  at 7 ?Left thigh numbness x 6 months ?Urinary incontinence x 6 months, urge ?Insomnia and fatigue ?ROS ?Denies recent fever or chills. ?Denies sinus pressure, nasal congestion, ear pain or sore throat. ?Denies chest congestion, productive cough or wheezing. ?Denies chest pains, palpitations and leg swelling ?Denies abdominal pain, nausea, vomiting,diarrhea or constipation.   ?Denies skin break down or rash. ? ? ?PE ? ?BP 130/80   Pulse 85   Resp 16   Ht '5\' 1"'$  (1.549 m)   Wt 208 lb (94.3 kg)   SpO2 97%   BMI 39.30 kg/m?  ? ?Patient alert and oriented and in no cardiopulmonary distress. ? ?HEENT: No facial asymmetry, EOMI,     Neck decreased ROM with left trapezius spasm. ? ?Chest: Clear to auscultation bilaterally. ? ?CVS: S1, S2 no murmurs, no S3.Regular rate. ? ?ABD: Soft non tender.  ? ?Ext: No edema ? ?MS: Adequate though reduced ROM lumbar spine, normal in shoulders, hips and knees. ? ?Skin: Intact, no ulcerations or rash noted. ? ?Psych: Good eye contact, normal affect. Memory intact not anxious or depressed appearing. ? ?CNS: CN 2-12 intact, power,  normal throughout.decreased sensation of left anterior thigh ? ? ?Assessment & Plan ? ?Depression, major, single episode, severe ([place]) ?Score of 20 in may, 2023, no response to max dose fluoxetine, has now agreed to be treated by Psych , which is good and necesary ? ?Anxiety ?Improved on medication continue buspar ? ?Essential hypertension ?Controlled, no change in medication ?DASH diet and commitment to daily physical activity for a minimum of 30 minutes discussed and encouraged, as a  part of hypertension management. ?The importance of attaining a healthy weight is also discussed. ? ? ?  05/14/2021  ?  2:45 PM 01/19/2021  ?  5:14 PM 01/19/2021  ?  4:22 PM 11/25/2020  ?  3:30 PM 10/29/2020  ?  4:08 PM 07/23/2020  ?  4:01 PM 04/29/2020  ?  2:36 PM  ?BP/Weight  ?Systolic BP 680 321 224 825 138 134 114  ?Diastolic BP 80 80 79 81 82 80 72  ?Wt. (Lbs) 208  206 209 208.4 199 206  ?BMI 39.3 kg/m2  38.92 kg/m2 39.49 kg/m2 39.38 kg/m2 37.6 kg/m2 38.92 kg/m2  ? ? ? ? ? ?Urinary incontinence ?Kiegel's exercises and trial of vesicare ? ?Neck pain on left side ?Uncontrolled.Toradol and depo medrol administered IM in the office , to be followed by a short course of oral prednisone. ? ? ?Insomnia ?Sleep hygiene reviewed and written information offered also. ?Prescription sent for trazodone ? ?

## 2021-05-14 NOTE — Patient Instructions (Addendum)
F/U in 3 months, call if you need me sooner ? ?New for sleep is trazodone at bedtime ? ?New for incontinence is vesicare one daily ? ?5 day course of prednisone is prescribed for left neck pain and spasm, also toradol 60 mg IM and depo medrol 80 mg IM are administered in the office ? ?You are referred to Neurology re new and worsning numbness of your left thigh ? ?Thanks for choosing [facility], we consider it a privelige to serve you. ? ? ? ?

## 2021-05-16 DIAGNOSIS — M542 Cervicalgia: Secondary | ICD-10-CM | POA: Insufficient documentation

## 2021-05-16 NOTE — Assessment & Plan Note (Signed)
Uncontrolled.Toradol and depo medrol administered IM in the office , to be followed by a short course of oral prednisone   

## 2021-05-16 NOTE — Assessment & Plan Note (Signed)
Controlled, no change in medication ?DASH diet and commitment to daily physical activity for a minimum of 30 minutes discussed and encouraged, as a part of hypertension management. ?The importance of attaining a healthy weight is also discussed. ? ? ?  05/14/2021  ?  2:45 PM 01/19/2021  ?  5:14 PM 01/19/2021  ?  4:22 PM 11/25/2020  ?  3:30 PM 10/29/2020  ?  4:08 PM 07/23/2020  ?  4:01 PM 04/29/2020  ?  2:36 PM  ?BP/Weight  ?Systolic BP 953 967 289 791 138 134 114  ?Diastolic BP 80 80 79 81 82 80 72  ?Wt. (Lbs) 208  206 209 208.4 199 206  ?BMI 39.3 kg/m2  38.92 kg/m2 39.49 kg/m2 39.38 kg/m2 37.6 kg/m2 38.92 kg/m2  ? ? ? ? ?

## 2021-05-16 NOTE — Assessment & Plan Note (Signed)
Sleep hygiene reviewed and written information offered also. ?Prescription sent for trazodone ?

## 2021-05-16 NOTE — Assessment & Plan Note (Signed)
Score of 20 in may, 2023, no response to max dose fluoxetine, has now agreed to be treated by Psych , which is good and necesary ?

## 2021-05-16 NOTE — Assessment & Plan Note (Signed)
Improved on medication continue buspar ?

## 2021-05-16 NOTE — Assessment & Plan Note (Signed)
Kiegel's exercises and trial of vesicare ?

## 2021-05-19 ENCOUNTER — Encounter: Payer: Self-pay | Admitting: Family Medicine

## 2021-05-24 ENCOUNTER — Ambulatory Visit (INDEPENDENT_AMBULATORY_CARE_PROVIDER_SITE_OTHER): Payer: BC Managed Care – PPO

## 2021-05-24 ENCOUNTER — Ambulatory Visit (INDEPENDENT_AMBULATORY_CARE_PROVIDER_SITE_OTHER): Payer: BC Managed Care – PPO | Admitting: Podiatry

## 2021-05-24 DIAGNOSIS — M7752 Other enthesopathy of left foot: Secondary | ICD-10-CM | POA: Diagnosis not present

## 2021-05-24 DIAGNOSIS — M7751 Other enthesopathy of right foot: Secondary | ICD-10-CM

## 2021-05-24 NOTE — Progress Notes (Signed)
HPI: 51 y.o. female presenting today for follow-up evaluation of pain and tenderness to the bilateral great toe joints.  Patient was last seen here in the office 07/25/2019.  At that time steroid injection was administered to the great toe joints.  Patient states that she has had pain and tenderness to the bilateral great toe joints for several years.  She says that she has a significant bump over the toe joint and it is very symptomatic despite different shoe gear.  Pain with ambulation.  She has had injections which only helped temporarily.  She presents for further treatment and evaluation  Past Medical History:  Diagnosis Date   Arthritis    Asthma    Phreesia 07/31/2019   Breast discharge    left spont white discharge x 6 months   Carpal tunnel syndrome    left, wears brace at night   Chronic back pain    GERD (gastroesophageal reflux disease)    Hyperlipidemia    Hypertension    Obesity     Past Surgical History:  Procedure Laterality Date   ABDOMINAL HYSTERECTOMY  2003   partial   BREAST BIOPSY Left 2018   benign   CARPAL TUNNEL RELEASE Right    CESAREAN SECTION  1988   CESAREAN SECTION N/A    Phreesia 07/31/2019   CHOLECYSTECTOMY     COLONOSCOPY N/A 09/21/2012   Procedure: COLONOSCOPY;  Surgeon: Danie Binder, MD;  Location: [facility];  Service: Endoscopy;  Laterality: N/A;  9:30   LAPAROSCOPIC GASTRIC SLEEVE RESECTION WITH HIATAL HERNIA REPAIR N/A 06/23/2014   Procedure: LAPAROSCOPIC LYSIS OF ADHESIONS, GASTRIC SLEEVE RESECTION AND UPPER ENDO;  Surgeon: Alphonsa Overall, MD;  Location: [facility];  Service: General;  Laterality: N/A;   LAPAROSCOPIC LYSIS OF ADHESIONS  06/23/2014   Procedure: LAPAROSCOPIC LYSIS OF ADHESIONS;  Surgeon: Alphonsa Overall, MD;  Location: [facility];  Service: General;;   LUMBAR FUSION  01/22/2014   L4 L5       DR Arnoldo Morale   other     Vocal chord polyp removal   ovarian tumor  2005   ovaries removed    SPINE SURGERY N/A    Phreesia 07/31/2019    TONSILLECTOMY     UPPER GI ENDOSCOPY  06/23/2014   Procedure: UPPER GI ENDOSCOPY;  Surgeon: Alphonsa Overall, MD;  Location: [facility];  Service: General;;    Allergies  Allergen Reactions   Codeine Itching   Morphine Itching       Physical Exam: General: The patient is alert and oriented x3 in no acute distress.  Dermatology: Skin is warm, dry and supple bilateral lower extremities. Negative for open lesions or macerations.  Vascular: Palpable pedal pulses bilaterally. Capillary refill within normal limits.  Negative for any significant edema or erythema  Neurological: Light touch and protective threshold grossly intact  Musculoskeletal Exam: No pedal deformities noted.  Symptomatic palpable bump noted to the dorsal medial aspect of the bilateral great toe joints  Radiographic Exam:  Dorsal spurring noted to the bilateral great toe joints left greater than right.  Joint space is mostly preserved to the first MTP bilateral.  There is no increased intermetatarsal angle or hallux abductus angle.  Toes are otherwise in a rectus position  Assessment: 1.  Dorsal spurring bilateral great toe joints/hallux limitus 2.  Metatarsophalangeal capsulitis bilateral great toes   Plan of Care:  1. Patient evaluated. X-Rays reviewed.  2. Today we discussed the conservative versus surgical management of the presenting  pathology. The patient opts for surgical management. All possible complications and details of the procedure were explained. All patient questions were answered. No guarantees were expressed or implied.  Unfortunately the patient has tried multiple conservative treatment modalities without any lasting alleviation or correction of symptoms.  She understands that the spurs to the dorsum of the great toes are permanent and less surgical intervention is warranted.  She would like to proceed with surgery to have the spurs removed 3. Authorization for surgery was initiated today. Surgery will  consist of cheilectomy bilateral great toes 4.  Patient states that throughout the summer she is in a position where she can work from home. 5.  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 Edrick Kins, DPM    [address], [place] [address]                Office [phone]  Fax [phone]

## 2021-06-02 DIAGNOSIS — M79676 Pain in unspecified toe(s): Secondary | ICD-10-CM

## 2021-06-08 ENCOUNTER — Telehealth: Payer: Self-pay | Admitting: Urology

## 2021-06-08 ENCOUNTER — Other Ambulatory Visit: Payer: Self-pay

## 2021-06-08 DIAGNOSIS — M62830 Muscle spasm of back: Secondary | ICD-10-CM

## 2021-06-08 MED ORDER — POTASSIUM CHLORIDE ER 10 MEQ PO TBCR: 10.0000 meq | EXTENDED_RELEASE_TABLET | Freq: Every day | ORAL | 1 refills | Status: DC

## 2021-06-08 MED ORDER — ERGOCALCIFEROL 1.25 MG (50000 UT) PO CAPS: 50000.0000 [IU] | ORAL_CAPSULE | ORAL | 3 refills | Status: DC

## 2021-06-08 MED ORDER — TRAZODONE HCL 50 MG PO TABS: 50.0000 mg | ORAL_TABLET | Freq: Every day | ORAL | 1 refills | Status: DC

## 2021-06-08 MED ORDER — TRIAMTERENE-HCTZ 75-50 MG PO TABS: 1.0000 | ORAL_TABLET | Freq: Every day | ORAL | 1 refills | Status: DC

## 2021-06-08 MED ORDER — ESOMEPRAZOLE MAGNESIUM 40 MG PO CPDR: 40.0000 mg | DELAYED_RELEASE_CAPSULE | Freq: Every day | ORAL | 1 refills | Status: DC

## 2021-06-08 MED ORDER — CYCLOBENZAPRINE HCL 10 MG PO TABS: 10.0000 mg | ORAL_TABLET | Freq: Every day | ORAL | 1 refills | Status: DC

## 2021-06-08 MED ORDER — SOLIFENACIN SUCCINATE 5 MG PO TABS: 5.0000 mg | ORAL_TABLET | Freq: Every day | ORAL | 1 refills | Status: DC

## 2021-06-08 NOTE — Telephone Encounter (Signed)
DOS - 06/24/21  CHEILECTOMY BILAT --- [id number]  [employer] EFFECTIVE DATE - 08/30/20  PLAN DEDUCTIBLE - $200.00 W/ $200.00 REMAINING OUT OF POCKET - $2,500.00 W/ $2,500.00 REMAINING COINSURANCE - 20% COPAY - $0.00  SPOKE WITH ISABEL WITH [employer] AND SHE STATED THAT FOR CPT CODE 39672 NO PRIOR AUTH IS REQUIRED.  REF # ISABEL V. 06/08/21  BCBS STATE EFFECTIVE DATE - 01/03/21  PLAN DEDUCTIBLE - $1,500.00 W/ $1,500.00 REMAINING OUT OF POCKET - $5,900.00 W/ $8,979.15 REMAINING COINSURANCE - 30% COPAY - $0.00    NO PRIOR AUTH IS REQUIRED

## 2021-06-09 NOTE — Telephone Encounter (Signed)
States she knows of people taking mounjaro that isn't diabetic. Or wants to know if any of the other drugs for weight los is an option for her

## 2021-06-10 ENCOUNTER — Ambulatory Visit (HOSPITAL_COMMUNITY): Payer: Self-pay | Admitting: Psychiatry

## 2021-06-10 ENCOUNTER — Other Ambulatory Visit: Payer: Self-pay | Admitting: Family Medicine

## 2021-06-10 MED ORDER — SEMAGLUTIDE-WEIGHT MANAGEMENT 0.25 MG/0.5ML ~~LOC~~ SOAJ: 0.5000 mg | SUBCUTANEOUS | 1 refills | Status: DC

## 2021-06-10 MED ORDER — SEMAGLUTIDE-WEIGHT MANAGEMENT 0.25 MG/0.5ML ~~LOC~~ SOAJ: 0.2500 mg | SUBCUTANEOUS | 0 refills | Status: DC

## 2021-06-18 ENCOUNTER — Encounter: Payer: Self-pay | Admitting: Family Medicine

## 2021-06-22 NOTE — Telephone Encounter (Signed)
Referral entered  

## 2021-06-24 ENCOUNTER — Ambulatory Visit: Payer: BC Managed Care – PPO | Admitting: Neurology

## 2021-06-30 ENCOUNTER — Encounter: Payer: BC Managed Care – PPO | Admitting: Podiatry

## 2021-07-04 ENCOUNTER — Encounter: Payer: Self-pay | Admitting: Family Medicine

## 2021-07-07 ENCOUNTER — Encounter: Payer: BC Managed Care – PPO | Admitting: Podiatry

## 2021-07-08 ENCOUNTER — Other Ambulatory Visit: Payer: Self-pay | Admitting: Podiatry

## 2021-07-08 DIAGNOSIS — M7752 Other enthesopathy of left foot: Secondary | ICD-10-CM | POA: Diagnosis not present

## 2021-07-08 DIAGNOSIS — M7751 Other enthesopathy of right foot: Secondary | ICD-10-CM | POA: Diagnosis not present

## 2021-07-08 MED ORDER — IBUPROFEN 800 MG PO TABS: 800.0000 mg | ORAL_TABLET | Freq: Three times a day (TID) | ORAL | 0 refills | Status: DC | PRN

## 2021-07-08 MED ORDER — OXYCODONE-ACETAMINOPHEN 5-325 MG PO TABS: 1.0000 | ORAL_TABLET | ORAL | 0 refills | Status: DC | PRN

## 2021-07-08 NOTE — Progress Notes (Signed)
PRN postop 

## 2021-07-09 ENCOUNTER — Encounter: Payer: Self-pay | Admitting: Podiatry

## 2021-07-09 ENCOUNTER — Telehealth: Payer: Self-pay

## 2021-07-10 ENCOUNTER — Other Ambulatory Visit: Payer: Self-pay | Admitting: Podiatry

## 2021-07-10 MED ORDER — HYDROCODONE-ACETAMINOPHEN 10-325 MG PO TABS: 1.0000 | ORAL_TABLET | ORAL | 0 refills | Status: AC | PRN

## 2021-07-10 NOTE — Telephone Encounter (Signed)
Spoke with patient. Discontinued the percocet. Rx Vicodin 10/'325mg'$  q4h #30 PRN pain. - Dr. Amalia Hailey

## 2021-07-14 ENCOUNTER — Ambulatory Visit (INDEPENDENT_AMBULATORY_CARE_PROVIDER_SITE_OTHER): Payer: BC Managed Care – PPO

## 2021-07-14 ENCOUNTER — Ambulatory Visit (INDEPENDENT_AMBULATORY_CARE_PROVIDER_SITE_OTHER): Payer: BC Managed Care – PPO | Admitting: Podiatry

## 2021-07-14 DIAGNOSIS — Z09 Encounter for follow-up examination after completed treatment for conditions other than malignant neoplasm: Secondary | ICD-10-CM | POA: Diagnosis not present

## 2021-07-21 ENCOUNTER — Ambulatory Visit (INDEPENDENT_AMBULATORY_CARE_PROVIDER_SITE_OTHER): Payer: BC Managed Care – PPO | Admitting: Podiatry

## 2021-07-21 ENCOUNTER — Encounter: Payer: BC Managed Care – PPO | Admitting: Podiatry

## 2021-07-21 ENCOUNTER — Encounter: Payer: Self-pay | Admitting: Podiatry

## 2021-07-21 DIAGNOSIS — Z09 Encounter for follow-up examination after completed treatment for conditions other than malignant neoplasm: Secondary | ICD-10-CM

## 2021-07-21 NOTE — Progress Notes (Signed)
   Chief Complaint  Patient presents with   Post-op Follow-up    Post op     Subjective:  Patient presents today status post cheilectomy bilateral great toes. DOS: 07/08/2021.  Patient states that she is feeling well.  The pain has improved significantly since last week.  She is continuing to be weightbearing as tolerated in the postsurgical shoes.  No new complaints at this time  Past Medical History:  Diagnosis Date   Arthritis    Asthma    Phreesia 07/31/2019   Breast discharge    left spont white discharge x 6 months   Carpal tunnel syndrome    left, wears brace at night   Chronic back pain    GERD (gastroesophageal reflux disease)    Hyperlipidemia    Hypertension    Obesity       Objective/Physical Exam Neurovascular status intact.  Skin incisions appear to be well coapted with sutures intact. No sign of infectious process noted. No dehiscence. No active bleeding noted. Moderate edema noted to the surgical areas   Assessment: 1. s/p cheilectomy bilateral great toes. DOS: 07/08/2021   Plan of Care:  1. Patient was evaluated.  2.  Sutures removed 3.  Compression ankle sleeves dispensed.  Wear daily 4.  Patient may transition out of the postsurgical shoes into good supportive sneakers 5.  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 Edrick Kins, DPM    [address], [place] [address]                Office [phone]  Fax [phone]

## 2021-07-23 ENCOUNTER — Encounter: Payer: Self-pay | Admitting: Podiatry

## 2021-07-23 NOTE — Progress Notes (Signed)
   Chief Complaint  Patient presents with   Routine Post Op    POV #1 DOS 07/08/2021 CHEILECTOMY B/L GREAT TOES    Subjective:  Patient presents today status post cheilectomy of the bilateral great toe joints. DOS: 07/08/2021.  Patient states that she is doing well.  She does have some intermittent throbbing.  She is taking the pain medication and minimally weightbearing in the postsurgical shoe as instructed.  No new complaints at this time  Past Medical History:  Diagnosis Date   Arthritis    Asthma    Phreesia 07/31/2019   Breast discharge    left spont white discharge x 6 months   Carpal tunnel syndrome    left, wears brace at night   Chronic back pain    GERD (gastroesophageal reflux disease)    Hyperlipidemia    Hypertension    Obesity       Objective/Physical Exam Neurovascular status intact.  Skin incisions appear to be well coapted with sutures intact. No sign of infectious process noted. No dehiscence. No active bleeding noted. Moderate edema noted to the surgical extremity.  Well-healing surgical foot  Radiographic Exam B/L feet:  Osteotomies around the hypertrophic portion of the first metatarsal is noted bilateral great toes.  No acute fractures identified.  No radiolucencies or gas within the tissues.  Well-healing surgical foot based on radiographic exam  Assessment: 1. s/p cheilectomy B/L. DOS: 07/08/2021   Plan of Care:  1. Patient was evaluated. X-rays reviewed 2.  Continue weightbearing in the postsurgical shoes 3.  Patient may begin washing and getting the foot wet 4.  Return to clinic 1 week for suture removal   Edrick Kins, DPM [facility]  Dr. Edrick Kins, DPM    [address], [place] [address]                Office [phone]  Fax [phone]

## 2021-07-28 ENCOUNTER — Other Ambulatory Visit: Payer: Self-pay | Admitting: Family Medicine

## 2021-08-03 ENCOUNTER — Encounter: Payer: Self-pay | Admitting: Family Medicine

## 2021-08-04 ENCOUNTER — Encounter: Payer: BC Managed Care – PPO | Admitting: Podiatry

## 2021-08-04 ENCOUNTER — Other Ambulatory Visit: Payer: Self-pay | Admitting: Internal Medicine

## 2021-08-04 DIAGNOSIS — F419 Anxiety disorder, unspecified: Secondary | ICD-10-CM

## 2021-08-04 MED ORDER — ALPRAZOLAM 1 MG PO TABS: 1.0000 mg | ORAL_TABLET | Freq: Every day | ORAL | 0 refills | Status: DC

## 2021-08-11 ENCOUNTER — Ambulatory Visit (HOSPITAL_COMMUNITY)
Admission: RE | Admit: 2021-08-11 | Discharge: 2021-08-11 | Disposition: A | Discharge status: Home / Self Care | Payer: BC Managed Care – PPO | Source: Ambulatory Visit | Attending: Family Medicine | Admitting: Family Medicine

## 2021-08-11 ENCOUNTER — Encounter: Payer: Self-pay | Admitting: Family Medicine

## 2021-08-11 ENCOUNTER — Encounter: Payer: Self-pay | Admitting: Podiatry

## 2021-08-11 ENCOUNTER — Ambulatory Visit: Payer: BC Managed Care – PPO | Admitting: Family Medicine

## 2021-08-11 ENCOUNTER — Ambulatory Visit (INDEPENDENT_AMBULATORY_CARE_PROVIDER_SITE_OTHER): Payer: BC Managed Care – PPO

## 2021-08-11 ENCOUNTER — Ambulatory Visit (INDEPENDENT_AMBULATORY_CARE_PROVIDER_SITE_OTHER): Payer: BC Managed Care – PPO | Admitting: Family Medicine

## 2021-08-11 ENCOUNTER — Ambulatory Visit (INDEPENDENT_AMBULATORY_CARE_PROVIDER_SITE_OTHER): Payer: BC Managed Care – PPO | Admitting: Podiatry

## 2021-08-11 VITALS — BP 127/81 | HR 91 | Resp 16 | Ht 61.0 in | Wt 213.0 lb

## 2021-08-11 DIAGNOSIS — F419 Anxiety disorder, unspecified: Secondary | ICD-10-CM

## 2021-08-11 DIAGNOSIS — M542 Cervicalgia: Secondary | ICD-10-CM | POA: Insufficient documentation

## 2021-08-11 DIAGNOSIS — F32A Depression, unspecified: Secondary | ICD-10-CM

## 2021-08-11 DIAGNOSIS — G4709 Other insomnia: Secondary | ICD-10-CM

## 2021-08-11 DIAGNOSIS — I10 Essential (primary) hypertension: Secondary | ICD-10-CM | POA: Diagnosis not present

## 2021-08-11 DIAGNOSIS — Z9889 Other specified postprocedural states: Secondary | ICD-10-CM

## 2021-08-11 DIAGNOSIS — Z1231 Encounter for screening mammogram for malignant neoplasm of breast: Secondary | ICD-10-CM | POA: Diagnosis not present

## 2021-08-11 DIAGNOSIS — E785 Hyperlipidemia, unspecified: Secondary | ICD-10-CM

## 2021-08-11 DIAGNOSIS — M5410 Radiculopathy, site unspecified: Secondary | ICD-10-CM

## 2021-08-11 MED ORDER — TRAZODONE HCL 100 MG PO TABS: 100.0000 mg | ORAL_TABLET | Freq: Every day | ORAL | 0 refills | Status: DC

## 2021-08-11 NOTE — Patient Instructions (Addendum)
F/U end September or early October, call if you need me sooner, RE EVALUATE WEIGHT   Please get X ray of your neck today   Increased dose of trazodone  TO 100 MG DAILY  PLEASE KEEP ALL APPTS SCHEDULED  MAMMOGRAM AT BREAST CENTER TO BE SCHEDULED AT CHECKOUT  FASTING LIPID , CMP AND egfr 3 DAYS BEFORE NEXT APPOINTMENT  THANKFUL FOOT SURGERY WENT WELL  Thanks for choosing [facility], we consider it a privelige to serve you.

## 2021-08-11 NOTE — Progress Notes (Signed)
   Chief Complaint  Patient presents with   Post-op Follow-up    POV #3 DOS 07/08/2021 CHEILECTOMY B/L GREAT TOES    Subjective:  Patient presents today status post cheilectomy bilateral great toes. DOS: 07/08/2021.  Patient continues to do well.  She does state that she has some mild swelling to the surgical areas.  Overall she is doing well and wearing shoes that do not irritate the feet  Past Medical History:  Diagnosis Date   Arthritis    Asthma    Phreesia 07/31/2019   Breast discharge    left spont white discharge x 6 months   Carpal tunnel syndrome    left, wears brace at night   Chronic back pain    GERD (gastroesophageal reflux disease)    Hyperlipidemia    Hypertension    Obesity       Objective/Physical Exam Neurovascular status intact.  Skin incisions healed.  Good range of motion to the bilateral great toe joints.  There is some mild localized edema around the surgical area  Radiographic exam B/L feet 08/11/2021 Osteotomy/cheilectomy sites appear clean.  Well-healed surgical sites.  No hardware noted.  Joint spaces preserved.   Assessment: 1. s/p cheilectomy bilateral great toes. DOS: 07/08/2021   Plan of Care:  1. Patient was evaluated.  2.  Overall the patient is doing very well.  Recommend wide fitting shoes that do not irritate the surgical sites 3.  Recommend Mederma scar cream or gel as needed 4.  Patient may return to work full activity no restrictions beginning 08/18/2021  5.  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 Edrick Kins, DPM    [address], [place] [address]                Office [phone]  Fax [phone]

## 2021-08-15 ENCOUNTER — Encounter: Payer: Self-pay | Admitting: Family Medicine

## 2021-08-15 ENCOUNTER — Ambulatory Visit
Admission: EM | Admit: 2021-08-15 | Discharge: 2021-08-15 | Disposition: A | Discharge status: Home / Self Care | Payer: BC Managed Care – PPO | Attending: Nurse Practitioner | Admitting: Nurse Practitioner

## 2021-08-15 ENCOUNTER — Encounter: Payer: Self-pay | Admitting: Emergency Medicine

## 2021-08-15 DIAGNOSIS — J069 Acute upper respiratory infection, unspecified: Secondary | ICD-10-CM | POA: Insufficient documentation

## 2021-08-15 DIAGNOSIS — U071 COVID-19: Secondary | ICD-10-CM | POA: Diagnosis not present

## 2021-08-15 LAB — SARS CORONAVIRUS 2 (TAT 6-24 HRS): SARS Coronavirus 2: POSITIVE — AB

## 2021-08-15 MED ORDER — PROMETHAZINE-DM 6.25-15 MG/5ML PO SYRP: 5.0000 mL | ORAL_SOLUTION | Freq: Every evening | ORAL | 0 refills | Status: DC | PRN

## 2021-08-15 MED ORDER — BENZONATATE 100 MG PO CAPS: 100.0000 mg | ORAL_CAPSULE | Freq: Three times a day (TID) | ORAL | 0 refills | Status: DC | PRN

## 2021-08-15 NOTE — ED Triage Notes (Signed)
Patient c/o head congestion, runny nose, headache, cough, denies fever.  Patient has taken some Alka Seltzer and Tylenol.

## 2021-08-15 NOTE — Progress Notes (Signed)
Stephanie Cannon     MRN: [id number]      DOB: 20-May-1970   HPI Stephanie Cannon is here for follow up and re-evaluation of chronic medical conditions, medication management and review of any available recent lab and radiology data.  Preventive health is updated, specifically  Cancer screening and Immunization.   Successful bilateral foot surgery, returns to work next week after 6 weeks out.  The PT denies any adverse reactions to current medications since the last visit.  C/o increased back pain , neurosurgeon to eval next week, has had back surgery  C/o posterior neck pain extending to back of head, uncontrolled , no new upper ext weakness or numbness, no vision disturbance  C/o left thigh numbness from low back down to knee  ROS Denies recent fever or chills. Denies sinus pressure, nasal congestion, ear pain or sore throat. Denies chest congestion, productive cough or wheezing. Denies chest pains, palpitations and leg swelling Denies abdominal pain, nausea, vomiting,diarrhea or constipation.   Denies dysuria, frequency, hesitancy or incontinence.  Chronic  depression, anxiety poor sleep some improvement on medication recently started, has psych appt upcoming. Denies skin break down or rash.   PE  BP 127/81   Pulse 91   Resp 16   Ht '5\' 1"'$  (1.549 m)   Wt 213 lb (96.6 kg)   BMI 40.25 kg/m   Patient alert and oriented and in no cardiopulmonary distress.  HEENT: No facial asymmetry, EOMI,     Neck decreased ROM with trapezius spasm.  Chest: Clear to auscultation bilaterally.  CVS: S1, S2 no murmurs, no S3.Regular rate.  ABD: Soft non tender.   Ext: No edema  MS: decreased  ROM spine,adequate in  shoulders, hips and knees.  Skin: Intact, no ulcerations or rash noted.  Psych: Good eye contact, normal affect. Memory intact not anxious or depressed appearing.  CNS: CN 2-12 intact, power,  normal throughout.no focal deficits noted.   Assessment &  Plan  Insomnia Inadequately treated, increase dose of trazodone Sleep hygiene reviewed and written information offered also. Prescription sent for  medication needed. '  Anxiety and depression Psychiatry to treat, not suicidal or homicidal  Essential hypertension Controlled, no change in medication DASH diet and commitment to daily physical activity for a minimum of 30 minutes discussed and encouraged, as a part of hypertension management. The importance of attaining a healthy weight is also discussed.     08/15/2021   11:15 AM 08/15/2021   11:13 AM 08/11/2021    3:26 PM 05/14/2021    2:45 PM 01/19/2021    5:14 PM 01/19/2021    4:22 PM 11/25/2020    3:30 PM  BP/Weight  Systolic BP  818 299 371 696 789 381  Diastolic BP  89 81 80 80 79 81  Wt. (Lbs) 212  213 208  206 209  BMI 40.06 kg/m2  40.25 kg/m2 39.3 kg/m2  38.92 kg/m2 39.49 kg/m2       Neck pain Increased and radiating to nape causing posterior headache, likely severe arthritis and disc disease, start with x ray c spine  Morbid obesity ([place])  Patient re-educated about  the importance of commitment to a  minimum of 150 minutes of exercise per week as able.  The importance of healthy food choices with portion control discussed, as well as eating regularly and within a 12 hour window most days. The need to choose "clean , green" food 50 to 75% of the time is discussed, as well  as to make water the primary drink and set a goal of 64 ounces water daily.       08/15/2021   11:15 AM 08/11/2021    3:26 PM 05/14/2021    2:45 PM  Weight /BMI  Weight 212 lb 213 lb 208 lb  Height '5\' 1"'$  (1.549 m) '5\' 1"'$  (1.549 m) '5\' 1"'$  (1.549 m)  BMI 40.06 kg/m2 40.25 kg/m2 39.3 kg/m2      Back pain with left-sided radiculopathy Worsening, neurosurgery to re evaluate

## 2021-08-15 NOTE — Assessment & Plan Note (Signed)
  Patient re-educated about  the importance of commitment to a  minimum of 150 minutes of exercise per week as able.  The importance of healthy food choices with portion control discussed, as well as eating regularly and within a 12 hour window most days. The need to choose "clean , green" food 50 to 75% of the time is discussed, as well as to make water the primary drink and set a goal of 64 ounces water daily.       08/15/2021   11:15 AM 08/11/2021    3:26 PM 05/14/2021    2:45 PM  Weight /BMI  Weight 212 lb 213 lb 208 lb  Height '5\' 1"'$  (1.549 m) '5\' 1"'$  (1.549 m) '5\' 1"'$  (1.549 m)  BMI 40.06 kg/m2 40.25 kg/m2 39.3 kg/m2

## 2021-08-15 NOTE — Discharge Instructions (Addendum)
Your symptoms and exam findings are most consistent with a viral upper respiratory infection. These usually run their course in about 10 days.  If your symptoms last longer than 10 days without improvement, please follow up with your primary care provider.  If your symptoms, worsen, please go to the Emergency Room.    We have tested you today for COVID-19.  You will see the results in Mychart and we will call you with positive results.    Please stay home and isolate until you are aware of the results.    Some things that can make you feel better are: - Increased rest - Increasing fluid with water/sugar free electrolytes - Acetaminophen and ibuprofen as needed for fever/pain.  - Salt water gargling, chloraseptic spray and throat lozenges - OTC guaifenesin (Mucinex).  - Saline sinus flushes or a neti pot.  - Humidifying the air. - Cough suppressants as needed for dry cough.

## 2021-08-15 NOTE — Assessment & Plan Note (Signed)
Increased and radiating to nape causing posterior headache, likely severe arthritis and disc disease, start with x ray c spine

## 2021-08-15 NOTE — Assessment & Plan Note (Signed)
Inadequately treated, increase dose of trazodone Sleep hygiene reviewed and written information offered also. Prescription sent for  medication needed. '

## 2021-08-15 NOTE — ED Provider Notes (Signed)
[facility]    CSN: [id number] Arrival date & time: 08/15/21  1026      History   Chief Complaint Chief Complaint  Patient presents with   Head Congestion    HPI KIMIYO CARMICHEAL is a 51 y.o. female.   Patient presents with 1 day of chills, congested cough, wheezing at nighttime, nasal congestion, postnasal drainage, sinus pressure, headache, ear pressure, decreased appetite, and fatigue.  She denies chest pain, shortness of breath, chest tightness, sneezing, sore throat, ear drainage, eye redness or itching, abdominal pain, nausea/vomiting, diarrhea.  She has taken Alka-Seltzer and other over-the-counter medications without much relief.  Reports her husband tested positive for COVID-19 last week.  Medical history significant for hypertension, GERD, anxiety, morbid obesity, prediabetes.    Past Medical History:  Diagnosis Date   Arthritis    Asthma    Phreesia 07/31/2019   Breast discharge    left spont white discharge x 6 months   Carpal tunnel syndrome    left, wears brace at night   Chronic back pain    GERD (gastroesophageal reflux disease)    Hyperlipidemia    Hypertension    Obesity     Patient Active Problem List   Diagnosis Date Noted   Neck pain on left side 05/16/2021   Low back pain with left-sided sciatica 11/25/2020   Depression, major, single episode, severe ([place]) 11/02/2020   Shoulder pain, left 05/01/2020   Vitamin D deficiency 05/01/2020   GAD (generalized anxiety disorder) 02/08/2020   Anxiety and depression 02/08/2020   Back pain with left-sided radiculopathy 08/20/2019   Neck pain 06/18/2019   Insomnia 09/19/2018   Superficial inflammatory acne vulgaris 07/14/2018   Urinary incontinence 01/23/2018   Knee pain, left anterior 11/01/2017   Dysphonia 07/05/2016   Vocal fold polyp 07/05/2016   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   Spondylolisthesis of lumbar region 01/22/2014   Accessory skin tags 04/03/2013   Prediabetes  96/28/3662   Metabolic syndrome X 94/76/5465   Hot flashes due to surgical menopause 12/06/2011   Morbid obesity ([place]) 04/20/2011   Anxiety 03/01/2011   Hyperlipidemia LDL goal <100 07/08/2010   HEMORRHOIDS 04/27/2009   CARPAL TUNNEL SYNDROME 06/26/2007   Essential hypertension 06/26/2007   GERD (gastroesophageal reflux disease) 06/26/2007   Spasm of muscle of lower back 06/26/2007    Past Surgical History:  Procedure Laterality Date   ABDOMINAL HYSTERECTOMY  2003   partial   BREAST BIOPSY Left 2018   benign   CARPAL TUNNEL RELEASE Right    CESAREAN SECTION  1988   CESAREAN SECTION N/A    Phreesia 07/31/2019   CHOLECYSTECTOMY     COLONOSCOPY N/A 09/21/2012   Procedure: COLONOSCOPY;  Surgeon: Danie Binder, MD;  Location: [facility];  Service: Endoscopy;  Laterality: N/A;  9:30   LAPAROSCOPIC GASTRIC SLEEVE RESECTION WITH HIATAL HERNIA REPAIR N/A 06/23/2014   Procedure: LAPAROSCOPIC LYSIS OF ADHESIONS, GASTRIC SLEEVE RESECTION AND UPPER ENDO;  Surgeon: Alphonsa Overall, MD;  Location: [facility];  Service: General;  Laterality: N/A;   LAPAROSCOPIC LYSIS OF ADHESIONS  06/23/2014   Procedure: LAPAROSCOPIC LYSIS OF ADHESIONS;  Surgeon: Alphonsa Overall, MD;  Location: [facility];  Service: General;;   LUMBAR FUSION  01/22/2014   L4 L5       DR Arnoldo Morale   other     Vocal chord polyp removal   ovarian tumor  2005   ovaries removed    SPINE SURGERY N/A  Phreesia 07/31/2019   TONSILLECTOMY     UPPER GI ENDOSCOPY  06/23/2014   Procedure: UPPER GI ENDOSCOPY;  Surgeon: Alphonsa Overall, MD;  Location: [facility];  Service: General;;    OB History   No obstetric history on file.      Home Medications    Prior to Admission medications   Medication Sig Start Date End Date Taking? Authorizing Provider  ALPRAZolam Duanne Moron) 1 MG tablet Take 1 tablet (1 mg total) by mouth at bedtime. 08/04/21  Yes Lindell Spar, MD  busPIRone (BUSPAR) 15 MG tablet Take 1 tablet (15 mg total) by mouth 3 (three) times  daily. 01/19/21  Yes Fayrene Helper, MD  cetirizine (ZYRTEC) 10 MG tablet Take 10 mg by mouth daily. 01/06/21  Yes [provider]  cyclobenzaprine (FLEXERIL) 10 MG tablet Take 1 tablet (10 mg total) by mouth at bedtime. 06/08/21  Yes Fayrene Helper, MD  ergocalciferol (VITAMIN D2) 1.25 MG (50000 UT) capsule Take 1 capsule (50,000 Units total) by mouth once a week. One capsule once weekly 06/08/21  Yes Fayrene Helper, MD  esomeprazole (NEXIUM) 40 MG capsule Take 1 capsule (40 mg total) by mouth daily. 06/08/21  Yes Fayrene Helper, MD  Multiple Vitamins-Minerals (MULTIVITAMIN GUMMIES ADULT PO) Take 1 each by mouth 2 (two) times daily.   Yes [provider]  ondansetron (ZOFRAN) 4 MG tablet TAKE (1) TABLET BY MOUTH EVERY EIGHT HOURS AS NEEDED FOR NAUSEA/VOMITING. 03/09/21  Yes Fayrene Helper, MD  potassium chloride (KLOR-CON) 10 MEQ tablet Take 1 tablet (10 mEq total) by mouth daily. 06/08/21  Yes Fayrene Helper, MD  Semaglutide-Weight Management 0.25 MG/0.5ML SOAJ Inject 0.25 mg into the skin once a week. 06/10/21  Yes Fayrene Helper, MD  solifenacin (VESICARE) 5 MG tablet Take 1 tablet (5 mg total) by mouth daily. 06/08/21  Yes Fayrene Helper, MD  traZODone (DESYREL) 100 MG tablet Take 1 tablet (100 mg total) by mouth at bedtime. 08/11/21  Yes Fayrene Helper, MD  triamterene-hydrochlorothiazide (MAXZIDE) 75-50 MG tablet Take 1 tablet by mouth daily. 06/08/21  Yes Fayrene Helper, MD  ibuprofen (ADVIL) 800 MG tablet Take 1 tablet (800 mg total) by mouth every 8 (eight) hours as needed. Patient not taking: Reported on 08/11/2021 07/08/21   Edrick Kins, DPM  Semaglutide-Weight Management 0.25 MG/0.5ML SOAJ Inject 0.5 mg into the skin once a week. Patient not taking: Reported on 08/11/2021 07/08/21   Fayrene Helper, MD    Family History Family History  Problem Relation Age of Onset   Hypertension Mother    Hypertension Father    Hypertension Brother     Cancer Maternal Aunt    Cancer Maternal Uncle        liver   Cancer Paternal Uncle        bone cancer   Diabetes Maternal Grandmother    Colon cancer Neg Hx     Social History Social History   Tobacco Use   Smoking status: Former    Packs/day: 0.75    Years: 17.00    Total pack years: 12.75    Types: Cigarettes    Quit date: 01/03/2009    Years since quitting: 12.6   Smokeless tobacco: Never  Vaping Use   Vaping Use: Never used  Substance Use Topics   Alcohol use: Yes    Comment: occasionally   Drug use: No     Allergies   Codeine and Morphine  Review of Systems Review of Systems Per HPI  Physical Exam Triage Vital Signs ED Triage Vitals  Enc Vitals Group     BP 08/15/21 1113 (!) 141/89     Pulse Rate 08/15/21 1113 (!) 104     Resp 08/15/21 1113 20     Temp 08/15/21 1113 98.3 F (36.8 C)     Temp Source 08/15/21 1113 Oral     SpO2 08/15/21 1113 97 %     Weight 08/15/21 1115 212 lb (96.2 kg)     Height 08/15/21 1115 '5\' 1"'$  (1.549 m)     Head Circumference --      Peak Flow --      Pain Score 08/15/21 1114 0     Pain Loc --      Pain Edu? --      Excl. in [place]? --    No data found.  Updated Vital Signs BP (!) 141/89 (BP Location: Right Arm)   Pulse (!) 104   Temp 98.3 F (36.8 C) (Oral)   Resp 20   Ht '5\' 1"'$  (1.549 m)   Wt 212 lb (96.2 kg)   SpO2 97%   BMI 40.06 kg/m   Visual Acuity Right Eye Distance:   Left Eye Distance:   Bilateral Distance:    Right Eye Near:   Left Eye Near:    Bilateral Near:     Physical Exam Vitals and nursing note reviewed.  Constitutional:      General: She is not in acute distress.    Appearance: She is not toxic-appearing.  HENT:     Head: Normocephalic and atraumatic.     Right Ear: A middle ear effusion is present. Tympanic membrane is not perforated, erythematous or retracted.     Left Ear: A middle ear effusion is present. Tympanic membrane is not perforated, erythematous or retracted.     Nose:  Congestion and rhinorrhea present.     Mouth/Throat:     Mouth: Mucous membranes are moist.     Pharynx: Oropharynx is clear. Posterior oropharyngeal erythema present. No oropharyngeal exudate.  Eyes:     General: No scleral icterus.    Extraocular Movements: Extraocular movements intact.  Cardiovascular:     Rate and Rhythm: Normal rate and regular rhythm.  Pulmonary:     Effort: Pulmonary effort is normal. No respiratory distress.     Breath sounds: Normal breath sounds. No wheezing, rhonchi or rales.  Abdominal:     General: Abdomen is flat. Bowel sounds are normal. There is no distension.     Palpations: Abdomen is soft. There is no mass.     Tenderness: There is no abdominal tenderness. There is no right CVA tenderness, left CVA tenderness or guarding.  Musculoskeletal:     Cervical back: Normal range of motion.  Lymphadenopathy:     Cervical: No cervical adenopathy.  Skin:    General: Skin is warm and dry.     Capillary Refill: Capillary refill takes less than 2 seconds.     Coloration: Skin is not jaundiced or pale.     Findings: No erythema.  Neurological:     Mental Status: She is alert and oriented to person, place, and time.  Psychiatric:        Behavior: Behavior is cooperative.      UC Treatments / Results  Labs (all labs ordered are listed, but only abnormal results are displayed) Labs Reviewed  SARS CORONAVIRUS 2 (TAT 6-24 HRS)  BASIC METABOLIC PANEL  EKG   Radiology No results found.  Procedures Procedures (including critical care time)  Medications Ordered in UC Medications - No data to display  Initial Impression / Assessment and Plan / UC Course  I have reviewed the triage vital signs and the nursing notes.  Pertinent labs & imaging results that were available during my care of the patient were reviewed by me and considered in my medical decision making (see chart for details).    Patient is a very pleasant, well-appearing 51 year old  female presenting for nasal congestion, cough, headache today.  Reassurance provided that exam findings and history are consistent with viral infection.  COVID-19 testing obtained, I am highly suspicious given known exposure and symptoms.  BMET obtained to check kidney function-if normal, she would be a good candidate for Paxlovid if she test positive for COVID-19.  Supportive care discussed.  Start cough suppressant for dry cough only.  Seek care if symptoms last longer than 10 days without improvement.  ER precautions discussed.  Note given for work.  The patient was given the opportunity to ask questions.  All questions answered to their satisfaction.  The patient is in agreement to this plan.   Final Clinical Impressions(s) / UC Diagnoses   Final diagnoses:  Viral URI with cough     Discharge Instructions      Your symptoms and exam findings are most consistent with a viral upper respiratory infection. These usually run their course in about 10 days.  If your symptoms last longer than 10 days without improvement, please follow up with your primary care provider.  If your symptoms, worsen, please go to the Emergency Room.    We have tested you today for COVID-19.  You will see the results in Mychart and we will call you with positive results.    Please stay home and isolate until you are aware of the results.    Some things that can make you feel better are: - Increased rest - Increasing fluid with water/sugar free electrolytes - Acetaminophen and ibuprofen as needed for fever/pain.  - Salt water gargling, chloraseptic spray and throat lozenges - OTC guaifenesin (Mucinex).  - Saline sinus flushes or a neti pot.  - Humidifying the air. - Cough suppressants as needed for dry cough.      ED Prescriptions   None    PDMP not reviewed this encounter.   Eulogio Bear, NP 08/15/21 1147

## 2021-08-15 NOTE — Assessment & Plan Note (Signed)
Psychiatry to treat, not suicidal or homicidal

## 2021-08-15 NOTE — Assessment & Plan Note (Signed)
Controlled, no change in medication DASH diet and commitment to daily physical activity for a minimum of 30 minutes discussed and encouraged, as a part of hypertension management. The importance of attaining a healthy weight is also discussed.     08/15/2021   11:15 AM 08/15/2021   11:13 AM 08/11/2021    3:26 PM 05/14/2021    2:45 PM 01/19/2021    5:14 PM 01/19/2021    4:22 PM 11/25/2020    3:30 PM  BP/Weight  Systolic BP  696 789 381 017 510 258  Diastolic BP  89 81 80 80 79 81  Wt. (Lbs) 212  213 208  206 209  BMI 40.06 kg/m2  40.25 kg/m2 39.3 kg/m2  38.92 kg/m2 39.49 kg/m2

## 2021-08-15 NOTE — Assessment & Plan Note (Signed)
Worsening, neurosurgery to re evaluate

## 2021-08-16 ENCOUNTER — Telehealth (HOSPITAL_COMMUNITY): Payer: Self-pay | Admitting: Emergency Medicine

## 2021-08-16 LAB — BASIC METABOLIC PANEL
BUN/Creatinine Ratio: 9 (ref 9–23)
BUN: 8 mg/dL (ref 6–24)
CO2: 25 mmol/L (ref 20–29)
Calcium: 9.6 mg/dL (ref 8.7–10.2)
Chloride: 97 mmol/L (ref 96–106)
Creatinine, Ser: 0.92 mg/dL (ref 0.57–1.00)
Glucose: 91 mg/dL (ref 70–99)
Potassium: 3.3 mmol/L — ABNORMAL LOW (ref 3.5–5.2)
Sodium: 137 mmol/L (ref 134–144)
eGFR: 75 mL/min/{1.73_m2} (ref >59)

## 2021-08-16 MED ORDER — NIRMATRELVIR/RITONAVIR (PAXLOVID)TABLET: 3.0000 | ORAL_TABLET | Freq: Two times a day (BID) | ORAL | 0 refills | Status: AC

## 2021-08-16 MED ORDER — NIRMATRELVIR/RITONAVIR (PAXLOVID)TABLET: 3.0000 | ORAL_TABLET | Freq: Two times a day (BID) | ORAL | 0 refills | Status: DC

## 2021-08-16 NOTE — Telephone Encounter (Signed)
[facility] is out of Paxlovid, called and confirmed they have it at [place], updated patient

## 2021-08-17 ENCOUNTER — Other Ambulatory Visit: Payer: Self-pay | Admitting: Family Medicine

## 2021-08-17 ENCOUNTER — Other Ambulatory Visit: Payer: Self-pay

## 2021-08-17 ENCOUNTER — Encounter: Payer: Self-pay | Admitting: Family Medicine

## 2021-08-17 ENCOUNTER — Ambulatory Visit: Payer: BC Managed Care – PPO | Admitting: Neurology

## 2021-08-17 ENCOUNTER — Ambulatory Visit: Payer: BC Managed Care – PPO | Admitting: Family Medicine

## 2021-08-17 MED ORDER — LIDOCAINE 5 % EX OINT
1.0000 | TOPICAL_OINTMENT | CUTANEOUS | 0 refills | Status: DC | PRN
Start: 2021-08-17 — End: 2021-10-21

## 2021-08-17 MED ORDER — ACYCLOVIR 800 MG PO TABS: 800.0000 mg | ORAL_TABLET | Freq: Three times a day (TID) | ORAL | 0 refills | Status: DC

## 2021-08-19 ENCOUNTER — Encounter: Payer: Self-pay | Admitting: Family Medicine

## 2021-08-19 ENCOUNTER — Ambulatory Visit: Payer: BC Managed Care – PPO | Admitting: Neurology

## 2021-08-23 ENCOUNTER — Encounter (HOSPITAL_COMMUNITY): Payer: Self-pay | Admitting: Psychiatry

## 2021-08-23 ENCOUNTER — Encounter: Payer: Self-pay | Admitting: Podiatry

## 2021-08-23 ENCOUNTER — Ambulatory Visit (HOSPITAL_BASED_OUTPATIENT_CLINIC_OR_DEPARTMENT_OTHER): Payer: Self-pay | Admitting: Psychiatry

## 2021-08-23 VITALS — Wt 212.0 lb

## 2021-08-23 DIAGNOSIS — F39 Unspecified mood [affective] disorder: Secondary | ICD-10-CM

## 2021-08-23 DIAGNOSIS — F411 Generalized anxiety disorder: Secondary | ICD-10-CM

## 2021-08-23 MED ORDER — LAMOTRIGINE 25 MG PO TABS: 25.0000 mg | ORAL_TABLET | Freq: Every day | ORAL | 2 refills | Status: DC

## 2021-08-23 NOTE — Progress Notes (Signed)
[facility] Initial Assessment Note  Patient Location:In Car Provider Location:Home Office   I connected with Stephanie Cannon by video and verified that I am talking with correct person using two identifiers.   I discussed the limitations, risks, security and privacy concerns of performing an evaluation and management service virtually and the availability of in person appointments. I also discussed with the patient that there may be a patient responsible charge related to this service. The patient expressed understanding and agreed to proceed.  Stephanie Cannon [id number] 51 y.o.  08/23/2021 1:11 PM  Chief Complaint:  My doctor ask me to see psychiatrist.  History of Present Illness:  Patient is 51 year old African-American, married, employed female who is referred from Dr. Moshe Cipro for the management of her psychiatric symptoms.  Patient do not feel that she has issues other than chronic insomnia.  She feels her insomnia is due to chronic pain and she does not sleep very well.  She reported chronic tingling, pain, numbness does not let her sleep and next day she is very tired and has no energy to do things.  Later patient did mention that she has treated in the past for anxiety and depression by Dr. Margurite Auerbach but not consistent with the follow-up.  She also saw a few times therapist but reported that it was because of her son who needed the therapy and she was just joining the session.  Currently she is not in any therapy.  She is taking Xanax, trazodone and recently BuSpar started.  She has not seen any significant improvement in her sleep.  When asked about anxiety and mood symptoms she finally admitted that there are times when she feels and reported irritability, mood swings, and do not like being in the crowd.  She tried to keep herself busy and she has few people at work that she get along.  She is working at [employer] center at [employer] for past 5 years.  Patient  told some time job is challenging and draining but no major problem at work.  She feels stressed, tired from the work but also contribute to these symptoms due to lack of sleep.  Patient denies any hallucination, paranoia, suicidal or homicidal thoughts.  She admitted binge eating and impulsive behavior which she described lately impulsive buying.  She admitted sometime road rage but denies any legal issues.  She also reported excessive cleaning and racing thoughts which she described because she cannot sit at 1 place for a long time because of the pain.  She has pain in her joints, shoulder and back.  Patient has a history of back surgery.  She has a history of gastric sleeve in 2016.  She also noticed some time struggle with attention, concentration.  She lives with her husband who she has been married for 26 years but known to him since elementary school.  They have a 19 year old son who is in [place] and own a Administrator, [employer].  Patient told her parents live close by and they are elderly and sometime she does help them.  She reported that her mother may need to see or talk to someone because of her mood.  Patient denies illegal substance use but admitted drinks alcohol which she described wine or mixed drinks and last drink was 3 weeks ago.  Patient does not like going to be grocery stores and if someone says something to her then she go back in her car.  Patient reported trying to help  her family and involved in church activities are her outlets.  Her current medicines are Xanax, trazodone and BuSpar.  She did not recall having any sleep study or seeing any rheumatologist for chronic pain.   Past Psychiatric History: Reviewed.  Family History  Problem Relation Age of Onset   Hypertension Mother    Hypertension Father    Hypertension Brother    Cancer Maternal Aunt    Cancer Maternal Uncle        liver   Cancer Paternal Uncle        bone cancer   Diabetes Maternal Grandmother    Colon cancer Neg  Hx       Past Medical History:  Diagnosis Date   Arthritis    Asthma    Phreesia 07/31/2019   Breast discharge    left spont white discharge x 6 months   Carpal tunnel syndrome    left, wears brace at night   Chronic back pain    GERD (gastroesophageal reflux disease)    Hyperlipidemia    Hypertension    Obesity      Traumatic Head Injury: Patient denies any history of psychiatric inpatient treatment or any suicidal attempt.  She was seen by Dr. Harrington Challenger twice but never continued the follow-ups.  She saw a therapist but clean it was to help her son.  In the past she had tried Prozac and temazepam.  No history of psychosis.  Work History; Patient did not finish college.  She is working in a Cabin crew at [employer].  Psychosocial History; Patient lives with her husband who she is married for 26 years.  Together they have a 87 year old son who lives in [place].  Patient's elderly parents live close by.  Legal History; Patient denies any legal issues.  Neurologic: Headache: No Seizure: No Paresthesias:  Tingling and numbness.   Outpatient Encounter Medications as of 08/23/2021  Medication Sig   acyclovir (ZOVIRAX) 800 MG tablet Take 1 tablet (800 mg total) by mouth 3 (three) times daily.   ALPRAZolam (XANAX) 1 MG tablet Take 1 tablet (1 mg total) by mouth at bedtime.   benzonatate (TESSALON) 100 MG capsule Take 1 capsule (100 mg total) by mouth 3 (three) times daily as needed for cough. Do not take with alcohol or while driving or operating heavy machinery   busPIRone (BUSPAR) 15 MG tablet Take 1 tablet (15 mg total) by mouth 3 (three) times daily.   cetirizine (ZYRTEC) 10 MG tablet Take 10 mg by mouth daily.   cyclobenzaprine (FLEXERIL) 10 MG tablet Take 1 tablet (10 mg total) by mouth at bedtime.   ergocalciferol (VITAMIN D2) 1.25 MG (50000 UT) capsule Take 1 capsule (50,000 Units total) by mouth once a week. One capsule once weekly   esomeprazole  (NEXIUM) 40 MG capsule Take 1 capsule (40 mg total) by mouth daily.   lidocaine (XYLOCAINE) 5 % ointment Apply 1 Application topically as needed.   Multiple Vitamins-Minerals (MULTIVITAMIN GUMMIES ADULT PO) Take 1 each by mouth 2 (two) times daily.   ondansetron (ZOFRAN) 4 MG tablet TAKE (1) TABLET BY MOUTH EVERY EIGHT HOURS AS NEEDED FOR NAUSEA/VOMITING.   potassium chloride (KLOR-CON) 10 MEQ tablet Take 1 tablet (10 mEq total) by mouth daily.   promethazine-dextromethorphan (PROMETHAZINE-DM) 6.25-15 MG/5ML syrup Take 5 mLs by mouth at bedtime as needed for cough. Do not take with alcohol or while driving or operating heavy machinery   Semaglutide-Weight Management 0.25 MG/0.5ML SOAJ Inject 0.25 mg into the  skin once a week.   Semaglutide-Weight Management 0.25 MG/0.5ML SOAJ Inject 0.5 mg into the skin once a week. (Patient not taking: Reported on 08/11/2021)   solifenacin (VESICARE) 5 MG tablet Take 1 tablet (5 mg total) by mouth daily.   traZODone (DESYREL) 100 MG tablet Take 1 tablet (100 mg total) by mouth at bedtime.   triamterene-hydrochlorothiazide (MAXZIDE) 75-50 MG tablet Take 1 tablet by mouth daily.   No facility-administered encounter medications on file as of 08/23/2021.    Recent Results (from the past 2160 hour(s))  SARS CORONAVIRUS 2 (TAT 6-24 HRS) Anterior Nasal Swab     Status: Abnormal   Collection Time: 08/15/21 11:47 AM   Specimen: Anterior Nasal Swab  Result Value Ref Range   SARS Coronavirus 2 POSITIVE (A) NEGATIVE    Comment: (NOTE) SARS-CoV-2 target nucleic acids are DETECTED.  The SARS-CoV-2 RNA is generally detectable in upper and lower respiratory specimens during the acute phase of infection. Positive results are indicative of the presence of SARS-CoV-2 RNA. Clinical correlation with patient history and other diagnostic information is  necessary to determine patient infection status. Positive results do not rule out bacterial infection or co-infection with  other viruses.  The expected result is Negative.  Fact Sheet for Patients: SugarRoll.be  Fact Sheet for Healthcare Providers: https://www.woods-mathews.com/  This test is not yet approved or cleared by the [place] FDA and  has been authorized for detection and/or diagnosis of SARS-CoV-2 by FDA under an Emergency Use Authorization (EUA). This EUA will remain  in effect (meaning this test can be used) for the duration of the COVID-19 declaration under Section 564(b)(1) of the Act, 21 U. S.C. section 360bbb-3(b)(1), unless the authorization is terminated or revoked sooner.   Performed at [facility], [address], [place], [place] [address]   Basic metabolic panel     Status: Abnormal   Collection Time: 08/15/21 11:47 AM  Result Value Ref Range   Glucose 91 70 - 99 mg/dL   BUN 8 6 - 24 mg/dL   Creatinine, Ser 0.92 0.57 - 1.00 mg/dL   eGFR 75 >59 mL/min/1.73   BUN/Creatinine Ratio 9 9 - 23   Sodium 137 134 - 144 mmol/L   Potassium 3.3 (L) 3.5 - 5.2 mmol/L   Chloride 97 96 - 106 mmol/L   CO2 25 20 - 29 mmol/L   Calcium 9.6 8.7 - 10.2 mg/dL      Constitutional:  There were no vitals taken for this visit.   Musculoskeletal: Strength & Muscle Tone: within normal limits Gait & Station: normal Patient leans: N/A  Psychiatric Specialty Exam: Physical Exam  Review of Systems  Neurological:  Positive for tingling.    Weight 212 lb (96.2 kg).There is no height or weight on file to calculate BMI.  General Appearance: Casual  Eye Contact:  Fair  Speech:  Normal Rate  Volume:  Normal  Mood:  Dysphoric  Affect:  Full Range  Thought Process:  Descriptions of Associations: Intact  Orientation:  Full (Time, Place, and Person)  Thought Content:  Rumination  Suicidal Thoughts:  No  Homicidal Thoughts:  No  Memory:  Immediate;   Good Recent;   Good Remote;   Good  Judgement:  Fair  Insight:  Shallow  Psychomotor  Activity:  Increased  Concentration:  Concentration: Fair and Attention Span: Fair  Recall:  Good  Fund of Knowledge:  Fair  Language:  Good  Akathisia:  No  Handed:  Right  AIMS (if indicated):     Assets:  Communication Skills Desire for Improvement Housing Talents/Skills Transportation  ADL's:  Intact  Cognition:  WNL  Sleep:   poor sleep. Only 2-3 hrs     Assessment/Plan:  Stephanie Cannon is 74 year old African-American, employed, married female who is referred from her PCP.  I reviewed current medication, previous records, blood work results and psychosocial stressors.  Patient do have mood symptoms which she described impulsive behavior, irritability, poor sleep, and anxiety.  Patient also have chronic insomnia and chronic pain.  In the past she had tried Prozac and temazepam.  Her current medicine are as managed by PCP which are trazodone 100 mg at bedtime, Xanax 1 mg at bedtime and BuSpar 15 mg 3 times a day.  I recommend to discontinue BuSpar and try Lamictal 25 mg daily for 1 week and then 50 mg daily.  I explained that we are treating her mood symptoms not her insomnia and chronic pain.  However I do recommend she should see a rheumatologist and get a sleep study.  She described most of the symptoms are due to poor sleep and chronic pain.  I also recommend to see a therapist in our office.  She agreed.  Discussed medication side effects and benefits specially if started to have any rash then she need to stop the medication Lamictal immediately.  For now she will continue all her other psychotropic medication from her PCP.  Recommended to call us back if she has any question or any concern.  Follow-up in 3 weeks.  Kathlee Nations, MD 08/23/2021    Follow Up Instructions: I discussed the assessment and treatment plan with the patient. The patient was provided an opportunity to ask questions and all were answered. The patient agreed with the plan and demonstrated an understanding of  the instructions.   The patient was advised to call back or seek an in-person evaluation if the symptoms worsen or if the condition fails to improve as anticipated.   Collaboration of Care: Primary Care Provider AEB notes are available in epic to review.   Patient/Guardian was advised Release of Information must be obtained prior to any record release in order to collaborate their care with an outside provider. Patient/Guardian was advised if they have not already done so to contact the registration department to sign all necessary forms in order for [place] to release information regarding their care.    Consent: Patient/Guardian gives verbal consent for treatment and assignment of benefits for services provided during this visit. Patient/Guardian expressed understanding and agreed to proceed.     I provided 17 minutes of non-face-to-face time during this encounter.

## 2021-08-25 ENCOUNTER — Encounter: Payer: Self-pay | Admitting: *Deleted

## 2021-08-25 NOTE — Telephone Encounter (Signed)
Note has been written and patient picked up-08/25/21

## 2021-08-25 NOTE — Progress Notes (Signed)
Note written, given to patient 08/25/21

## 2021-09-17 ENCOUNTER — Encounter: Payer: Self-pay | Admitting: Internal Medicine

## 2021-09-17 ENCOUNTER — Ambulatory Visit (INDEPENDENT_AMBULATORY_CARE_PROVIDER_SITE_OTHER): Payer: BC Managed Care – PPO | Admitting: Internal Medicine

## 2021-09-17 ENCOUNTER — Encounter: Payer: Self-pay | Admitting: Family Medicine

## 2021-09-17 ENCOUNTER — Encounter: Payer: Self-pay | Admitting: Podiatry

## 2021-09-17 DIAGNOSIS — M539 Dorsopathy, unspecified: Secondary | ICD-10-CM

## 2021-09-17 DIAGNOSIS — M542 Cervicalgia: Secondary | ICD-10-CM

## 2021-09-17 MED ORDER — MELOXICAM 7.5 MG PO TABS: 7.5000 mg | ORAL_TABLET | Freq: Every day | ORAL | 0 refills | Status: AC

## 2021-09-17 NOTE — Progress Notes (Signed)
   Virtual Visit via Telephone Note  I connected with Stephanie Cannon on 09/17/21 at  9:40 AM EDT by telephone and verified that I am speaking with the correct person using two identifiers.  Location: Patient: [facility] Provider: [facility] [phone]. [address] in [place], [place] [address]   I discussed the limitations, risks, security and privacy concerns of performing an evaluation and management service by telephone and the availability of in person appointments. I also discussed with the patient that there may be a patient responsible charge related to this service. The patient expressed understanding and agreed to proceed.   History of Present Illness: Stephanie Cannon was evaluated today via telephone encounter for an acute visit to discuss neck and shoulder pain.  She has a known history of multilevel degenerative disc disease as noted on cervical spine x-rays obtained 08/11/2021.  Stephanie Cannon states that her pain has been present for the last several weeks but has worsened recently.  She is taking Flexeril nightly and occasionally using ibuprofen.  She is requesting medication today for symptom relief because she is in constant pain.  The pain is impairing her sleep at night.  She denies new areas of pain.  Pain is in the same pattern, but is increased in intensity.  There is no numbness/weakness in her upper extremities.  She is not having any associated fever/chills, night sweats, or unintentional weight loss.   Assessment and Plan:  Cervicalgia Multilevel degenerative disc disease of the cervical spine I discussed with Stephanie Cannon that it is very common to have symptoms of neck and shoulder pain in the setting of known multilevel degenerative disc disease.  We discussed treatment options and she was agreeable with trialing meloxicam 7.5 mg daily x14 days to reduce inflammation.  I instructed her to continue use of Flexeril as needed at night.  In  addition to these medications, I have provided Stephanie Cannon with home exercises for her neck to improve her mobility and strength in the muscles surrounding her cervical spine.  She expressed understanding.   Follow Up Instructions: She has follow-up scheduled for 10/06/2021 with Dr. Moshe Cipro.   I discussed the assessment and treatment plan with the patient. The patient was provided an opportunity to ask questions and all were answered. The patient agreed with the plan and demonstrated an understanding of the instructions.   The patient was advised to call back or seek an in-person evaluation if the symptoms worsen or if the condition fails to improve as anticipated.  I provided 9 minutes of non-face-to-face time during this encounter.   Johnette Abraham, MD

## 2021-09-18 ENCOUNTER — Other Ambulatory Visit: Payer: Self-pay | Admitting: Family Medicine

## 2021-09-20 ENCOUNTER — Encounter: Payer: Self-pay | Admitting: Family Medicine

## 2021-09-27 ENCOUNTER — Ambulatory Visit (INDEPENDENT_AMBULATORY_CARE_PROVIDER_SITE_OTHER): Payer: BC Managed Care – PPO | Admitting: Podiatry

## 2021-09-27 ENCOUNTER — Ambulatory Visit (INDEPENDENT_AMBULATORY_CARE_PROVIDER_SITE_OTHER): Payer: BC Managed Care – PPO

## 2021-09-27 DIAGNOSIS — Z09 Encounter for follow-up examination after completed treatment for conditions other than malignant neoplasm: Secondary | ICD-10-CM

## 2021-09-27 DIAGNOSIS — Z9889 Other specified postprocedural states: Secondary | ICD-10-CM

## 2021-09-27 NOTE — Progress Notes (Signed)
   Chief Complaint  Patient presents with   bilateral swelling and pain     Patient is here for bilateral welling and pain on the great toes, patient states that she just wanted the provider to check the incision sites.    Subjective:  Patient presents today status post cheilectomy bilateral great toes. DOS: 07/08/2021.  Patient was concerned because she did have some stinging and numbness sensation to the bilateral cheilectomy sites.  She presents today to have them evaluated  Past Medical History:  Diagnosis Date   Arthritis    Asthma    Phreesia 07/31/2019   Breast discharge    left spont white discharge x 6 months   Carpal tunnel syndrome    left, wears brace at night   Chronic back pain    GERD (gastroesophageal reflux disease)    Hyperlipidemia    Hypertension    Obesity       Objective/Physical Exam Neurovascular status intact.  Skin incisions healed.  Good range of motion to the bilateral great toe joints.  No appreciable pain with palpation or range of motion.  There can seems to be some very mild localized edema around the surgical area.  No warmth or erythema  Radiographic exam B/L feet 09/27/2021 No significant change since prior x-rays.  Osteotomy/cheilectomy sites appear clean.  With joint spaces preserved.   Assessment: 1. s/p cheilectomy bilateral great toes. DOS: 07/08/2021   Plan of Care:  1. Patient was evaluated.  2.  Patient is doing well both clinically and radiographically. 3.  Continue wearing good supportive shoes and sneakers 4.  Return to clinic as needed  *Registrar at [facility]  Edrick Kins, DPM [facility]  Dr. Edrick Kins, DPM    [address] [place], [place] [address]                Office [phone]  Fax [phone]

## 2021-09-27 NOTE — Addendum Note (Signed)
Addended by: Myles Rosenthal on: 09/27/2021 09:13 AM   Modules accepted: Orders

## 2021-09-29 ENCOUNTER — Telehealth (HOSPITAL_BASED_OUTPATIENT_CLINIC_OR_DEPARTMENT_OTHER): Payer: BC Managed Care – PPO | Admitting: Psychiatry

## 2021-09-29 ENCOUNTER — Encounter (HOSPITAL_COMMUNITY): Payer: Self-pay | Admitting: Psychiatry

## 2021-09-29 VITALS — Wt 207.0 lb

## 2021-09-29 DIAGNOSIS — F39 Unspecified mood [affective] disorder: Secondary | ICD-10-CM | POA: Diagnosis not present

## 2021-09-29 DIAGNOSIS — F411 Generalized anxiety disorder: Secondary | ICD-10-CM | POA: Diagnosis not present

## 2021-09-29 MED ORDER — LAMOTRIGINE 100 MG PO TABS: ORAL_TABLET | ORAL | 1 refills | Status: DC

## 2021-09-29 NOTE — Progress Notes (Signed)
Virtual Visit via Video Note  I connected with Terisa Starr on 09/29/21 at  2:40 PM EDT by a video enabled telemedicine application and verified that I am speaking with the correct person using two identifiers.  Location: Patient: Stephanie Cannon Provider: Home Office   I discussed the limitations of evaluation and management by telemedicine and the availability of Stephanie person appointments. The patient expressed understanding and agreed to proceed.  History of Present Illness: Patient is 51 year old African-American, employed married female who was seen first time 6 weeks ago.  She was referred from her primary care physician Dr. Moshe Cipro for management of her psychiatric symptoms.  Stephanie the beginning she mentioned about her chronic health issues, insomnia and pain may be the reason that she is not sleeping well and having a lot of anxiety but later admitted mood symptoms.  We started her on Lamictal 25 mg.  She has prescribed Xanax, trazodone and BuSpar by PCP however recommended to stop the BuSpar.  She liked the Lamictal and she noticed improvement Stephanie her irritability, mood swings.  She is sleeping better.  She like to increase the dose because she noticed getting better to control her behavior.  She does go to church and able to handle crowded places but still have some anxiety going to the grocery stores.  She denies any hallucination, paranoia or any suicidal thoughts.  She is tolerating her medication as prescribed and denies any rash, itching tremors or shakes.  Patient has prescribed Ozempic but has not picked up the medicine yet.  She has upcoming appointment to see her PCP and she will discuss to see a rheumatologist and get a sleep study.  Past Psychiatric History:  No h/o inpatient treatment or suicidal attempt.  Saw Dr. Harrington Challenger twice but no further follow-ups.  Saw therapist but reported to help her son. Tried Prozac temazepam, Ambien, melatonin and recently BuSpar. No history of  psychosis.  Psychiatric Specialty Exam: Physical Exam  Review of Systems  Weight 207 lb (93.9 kg).Body mass index is 39.11 kg/m.  General Appearance: Casual  Eye Contact:  Fair  Speech:  Normal Rate  Volume:  Normal  Mood:  Anxious  Affect:  Congruent  Thought Process:  Descriptions of Associations: Intact  Orientation:  Full (Time, Place, and Person)  Thought Content:  Rumination  Suicidal Thoughts:  No  Homicidal Thoughts:  No  Memory:  Immediate;   Good Recent;   Good Remote;   Good  Judgement:  Intact  Insight:  Present  Psychomotor Activity:  Normal  Concentration:  Concentration: Fair and Attention Span: Fair  Recall:  [place] of Knowledge:  Good  Language:  Good  Akathisia:  No  Handed:  Right  AIMS (if indicated):     Assets:  Communication Skills Desire for Improvement Resilience Transportation  ADL's:  Intact  Cognition:  WNL  Sleep:   better    Assessment and Plan: Mood disorder NOS.  Generalized anxiety disorder.  Patient reported marginal improvement with low-dose Lamictal.  She is okay to try higher dose.  We will start 50 mg for 1 week and then 100 mg daily.  Encouraged to consider therapy and talk to her PCP about getting a sleep study.  Reminded that Lamictal can cause rash and that patient need to stop the medication immediately.  She is no longer taking BuSpar.  Continue trazodone and Xanax from her PCP.  However I recommend she can try higher dose of trazodone and avoid using  benzodiazepine due to dependency issues.  Recommended to call us back if she has any question or any concern.  Follow-up Stephanie 2 months.  Discuss safety concerns at any time having active suicidal thoughts or homicidal thought that she need to call 911 or go to local emergency room.    Follow Up Instructions:    I discussed the assessment and treatment plan with the patient. The patient was provided an opportunity to ask questions and all were answered. The patient agreed with  the plan and demonstrated an understanding of the instructions.   The patient was advised to call back or seek an Stephanie-person evaluation if the symptoms worsen or if the condition fails to improve as anticipated.  Collaboration of Care: Other provider involved Stephanie patient's care AEB notes are available Stephanie epic to review.  Patient/Guardian was advised Release of Information must be obtained prior to any record release Stephanie order to collaborate their care with an outside provider. Patient/Guardian was advised if they have not already done so to contact the registration department to sign all necessary forms Stephanie order for [place] to release information regarding their care.   Consent: Patient/Guardian gives verbal consent for treatment and assignment of benefits for services provided during this visit. Patient/Guardian expressed understanding and agreed to proceed.    I provided 31 minutes of non-face-to-face time during this encounter.   Kathlee Nations, MD

## 2021-10-06 ENCOUNTER — Ambulatory Visit: Payer: BC Managed Care – PPO | Admitting: Family Medicine

## 2021-10-06 ENCOUNTER — Ambulatory Visit: Payer: BC Managed Care – PPO | Admitting: Podiatry

## 2021-10-06 ENCOUNTER — Ambulatory Visit (HOSPITAL_COMMUNITY): Payer: Self-pay | Admitting: Licensed Clinical Social Worker

## 2021-10-12 ENCOUNTER — Ambulatory Visit (INDEPENDENT_AMBULATORY_CARE_PROVIDER_SITE_OTHER): Payer: BC Managed Care – PPO | Admitting: Neurology

## 2021-10-12 ENCOUNTER — Encounter: Payer: Self-pay | Admitting: Neurology

## 2021-10-12 VITALS — BP 137/84 | HR 71 | Ht 61.0 in | Wt 212.5 lb

## 2021-10-12 DIAGNOSIS — M542 Cervicalgia: Secondary | ICD-10-CM | POA: Diagnosis not present

## 2021-10-12 DIAGNOSIS — G5712 Meralgia paresthetica, left lower limb: Secondary | ICD-10-CM | POA: Insufficient documentation

## 2021-10-12 DIAGNOSIS — R202 Paresthesia of skin: Secondary | ICD-10-CM | POA: Diagnosis not present

## 2021-10-12 DIAGNOSIS — G8929 Other chronic pain: Secondary | ICD-10-CM | POA: Insufficient documentation

## 2021-10-12 MED ORDER — DULOXETINE HCL 60 MG PO CPEP: 60.0000 mg | ORAL_CAPSULE | Freq: Every day | ORAL | 5 refills | Status: DC

## 2021-10-12 NOTE — Progress Notes (Addendum)
Chief Complaint  Patient presents with   New Patient (Initial Visit)    Rm 12. Alone. NP/Internal referral for left thigh numbness progressing  x 6 months.      ASSESSMENT AND PLAN  Stephanie Cannon is a 51 y.o. female  Left thigh numbness,  Most consistent with left meralgia paresthetica  Adding Cymbalta 60 mg daily Gait abnormality  Brisk reflex on examination, no history of lumbar degenerative disease,  MRI of cervical spine to rule out cervical spondylitic myelopathy   DIAGNOSTIC DATA (LABS, IMAGING, TESTING) - I reviewed patient records, labs, notes, testing and imaging myself where available.  Addendum: MRI of the cervical without contrast report from [facility] November 16, 2021, abnormal tissue in the right side of the nasopharynx, recommend ENT evaluation and direct visualization to exclude tumor, Arnold-Chiari malformation, cerebellar tonsillar extent 8 mm below foramen magnum, slightly pointed, appears to have some crowding of the foramen magnum,  MEDICAL HISTORY:  Stephanie STROTHER is a 51 year old female, seen in request by her primary care physician Dr. Moshe Cipro, [place], for evaluation of left thigh numbness, initial evaluation in October 12, 2021  I reviewed and summarized the referring note. PMHX Depression, Anxiety Chronic insomnia GERD HTN Right CTS release, did help Obesity Gastric bypass in 2016, lost 100 LB Lumbar decompression surgery in 2016 for severe low back pain, left radiculopathy, Dr. Arnoldo Morale  She has a history of lumbar decompression surgery in 2016 for severe low back pain, left lower extremity radiating pain, surgery did help her symptoms, she worked best for job, sitting for prolonged hours but still had intermittent low back pain, especially with prolonged sitting  Since February 2023, she noticed left lateral thigh area numbness, burning, tingly sensation, been persistent since its onset  She has a long history of urinary urgency,  no worsening, no gait abnormality, no bower incontinence   PHYSICAL EXAM:   Vitals:   10/12/21 1605  BP: 137/84  Pulse: 71  Weight: 212 lb 8 oz (96.4 kg)  Height: '5\' 1"'$  (1.549 m)   Not recorded     Body mass index is 40.15 kg/m.  PHYSICAL EXAMNIATION:  Gen: NAD, conversant, well nourised, well groomed                     Cardiovascular: Regular rate rhythm, no peripheral edema, warm, nontender. Eyes: Conjunctivae clear without exudates or hemorrhage Neck: Supple, no carotid bruits. Pulmonary: Clear to auscultation bilaterally   NEUROLOGICAL EXAM:  MENTAL STATUS: Speech/cognition: Awake, alert, oriented to history taking and casual conversation CRANIAL NERVES: CN II: Visual fields are full to confrontation. Pupils are round equal and briskly reactive to light. CN III, IV, VI: extraocular movement are normal. No ptosis. CN V: Facial sensation is intact to light touch CN VII: Face is symmetric with normal eye closure  CN VIII: Hearing is normal to causal conversation. CN IX, X: Phonation is normal. CN XI: Head turning and shoulder shrug are intact  MOTOR: Mild right ankle dorsiflexion weakness  REFLEXES: Reflexes are 2+ and symmetric at the biceps, triceps, knees, and ankles. Plantar responses are flexor.  SENSORY: Decreased sensation at the left lateral thigh  COORDINATION: There is no trunk or limb dysmetria noted.  GAIT/STANCE: Need to push hard to get up from sitting position, steady, cautious  REVIEW OF SYSTEMS:  Full 14 system review of systems performed and notable only for as above All other review of systems were negative.   ALLERGIES: Allergies  Allergen Reactions   Codeine Itching   Morphine Itching    HOME MEDICATIONS: Current Outpatient Medications  Medication Sig Dispense Refill   ALPRAZolam (XANAX) 1 MG tablet Take 1 tablet (1 mg total) by mouth at bedtime. 90 tablet 0   cyclobenzaprine (FLEXERIL) 10 MG tablet Take 1 tablet (10 mg  total) by mouth at bedtime. 90 tablet 1   ergocalciferol (VITAMIN D2) 1.25 MG (50000 UT) capsule Take 1 capsule (50,000 Units total) by mouth once a week. One capsule once weekly 12 capsule 3   esomeprazole (NEXIUM) 40 MG capsule Take 1 capsule (40 mg total) by mouth daily. 90 capsule 1   lamoTRIgine (LAMICTAL) 100 MG tablet Take 1/2 tab daily for a week and than full tab daily 30 tablet 1   lidocaine (XYLOCAINE) 5 % ointment Apply 1 Application topically as needed. 35.44 g 0   Multiple Vitamins-Minerals (MULTIVITAMIN GUMMIES ADULT PO) Take 1 each by mouth 2 (two) times daily.     ondansetron (ZOFRAN) 4 MG tablet TAKE (1) TABLET BY MOUTH EVERY EIGHT HOURS AS NEEDED FOR NAUSEA/VOMITING. 20 tablet 0   potassium chloride (KLOR-CON) 10 MEQ tablet Take 1 tablet (10 mEq total) by mouth daily. 90 tablet 1   solifenacin (VESICARE) 5 MG tablet Take 1 tablet (5 mg total) by mouth daily. 90 tablet 1   traZODone (DESYREL) 100 MG tablet TAKE (1) TABLET BY MOUTH AT BEDTIME. 30 tablet 0   triamterene-hydrochlorothiazide (MAXZIDE) 75-50 MG tablet Take 1 tablet by mouth daily. 90 tablet 1   No current facility-administered medications for this visit.    PAST MEDICAL HISTORY: Past Medical History:  Diagnosis Date   Arthritis    Asthma    Phreesia 07/31/2019   Breast discharge    left spont white discharge x 6 months   Carpal tunnel syndrome    left, wears brace at night   Chronic back pain    GERD (gastroesophageal reflux disease)    Hyperlipidemia    Hypertension    Obesity     PAST SURGICAL HISTORY: Past Surgical History:  Procedure Laterality Date   ABDOMINAL HYSTERECTOMY  2003   partial   BREAST BIOPSY Left 2018   benign   CARPAL TUNNEL RELEASE Right    CESAREAN SECTION  1988   CESAREAN SECTION N/A    Phreesia 07/31/2019   CHOLECYSTECTOMY     COLONOSCOPY N/A 09/21/2012   Procedure: COLONOSCOPY;  Surgeon: Danie Binder, MD;  Location: [facility];  Service: Endoscopy;  Laterality:  N/A;  9:30   LAPAROSCOPIC GASTRIC SLEEVE RESECTION WITH HIATAL HERNIA REPAIR N/A 06/23/2014   Procedure: LAPAROSCOPIC LYSIS OF ADHESIONS, GASTRIC SLEEVE RESECTION AND UPPER ENDO;  Surgeon: Alphonsa Overall, MD;  Location: [facility];  Service: General;  Laterality: N/A;   LAPAROSCOPIC LYSIS OF ADHESIONS  06/23/2014   Procedure: LAPAROSCOPIC LYSIS OF ADHESIONS;  Surgeon: Alphonsa Overall, MD;  Location: [facility];  Service: General;;   LUMBAR FUSION  01/22/2014   L4 L5       DR Arnoldo Morale   other     Vocal chord polyp removal   ovarian tumor  2005   ovaries removed    SPINE SURGERY N/A    Phreesia 07/31/2019   TONSILLECTOMY     UPPER GI ENDOSCOPY  06/23/2014   Procedure: UPPER GI ENDOSCOPY;  Surgeon: Alphonsa Overall, MD;  Location: [facility];  Service: General;;    FAMILY HISTORY: Family History  Problem Relation Age of Onset   Hypertension Mother  Hypertension Father    Hypertension Brother    Cancer Maternal Aunt    Cancer Maternal Uncle        liver   Cancer Paternal Uncle        bone cancer   Diabetes Maternal Grandmother    Colon cancer Neg Hx     SOCIAL HISTORY: Social History   Socioeconomic History   Marital status: Married    Spouse name: Not on file   Number of children: Not on file   Years of education: Not on file   Highest education level: Not on file  Occupational History   Occupation: Soil scientist: [place]    Comment: [employer]   Tobacco Use   Smoking status: Former    Packs/day: 0.75    Years: 17.00    Total pack years: 12.75    Types: Cigarettes    Quit date: 01/03/2009    Years since quitting: 12.7   Smokeless tobacco: Never  Vaping Use   Vaping Use: Never used  Substance and Sexual Activity   Alcohol use: Yes    Comment: occasionally   Drug use: No   Sexual activity: Yes  Other Topics Concern   Not on file  Social History Narrative   Not on file   Social Determinants of Health   Financial Resource Strain: Not on file  Food Insecurity:  Not on file  Transportation Needs: Not on file  Physical Activity: Not on file  Stress: Not on file  Social Connections: Not on file  Intimate Partner Violence: Not on file      Marcial Pacas, M.D. Ph.D.  [facility] [address], [place], [place] [address] Ph: [phone] Fax: [phone]  CC:  Fayrene Helper, MD [address] [place],  [place] [address]  Fayrene Helper, MD

## 2021-10-14 ENCOUNTER — Telehealth: Payer: Self-pay | Admitting: Neurology

## 2021-10-14 NOTE — Telephone Encounter (Signed)
BCBS sent to [facility] they do auth and sent to facility [phone]

## 2021-10-18 ENCOUNTER — Ambulatory Visit: Payer: BC Managed Care – PPO | Admitting: Podiatry

## 2021-10-18 ENCOUNTER — Other Ambulatory Visit: Payer: Self-pay | Admitting: Family Medicine

## 2021-10-21 ENCOUNTER — Ambulatory Visit (INDEPENDENT_AMBULATORY_CARE_PROVIDER_SITE_OTHER): Payer: BC Managed Care – PPO | Admitting: Family Medicine

## 2021-10-21 ENCOUNTER — Encounter: Payer: Self-pay | Admitting: Family Medicine

## 2021-10-21 VITALS — BP 129/83 | HR 81 | Ht 61.0 in | Wt 210.1 lb

## 2021-10-21 DIAGNOSIS — I10 Essential (primary) hypertension: Secondary | ICD-10-CM | POA: Diagnosis not present

## 2021-10-21 DIAGNOSIS — Z23 Encounter for immunization: Secondary | ICD-10-CM | POA: Diagnosis not present

## 2021-10-21 DIAGNOSIS — F32A Depression, unspecified: Secondary | ICD-10-CM

## 2021-10-21 DIAGNOSIS — F419 Anxiety disorder, unspecified: Secondary | ICD-10-CM

## 2021-10-21 DIAGNOSIS — E785 Hyperlipidemia, unspecified: Secondary | ICD-10-CM | POA: Diagnosis not present

## 2021-10-21 DIAGNOSIS — G4709 Other insomnia: Secondary | ICD-10-CM

## 2021-10-21 NOTE — Patient Instructions (Signed)
F/U in 3 months, call if you need me sooner  Flu vaccine today  Covid vaccine in december  Thankful you are doing better  Fasting lipid panel to be drawn at workplace, and chem 7 and eGFR  It is important that you exercise regularly at least 30 minutes 5 times a week. If you develop chest pain, have severe difficulty breathing, or feel very tired, stop exercising immediately and seek medical attention  Thanks for choosing [facility], we consider it a privelige to serve you.

## 2021-10-24 ENCOUNTER — Encounter: Payer: Self-pay | Admitting: Family Medicine

## 2021-10-24 MED ORDER — ALPRAZOLAM 1 MG PO TABS: 1.0000 mg | ORAL_TABLET | Freq: Every day | ORAL | 0 refills | Status: DC

## 2021-10-24 NOTE — Assessment & Plan Note (Signed)
Sleep hygiene reviewed and written information offered also. Prescription sent for  medication needed.  

## 2021-10-24 NOTE — Progress Notes (Signed)
Stephanie Cannon     MRN: [id number]      DOB: [birth year]   HPI Stephanie Cannon is here for follow up and re-evaluation of chronic medical conditions, medication management and review of any available recent lab and radiology data.  Preventive health is updated, specifically  Cancer screening and Immunization.   Improved since starting treatment with psychiatry, reports that stress is job related primarily Having ongoing issues with FMLA and will be in touch when form is in office , long note already in chart as a message The PT denies any adverse reactions to current medications since the last visit.  There are no new concerns.  There are no specific complaints   ROS Denies recent fever or chills. Denies sinus pressure, nasal congestion, ear pain or sore throat. Denies chest congestion, productive cough or wheezing. Denies chest pains, palpitations and leg swelling Denies abdominal pain, nausea, vomiting,diarrhea or constipation.   Denies dysuria, frequency, hesitancy or incontinence. Chronic  joint pain,  and limitation in mobility. Denies headaches, seizures, numbness, or tingling. Denies depression, anxiety or insomnia. Denies skin break down or rash.   PE  BP 129/83 (BP Location: Right Arm, Patient Position: Sitting, Cuff Size: Large)   Pulse 81   Ht '5\' 1"'$  (1.549 m)   Wt 210 lb 1.3 oz (95.3 kg)   SpO2 97%   BMI 39.69 kg/m   Patient alert and oriented and in no cardiopulmonary distress.  HEENT: No facial asymmetry, EOMI,     Neck supple .  Chest: Clear to auscultation bilaterally.  CVS: S1, S2 no murmurs, no S3.Regular rate.  ABD: Soft non tender.   Ext: No edema  MS: decreased  ROM spine, shoulders, hips and knees.  Skin: Intact, no ulcerations or rash noted.  Psych: Good eye contact, normal affect. Memory intact not anxious or depressed appearing.  CNS: CN 2-12 intact, power,  normal throughout.no focal deficits noted.   Assessment & Plan  Anxiety and  depression Improved on current medication, management per Psych  Essential hypertension Controlled, no change in medication DASH diet and commitment to daily physical activity for a minimum of 30 minutes discussed and encouraged, as a part of hypertension management. The importance of attaining a healthy weight is also discussed.     10/21/2021    3:24 PM 10/12/2021    4:05 PM 09/29/2021    2:53 PM 08/23/2021    2:40 PM 08/15/2021   11:15 AM 08/15/2021   11:13 AM 08/11/2021    3:26 PM  BP/Weight  Systolic BP 767 341    937 902  Diastolic BP 83 84    89 81  Wt. (Lbs) 210.08 212.5   212  213  BMI 39.69 kg/m2 40.15 kg/m2   40.06 kg/m2  40.25 kg/m2     Information is confidential and restricted. Go to Review Flowsheets to unlock data.       Hyperlipidemia LDL goal <100 Hyperlipidemia:Low fat diet discussed and encouraged.   Lipid Panel  Lab Results  Component Value Date   CHOL 239 (H) 02/12/2021   HDL 69 02/12/2021   LDLCALC 157 (H) 02/12/2021   TRIG 74 02/12/2021   CHOLHDL 3.5 02/12/2021     Updated lab needed at/ before next visit.   Insomnia Sleep hygiene reviewed and written information offered also. Prescription sent for  medication needed.   Morbid obesity ([place])  Patient re-educated about  the importance of commitment to a  minimum of 150 minutes of exercise per  week as able.  The importance of healthy food choices with portion control discussed, as well as eating regularly and within a 12 hour window most days. The need to choose "clean , green" food 50 to 75% of the time is discussed, as well as to make water the primary drink and set a goal of 64 ounces water daily.       10/21/2021    3:24 PM 10/12/2021    4:05 PM 09/29/2021    2:53 PM  Weight /BMI  Weight 210 lb 1.3 oz 212 lb 8 oz   Height '5\' 1"'$  (1.549 m) '5\' 1"'$  (1.549 m)   BMI 39.69 kg/m2 40.15 kg/m2      Information is confidential and restricted. Go to Review Flowsheets to unlock data.

## 2021-10-24 NOTE — Assessment & Plan Note (Signed)
Hyperlipidemia:Low fat diet discussed and encouraged.   Lipid Panel  Lab Results  Component Value Date   CHOL 239 (H) 02/12/2021   HDL 69 02/12/2021   LDLCALC 157 (H) 02/12/2021   TRIG 74 02/12/2021   CHOLHDL 3.5 02/12/2021     Updated lab needed at/ before next visit.

## 2021-10-24 NOTE — Assessment & Plan Note (Signed)
Improved on current medication, management per Psych

## 2021-10-24 NOTE — Assessment & Plan Note (Signed)
  Patient re-educated about  the importance of commitment to a  minimum of 150 minutes of exercise per week as able.  The importance of healthy food choices with portion control discussed, as well as eating regularly and within a 12 hour window most days. The need to choose "clean , green" food 50 to 75% of the time is discussed, as well as to make water the primary drink and set a goal of 64 ounces water daily.       10/21/2021    3:24 PM 10/12/2021    4:05 PM 09/29/2021    2:53 PM  Weight /BMI  Weight 210 lb 1.3 oz 212 lb 8 oz   Height '5\' 1"'$  (1.549 m) '5\' 1"'$  (1.549 m)   BMI 39.69 kg/m2 40.15 kg/m2      Information is confidential and restricted. Go to Review Flowsheets to unlock data.

## 2021-10-24 NOTE — Assessment & Plan Note (Signed)
Controlled, no change in medication DASH diet and commitment to daily physical activity for a minimum of 30 minutes discussed and encouraged, as a part of hypertension management. The importance of attaining a healthy weight is also discussed.     10/21/2021    3:24 PM 10/12/2021    4:05 PM 09/29/2021    2:53 PM 08/23/2021    2:40 PM 08/15/2021   11:15 AM 08/15/2021   11:13 AM 08/11/2021    3:26 PM  BP/Weight  Systolic BP 284 132    440 102  Diastolic BP 83 84    89 81  Wt. (Lbs) 210.08 212.5   212  213  BMI 39.69 kg/m2 40.15 kg/m2   40.06 kg/m2  40.25 kg/m2     Information is confidential and restricted. Go to Review Flowsheets to unlock data.

## 2021-10-25 ENCOUNTER — Ambulatory Visit
Admission: RE | Admit: 2021-10-25 | Discharge: 2021-10-25 | Disposition: A | Discharge status: Home / Self Care | Payer: BC Managed Care – PPO | Source: Ambulatory Visit | Attending: Family Medicine | Admitting: Family Medicine

## 2021-10-28 ENCOUNTER — Ambulatory Visit (HOSPITAL_COMMUNITY): Payer: Self-pay | Admitting: Licensed Clinical Social Worker

## 2021-11-05 ENCOUNTER — Other Ambulatory Visit: Payer: Self-pay | Admitting: Family Medicine

## 2021-11-05 ENCOUNTER — Encounter: Payer: Self-pay | Admitting: Family Medicine

## 2021-11-05 DIAGNOSIS — R930 Abnormal findings on diagnostic imaging of skull and head, not elsewhere classified: Secondary | ICD-10-CM

## 2021-11-05 MED ORDER — ONDANSETRON HCL 4 MG PO TABS: 4.0000 mg | ORAL_TABLET | Freq: Three times a day (TID) | ORAL | 0 refills | Status: DC | PRN

## 2021-11-08 NOTE — Telephone Encounter (Signed)
Dillon Bjork: [id number] exp. 11/08/21-12/07/21 for [facility] scheduled 11/14

## 2021-11-11 MED ORDER — SEMAGLUTIDE-WEIGHT MANAGEMENT 0.5 MG/0.5ML ~~LOC~~ SOAJ: 0.5000 mg | SUBCUTANEOUS | 1 refills | Status: DC

## 2021-11-11 MED ORDER — ONDANSETRON HCL 4 MG PO TABS: ORAL_TABLET | ORAL | 1 refills | Status: DC

## 2021-11-15 ENCOUNTER — Other Ambulatory Visit: Payer: Self-pay | Admitting: Family Medicine

## 2021-11-22 ENCOUNTER — Telehealth: Payer: Self-pay | Admitting: Neurology

## 2021-11-22 ENCOUNTER — Telehealth: Payer: Self-pay

## 2021-11-22 NOTE — Telephone Encounter (Signed)
Pt is calling. Stated she was reading her MRI results and it said she need to go to a ENT. Pt want to know if Dr. Krista Blue will send the referral to an ENT.

## 2021-11-22 NOTE — Telephone Encounter (Signed)
Why we could not see [facility] report via care everywhere?

## 2021-11-22 NOTE — Telephone Encounter (Signed)
Received MRI Cervical Spine results from [facility]. Placed in MD's office for review.

## 2021-11-23 ENCOUNTER — Ambulatory Visit (HOSPITAL_COMMUNITY)
Admission: RE | Admit: 2021-11-23 | Discharge: 2021-11-23 | Disposition: A | Discharge status: Home / Self Care | Payer: BC Managed Care – PPO | Source: Ambulatory Visit | Attending: Internal Medicine | Admitting: Internal Medicine

## 2021-11-23 ENCOUNTER — Encounter: Payer: Self-pay | Admitting: Internal Medicine

## 2021-11-23 ENCOUNTER — Ambulatory Visit (INDEPENDENT_AMBULATORY_CARE_PROVIDER_SITE_OTHER): Payer: BC Managed Care – PPO | Admitting: Internal Medicine

## 2021-11-23 ENCOUNTER — Telehealth: Payer: Self-pay | Admitting: Family Medicine

## 2021-11-23 VITALS — BP 132/84 | HR 90 | Resp 16 | Ht 61.0 in | Wt 204.0 lb

## 2021-11-23 DIAGNOSIS — R109 Unspecified abdominal pain: Secondary | ICD-10-CM | POA: Diagnosis present

## 2021-11-23 DIAGNOSIS — J392 Other diseases of pharynx: Secondary | ICD-10-CM | POA: Diagnosis not present

## 2021-11-23 LAB — POCT URINALYSIS DIP (CLINITEK)
Bilirubin, UA: NEGATIVE
Glucose, UA: NEGATIVE mg/dL
Ketones, POC UA: NEGATIVE mg/dL
Nitrite, UA: NEGATIVE
POC PROTEIN,UA: 30 — AB
Spec Grav, UA: 1.01 (ref 1.010–1.025)
Urobilinogen, UA: 0.2 E.U./dL
pH, UA: 6 (ref 5.0–8.0)

## 2021-11-23 NOTE — Patient Instructions (Signed)
Thank you for trusting me with your care. To recap, today we discussed the following:   Right sided flank pain - Urinalysis - Urine Culture - CT ABDOMEN PELVIS WO CONTRAST   I will follow up with results.

## 2021-11-23 NOTE — Assessment & Plan Note (Signed)
Patient presents MRI results from [place]. MRI ordered at neurologist for cervical pain. Incidental finding of mass which could represent benign lymph tissue or tumor. Recommendation was to follow up with ENT for further evaluation. Referral placed to see Dr.Teoh.

## 2021-11-23 NOTE — Assessment & Plan Note (Addendum)
Patient began to have symptoms on Sunday. Feeling nauseas, and having bilateral back pain. Throbbing , 8/10 pain. Now only right side back pain and continued nausea. Has lost appetite.Noticed strong odor, but no dysuria or gross hematuria. No fever. Has seen a urologist for flank pain and recurrent UTI a few years ago. Suggested to drink more water and did not need treatment of UTI. Sexually active with her husband. No change in vaginal discharge or odor. Chronic pain with intercourse, she has discussed this with Ob/gyn. Previous cholecystectomy. moderate hematuria on dipstick and leukocytes. No nitrites.   Assessment/Plan: Acute right sided flank, expect kidney stones. Patient will continue Ibuprofen as needed for pain. Has Zofran for nausea. Given return precautions. Workup as below.  - Urinalysis - Urine Culture - BMP8+EGFR - CT ABDOMEN PELVIS WO CONTRAST

## 2021-11-23 NOTE — Progress Notes (Signed)
     CC: Referral (To ENT. Had a cervical MRI at [facility]. They want to rule out a tumor behind her larynx possibly. Has been having nausea and weakness and pain in her right side of back for the past few days. Left work yesterday because she was nauseated and didn't feel well )    HPI:[name] is a 51 y.o. female who presents for evaluation of right sided flank pain and referral to ENT for mass of nasopharynx seen on MRI. For the details of today's visit, please refer to the assessment and plan.  Past Medical History:  Diagnosis Date   Arthritis    Asthma    Phreesia 07/31/2019   Breast discharge    left spont white discharge x 6 months   Carpal tunnel syndrome    left, wears brace at night   Chronic back pain    GERD (gastroesophageal reflux disease)    Hyperlipidemia    Hypertension    Obesity      Physical Exam: Vitals:   11/23/21 1130  BP: 132/84  Pulse: 90  Resp: 16  SpO2: 96%  Weight: 204 lb (92.5 kg)  Height: _0  (1.549 m)     Physical Exam Constitutional:      General: She is not in acute distress.    Appearance: She is not ill-appearing.  Abdominal:     General: Bowel sounds are normal. There is no distension.     Palpations: There is no mass (surgical scars).     Tenderness: There is abdominal tenderness (generalized on right flank with deep palpation). There is no right CVA tenderness, left CVA tenderness, guarding or rebound. Negative signs include Murphy's sign.     Hernia: No hernia is present.  Skin:    General: Skin is warm and dry.      Assessment & Plan:   Mass of nasopharynx Patient presents MRI results from [place]. MRI ordered at neurologist for cervical pain. Incidental finding of mass which could represent benign lymph tissue or tumor. Recommendation was to follow up with ENT for further evaluation. Referral placed to see Dr.Teoh.   Right flank pain Patient began to have symptoms on Sunday. Feeling nauseas, and having  bilateral back pain. Throbbing , 8/10 pain. Now only right side back pain and continued nausea. Has lost appetite.Noticed strong odor, but no dysuria or gross hematuria. No fever. Has seen a urologist for flank pain and recurrent UTI a few years ago. Suggested to drink more water and did not need treatment of UTI. Sexually active with her husband. No change in vaginal discharge or odor. Chronic pain with intercourse, she has discussed this with Ob/gyn. Previous cholecystectomy. moderate hematuria on dipstick and leukocytes. No nitrites.   Assessment/Plan: Acute right sided flank, expect kidney stones. Patient will continue Ibuprofen as needed for pain. Has Zofran for nausea. Given return precautions. Workup as below.  - Urinalysis - Urine Culture - BMP8+EGFR - CT ABDOMEN PELVIS WO CONTRAST       Lorene Dy, MD

## 2021-11-23 NOTE — Telephone Encounter (Signed)
Stephanie Cannon with Dr. Deeann Saint office called in regard to recent referral.  They do not see adult throat patients.  Patient will need new referral.

## 2021-11-23 NOTE — Addendum Note (Signed)
Addended by: Eual Fines on: 11/23/2021 01:17 PM   Modules accepted: Orders

## 2021-11-23 NOTE — Telephone Encounter (Signed)
Please advise patient ask primary care for referral

## 2021-11-24 LAB — URINALYSIS
Bilirubin, UA: NEGATIVE
Glucose, UA: NEGATIVE
Ketones, UA: NEGATIVE
Nitrite, UA: NEGATIVE
Protein,UA: NEGATIVE
RBC, UA: NEGATIVE
Specific Gravity, UA: 1.012 (ref 1.005–1.030)
Urobilinogen, Ur: 0.2 mg/dL (ref 0.2–1.0)
pH, UA: 6 (ref 5.0–7.5)

## 2021-11-24 LAB — BMP8+EGFR
BUN/Creatinine Ratio: 9 (ref 9–23)
BUN: 9 mg/dL (ref 6–24)
CO2: 27 mmol/L (ref 20–29)
Calcium: 9.8 mg/dL (ref 8.7–10.2)
Chloride: 98 mmol/L (ref 96–106)
Creatinine, Ser: 1.03 mg/dL — ABNORMAL HIGH (ref 0.57–1.00)
Glucose: 77 mg/dL (ref 70–99)
Potassium: 3.7 mmol/L (ref 3.5–5.2)
Sodium: 141 mmol/L (ref 134–144)
eGFR: 65 mL/min/{1.73_m2} (ref >59)

## 2021-11-24 NOTE — Progress Notes (Signed)
Patient called and reviewed CT abdomen with her. She is having some upset stomach and I favor imaging results to represent gastritis. Given supportive care recommendations. I do not believe repeat imaging is needed in 6 months unless patient fails to improve or has new symptoms.

## 2021-11-28 LAB — URINE CULTURE

## 2021-11-29 ENCOUNTER — Telehealth: Payer: Self-pay | Admitting: Internal Medicine

## 2021-11-29 ENCOUNTER — Telehealth (HOSPITAL_COMMUNITY): Payer: BC Managed Care – PPO | Admitting: Psychiatry

## 2021-11-29 DIAGNOSIS — N3001 Acute cystitis with hematuria: Secondary | ICD-10-CM

## 2021-11-29 MED ORDER — CEFDINIR 300 MG PO CAPS: 300.0000 mg | ORAL_CAPSULE | Freq: Two times a day (BID) | ORAL | 0 refills | Status: AC

## 2021-11-29 NOTE — Telephone Encounter (Signed)
Final urine culture grew 50k to 100k colonies of Klebsiella pneumonia. Called patient and discussed results. She is still having some abdominal discomfort and strong urine smell. No dysuria and no dysuria with previous UTI's. Will treat with Cefdinir as below.  - cefdinir (OMNICEF) 300 MG capsule; Take 1 capsule (300 mg total) by mouth 2 (two) times daily for 7 days.  Dispense: 14 capsule; Refill: 0

## 2021-12-01 ENCOUNTER — Telehealth (HOSPITAL_COMMUNITY): Payer: BC Managed Care – PPO | Admitting: Psychiatry

## 2021-12-02 ENCOUNTER — Telehealth: Payer: Self-pay

## 2021-12-02 DIAGNOSIS — R9389 Abnormal findings on diagnostic imaging of other specified body structures: Secondary | ICD-10-CM

## 2021-12-02 DIAGNOSIS — R930 Abnormal findings on diagnostic imaging of skull and head, not elsewhere classified: Secondary | ICD-10-CM | POA: Insufficient documentation

## 2021-12-02 NOTE — Telephone Encounter (Signed)
Marcial Pacas, MD      12/02/21 11:38 AM Note Please call patient   MRI of the cervical without contrast report from [facility] November 16, 2021, abnormal tissue in the right side of the nasopharynx, recommend ENT evaluation and direct visualization to exclude tumor, Arnold-Chiari malformation, cerebellar tonsillar extent 8 mm below foramen magnum, slightly pointed, appears to have some crowding of the foramen magnum,   I have referred her to ENT, advised patient bring MRI CD at her ENT evaluation If she has significant worsening of gait abnormality, please let us know, may consider early follow-up appointment, if not, keep previously scheduled follow-up,

## 2021-12-02 NOTE — Telephone Encounter (Signed)
I called pt. No answer, left a message asking pt to call me back.   

## 2021-12-02 NOTE — Telephone Encounter (Signed)
I returned pt's call. She verbalized understanding and appreciation for the call. She is agreeable to plan and verbalized understanding to instructions.   She would prefer to see   Su Raynelle Bring, MD, PA 4.0 (45)  Otolaryngologist [place]  [phone]  For ENT evaluation.

## 2021-12-02 NOTE — Telephone Encounter (Signed)
Please call patient  MRI of the cervical without contrast report from [facility] November 16, 2021, abnormal tissue in the right side of the nasopharynx, recommend ENT evaluation and direct visualization to exclude tumor, Arnold-Chiari malformation, cerebellar tonsillar extent 8 mm below foramen magnum, slightly pointed, appears to have some crowding of the foramen magnum,  I have referred her to ENT, advised patient bring MRI CD at her ENT evaluation If she has significant worsening of gait abnormality, please let us know, may consider early follow-up appointment, if not, keep previously scheduled follow-up,

## 2021-12-02 NOTE — Telephone Encounter (Signed)
Error

## 2021-12-08 ENCOUNTER — Telehealth (HOSPITAL_BASED_OUTPATIENT_CLINIC_OR_DEPARTMENT_OTHER): Payer: BC Managed Care – PPO | Admitting: Psychiatry

## 2021-12-08 ENCOUNTER — Encounter (HOSPITAL_COMMUNITY): Payer: Self-pay | Admitting: Psychiatry

## 2021-12-08 ENCOUNTER — Telehealth: Payer: Self-pay | Admitting: Neurology

## 2021-12-08 VITALS — Wt 204.0 lb

## 2021-12-08 DIAGNOSIS — F411 Generalized anxiety disorder: Secondary | ICD-10-CM

## 2021-12-08 DIAGNOSIS — F39 Unspecified mood [affective] disorder: Secondary | ICD-10-CM

## 2021-12-08 MED ORDER — LAMOTRIGINE 100 MG PO TABS: ORAL_TABLET | ORAL | 2 refills | Status: DC

## 2021-12-08 NOTE — Telephone Encounter (Signed)
Done

## 2021-12-08 NOTE — Progress Notes (Signed)
Virtual Visit via Video Note  I connected with Terisa Starr on 12/08/21 at  4:20 PM EST by a video enabled telemedicine application and verified that I am speaking with the correct person using two identifiers.  Location: Patient: Work Provider: Home office   I discussed the limitations of evaluation and management by telemedicine and the availability of in person appointments. The patient expressed understanding and agreed to proceed.  History of Present Illness: Patient is evaluated by video session.  She is at work.  She is taking Lamictal 100 mg daily and noticed improvement in her mood, sleep and anger.  She reported much calmer and does not feel as anxious.  Her energy level is improved.  She is still take Xanax, trazodone and Cymbalta prescribed by other providers.  She started [facility] to help her weight loss last month.  She still have some anxiety going into crowded places but overall she noticed her anger is much better.  She has no tremors, rash, itching.  Her appetite is okay.  She denies any hallucination, paranoia or any suicidal thoughts.  Her weight is unchanged from the past but hoping to lose some once she is part of the weight loss medication.  She had a good Thanksgiving with the family and looking forward to have a good Christmas.  She is going with her immediate family to [facility].  She works at [employer] center at [employer] and her job is going well.  She lives with her husband and they have a 74 year old son who lives in [place].  Patient elderly parents live close by.  Patient denies drinking or using any illegal substances.  Patient recently saw the neurologist for follow-up and no new medication added.   Past Psychiatric History:  No h/o inpatient treatment or suicidal attempt.  Saw Dr. Harrington Challenger twice but no further follow-ups.  Saw therapist but reported to help her son. Tried Prozac temazepam, Ambien, melatonin and recently BuSpar. No history of  psychosis.  Recent Results (from the past 2160 hour(s))  Urine Culture     Status: Abnormal   Collection Time: 11/23/21  1:16 PM   Specimen: Urine   UR  Result Value Ref Range   Urine Culture, Routine Final report (A)    Organism ID, Bacteria Klebsiella pneumoniae (A)     Comment: Cefazolin <=4 ug/mL Cefazolin with an MIC <=16 predicts susceptibility to the oral agents cefaclor, cefdinir, cefpodoxime, cefprozil, cefuroxime, cephalexin, and loracarbef when used for therapy of uncomplicated urinary tract infections due to E. coli, Klebsiella pneumoniae, and Proteus mirabilis. 50,000-100,000 colony forming units per mL    Antimicrobial Susceptibility Comment     Comment:       ** S = Susceptible; I = Intermediate; R = Resistant **                    P = Positive; N = Negative             MICS are expressed in micrograms per mL    Antibiotic                 RSLT#1    RSLT#2    RSLT#3    RSLT#4 Amoxicillin/Clavulanic Acid    S Ampicillin                     R Cefepime  S Ceftriaxone                    S Cefuroxime                     S Ciprofloxacin                  S Ertapenem                      S Gentamicin                     S Imipenem                       S Levofloxacin                   S Meropenem                      S Nitrofurantoin                 S Piperacillin/Tazobactam        S Tetracycline                   S Tobramycin                     S Trimethoprim/Sulfa             S   POCT URINALYSIS DIP (CLINITEK)     Status: Abnormal   Collection Time: 11/23/21  1:16 PM  Result Value Ref Range   Color, UA yellow yellow   Clarity, UA clear clear   Glucose, UA negative negative mg/dL   Bilirubin, UA negative negative   Ketones, POC UA negative negative mg/dL   Spec Grav, UA 1.010 1.010 - 1.025   Blood, UA moderate (A) negative   pH, UA 6.0 5.0 - 8.0   POC PROTEIN,UA =30 (A) negative, trace   Urobilinogen, UA 0.2 0.2 or 1.0 E.U./dL    Nitrite, UA Negative Negative   Leukocytes, UA Trace (A) Negative  BMP8+EGFR     Status: Abnormal   Collection Time: 11/23/21  3:18 PM  Result Value Ref Range   Glucose 77 70 - 99 mg/dL   BUN 9 6 - 24 mg/dL   Creatinine, Ser 1.03 (H) 0.57 - 1.00 mg/dL   eGFR 65 >59 mL/min/1.73   BUN/Creatinine Ratio 9 9 - 23   Sodium 141 134 - 144 mmol/L   Potassium 3.7 3.5 - 5.2 mmol/L   Chloride 98 96 - 106 mmol/L   CO2 27 20 - 29 mmol/L   Calcium 9.8 8.7 - 10.2 mg/dL  Urinalysis     Status: Abnormal   Collection Time: 11/23/21  3:44 PM  Result Value Ref Range   Specific Gravity, UA 1.012 1.005 - 1.030   pH, UA 6.0 5.0 - 7.5   Color, UA Yellow Yellow   Appearance Ur Clear Clear   Leukocytes,UA Trace (A) Negative   Protein,UA Negative Negative/Trace   Glucose, UA Negative Negative   Ketones, UA Negative Negative   RBC, UA Negative Negative   Bilirubin, UA Negative Negative   Urobilinogen, Ur 0.2 0.2 - 1.0 mg/dL   Nitrite, UA Negative Negative      Psychiatric Specialty Exam: Physical Exam  Review of Systems  Weight 204 lb (92.5 kg).There is no height or weight on file to  calculate BMI.  General Appearance: Casual  Eye Contact:  Good  Speech:  Clear and Coherent  Volume:  Normal  Mood:  Euthymic  Affect:  Appropriate  Thought Process:  Goal Directed  Orientation:  Full (Time, Place, and Person)  Thought Content:  Logical  Suicidal Thoughts:  No  Homicidal Thoughts:  No  Memory:  Immediate;   Good Recent;   Good Remote;   Good  Judgement:  Intact  Insight:  Present  Psychomotor Activity:  Normal  Concentration:  Concentration: Good and Attention Span: Good  Recall:  Good  Fund of Knowledge:  Good  Language:  Good  Akathisia:  No  Handed:  Right  AIMS (if indicated):     Assets:  Communication Skills Desire for [place] Talents/Skills Transportation  ADL's:  Intact  Cognition:  WNL  Sleep:   6-1/2 hours      Assessment and  Plan: Mood disorder NOS.  Generalized anxiety disorder.  Patient doing better with Lamictal.  She is taking 100 mg daily.  She has no rash, itching tremors or shakes.  Her sleep improved to 6-1/2 hours but she still have residual anxiety when she go to crowded places.  Recommend to continue Lamictal 100 mg daily and follow-up in 3 months.  She is getting trazodone, Xanax from PCP and Cymbalta from her neurologist.  I review collateral information and labs from other providers.  She is hoping to lose weight once she started weight loss medication.  I recommend to call us back if she has any question or any concern.  Recommend to reach out to her resources when she is in crisis.  Follow Up Instructions:    I discussed the assessment and treatment plan with the patient. The patient was provided an opportunity to ask questions and all were answered. The patient agreed with the plan and demonstrated an understanding of the instructions.   The patient was advised to call back or seek an in-person evaluation if the symptoms worsen or if the condition fails to improve as anticipated.   Collaboration of Care: Other provider involved in patient's care AEB notes are available in epic to review.  Patient/Guardian was advised Release of Information must be obtained prior to any record release in order to collaborate their care with an outside provider. Patient/Guardian was advised if they have not already done so to contact the registration department to sign all necessary forms in order for [place] to release information regarding their care.   Consent: Patient/Guardian gives verbal consent for treatment and assignment of benefits for services provided during this visit. Patient/Guardian expressed understanding and agreed to proceed.    I provided 23 minutes of non-face-to-face time during this encounter.   Kathlee Nations, MD

## 2021-12-08 NOTE — Telephone Encounter (Signed)
Need a new referral for pt's request for ENT to Dr. Benjamine Mola. Could you enter a new referral for Dr. Benjamine Mola? Thank you.

## 2021-12-08 NOTE — Addendum Note (Signed)
Addended by: Verlin Grills on: 12/08/2021 11:29 AM   Modules accepted: Orders

## 2021-12-08 NOTE — Telephone Encounter (Signed)
Noted, thank you

## 2021-12-08 NOTE — Telephone Encounter (Signed)
Referral for ENT fax to Dr. Su Teoh. Phone: [phone], Fax: [phone] 

## 2021-12-13 NOTE — Telephone Encounter (Signed)
Received a fax from Dr. Benjamine Mola that he no longer sees adults with throat/neck issue. Ears and nose only. Please send referral elsewhere

## 2021-12-13 NOTE — Telephone Encounter (Signed)
Dr. Benjamine Mola no longer accepting referral for adults with throat/neck issues. Where would you like referral sent?

## 2021-12-13 NOTE — Telephone Encounter (Signed)
[facility] [address]  [place] [address] [place] [address]

## 2021-12-14 NOTE — Telephone Encounter (Signed)
Referral for ENT resent to [facility] as requested by neurologist.  Phone: [phone], Fax: [phone]

## 2021-12-15 ENCOUNTER — Other Ambulatory Visit: Payer: Self-pay | Admitting: Family Medicine

## 2021-12-15 DIAGNOSIS — M62830 Muscle spasm of back: Secondary | ICD-10-CM

## 2022-01-01 ENCOUNTER — Other Ambulatory Visit: Payer: Self-pay | Admitting: Family Medicine

## 2022-01-04 ENCOUNTER — Encounter: Payer: Self-pay | Admitting: Family Medicine

## 2022-01-04 ENCOUNTER — Ambulatory Visit (INDEPENDENT_AMBULATORY_CARE_PROVIDER_SITE_OTHER): Payer: BC Managed Care – PPO | Admitting: Family Medicine

## 2022-01-04 VITALS — BP 138/84 | HR 80 | Ht 61.0 in | Wt 212.1 lb

## 2022-01-04 DIAGNOSIS — E785 Hyperlipidemia, unspecified: Secondary | ICD-10-CM

## 2022-01-04 DIAGNOSIS — F5105 Insomnia due to other mental disorder: Secondary | ICD-10-CM

## 2022-01-04 DIAGNOSIS — E559 Vitamin D deficiency, unspecified: Secondary | ICD-10-CM | POA: Diagnosis not present

## 2022-01-04 DIAGNOSIS — R7303 Prediabetes: Secondary | ICD-10-CM

## 2022-01-04 DIAGNOSIS — F322 Major depressive disorder, single episode, severe without psychotic features: Secondary | ICD-10-CM

## 2022-01-04 DIAGNOSIS — F419 Anxiety disorder, unspecified: Secondary | ICD-10-CM

## 2022-01-04 DIAGNOSIS — J45909 Unspecified asthma, uncomplicated: Secondary | ICD-10-CM

## 2022-01-04 DIAGNOSIS — M5442 Lumbago with sciatica, left side: Secondary | ICD-10-CM

## 2022-01-04 DIAGNOSIS — I10 Essential (primary) hypertension: Secondary | ICD-10-CM

## 2022-01-04 DIAGNOSIS — F409 Phobic anxiety disorder, unspecified: Secondary | ICD-10-CM

## 2022-01-04 MED ORDER — ALBUTEROL SULFATE HFA 108 (90 BASE) MCG/ACT IN AERS: 2.0000 | INHALATION_SPRAY | Freq: Four times a day (QID) | RESPIRATORY_TRACT | 0 refills | Status: DC | PRN

## 2022-01-04 MED ORDER — BENZONATATE 100 MG PO CAPS: 100.0000 mg | ORAL_CAPSULE | Freq: Two times a day (BID) | ORAL | 0 refills | Status: DC | PRN

## 2022-01-04 MED ORDER — AZELASTINE HCL 0.1 % NA SOLN: 2.0000 | Freq: Two times a day (BID) | NASAL | 4 refills | Status: DC

## 2022-01-04 NOTE — Patient Instructions (Addendum)
F/u in 10 weeks, call if you need me sooner  Work on low salt plant based eating for improved blood pressure  Fasting CBC, lipid, cmp and EGFR, hBA1c, TSH and vit d 3 to 5 days before next visit  It is important that you exercise regularly at least 30 minutes 5 times a week. If you develop chest pain, have severe difficulty breathing, or feel very tired, stop exercising immediately and seek medical attention    Thanks for choosing [facility], we consider it a privelige to serve you.  Pain in left leg is referred from back  Thanks for choosing [facility], we consider it a privelige to serve you.

## 2022-01-07 ENCOUNTER — Ambulatory Visit: Payer: BC Managed Care – PPO | Admitting: Family Medicine

## 2022-01-09 ENCOUNTER — Encounter: Payer: Self-pay | Admitting: Family Medicine

## 2022-01-09 DIAGNOSIS — J45909 Unspecified asthma, uncomplicated: Secondary | ICD-10-CM | POA: Insufficient documentation

## 2022-01-09 MED ORDER — ALPRAZOLAM 1 MG PO TABS: 1.0000 mg | ORAL_TABLET | Freq: Every day | ORAL | 1 refills | Status: DC

## 2022-01-09 NOTE — Assessment & Plan Note (Signed)
Hyperlipidemia:Low fat diet discussed and encouraged.   Lipid Panel  Lab Results  Component Value Date   CHOL 239 (H) 02/12/2021   HDL 69 02/12/2021   LDLCALC 157 (H) 02/12/2021   TRIG 74 02/12/2021   CHOLHDL 3.5 02/12/2021     Uncontrolled when last checked Updated lab needed at/ before next visit.

## 2022-01-09 NOTE — Assessment & Plan Note (Signed)
Improved managed by psych

## 2022-01-09 NOTE — Assessment & Plan Note (Signed)
DASH diet and commitment to daily physical activity for a minimum of 30 minutes discussed and encouraged, as a part of hypertension management. The importance of attaining a healthy weight is also discussed.     01/04/2022    9:50 PM 01/04/2022    5:11 PM 01/04/2022    4:56 PM 01/04/2022    4:27 PM 12/08/2021    1:49 PM 11/23/2021   11:30 AM 10/21/2021    3:24 PM  BP/Weight  Systolic BP 286 381 771 165  790 383  Diastolic BP 84 80 84 65  84 83  Wt. (Lbs)    212.08  204 210.08  BMI    40.07 kg/m2  38.55 kg/m2 39.69 kg/m2     Information is confidential and restricted. Go to Review Flowsheets to unlock data.     Controlled, no change in medication

## 2022-01-09 NOTE — Assessment & Plan Note (Signed)
Improved , managed by Psych

## 2022-01-09 NOTE — Assessment & Plan Note (Signed)
Increased following recent fall, muscle relaxant as needed

## 2022-01-09 NOTE — Assessment & Plan Note (Signed)
Uncontrolled, commit to daily Astelin and tessalon perles prescribed for chest congestion

## 2022-01-09 NOTE — Assessment & Plan Note (Signed)
Patient educated about the importance of limiting  Carbohydrate intake , the need to commit to daily physical activity for a minimum of 30 minutes , and to commit weight loss. The fact that changes in all these areas will reduce or eliminate all together the development of diabetes is stressed.      Latest Ref Rng & Units 11/23/2021    3:18 PM 08/15/2021   11:47 AM 02/12/2021    1:22 PM 04/29/2020    3:35 PM 01/15/2019   12:20 PM  Diabetic Labs  HbA1c 4.8 - 5.6 %   5.4     Chol 100 - 199 mg/dL   239  237    HDL >39 mg/dL   69  63    Calc LDL 0 - 99 mg/dL   157  156    Triglycerides 0 - 149 mg/dL   74  101    Creatinine 0.57 - 1.00 mg/dL 1.03  0.92  0.96  0.91  0.74       01/04/2022    9:50 PM 01/04/2022    5:11 PM 01/04/2022    4:56 PM 01/04/2022    4:27 PM 12/08/2021    1:49 PM 11/23/2021   11:30 AM 10/21/2021    3:24 PM  BP/Weight  Systolic BP 909 311 216 244  695 072  Diastolic BP 84 80 84 65  84 83  Wt. (Lbs)    212.08  204 210.08  BMI    40.07 kg/m2  38.55 kg/m2 39.69 kg/m2     Information is confidential and restricted. Go to Review Flowsheets to unlock data.       No data to display          Updated lab needed at/ before next visit.

## 2022-01-09 NOTE — Assessment & Plan Note (Signed)
Sleep hygiene reviewed and written information offered also. Prescription sent for  medication needed. Controlled, no change in medication  

## 2022-01-09 NOTE — Assessment & Plan Note (Signed)
  Patient re-educated about  the importance of commitment to a  minimum of 150 minutes of exercise per week as able.  The importance of healthy food choices with portion control discussed, as well as eating regularly and within a 12 hour window most days. The need to choose "clean , green" food 50 to 75% of the time is discussed, as well as to make water the primary drink and set a goal of 64 ounces water daily.       01/04/2022    4:27 PM 12/08/2021    1:49 PM 11/23/2021   11:30 AM  Weight /BMI  Weight 212 lb 1.3 oz  204 lb  Height '5\' 1"'$  (1.549 m)  '5\' 1"'$  (1.549 m)  BMI 40.07 kg/m2  38.55 kg/m2     Information is confidential and restricted. Go to Review Flowsheets to unlock data.

## 2022-01-09 NOTE — Progress Notes (Signed)
Stephanie Cannon     MRN: [id number]      DOB: 11-10-1970   HPI Stephanie Cannon is here for follow up and re-evaluation of chronic medical conditions, medication management and review of any available recent lab and radiology data.  Preventive health is updated, specifically  Cancer screening and Immunization.   Increased back and left leg pain following a fall in November Increased allergy symptoms due to weather change, no fever or chills  ROS Denies recent fever or chills. Denies chest congestion, productive cough or wheezing. Denies chest pains, palpitations and leg swelling Denies abdominal pain, nausea, vomiting,diarrhea or constipation.   Denies dysuria, frequency, hesitancy or incontinence. Denies headaches, seizures, numbness, or tingling. Denies  uncontrolled depression, anxiety or insomnia. Denies skin break down or rash.   PE  BP 138/84   Pulse 80   Ht '5\' 1"'$  (1.549 m)   Wt 212 lb 1.3 oz (96.2 kg)   SpO2 98%   BMI 40.07 kg/m    Patient alert and oriented and in no cardiopulmonary distress.  HEENT: No facial asymmetry, EOMI,     Neck supple .No sinus tenderness  Chest: Clear to auscultation bilaterally.  CVS: S1, S2 no murmurs, no S3.Regular rate.  ABD: Soft non tender.   Ext: No edema  MS: Adequate though reduced  ROM spine,normal in  shoulders, hips and knees.  Skin: Intact, no ulcerations or rash noted.  Psych: Good eye contact, normal affect. Memory intact not anxious or depressed appearing.  CNS: CN 2-12 intact, power,  normal throughout.no focal deficits noted.   Assessment & Plan  Essential hypertension DASH diet and commitment to daily physical activity for a minimum of 30 minutes discussed and encouraged, as a part of hypertension management. The importance of attaining a healthy weight is also discussed.     01/04/2022    9:50 PM 01/04/2022    5:11 PM 01/04/2022    4:56 PM 01/04/2022    4:27 PM 12/08/2021    1:49 PM 11/23/2021   11:30 AM  10/21/2021    3:24 PM  BP/Weight  Systolic BP 676 195 093 267  124 580  Diastolic BP 84 80 84 65  84 83  Wt. (Lbs)    212.08  204 210.08  BMI    40.07 kg/m2  38.55 kg/m2 39.69 kg/m2     Information is confidential and restricted. Go to Review Flowsheets to unlock data.     Controlled, no change in medication   Low back pain with left-sided sciatica Increased following recent fall, muscle relaxant as needed  Prediabetes Patient educated about the importance of limiting  Carbohydrate intake , the need to commit to daily physical activity for a minimum of 30 minutes , and to commit weight loss. The fact that changes in all these areas will reduce or eliminate all together the development of diabetes is stressed.      Latest Ref Rng & Units 11/23/2021    3:18 PM 08/15/2021   11:47 AM 02/12/2021    1:22 PM 04/29/2020    3:35 PM 01/15/2019   12:20 PM  Diabetic Labs  HbA1c 4.8 - 5.6 %   5.4     Chol 100 - 199 mg/dL   239  237    HDL >39 mg/dL   69  63    Calc LDL 0 - 99 mg/dL   157  156    Triglycerides 0 - 149 mg/dL   74  101  Creatinine 0.57 - 1.00 mg/dL 1.03  0.92  0.96  0.91  0.74       01/04/2022    9:50 PM 01/04/2022    5:11 PM 01/04/2022    4:56 PM 01/04/2022    4:27 PM 12/08/2021    1:49 PM 11/23/2021   11:30 AM 10/21/2021    3:24 PM  BP/Weight  Systolic BP 093 818 299 371  696 789  Diastolic BP 84 80 84 65  84 83  Wt. (Lbs)    212.08  204 210.08  BMI    40.07 kg/m2  38.55 kg/m2 39.69 kg/m2     Information is confidential and restricted. Go to Review Flowsheets to unlock data.       No data to display          Updated lab needed at/ before next visit.   Vitamin D deficiency Updated lab needed at/ before next visit.   Hyperlipidemia LDL goal <100 Hyperlipidemia:Low fat diet discussed and encouraged.   Lipid Panel  Lab Results  Component Value Date   CHOL 239 (H) 02/12/2021   HDL 69 02/12/2021   LDLCALC 157 (H) 02/12/2021   TRIG 74 02/12/2021    CHOLHDL 3.5 02/12/2021     Uncontrolled when last checked Updated lab needed at/ before next visit.   Depression, major, single episode, severe ([place]) Improved , managed by Psych  Anxiety Improved managed by psych  Insomnia due to anxiety and fear Sleep hygiene reviewed and written information offered also. Prescription sent for  medication needed. Controlled, no change in medication   Asthma due to environmental allergies Uncontrolled, commit to daily Astelin and tessalon perles prescribed for chest congestion  Morbid obesity ([place])  Patient re-educated about  the importance of commitment to a  minimum of 150 minutes of exercise per week as able.  The importance of healthy food choices with portion control discussed, as well as eating regularly and within a 12 hour window most days. The need to choose "clean , green" food 50 to 75% of the time is discussed, as well as to make water the primary drink and set a goal of 64 ounces water daily.       01/04/2022    4:27 PM 12/08/2021    1:49 PM 11/23/2021   11:30 AM  Weight /BMI  Weight 212 lb 1.3 oz  204 lb  Height '5\' 1"'$  (1.549 m)  '5\' 1"'$  (1.549 m)  BMI 40.07 kg/m2  38.55 kg/m2     Information is confidential and restricted. Go to Review Flowsheets to unlock data.

## 2022-01-09 NOTE — Assessment & Plan Note (Signed)
Updated lab needed at/ before next visit.   

## 2022-01-27 ENCOUNTER — Telehealth: Payer: Self-pay | Admitting: Family Medicine

## 2022-01-27 NOTE — Telephone Encounter (Signed)
FMLA   Noated  Copied  Sleeved   In provider bx Copy in brown folder

## 2022-01-29 ENCOUNTER — Other Ambulatory Visit: Payer: Self-pay | Admitting: Family Medicine

## 2022-01-31 MED ORDER — TRIAMTERENE-HCTZ 75-50 MG PO TABS: 1.0000 | ORAL_TABLET | Freq: Every day | ORAL | 3 refills | Status: DC

## 2022-01-31 NOTE — Addendum Note (Signed)
Addended by: [name] E on: 01/31/2022 11:30 AM   Modules accepted: Orders

## 2022-02-03 ENCOUNTER — Telehealth: Payer: Self-pay | Admitting: Family Medicine

## 2022-02-03 NOTE — Telephone Encounter (Signed)
Patient called for Brandi checking the status of a prior autho for Alprazolam '1mg'$ .  [employer].  Pt's # [phone]

## 2022-02-04 NOTE — Telephone Encounter (Signed)
Awaiting pa from cover my meds

## 2022-02-05 ENCOUNTER — Other Ambulatory Visit: Payer: Self-pay | Admitting: Family Medicine

## 2022-02-05 DIAGNOSIS — J45909 Unspecified asthma, uncomplicated: Secondary | ICD-10-CM

## 2022-02-06 ENCOUNTER — Other Ambulatory Visit: Payer: Self-pay | Admitting: Family Medicine

## 2022-02-08 ENCOUNTER — Encounter: Payer: Self-pay | Admitting: Family Medicine

## 2022-02-08 DIAGNOSIS — Z0279 Encounter for issue of other medical certificate: Secondary | ICD-10-CM

## 2022-02-09 NOTE — Telephone Encounter (Signed)
Pwk faxed/sent to scan/pt aware ready for pick up

## 2022-02-11 ENCOUNTER — Telehealth: Payer: Self-pay | Admitting: Family Medicine

## 2022-02-11 NOTE — Telephone Encounter (Signed)
FMLA must be turn in by 02.19.2024   Needs additional information   Copied Noted Sleeved

## 2022-02-11 NOTE — Telephone Encounter (Signed)
Received [employer]ML[employer] needs additonal information. Due back by 02.19.2024 fax # [phone]  Copied Noted sleeved

## 2022-02-13 ENCOUNTER — Other Ambulatory Visit: Payer: Self-pay | Admitting: Family Medicine

## 2022-02-15 DIAGNOSIS — Z0279 Encounter for issue of other medical certificate: Secondary | ICD-10-CM

## 2022-02-17 NOTE — Telephone Encounter (Signed)
Called patient, parents will pick up forms, forms were faxed to [phone].

## 2022-02-17 NOTE — Telephone Encounter (Signed)
Forms picked up

## 2022-03-09 ENCOUNTER — Encounter (HOSPITAL_COMMUNITY): Payer: Self-pay | Admitting: Psychiatry

## 2022-03-09 ENCOUNTER — Telehealth (HOSPITAL_COMMUNITY): Payer: BC Managed Care – PPO | Admitting: Psychiatry

## 2022-03-09 DIAGNOSIS — F411 Generalized anxiety disorder: Secondary | ICD-10-CM

## 2022-03-09 DIAGNOSIS — F39 Unspecified mood [affective] disorder: Secondary | ICD-10-CM

## 2022-03-09 MED ORDER — LAMOTRIGINE 150 MG PO TABS: ORAL_TABLET | ORAL | 2 refills | Status: DC

## 2022-03-09 NOTE — Progress Notes (Signed)
[facility] MD Virtual Progress Note   Patient Location: Home Provider Location: Home Office  I connect with patient by video and verified that I am speaking with correct person by using two identifiers. I discussed the limitations of evaluation and management by telemedicine and the availability of in person appointments. I also discussed with the patient that there may be a patient responsible charge related to this service. The patient expressed understanding and agreed to proceed.  Stephanie Cannon [id number] 52 y.o.  03/09/2022 3:09 PM  History of Present Illness:  Patient is evaluated by video session.  She is taking Lamictal 100 mg daily and so far no major side effects.  She has no rash, itching or tremors.  She still struggle with sleep and there are times when she only sleep 4 to 5 hours.  Her job is stressful and challenging but manageable.  Lately she is more nervous anxious and dysphoric because of the family.  One of her cousin diagnosed with rheumatoid arthritis and not doing well and she is also concerned about her father due to chronic health issues.  She tried to talk to her father 3-4 times per week.  She is trying to focus on her health and able to lost 3 pounds since the last visit.  Patient lives with her husband and they have a 2 year old son who lives in [place].  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  Patient taking Cymbalta prescribed by neurology and trazodone and Xanax from Dr. Moshe Cipro.  She still have some anxiety going to public places but denies any crying spells or any feeling of hopelessness or worthlessness.  Her appetite is okay.  Past Psychiatric History: No h/o inpatient treatment or suicidal attempt.  Saw Dr. Harrington Challenger twice but no further follow-ups.  Saw therapist but reported to help her son. Tried Prozac temazepam, Ambien, melatonin and recently BuSpar. No history of psychosis.    Outpatient Encounter Medications as of  03/09/2022  Medication Sig   acetic acid-hydrocortisone (VOSOL-HC) OTIC solution PLACE 5 DROPS INTO AFFECTED EAR TWICE DAILY.   albuterol (VENTOLIN HFA) 108 (90 Base) MCG/ACT inhaler INHALE 2 PUFFS INTO THE LUNGS EVERY 6 HOURS AS NEEDED FOR WHEEZING OR SHORTNESS OF BREATH   ALPRAZolam (XANAX) 1 MG tablet Take 1 tablet (1 mg total) by mouth at bedtime.   azelastine (ASTELIN) 0.1 % nasal spray Place 2 sprays into both nostrils 2 (two) times daily. Use in each nostril as directed   benzonatate (TESSALON) 100 MG capsule Take 1 capsule (100 mg total) by mouth 2 (two) times daily as needed for cough.   cyclobenzaprine (FLEXERIL) 10 MG tablet TAKE 1 TABLET BY MOUTH AT BEDTIME.   DULoxetine (CYMBALTA) 60 MG capsule Take 1 capsule (60 mg total) by mouth daily.   ergocalciferol (VITAMIN D2) 1.25 MG (50000 UT) capsule Take 1 capsule (50,000 Units total) by mouth once a week. One capsule once weekly   esomeprazole (NEXIUM) 40 MG capsule TAKE ONE CAPSULE BY MOUTH ONCE DAILY.   lamoTRIgine (LAMICTAL) 100 MG tablet Take 1 tab daily   Multiple Vitamins-Minerals (MULTIVITAMIN GUMMIES ADULT PO) Take 1 each by mouth 2 (two) times daily.   ondansetron (ZOFRAN) 4 MG tablet Take 1 tablet (4 mg total) by mouth every 8 (eight) hours as needed for nausea or vomiting. TAKE (1) TABLET BY MOUTH EVERY EIGHT HOURS AS NEEDED FOR NAUSEA/VOMITING. Strength: 4 mg   potassium chloride (KLOR-CON) 10 MEQ tablet TAKE ONE TABLET BY MOUTH ONCE  DAILY.   traZODone (DESYREL) 100 MG tablet TAKE (1) TABLET BY MOUTH AT BEDTIME.   triamterene-hydrochlorothiazide (MAXZIDE) 75-50 MG tablet Take 1 tablet by mouth daily.   No facility-administered encounter medications on file as of 03/09/2022.    No results found for this or any previous visit (from the past 2160 hour(s)).   Psychiatric Specialty Exam: Physical Exam  Review of Systems  Weight 204 lb (92.5 kg).There is no height or weight on file to calculate BMI.  General Appearance: Casual   Eye Contact:  Good  Speech:  Clear and Coherent and Normal Rate  Volume:  Normal  Mood:  Anxious, Depressed, and Dysphoric  Affect:  Congruent  Thought Process:  Goal Directed  Orientation:  Full (Time, Place, and Person)  Thought Content:  Rumination  Suicidal Thoughts:  No  Homicidal Thoughts:  No  Memory:  Immediate;   Good Recent;   Good Remote;   Good  Judgement:  Intact  Insight:  Present  Psychomotor Activity:  Normal  Concentration:  Concentration: Good and Attention Span: Good  Recall:  Good  Fund of Knowledge:  Good  Language:  Good  Akathisia:  No  Handed:  Right  AIMS (if indicated):     Assets:  Communication Skills Desire for Improvement Housing Social Support Talents/Skills Transportation  ADL's:  Intact  Cognition:  WNL  Sleep:  4-5 hrs     Assessment/Plan: Mood disorder (HCC) - Plan: lamoTRIgine (LAMICTAL) 150 MG tablet  GAD (generalized anxiety disorder) - Plan: lamoTRIgine (LAMICTAL) 150 MG tablet  Discussed current medication and residual insomnia and mood lability.  Recommend to try Lamictal 150 mg daily.  Discussed stress about her family as father not doing very well and cousin has health issues.  Offered therapy and patient agreed to see a therapist in our office but like to have a virtual therapist.  I also discussed if she can try higher dose of trazodone at bedtime to help her sleep.  Her trazodone is given by PCP and she has upcoming appointment soon.  Her Xanax and trazodone is given by her PCP.  Discussed medication side effect specially higher dose of Lamictal and she need to watch carefully if she has not any rash then she need to stop the medication immediately.  We will follow-up in 3 months.   Follow Up Instructions:     I discussed the assessment and treatment plan with the patient. The patient was provided an opportunity to ask questions and all were answered. The patient agreed with the plan and demonstrated an understanding of the  instructions.   The patient was advised to call back or seek an in-person evaluation if the symptoms worsen or if the condition fails to improve as anticipated.    Collaboration of Care: Other provider involved in patient's care AEB notes are available in epic to review.  Patient/Guardian was advised Release of Information must be obtained prior to any record release in order to collaborate their care with an outside provider. Patient/Guardian was advised if they have not already done so to contact the registration department to sign all necessary forms in order for [place] to release information regarding their care.   Consent: Patient/Guardian gives verbal consent for treatment and assignment of benefits for services provided during this visit. Patient/Guardian expressed understanding and agreed to proceed.     I provided 18 minutes of non face to face time during this encounter.  Kathlee Nations, MD 03/09/2022

## 2022-03-17 ENCOUNTER — Ambulatory Visit: Payer: BC Managed Care – PPO | Admitting: Family Medicine

## 2022-03-19 ENCOUNTER — Other Ambulatory Visit: Payer: Self-pay | Admitting: Family Medicine

## 2022-03-19 DIAGNOSIS — M62830 Muscle spasm of back: Secondary | ICD-10-CM

## 2022-03-25 ENCOUNTER — Telehealth: Payer: Self-pay | Admitting: Family Medicine

## 2022-03-25 NOTE — Telephone Encounter (Signed)
FMLA   Noted  Copied  Sleeved   Original in blue folder front desk Copy in brown folder front desk  Pt appt 4/25

## 2022-04-04 ENCOUNTER — Telehealth: Payer: Self-pay | Admitting: Neurology

## 2022-04-04 ENCOUNTER — Other Ambulatory Visit: Payer: Self-pay

## 2022-04-04 MED ORDER — DULOXETINE HCL 60 MG PO CPEP: 60.0000 mg | ORAL_CAPSULE | Freq: Every day | ORAL | 0 refills | Status: DC

## 2022-04-04 NOTE — Telephone Encounter (Signed)
Pt is requesting a 90 day supply of DULoxetine (CYMBALTA) 60 MG capsule [id number] if possible

## 2022-04-14 ENCOUNTER — Encounter: Payer: Self-pay | Admitting: Family Medicine

## 2022-04-15 MED ORDER — ALBUTEROL SULFATE HFA 108 (90 BASE) MCG/ACT IN AERS: 2.0000 | INHALATION_SPRAY | Freq: Four times a day (QID) | RESPIRATORY_TRACT | 0 refills | Status: DC | PRN

## 2022-04-18 ENCOUNTER — Other Ambulatory Visit: Payer: Self-pay | Admitting: Family Medicine

## 2022-04-19 ENCOUNTER — Ambulatory Visit: Payer: BC Managed Care – PPO | Admitting: Adult Health

## 2022-04-20 ENCOUNTER — Encounter: Payer: Self-pay | Admitting: Family Medicine

## 2022-04-21 ENCOUNTER — Ambulatory Visit (INDEPENDENT_AMBULATORY_CARE_PROVIDER_SITE_OTHER): Payer: BC Managed Care – PPO | Admitting: Family Medicine

## 2022-04-21 ENCOUNTER — Ambulatory Visit: Payer: BC Managed Care – PPO | Admitting: Family Medicine

## 2022-04-21 ENCOUNTER — Ambulatory Visit (HOSPITAL_COMMUNITY)
Admission: RE | Admit: 2022-04-21 | Discharge: 2022-04-21 | Disposition: A | Discharge status: Home / Self Care | Payer: BC Managed Care – PPO | Source: Ambulatory Visit | Attending: Family Medicine | Admitting: Family Medicine

## 2022-04-21 ENCOUNTER — Encounter: Payer: Self-pay | Admitting: Family Medicine

## 2022-04-21 VITALS — BP 134/81 | HR 94 | Temp 97.7°F | Ht 61.0 in | Wt 202.0 lb

## 2022-04-21 DIAGNOSIS — G8929 Other chronic pain: Secondary | ICD-10-CM | POA: Diagnosis present

## 2022-04-21 DIAGNOSIS — M545 Low back pain, unspecified: Secondary | ICD-10-CM

## 2022-04-21 DIAGNOSIS — M5410 Radiculopathy, site unspecified: Secondary | ICD-10-CM | POA: Diagnosis not present

## 2022-04-21 MED ORDER — MUPIROCIN 2 % EX OINT: 1.0000 | TOPICAL_OINTMENT | Freq: Two times a day (BID) | CUTANEOUS | 0 refills | Status: AC

## 2022-04-21 NOTE — Assessment & Plan Note (Signed)
Continue taking Flexeril 10 mg and Cymbalta 60 mg Xray ordered- Awaiting results will follow up Possible referral to physical therapy Discussed medication desired effects, potential side effects. Non pharmacological interventions include rest, avoid twisting, improper bending, straining lower back. Demonstration of proper body mechanics. Alternate ice and heat. Recommend stretching back and legs. Follow up for worsening or persistent symptoms. Patient verbalizes understanding regarding plan of care and all questions answered.

## 2022-04-21 NOTE — Patient Instructions (Signed)
It was pleasure meeting with you today. Please take medications as prescribed. Follow up with your primary health provider if any health concerns arises. If symptoms worsen please contact your primary care provider and/or visit the emergency department.  

## 2022-04-21 NOTE — Progress Notes (Signed)
Patient Office Visit   Subjective   Patient ID: Stephanie Cannon, female    DOB: [birth year]  Age: 52 y.o. MRN: [id number]  CC:  Chief Complaint  Patient presents with   Back Pain    Pt c/o chronic lower back pain that exacerbated in the last 2 weeks, pt has tried Flexeril, 800 mg Motrin with mild relief; Pt reports problem affect ADL with stiffness and currently at 7/10; pt had bronchitis last week and coughing worsen pain greatly;     HPI Stephanie Cannon 52 year old female, presents to clinic for chronic back pain She  has a past medical history of Arthritis, Asthma, Breast discharge, Carpal tunnel syndrome, Chronic back pain, GERD (gastroesophageal reflux disease), Hyperlipidemia, Hypertension, and Obesity.  Back Pain This is a chronic problem. Patient reports back pain occurs constantly and has been gradually worsening since onset. The pain is present in the lumbar spine. The quality of the pain is described as aching, cramping and stabbing. The pain radiates to the left thigh. The pain is at a severity of 8/10. The pain is worse during the day. The symptoms are aggravated by standing, position, sitting and bending. Stiffness is present all day. Associated symptoms include leg pain, numbness and weakness. Pertinent negatives include no bladder incontinence, bowel incontinence, dysuria, fever, paresis, paresthesias, pelvic pain, perianal numbness or tingling. Risk factors include obesity, sedentary lifestyle and lack of exercise. She has tried NSAIDs and muscle relaxant for the symptoms. The treatment provided mild relief.       Outpatient Encounter Medications as of 04/21/2022  Medication Sig   albuterol (VENTOLIN HFA) 108 (90 Base) MCG/ACT inhaler INHALE 2 PUFFS INTO THE LUNGS EVERY 6 HOURS AS NEEDED FOR WHEEZING OR SHORTNESS OF BREATH   albuterol (VENTOLIN HFA) 108 (90 Base) MCG/ACT inhaler Inhale 2 puffs into the lungs every 6 (six) hours as needed for wheezing or shortness of  breath.   ALPRAZolam (XANAX) 1 MG tablet Take 1 tablet (1 mg total) by mouth at bedtime.   azelastine (ASTELIN) 0.1 % nasal spray Place 2 sprays into both nostrils 2 (two) times daily. Use in each nostril as directed   benzonatate (TESSALON) 100 MG capsule Take 1 capsule (100 mg total) by mouth 2 (two) times daily as needed for cough.   cyclobenzaprine (FLEXERIL) 10 MG tablet TAKE 1 TABLET BY MOUTH AT BEDTIME.   DULoxetine (CYMBALTA) 60 MG capsule Take 1 capsule (60 mg total) by mouth daily.   ergocalciferol (VITAMIN D2) 1.25 MG (50000 UT) capsule Take 1 capsule (50,000 Units total) by mouth once a week. One capsule once weekly   esomeprazole (NEXIUM) 40 MG capsule TAKE ONE CAPSULE BY MOUTH ONCE DAILY.   lamoTRIgine (LAMICTAL) 150 MG tablet Take 1 tab daily   Multiple Vitamins-Minerals (MULTIVITAMIN GUMMIES ADULT PO) Take 1 each by mouth 2 (two) times daily.   mupirocin ointment (BACTROBAN) 2 % Apply 1 Application topically 2 (two) times daily for 7 days.   ondansetron (ZOFRAN) 4 MG tablet Take 1 tablet (4 mg total) by mouth every 8 (eight) hours as needed for nausea or vomiting. TAKE (1) TABLET BY MOUTH EVERY EIGHT HOURS AS NEEDED FOR NAUSEA/VOMITING. Strength: 4 mg   potassium chloride (KLOR-CON) 10 MEQ tablet TAKE ONE TABLET BY MOUTH ONCE DAILY.   traZODone (DESYREL) 100 MG tablet TAKE (1) TABLET BY MOUTH AT BEDTIME.   triamterene-hydrochlorothiazide (MAXZIDE) 75-50 MG tablet Take 1 tablet by mouth daily.   [DISCONTINUED] acetic acid-hydrocortisone (VOSOL-HC) OTIC solution  PLACE 5 DROPS INTO AFFECTED EAR TWICE DAILY.   No facility-administered encounter medications on file as of 04/21/2022.    Past Surgical History:  Procedure Laterality Date   ABDOMINAL HYSTERECTOMY  2003   partial   BREAST BIOPSY Left 2018   benign   CARPAL TUNNEL RELEASE Right    CESAREAN SECTION  1988   CESAREAN SECTION N/A    [employer] 07/31/2019   CHOLECYSTECTOMY     COLONOSCOPY N/A 09/21/2012   Procedure:  COLONOSCOPY;  Surgeon: West Bali, MD;  Location: [facility];  Service: Endoscopy;  Laterality: N/A;  9:30   LAPAROSCOPIC GASTRIC SLEEVE RESECTION WITH HIATAL HERNIA REPAIR N/A 06/23/2014   Procedure: LAPAROSCOPIC LYSIS OF ADHESIONS, GASTRIC SLEEVE RESECTION AND UPPER ENDO;  Surgeon: Ovidio Kin, MD;  Location: [facility];  Service: General;  Laterality: N/A;   LAPAROSCOPIC LYSIS OF ADHESIONS  06/23/2014   Procedure: LAPAROSCOPIC LYSIS OF ADHESIONS;  Surgeon: Ovidio Kin, MD;  Location: [facility];  Service: General;;   LUMBAR FUSION  01/22/2014   L4 L5       DR Lovell Sheehan   other     Vocal chord polyp removal   ovarian tumor  2005   ovaries removed    SPINE SURGERY N/A    [employer] 07/31/2019   TONSILLECTOMY     UPPER GI ENDOSCOPY  06/23/2014   Procedure: UPPER GI ENDOSCOPY;  Surgeon: Ovidio Kin, MD;  Location: [facility];  Service: General;;    Review of Systems  Constitutional:  Negative for chills and fever.  Respiratory:  Negative for shortness of breath.   Cardiovascular:  Negative for chest pain.  Gastrointestinal:  Negative for abdominal pain, nausea and vomiting.  Genitourinary:  Negative for dysuria.  Musculoskeletal:  Positive for back pain and myalgias. Negative for joint pain.  Neurological:  Negative for dizziness and headaches.      Objective    BP 134/81   Pulse 94   Temp 97.7 F (36.5 C) (Oral)   Ht  (1.549 m)   Wt 202 lb (91.6 kg)   SpO2 94%   BMI 38.17 kg/m   Physical Exam Vitals reviewed.  Constitutional:      General: She is not in acute distress.    Appearance: Normal appearance. She is not ill-appearing, toxic-appearing or diaphoretic.  HENT:     Head: Normocephalic.  Eyes:     General:        Right eye: No discharge.        Left eye: No discharge.     Conjunctiva/sclera: Conjunctivae normal.  Cardiovascular:     Rate and Rhythm: Normal rate.     Pulses: Normal pulses.     Heart sounds: Normal heart sounds.  Pulmonary:     Effort: Pulmonary  effort is normal. No respiratory distress.     Breath sounds: Normal breath sounds.  Abdominal:     General: Bowel sounds are normal.     Palpations: Abdomen is soft.     Tenderness: There is no abdominal tenderness. There is no right CVA tenderness, left CVA tenderness or guarding.  Musculoskeletal:        General: Normal range of motion.     Cervical back: Normal range of motion.     Lumbar back: Negative right straight leg raise test and negative left straight leg raise test.  Skin:    General: Skin is warm and dry.     Capillary Refill: Capillary refill takes less than 2 seconds.  Neurological:     General: No focal deficit present.     Mental Status: She is alert and oriented to person, place, and time.     Coordination: Coordination normal.     Gait: Gait normal.  Psychiatric:        Mood and Affect: Mood normal.        Behavior: Behavior normal.        Thought Content: Thought content normal.        Judgment: Judgment normal.       Assessment & Plan:  Chronic bilateral low back pain, unspecified whether sciatica present -     DG Lumbar Spine 2-3 Views; Future  Back pain with left-sided radiculopathy Assessment & Plan: Continue taking Flexeril 10 mg and Cymbalta 60 mg Xray ordered- Awaiting results will follow up Possible referral to physical therapy Discussed medication desired effects, potential side effects. Non pharmacological interventions include rest, avoid twisting, improper bending, straining lower back. Demonstration of proper body mechanics. Alternate ice and heat. Recommend stretching back and legs. Follow up for worsening or persistent symptoms. Patient verbalizes understanding regarding plan of care and all questions answered.    Other orders -     Mupirocin; Apply 1 Application topically 2 (two) times daily for 7 days.  Dispense: 14 g; Refill: 0    No follow-ups on file.   Cruzita Lederer Newman Nip, FNP

## 2022-04-21 NOTE — Telephone Encounter (Signed)
Has appt today

## 2022-04-25 ENCOUNTER — Other Ambulatory Visit: Payer: Self-pay | Admitting: Family Medicine

## 2022-04-25 DIAGNOSIS — G8929 Other chronic pain: Secondary | ICD-10-CM

## 2022-04-28 ENCOUNTER — Encounter: Payer: Self-pay | Admitting: Family Medicine

## 2022-04-28 ENCOUNTER — Ambulatory Visit (INDEPENDENT_AMBULATORY_CARE_PROVIDER_SITE_OTHER): Payer: BC Managed Care – PPO | Admitting: Family Medicine

## 2022-04-28 VITALS — BP 120/83 | HR 93 | Ht 61.0 in | Wt 209.1 lb

## 2022-04-28 DIAGNOSIS — R7303 Prediabetes: Secondary | ICD-10-CM | POA: Diagnosis not present

## 2022-04-28 DIAGNOSIS — F409 Phobic anxiety disorder, unspecified: Secondary | ICD-10-CM

## 2022-04-28 DIAGNOSIS — I10 Essential (primary) hypertension: Secondary | ICD-10-CM

## 2022-04-28 DIAGNOSIS — F419 Anxiety disorder, unspecified: Secondary | ICD-10-CM

## 2022-04-28 DIAGNOSIS — F322 Major depressive disorder, single episode, severe without psychotic features: Secondary | ICD-10-CM

## 2022-04-28 DIAGNOSIS — F5105 Insomnia due to other mental disorder: Secondary | ICD-10-CM

## 2022-04-28 DIAGNOSIS — Z0289 Encounter for other administrative examinations: Secondary | ICD-10-CM

## 2022-04-28 DIAGNOSIS — E559 Vitamin D deficiency, unspecified: Secondary | ICD-10-CM

## 2022-04-28 DIAGNOSIS — M5410 Radiculopathy, site unspecified: Secondary | ICD-10-CM

## 2022-04-28 DIAGNOSIS — E785 Hyperlipidemia, unspecified: Secondary | ICD-10-CM | POA: Diagnosis not present
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
## 2022-04-29 ENCOUNTER — Telehealth: Payer: Self-pay | Admitting: Family Medicine

## 2022-04-29 NOTE — Telephone Encounter (Signed)
Fmla   Noted  Sleeved  Copied   In provider box Copy front desk (copy Folder)

## 2022-05-01 ENCOUNTER — Encounter: Payer: Self-pay | Admitting: Family Medicine

## 2022-05-01 DIAGNOSIS — Z0289 Encounter for other administrative examinations: Secondary | ICD-10-CM | POA: Insufficient documentation

## 2022-05-01 NOTE — Assessment & Plan Note (Signed)
Recent flare has improved

## 2022-05-01 NOTE — Assessment & Plan Note (Signed)
Updated lab needed at/ before next visit.   

## 2022-05-01 NOTE — Assessment & Plan Note (Signed)
Uncontrolled, managed by Psych will message on current score

## 2022-05-01 NOTE — Assessment & Plan Note (Signed)
Hyperlipidemia:Low fat diet discussed and encouraged.   Lipid Panel  Lab Results  Component Value Date   CHOL 239 (H) 02/12/2021   HDL 69 02/12/2021   LDLCALC 157 (H) 02/12/2021   TRIG 74 02/12/2021   CHOLHDL 3.5 02/12/2021     Updated lab needed is past due , do asap.

## 2022-05-01 NOTE — Assessment & Plan Note (Signed)
Controlled, no change in medication DASH diet and commitment to daily physical activity for a minimum of 30 minutes discussed and encouraged, as a part of hypertension management. The importance of attaining a healthy weight is also discussed.     04/28/2022    4:05 PM 04/21/2022    8:48 AM 03/09/2022    3:20 PM 01/04/2022    9:50 PM 01/04/2022    5:11 PM 01/04/2022    4:56 PM 01/04/2022    4:27 PM  BP/Weight  Systolic BP 120 134  138 142 138 100  Diastolic BP 83 81  84 80 84 65  Wt. (Lbs) 209.12 202     212.08  BMI 39.51 kg/m2 38.17 kg/m2     40.07 kg/m2     Information is confidential and restricted. Go to Review Flowsheets to unlock data.

## 2022-05-01 NOTE — Assessment & Plan Note (Signed)
Has 3 separate FMLA forms which need to be done annually for her job, they are FMLA forms one for each parent and one for herself. Nothing has changed as far as chronic conditions and her need to [facility] . Forms will be completed and returned within 1 week

## 2022-05-01 NOTE — Assessment & Plan Note (Signed)
Updated lab needed at/ before next visit. Patient educated about the importance of limiting  Carbohydrate intake , the need to commit to daily physical activity for a minimum of 30 minutes , and to commit weight loss. The fact that changes in all these areas will reduce or eliminate all together the development of diabetes is stressed.      Latest Ref Rng & Units 11/23/2021    3:18 PM 08/15/2021   11:47 AM 02/12/2021    1:22 PM 04/29/2020    3:35 PM 01/15/2019   12:20 PM  Diabetic Labs  HbA1c 4.8 - 5.6 %   5.4     Chol 100 - 199 mg/dL   161  096    HDL >04 mg/dL   69  63    Calc LDL 0 - 99 mg/dL   540  981    Triglycerides 0 - 149 mg/dL   74  191    Creatinine 0.57 - 1.00 mg/dL 4.78  2.95  6.21  3.08  0.74       04/28/2022    4:05 PM 04/21/2022    8:48 AM 03/09/2022    3:20 PM 01/04/2022    9:50 PM 01/04/2022    5:11 PM 01/04/2022    4:56 PM 01/04/2022    4:27 PM  BP/Weight  Systolic BP 120 134  138 142 138 100  Diastolic BP 83 81  84 80 84 65  Wt. (Lbs) 209.12 202     212.08  BMI 39.51 kg/m2 38.17 kg/m2     40.07 kg/m2     Information is confidential and restricted. Go to Review Flowsheets to unlock data.       No data to display

## 2022-05-01 NOTE — Telephone Encounter (Signed)
Pls copy from prior FMLA forms of 2023, same info as has not changed, I will sign thanks Forms are for [employer] and [employer]

## 2022-05-01 NOTE — Assessment & Plan Note (Signed)
Uncontrolled, managed by Psych, wil;l message in current score , not suicidal or homicidal

## 2022-05-01 NOTE — Assessment & Plan Note (Signed)
Sleep hygiene reviewed and written information offered also. Prescription sent for  medication needed.  

## 2022-05-02 ENCOUNTER — Encounter: Payer: Self-pay | Admitting: Family Medicine

## 2022-05-04 ENCOUNTER — Encounter: Payer: Self-pay | Admitting: Family Medicine

## 2022-05-04 DIAGNOSIS — Z0279 Encounter for issue of other medical certificate: Secondary | ICD-10-CM

## 2022-05-04 MED ORDER — IBUPROFEN 800 MG PO TABS
800.0000 mg | ORAL_TABLET | Freq: Three times a day (TID) | ORAL | 1 refills | Status: AC | PRN
Start: 2022-05-04 — End: ?

## 2022-05-10 ENCOUNTER — Ambulatory Visit (INDEPENDENT_AMBULATORY_CARE_PROVIDER_SITE_OTHER): Payer: BC Managed Care – PPO | Admitting: Neurology

## 2022-05-10 ENCOUNTER — Encounter: Payer: Self-pay | Admitting: Family Medicine

## 2022-05-10 ENCOUNTER — Encounter: Payer: Self-pay | Admitting: Neurology

## 2022-05-10 VITALS — BP 137/83 | HR 73 | Ht 61.0 in | Wt 204.0 lb

## 2022-05-10 DIAGNOSIS — R202 Paresthesia of skin: Secondary | ICD-10-CM

## 2022-05-10 DIAGNOSIS — G5712 Meralgia paresthetica, left lower limb: Secondary | ICD-10-CM

## 2022-05-10 MED ORDER — DULOXETINE HCL 60 MG PO CPEP: 60.0000 mg | ORAL_CAPSULE | Freq: Every day | ORAL | 3 refills | Status: AC

## 2022-05-10 NOTE — Progress Notes (Signed)
Chief Complaint  Patient presents with   Follow-up    Rm 14. Alone. Continues to have paresthesia symptoms. She continues to have neck and shoulder pain. Requesting a 90 day supply of all medications.      ASSESSMENT AND PLAN  Stephanie Cannon is a 52 y.o. female  Left thigh numbness,  Most consistent with left meralgia paresthetica  Refill her Cymbalta 60 mg daily  Gait abnormality, worsening urinary urgency,  Likely due to combination of deconditioning, obesity,  MRI of cervical spine showed no evidence of cord compression,  History of lumbar decompression surgery,     DIAGNOSTIC DATA (LABS, IMAGING, TESTING) - I reviewed patient records, labs, notes, testing and imaging myself where available.  MEDICAL HISTORY:  Stephanie Cannon is a 52 year old female, seen in request by her primary care physician Dr. Lodema Hong, Milus Mallick, for evaluation of left thigh numbness, initial evaluation in October 12, 2021  I reviewed and summarized the referring note. PMHX Depression, Anxiety Chronic insomnia GERD HTN Right CTS release, did help Obesity Gastric bypass in 2016, lost 100 LB Lumbar decompression surgery in 2016 for severe low back pain, left radiculopathy, Dr. Lovell Sheehan  She has a history of lumbar decompression surgery in 2016 for severe low back pain, left lower extremity radiating pain, surgery did help her symptoms, she worked best for job, sitting for prolonged hours but still had intermittent low back pain, especially with prolonged sitting  Since February 2023, she noticed left lateral thigh area numbness, burning, tingly sensation, been persistent since its onset  She has a long history of urinary urgency, no worsening, no gait abnormality, no bower incontinence  UPDATE May 7th 2024: She complains of lateral thigh area numbness, crawling sensation, no significant pain, despite previous lumbar decompression surgery, she complains of constant midline low back deep achy  pain, denies radiating pain, she works at a desk job,  Since starting Cymbalta, her neck pain, body achy pain has improved, tolerating it well, needing refill  MRI of the cervical without contrast report from [facility] November 16, 2021, abnormal tissue in the right side of the nasopharynx, recommend ENT evaluation and direct visualization to exclude tumor, Arnold-Chiari malformation, cerebellar tonsillar extent 8 mm below foramen magnum, slightly pointed, appears to have some crowding of the foramen magnum,  She was seen by ENT, no significant abnormality found  PHYSICAL EXAM:   Vitals:   05/10/22 1527  BP: 137/83  Pulse: 73  Weight: 204 lb (92.5 kg)  Height: 5\' 1"  (1.549 m)   Not recorded     Body mass index is 38.55 kg/m.  PHYSICAL EXAMNIATION:  Gen: NAD, conversant, well nourised, well groomed                     Cardiovascular: Regular rate rhythm, no peripheral edema, warm, nontender. Eyes: Conjunctivae clear without exudates or hemorrhage Neck: Supple, no carotid bruits. Pulmonary: Clear to auscultation bilaterally   NEUROLOGICAL EXAM:  MENTAL STATUS: Speech/cognition: Awake, alert, oriented to history taking and casual conversation CRANIAL NERVES: CN II: Visual fields are full to confrontation. Pupils are round equal and briskly reactive to light. CN III, IV, VI: extraocular movement are normal. No ptosis. CN V: Facial sensation is intact to light touch CN VII: Face is symmetric with normal eye closure  CN VIII: Hearing is normal to causal conversation. CN IX, X: Phonation is normal. CN XI: Head turning and shoulder shrug are intact  MOTOR: Mild right ankle dorsiflexion weakness  REFLEXES: Reflexes are 2+ and symmetric at the biceps, triceps, knees, and ankles. Plantar responses are flexor.  SENSORY: Decreased sensation at the left lateral thigh  COORDINATION: There is no trunk or limb dysmetria noted.  GAIT/STANCE: Need to push hard to get up from  sitting position, steady, cautious  REVIEW OF SYSTEMS:  Full 14 system review of systems performed and notable only for as above All other review of systems were negative.   ALLERGIES: Allergies  Allergen Reactions   Codeine Itching   Morphine Itching    HOME MEDICATIONS: Current Outpatient Medications  Medication Sig Dispense Refill   albuterol (VENTOLIN HFA) 108 (90 Base) MCG/ACT inhaler Inhale 2 puffs into the lungs every 6 (six) hours as needed for wheezing or shortness of breath. 8 g 0   ALPRAZolam (XANAX) 1 MG tablet Take 1 tablet (1 mg total) by mouth at bedtime. 90 tablet 1   azelastine (ASTELIN) 0.1 % nasal spray Place 2 sprays into both nostrils 2 (two) times daily. Use in each nostril as directed (Patient taking differently: Place 2 sprays into both nostrils 2 (two) times daily as needed. Use in each nostril as directed) 30 mL 4   benzonatate (TESSALON) 100 MG capsule Take 1 capsule (100 mg total) by mouth 2 (two) times daily as needed for cough. 20 capsule 0   cyclobenzaprine (FLEXERIL) 10 MG tablet TAKE 1 TABLET BY MOUTH AT BEDTIME. 90 tablet 0   DULoxetine (CYMBALTA) 60 MG capsule Take 1 capsule (60 mg total) by mouth daily. 90 capsule 0   ergocalciferol (VITAMIN D2) 1.25 MG (50000 UT) capsule Take 1 capsule (50,000 Units total) by mouth once a week. One capsule once weekly 12 capsule 3   esomeprazole (NEXIUM) 40 MG capsule TAKE ONE CAPSULE BY MOUTH ONCE DAILY. 90 capsule 0   ibuprofen (ADVIL) 800 MG tablet Take 1 tablet (800 mg total) by mouth every 8 (eight) hours as needed. 30 tablet 1   lamoTRIgine (LAMICTAL) 150 MG tablet Take 1 tab daily 30 tablet 2   Multiple Vitamins-Minerals (MULTIVITAMIN GUMMIES ADULT PO) Take 1 each by mouth 2 (two) times daily.     potassium chloride (KLOR-CON) 10 MEQ tablet TAKE ONE TABLET BY MOUTH ONCE DAILY. 90 tablet 0   traZODone (DESYREL) 100 MG tablet TAKE (1) TABLET BY MOUTH AT BEDTIME. 30 tablet 0   triamterene-hydrochlorothiazide  (MAXZIDE) 75-50 MG tablet Take 1 tablet by mouth daily. 90 tablet 3   No current facility-administered medications for this visit.    PAST MEDICAL HISTORY: Past Medical History:  Diagnosis Date   Arthritis    Asthma    Phreesia 07/31/2019   Breast discharge    left spont white discharge x 6 months   Carpal tunnel syndrome    left, wears brace at night   Chronic back pain    GERD (gastroesophageal reflux disease)    Hyperlipidemia    Hypertension    Obesity     PAST SURGICAL HISTORY: Past Surgical History:  Procedure Laterality Date   ABDOMINAL HYSTERECTOMY  2003   partial   BREAST BIOPSY Left 2018   benign   CARPAL TUNNEL RELEASE Right    CESAREAN SECTION  1988   CESAREAN SECTION N/A    Phreesia 07/31/2019   CHOLECYSTECTOMY     COLONOSCOPY N/A 09/21/2012   Procedure: COLONOSCOPY;  Surgeon: West Bali, MD;  [facility];  Service: Endoscopy;  Laterality: N/A;  9:30   LAPAROSCOPIC GASTRIC SLEEVE RESECTION WITH HIATAL HERNIA  REPAIR N/A 06/23/2014   Procedure: LAPAROSCOPIC LYSIS OF ADHESIONS, GASTRIC SLEEVE RESECTION AND UPPER ENDO;  Surgeon: Ovidio Kin, MD;  Location: [facility];  Service: General;  Laterality: N/A;   LAPAROSCOPIC LYSIS OF ADHESIONS  06/23/2014   Procedure: LAPAROSCOPIC LYSIS OF ADHESIONS;  Surgeon: Ovidio Kin, MD;  Location: [facility];  Service: General;;   LUMBAR FUSION  01/22/2014   L4 L5       DR Lovell Sheehan   other     Vocal chord polyp removal   ovarian tumor  2005   ovaries removed    SPINE SURGERY N/A    Phreesia 07/31/2019   TONSILLECTOMY     UPPER GI ENDOSCOPY  06/23/2014   Procedure: UPPER GI ENDOSCOPY;  Surgeon: Ovidio Kin, MD;  Location: [facility];  Service: General;;    FAMILY HISTORY: Family History  Problem Relation Age of Onset   Hypertension Mother    Hypertension Father    Hypertension Brother    Cancer Maternal Aunt    Cancer Maternal Uncle        liver   Cancer Paternal Uncle        bone cancer   Diabetes Maternal  Grandmother    Colon cancer Neg Hx     SOCIAL HISTORY: Social History   Socioeconomic History   Marital status: Married    Spouse name: Not on file   Number of children: Not on file   Years of education: Not on file   Highest education level: Associate degree: occupational, Scientist, product/process development, or vocational program  Occupational History   Occupation: Company secretary: [employer]    Comment: [employer]   Tobacco Use   Smoking status: Former    Packs/day: 0.75    Years: 17.00    Additional pack years: 0.00    Total pack years: 12.75    Types: Cigarettes    Quit date: 01/03/2009    Years since quitting: 13.3   Smokeless tobacco: Never  Vaping Use   Vaping Use: Never used  Substance and Sexual Activity   Alcohol use: Yes    Comment: occasionally   Drug use: No   Sexual activity: Yes  Other Topics Concern   Not on file  Social History Narrative   Not on file   Social Determinants of Health   Financial Resource Strain: Not on file  Food Insecurity: No Food Insecurity (04/20/2022)   Hunger Vital Sign    Worried About Running Out of Food in the Last Year: Never true    Ran Out of Food in the Last Year: Never true  Transportation Needs: No Transportation Needs (04/20/2022)   PRAPARE - Administrator, Civil Service (Medical): No    Lack of Transportation (Non-Medical): No  Physical Activity: Unknown (04/20/2022)   Exercise Vital Sign    Days of Exercise per Week: 0 days    Minutes of Exercise per Session: Not on file  Stress: Stress Concern Present (04/20/2022)   [employer] of Occupational Health - Occupational Stress Questionnaire    Feeling of Stress : Rather much  Social Connections: Socially Integrated (04/20/2022)   Social Connection and Isolation Panel [NHANES]    Frequency of Communication with Friends and Family: More than three times a week    Frequency of Social Gatherings with Friends and Family: Twice a week    Attends Religious Services:  More than 4 times per year    Active Member of [employer] or Organizations: Yes  Attends Banker Meetings: 1 to 4 times per year    Marital Status: Married  Catering manager Violence: Not on file      Levert Feinstein, M.D. Ph.D.  [facility] [address], [address] [place], [place] [address] Ph: [phone] Fax: [phone]  CC:  Kerri Perches, MD [address] [place],  [place] [address]  Kerri Perches, MD

## 2022-05-12 ENCOUNTER — Telehealth: Payer: Self-pay | Admitting: Family Medicine

## 2022-05-12 NOTE — Telephone Encounter (Signed)
Mrs. Ander Slade called from [facility] [employer] regarding she received the FMLA forms by fax and they are incomplete and needs to speak with the provider that completed the forms. Mrs. Ander Slade call back # [phone].

## 2022-05-12 NOTE — Telephone Encounter (Signed)
Corrected forms and faxed back

## 2022-05-26 ENCOUNTER — Other Ambulatory Visit: Payer: Self-pay | Admitting: Family Medicine

## 2022-06-01 ENCOUNTER — Encounter: Payer: Self-pay | Admitting: Family Medicine

## 2022-06-01 ENCOUNTER — Encounter: Payer: Self-pay | Admitting: Internal Medicine

## 2022-06-01 ENCOUNTER — Ambulatory Visit (INDEPENDENT_AMBULATORY_CARE_PROVIDER_SITE_OTHER): Payer: BC Managed Care – PPO | Admitting: Internal Medicine

## 2022-06-01 ENCOUNTER — Telehealth: Payer: Self-pay | Admitting: Family Medicine

## 2022-06-01 VITALS — BP 161/96 | HR 67 | Ht 61.0 in | Wt 214.0 lb

## 2022-06-01 DIAGNOSIS — H60501 Unspecified acute noninfective otitis externa, right ear: Secondary | ICD-10-CM | POA: Diagnosis not present

## 2022-06-01 DIAGNOSIS — M5442 Lumbago with sciatica, left side: Secondary | ICD-10-CM | POA: Diagnosis not present

## 2022-06-01 MED ORDER — MELOXICAM 7.5 MG PO TABS
7.5000 mg | ORAL_TABLET | Freq: Every day | ORAL | 0 refills | Status: DC
Start: 2022-06-01 — End: 2022-11-11

## 2022-06-01 MED ORDER — CIPROFLOXACIN-DEXAMETHASONE 0.3-0.1 % OT SUSP
4.0000 [drp] | Freq: Two times a day (BID) | OTIC | 0 refills | Status: AC
Start: 2022-06-01 — End: 2022-06-11

## 2022-06-01 MED ORDER — CIPROFLOXACIN-HYDROCORTISONE 0.2-1 % OT SUSP
3.0000 [drp] | Freq: Two times a day (BID) | OTIC | 0 refills | Status: DC
Start: 2022-06-01 — End: 2022-06-01

## 2022-06-01 NOTE — Patient Instructions (Signed)
Thank you, Ms.Zollie Scale for allowing [place] to provide your care today.   I will ask nurse to irrigate your right ear Use ear drops for one week as prescribed  Use your muscle relaxer as needed for your back pain Prescribed Meloxicam and this should help with acute pain. Do not take ibuprofen or other NSAIDS when taking meloxicam.  Follow up with neurology as planned.       Thurmon Fair, M.D.

## 2022-06-01 NOTE — Telephone Encounter (Signed)
I will send Cipro Dex.

## 2022-06-01 NOTE — Telephone Encounter (Signed)
[facility] called in on ciprofloxacin-hydrocortisone (CIPRO HC OTIC) OTIC suspension     Not covered by pt insurance but CIPRO DEX is covered by [employer]

## 2022-06-01 NOTE — Progress Notes (Unsigned)
   HPI:[name] is a 52 y.o. female who presents for evaluation of back pain and ear pain. Patient fell Sunday, she is experiencing left leg and back pain. Patient also states her right ear has been in pain for three weeks.   Physical Exam: Vitals:   06/01/22 1049  BP: (!) 161/96  Pulse: 67  SpO2: 98%  Weight: 214 lb (97.1 kg)  Height: 5\' 1"  (1.549 m)     Physical Exam Constitutional:      Appearance: She is well-groomed. She is morbidly obese.  HENT:     Right Ear: Tenderness present. No middle ear effusion. There is impacted cerumen.     Left Ear: Tympanic membrane and ear canal normal.     Ears:     Comments: Erythematous R. Ear canal. Musculoskeletal:        General: Tenderness (TTP on lateral side of lower leg, below knee) present. No swelling or deformity.     Left lower leg: No edema.  Skin:    Findings: No bruising or erythema.      Assessment & Plan:   Andy was seen today for fall.  Acute midline low back pain with left-sided sciatica Assessment & Plan: Patient chronic back pain and recent flair with ground level mechanical fall. She hit her left leg and fell on right shoulder. No erythema or bruising. Ambulating well. I do not believe patient needs xray at this time. Will treat exacerbation of chronic back pain with Mobic. Warned not to take other NSAIDS. She will take Flexeril already prescribed. Follow up if symptoms worsen or fail to improve   Orders: -     Meloxicam; Take 1 tablet (7.5 mg total) by mouth daily.  Dispense: 10 tablet; Refill: 0  Acute otitis externa of right ear, unspecified type Assessment & Plan: Nurse irrigated right ear.  Ciprodex prescribed    Orders: -     Ciprofloxacin-dexAMETHasone; Place 4 drops into the right ear 2 (two) times daily for 10 days.  Dispense: 4 mL; Refill: 0      Milus Banister, MD

## 2022-06-02 DIAGNOSIS — H60501 Unspecified acute noninfective otitis externa, right ear: Secondary | ICD-10-CM | POA: Insufficient documentation

## 2022-06-02 NOTE — Assessment & Plan Note (Signed)
Patient chronic back pain and recent flair with ground level mechanical fall. She hit her left leg and fell on right shoulder. No erythema or bruising. Ambulating well. I do not believe patient needs xray at this time. Will treat exacerbation of chronic back pain with Mobic. Warned not to take other NSAIDS. She will take Flexeril already prescribed. Follow up if symptoms worsen or fail to improve

## 2022-06-02 NOTE — Assessment & Plan Note (Addendum)
Nurse irrigated right ear.  Ciprodex prescribed

## 2022-06-06 ENCOUNTER — Encounter: Payer: Self-pay | Admitting: Family Medicine

## 2022-06-06 ENCOUNTER — Other Ambulatory Visit: Payer: Self-pay | Admitting: Family Medicine

## 2022-06-06 ENCOUNTER — Ambulatory Visit (INDEPENDENT_AMBULATORY_CARE_PROVIDER_SITE_OTHER): Payer: BC Managed Care – PPO | Admitting: Internal Medicine

## 2022-06-06 ENCOUNTER — Encounter: Payer: Self-pay | Admitting: Internal Medicine

## 2022-06-06 ENCOUNTER — Encounter: Payer: BC Managed Care – PPO | Admitting: Internal Medicine

## 2022-06-06 VITALS — BP 138/80 | HR 102 | Ht 61.0 in | Wt 203.4 lb

## 2022-06-06 DIAGNOSIS — N3001 Acute cystitis with hematuria: Secondary | ICD-10-CM | POA: Diagnosis not present

## 2022-06-06 DIAGNOSIS — R300 Dysuria: Secondary | ICD-10-CM | POA: Diagnosis not present

## 2022-06-06 DIAGNOSIS — N390 Urinary tract infection, site not specified: Secondary | ICD-10-CM

## 2022-06-06 LAB — POCT URINALYSIS DIP (CLINITEK)
Bilirubin, UA: NEGATIVE
Glucose, UA: NEGATIVE mg/dL
Nitrite, UA: NEGATIVE
POC PROTEIN,UA: 30 — AB
Spec Grav, UA: 1.015 (ref 1.010–1.025)
Urobilinogen, UA: 1 E.U./dL
pH, UA: 7 (ref 5.0–8.0)

## 2022-06-06 MED ORDER — CEPHALEXIN 500 MG PO CAPS
500.0000 mg | ORAL_CAPSULE | Freq: Two times a day (BID) | ORAL | 0 refills | Status: AC
Start: 2022-06-06 — End: 2022-06-11

## 2022-06-06 NOTE — Patient Instructions (Signed)
It was a pleasure to see you today.  Thank you for giving [place] the opportunity to be involved in your care.  Below is a brief recap of your visit and next steps.  We will plan to see you again in September.  Summary Keflex prescribed for treatment of UTI Return to care if your symptoms worsen or fail to improve. Otherwise follow up with Dr. Lodema Hong in September.

## 2022-06-06 NOTE — Progress Notes (Signed)
   Acute Office Visit  Subjective:     Patient ID: Stephanie Cannon, female    DOB: 06/03/1970, 52 y.o.   MRN: [id number]  Chief Complaint  Patient presents with   Urinary Tract Infection    Chills and back pain.   Stephanie Cannon presents today for an acute visit endorsing a 3-day history of UTI symptoms.  She reports back/flank pain, subjective fever/chills, urine discoloration, and foul odor of urine.  She additionally endorses increased urinary frequency.  Denies dysuria and hematuria.  She states that her current symptoms are what she has experienced previously when she has had a UTI.  Per review of prior documentation, it appears she was last treated for UTI in November 2023.  Review of Systems  Constitutional:  Positive for chills and malaise/fatigue. Negative for fever.  Genitourinary:  Positive for flank pain, frequency and urgency. Negative for dysuria and hematuria.      Objective:    BP 138/80   Pulse (!) 102   Ht 5\' 1"  (1.549 m)   Wt 203 lb 6.4 oz (92.3 kg)   SpO2 94%   BMI 38.43 kg/m   Physical Exam Vitals reviewed.  Constitutional:      Appearance: Normal appearance. She is obese.  Cardiovascular:     Rate and Rhythm: Normal rate and regular rhythm.     Pulses: Normal pulses.     Heart sounds: Normal heart sounds. No murmur heard.    No friction rub. No gallop.  Pulmonary:     Effort: Pulmonary effort is normal.     Breath sounds: Normal breath sounds.  Abdominal:     General: Abdomen is flat. Bowel sounds are normal.     Palpations: Abdomen is soft.     Tenderness: There is no right CVA tenderness or left CVA tenderness.  Skin:    General: Skin is warm and dry.     Capillary Refill: Capillary refill takes less than 2 seconds.  Neurological:     General: No focal deficit present.     Mental Status: She is alert and oriented to person, place, and time.      Assessment & Plan:   Problem List Items Addressed This Visit       Acute cystitis with hematuria     Presenting today for an acute visit endorsing symptoms concerning for UTI.  She endorses a 3-day history of discoloration of urine, foul odor of urine, flank pain, increased urinary frequency, and subjective fever/chills.  POC UA obtained today is concerning for UTI.  Of note, last treated for UTI in November 2023.  Urine culture at that time grew Klebsiella resistant to ampicillin. -Keflex 500 mg twice daily x 5 days prescribed for treatment of UTI -Follow-up urine culture -She was instructed to return to care if her symptoms worsen or fail to improve.  Otherwise she will return to care for routine follow-up with Dr. Lodema Hong in September.      Meds ordered this encounter  Medications   cephALEXin (KEFLEX) 500 MG capsule    Sig: Take 1 capsule (500 mg total) by mouth 2 (two) times daily for 5 days.    Dispense:  10 capsule    Refill:  0    Return if symptoms worsen or fail to improve.  Billie Lade, MD

## 2022-06-06 NOTE — Patient Instructions (Signed)

## 2022-06-06 NOTE — Progress Notes (Deleted)
No show appointment  

## 2022-06-06 NOTE — Progress Notes (Unsigned)
Patient Office Visit   Subjective   Patient ID: Stephanie Cannon, female    DOB: 1970/01/19  Age: 52 y.o. MRN: [id number]  CC: No chief complaint on file.   HPI Stephanie Cannon 52 year old female, presents to establish care. She  has a past medical history of Arthritis, Asthma, Breast discharge, Carpal tunnel syndrome, Chronic back pain, GERD (gastroesophageal reflux disease), Hyperlipidemia, Hypertension, and Obesity.  Urinary Tract Infection       Outpatient Encounter Medications as of 06/06/2022  Medication Sig   albuterol (VENTOLIN HFA) 108 (90 Base) MCG/ACT inhaler Inhale 2 puffs into the lungs every 6 (six) hours as needed for wheezing or shortness of breath.   ALPRAZolam (XANAX) 1 MG tablet Take 1 tablet (1 mg total) by mouth at bedtime.   azelastine (ASTELIN) 0.1 % nasal spray Place 2 sprays into both nostrils 2 (two) times daily. Use in each nostril as directed (Patient taking differently: Place 2 sprays into both nostrils 2 (two) times daily as needed. Use in each nostril as directed)   benzonatate (TESSALON) 100 MG capsule Take 1 capsule (100 mg total) by mouth 2 (two) times daily as needed for cough.   ciprofloxacin-dexamethasone (CIPRODEX) OTIC suspension Place 4 drops into the right ear 2 (two) times daily for 10 days.   cyclobenzaprine (FLEXERIL) 10 MG tablet TAKE 1 TABLET BY MOUTH AT BEDTIME.   DULoxetine (CYMBALTA) 60 MG capsule Take 1 capsule (60 mg total) by mouth daily.   ergocalciferol (VITAMIN D2) 1.25 MG (50000 UT) capsule Take 1 capsule (50,000 Units total) by mouth once a week. One capsule once weekly   esomeprazole (NEXIUM) 40 MG capsule TAKE ONE CAPSULE BY MOUTH ONCE DAILY.   ibuprofen (ADVIL) 800 MG tablet Take 1 tablet (800 mg total) by mouth every 8 (eight) hours as needed.   lamoTRIgine (LAMICTAL) 150 MG tablet Take 1 tab daily   meloxicam (MOBIC) 7.5 MG tablet Take 1 tablet (7.5 mg total) by mouth daily.   Multiple Vitamins-Minerals (MULTIVITAMIN  GUMMIES ADULT PO) Take 1 each by mouth 2 (two) times daily.   potassium chloride (KLOR-CON) 10 MEQ tablet TAKE ONE TABLET BY MOUTH ONCE DAILY.   traZODone (DESYREL) 100 MG tablet TAKE (1) TABLET BY MOUTH AT BEDTIME.   triamterene-hydrochlorothiazide (MAXZIDE) 75-50 MG tablet Take 1 tablet by mouth daily.   No facility-administered encounter medications on file as of 06/06/2022.    Past Surgical History:  Procedure Laterality Date   ABDOMINAL HYSTERECTOMY  2003   partial   BREAST BIOPSY Left 2018   benign   CARPAL TUNNEL RELEASE Right    CESAREAN SECTION  1988   CESAREAN SECTION N/A    Phreesia 07/31/2019   CHOLECYSTECTOMY     COLONOSCOPY N/A 09/21/2012   Procedure: COLONOSCOPY;  Surgeon: West Bali, MD;  [facility];  Service: Endoscopy;  Laterality: N/A;  9:30   LAPAROSCOPIC GASTRIC SLEEVE RESECTION WITH HIATAL HERNIA REPAIR N/A 06/23/2014   Procedure: LAPAROSCOPIC LYSIS OF ADHESIONS, GASTRIC SLEEVE RESECTION AND UPPER ENDO;  Surgeon: Ovidio Kin, MD;  [facility];  Service: General;  Laterality: N/A;   LAPAROSCOPIC LYSIS OF ADHESIONS  06/23/2014   Procedure: LAPAROSCOPIC LYSIS OF ADHESIONS;  Surgeon: Ovidio Kin, MD;  [facility];  Service: General;;   LUMBAR FUSION  01/22/2014   L4 L5       DR Lovell Sheehan   other     Vocal chord polyp removal   ovarian tumor  2005  ovaries removed    SPINE SURGERY N/A    Phreesia 07/31/2019   TONSILLECTOMY     UPPER GI ENDOSCOPY  06/23/2014   Procedure: UPPER GI ENDOSCOPY;  Surgeon: Ovidio Kin, MD;  [facility]: [facility];  Service: General;;    ROS    Objective    There were no vitals taken for this visit.  Physical Exam    Assessment & Plan:  There are no diagnoses linked to this encounter.  No follow-ups on file.   Cruzita Lederer Newman Nip, FNP

## 2022-06-06 NOTE — Telephone Encounter (Signed)
scheduled

## 2022-06-06 NOTE — Assessment & Plan Note (Signed)
Presenting today for an acute visit endorsing symptoms concerning for UTI.  She endorses a 3-day history of discoloration of urine, foul odor of urine, flank pain, increased urinary frequency, and subjective fever/chills.  POC UA obtained today is concerning for UTI.  Of note, last treated for UTI in November 2023.  Urine culture at that time grew Klebsiella resistant to ampicillin. -Keflex 500 mg twice daily x 5 days prescribed for treatment of UTI -Follow-up urine culture -She was instructed to return to care if her symptoms worsen or fail to improve.  Otherwise she will return to care for routine follow-up with Dr. Lodema Hong in September.

## 2022-06-06 NOTE — Progress Notes (Deleted)
New Patient Office Visit   Subjective   Patient ID: Stephanie Cannon, female    DOB: Mar 20, 1970  Age: 52 y.o. MRN: [id number]  CC: No chief complaint on file.   HPI Stephanie Cannon presents to establish care. She  has a past medical history of Arthritis, Asthma, Breast discharge, Carpal tunnel syndrome, Chronic back pain, GERD (gastroesophageal reflux disease), Hyperlipidemia, Hypertension, and Obesity.  HPI  ***  Outpatient Encounter Medications as of 06/06/2022  Medication Sig   albuterol (VENTOLIN HFA) 108 (90 Base) MCG/ACT inhaler Inhale 2 puffs into the lungs every 6 (six) hours as needed for wheezing or shortness of breath.   ALPRAZolam (XANAX) 1 MG tablet Take 1 tablet (1 mg total) by mouth at bedtime.   azelastine (ASTELIN) 0.1 % nasal spray Place 2 sprays into both nostrils 2 (two) times daily. Use in each nostril as directed (Patient taking differently: Place 2 sprays into both nostrils 2 (two) times daily as needed. Use in each nostril as directed)   benzonatate (TESSALON) 100 MG capsule Take 1 capsule (100 mg total) by mouth 2 (two) times daily as needed for cough.   ciprofloxacin-dexamethasone (CIPRODEX) OTIC suspension Place 4 drops into the right ear 2 (two) times daily for 10 days.   cyclobenzaprine (FLEXERIL) 10 MG tablet TAKE 1 TABLET BY MOUTH AT BEDTIME.   DULoxetine (CYMBALTA) 60 MG capsule Take 1 capsule (60 mg total) by mouth daily.   ergocalciferol (VITAMIN D2) 1.25 MG (50000 UT) capsule Take 1 capsule (50,000 Units total) by mouth once a week. One capsule once weekly   esomeprazole (NEXIUM) 40 MG capsule TAKE ONE CAPSULE BY MOUTH ONCE DAILY.   ibuprofen (ADVIL) 800 MG tablet Take 1 tablet (800 mg total) by mouth every 8 (eight) hours as needed.   lamoTRIgine (LAMICTAL) 150 MG tablet Take 1 tab daily   meloxicam (MOBIC) 7.5 MG tablet Take 1 tablet (7.5 mg total) by mouth daily.   Multiple Vitamins-Minerals (MULTIVITAMIN GUMMIES ADULT PO) Take 1 each by mouth 2  (two) times daily.   potassium chloride (KLOR-CON) 10 MEQ tablet TAKE ONE TABLET BY MOUTH ONCE DAILY.   traZODone (DESYREL) 100 MG tablet TAKE (1) TABLET BY MOUTH AT BEDTIME.   triamterene-hydrochlorothiazide (MAXZIDE) 75-50 MG tablet Take 1 tablet by mouth daily.   No facility-administered encounter medications on file as of 06/06/2022.    Past Surgical History:  Procedure Laterality Date   ABDOMINAL HYSTERECTOMY  2003   partial   BREAST BIOPSY Left 2018   benign   CARPAL TUNNEL RELEASE Right    CESAREAN SECTION  1988   CESAREAN SECTION N/A    [employer] 07/31/2019   CHOLECYSTECTOMY     COLONOSCOPY N/A 09/21/2012   Procedure: COLONOSCOPY;  Surgeon: West Bali, MD;  Location: [facility];  Service: Endoscopy;  Laterality: N/A;  9:30   LAPAROSCOPIC GASTRIC SLEEVE RESECTION WITH HIATAL HERNIA REPAIR N/A 06/23/2014   Procedure: LAPAROSCOPIC LYSIS OF ADHESIONS, GASTRIC SLEEVE RESECTION AND UPPER ENDO;  Surgeon: Ovidio Kin, MD;  Location: [facility];  Service: General;  Laterality: N/A;   LAPAROSCOPIC LYSIS OF ADHESIONS  06/23/2014   Procedure: LAPAROSCOPIC LYSIS OF ADHESIONS;  Surgeon: Ovidio Kin, MD;  Location: [facility];  Service: General;;   LUMBAR FUSION  01/22/2014   L4 L5       DR Lovell Sheehan   other     Vocal chord polyp removal   ovarian tumor  2005   ovaries removed    SPINE SURGERY  N/A    [employer] 07/31/2019   TONSILLECTOMY     UPPER GI ENDOSCOPY  06/23/2014   Procedure: UPPER GI ENDOSCOPY;  Surgeon: Ovidio Kin, MD;  Location: [facility];  Service: General;;    ROS    Objective    There were no vitals taken for this visit.  Physical Exam    Assessment & Plan:  Urinary tract infection without hematuria, site unspecified    No follow-ups on file.   Cruzita Lederer Newman Nip, FNP

## 2022-06-07 LAB — CBC
Hematocrit: 47.4 % — ABNORMAL HIGH (ref 34.0–46.6)
Hemoglobin: 15.8 g/dL (ref 11.1–15.9)
MCH: 30 pg (ref 26.6–33.0)
MCHC: 33.3 g/dL (ref 31.5–35.7)
MCV: 90 fL (ref 79–97)
Platelets: 209 10*3/uL (ref 150–450)
RBC: 5.27 x10E6/uL (ref 3.77–5.28)
RDW: 13 % (ref 11.7–15.4)
WBC: 14.1 10*3/uL — ABNORMAL HIGH (ref 3.4–10.8)

## 2022-06-07 LAB — LIPID PANEL
Chol/HDL Ratio: 3.4 ratio (ref 0.0–4.4)
Cholesterol, Total: 228 mg/dL — ABNORMAL HIGH (ref 100–199)
HDL: 68 mg/dL (ref >39)
LDL Chol Calc (NIH): 145 mg/dL — ABNORMAL HIGH (ref 0–99)
Triglycerides: 85 mg/dL (ref 0–149)
VLDL Cholesterol Cal: 15 mg/dL (ref 5–40)

## 2022-06-07 LAB — CMP14+EGFR
ALT: 15 IU/L (ref 0–32)
AST: 23 IU/L (ref 0–40)
Albumin/Globulin Ratio: 1.4 (ref 1.2–2.2)
Albumin: 4.5 g/dL (ref 3.8–4.9)
Alkaline Phosphatase: 117 IU/L (ref 44–121)
BUN/Creatinine Ratio: 8 — ABNORMAL LOW (ref 9–23)
BUN: 8 mg/dL (ref 6–24)
Bilirubin Total: 0.6 mg/dL (ref 0.0–1.2)
CO2: 26 mmol/L (ref 20–29)
Calcium: 9.6 mg/dL (ref 8.7–10.2)
Chloride: 93 mmol/L — ABNORMAL LOW (ref 96–106)
Creatinine, Ser: 0.98 mg/dL (ref 0.57–1.00)
Globulin, Total: 3.3 g/dL (ref 1.5–4.5)
Glucose: 80 mg/dL (ref 70–99)
Potassium: 3.7 mmol/L (ref 3.5–5.2)
Sodium: 135 mmol/L (ref 134–144)
Total Protein: 7.8 g/dL (ref 6.0–8.5)
eGFR: 69 mL/min/{1.73_m2} (ref >59)

## 2022-06-07 LAB — TSH: TSH: 1.1 u[IU]/mL (ref 0.450–4.500)

## 2022-06-07 LAB — HEMOGLOBIN A1C
Est. average glucose Bld gHb Est-mCnc: 105 mg/dL
Hgb A1c MFr Bld: 5.3 % (ref 4.8–5.6)

## 2022-06-07 LAB — VITAMIN D 25 HYDROXY (VIT D DEFICIENCY, FRACTURES): Vit D, 25-Hydroxy: 30.9 ng/mL (ref 30.0–100.0)

## 2022-06-08 ENCOUNTER — Other Ambulatory Visit: Payer: Self-pay | Admitting: Family Medicine

## 2022-06-08 DIAGNOSIS — M62830 Muscle spasm of back: Secondary | ICD-10-CM

## 2022-06-09 ENCOUNTER — Encounter (HOSPITAL_COMMUNITY): Payer: Self-pay | Admitting: Psychiatry

## 2022-06-09 ENCOUNTER — Telehealth (HOSPITAL_COMMUNITY): Payer: BC Managed Care – PPO | Admitting: Psychiatry

## 2022-06-09 VITALS — Wt 204.0 lb

## 2022-06-09 DIAGNOSIS — F411 Generalized anxiety disorder: Secondary | ICD-10-CM

## 2022-06-09 DIAGNOSIS — F39 Unspecified mood [affective] disorder: Secondary | ICD-10-CM

## 2022-06-09 MED ORDER — LAMOTRIGINE 150 MG PO TABS: ORAL_TABLET | ORAL | 2 refills | Status: DC

## 2022-06-09 NOTE — Progress Notes (Signed)
[facility] MD Virtual Progress Note   Patient Location: Work Provider Location: Office  I connect with patient by telephone and verified that I am speaking with correct person by using two identifiers. I discussed the limitations of evaluation and management by telemedicine and the availability of in person appointments. I also discussed with the patient that there may be a patient responsible charge related to this service. The patient expressed understanding and agreed to proceed.  Stephanie Cannon [id number] 52 y.o.  06/09/2022 2:48 PM  History of Present Illness:  Patient is evaluated by phone session.  She could not video session because she is working.  On the last visit we increased Lamictal to help her mood and she noticed less irritability, mood swing and anger.  She still struggle with sleep but at least getting 5 hours.  She is taking trazodone and Xanax from primary care and she also taking Cymbalta from neurology.  She reported no rash, itching, shakes or tremors.  She admitted not exercising because taking care of the cousin who has rheumatoid arthritis.  Patient works at the [employer] center at [employer].  Even though school will be close in the summer but [facility] will continue to work and she will be working.  She feels since the medicine adjusted she has no more crying spells, dysphoria, irritability or any panic attack.  She reported her weight fluctuated because recently she was given antibiotics and steroids because of UTI.  She also trying to lose weight and she had lost 7-8 pounds and she is happy about it.  She denies drinking or using any illegal substances.  She feels her job is less stressful since school is closed and there are no students.  Past Psychiatric History: No h/o inpatient treatment or suicidal attempt.  Saw Dr. Tenny Craw twice but no further follow-ups.  Saw therapist but reported to help her son. Tried Prozac temazepam, Ambien,  melatonin and recently BuSpar. No history of psychosis.    Outpatient Encounter Medications as of 06/09/2022  Medication Sig   albuterol (VENTOLIN HFA) 108 (90 Base) MCG/ACT inhaler Inhale 2 puffs into the lungs every 6 (six) hours as needed for wheezing or shortness of breath.   ALPRAZolam (XANAX) 1 MG tablet Take 1 tablet (1 mg total) by mouth at bedtime.   azelastine (ASTELIN) 0.1 % nasal spray Place 2 sprays into both nostrils 2 (two) times daily. Use in each nostril as directed (Patient taking differently: Place 2 sprays into both nostrils 2 (two) times daily as needed. Use in each nostril as directed)   benzonatate (TESSALON) 100 MG capsule Take 1 capsule (100 mg total) by mouth 2 (two) times daily as needed for cough.   cephALEXin (KEFLEX) 500 MG capsule Take 1 capsule (500 mg total) by mouth 2 (two) times daily for 5 days.   ciprofloxacin-dexamethasone (CIPRODEX) OTIC suspension Place 4 drops into the right ear 2 (two) times daily for 10 days.   cyclobenzaprine (FLEXERIL) 10 MG tablet TAKE 1 TABLET BY MOUTH AT BEDTIME.   DULoxetine (CYMBALTA) 60 MG capsule Take 1 capsule (60 mg total) by mouth daily.   ergocalciferol (VITAMIN D2) 1.25 MG (50000 UT) capsule Take 1 capsule (50,000 Units total) by mouth once a week. One capsule once weekly   esomeprazole (NEXIUM) 40 MG capsule TAKE ONE CAPSULE BY MOUTH ONCE DAILY.   ibuprofen (ADVIL) 800 MG tablet Take 1 tablet (800 mg total) by mouth every 8 (eight) hours as needed.  lamoTRIgine (LAMICTAL) 150 MG tablet Take 1 tab daily   meloxicam (MOBIC) 7.5 MG tablet Take 1 tablet (7.5 mg total) by mouth daily.   Multiple Vitamins-Minerals (MULTIVITAMIN GUMMIES ADULT PO) Take 1 each by mouth 2 (two) times daily.   potassium chloride (KLOR-CON) 10 MEQ tablet TAKE ONE TABLET BY MOUTH ONCE DAILY.   traZODone (DESYREL) 100 MG tablet TAKE (1) TABLET BY MOUTH AT BEDTIME.   triamterene-hydrochlorothiazide (MAXZIDE) 75-50 MG tablet Take 1 tablet by mouth daily.    [DISCONTINUED] lamoTRIgine (LAMICTAL) 150 MG tablet Take 1 tab daily   No facility-administered encounter medications on file as of 06/09/2022.    Recent Results (from the past 2160 hour(s))  CBC     Status: Abnormal   Collection Time: 06/06/22  2:17 PM  Result Value Ref Range   WBC 14.1 (H) 3.4 - 10.8 x10E3/uL   RBC 5.27 3.77 - 5.28 x10E6/uL   Hemoglobin 15.8 11.1 - 15.9 g/dL   Hematocrit 16.1 (H) 09.6 - 46.6 %   MCV 90 79 - 97 fL   MCH 30.0 26.6 - 33.0 pg   MCHC 33.3 31.5 - 35.7 g/dL   RDW 04.5 40.9 - 81.1 %   Platelets 209 150 - 450 x10E3/uL  Hemoglobin A1c     Status: None   Collection Time: 06/06/22  2:17 PM  Result Value Ref Range   Hgb A1c MFr Bld 5.3 4.8 - 5.6 %    Comment:          Prediabetes: 5.7 - 6.4          Diabetes: >6.4          Glycemic control for adults with diabetes: <7.0    Est. average glucose Bld gHb Est-mCnc 105 mg/dL  [id number]     Status: Abnormal   Collection Time: 06/06/22  2:17 PM  Result Value Ref Range   Glucose 80 70 - 99 mg/dL   BUN 8 6 - 24 mg/dL   Creatinine, Ser 2.13 0.57 - 1.00 mg/dL   eGFR 69 >08 MV/HQI/6.96   BUN/Creatinine Ratio 8 (L) 9 - 23   Sodium 135 134 - 144 mmol/L   Potassium 3.7 3.5 - 5.2 mmol/L   Chloride 93 (L) 96 - 106 mmol/L   CO2 26 20 - 29 mmol/L   Calcium 9.6 8.7 - 10.2 mg/dL   Total Protein 7.8 6.0 - 8.5 g/dL   Albumin 4.5 3.8 - 4.9 g/dL   Globulin, Total 3.3 1.5 - 4.5 g/dL   Albumin/Globulin Ratio 1.4 1.2 - 2.2   Bilirubin Total 0.6 0.0 - 1.2 mg/dL   Alkaline Phosphatase 117 44 - 121 IU/L   AST 23 0 - 40 IU/L   ALT 15 0 - 32 IU/L  Lipid panel     Status: Abnormal   Collection Time: 06/06/22  2:17 PM  Result Value Ref Range   Cholesterol, Total 228 (H) 100 - 199 mg/dL   Triglycerides 85 0 - 149 mg/dL   HDL 68 >29 mg/dL   VLDL Cholesterol Cal 15 5 - 40 mg/dL   LDL Chol Calc (NIH) 528 (H) 0 - 99 mg/dL   Chol/HDL Ratio 3.4 0.0 - 4.4 ratio    Comment:                                   T. Chol/HDL Ratio  Men  Women                               1/2 Avg.Risk  3.4    3.3                                   Avg.Risk  5.0    4.4                                2X Avg.Risk  9.6    7.1                                3X Avg.Risk 23.4   11.0   TSH     Status: None   Collection Time: 06/06/22  2:17 PM  Result Value Ref Range   TSH 1.100 0.450 - 4.500 uIU/mL  VITAMIN D 25 Hydroxy (Vit-D Deficiency, Fractures)     Status: None   Collection Time: 06/06/22  2:17 PM  Result Value Ref Range   Vit D, 25-Hydroxy 30.9 30.0 - 100.0 ng/mL    Comment: Vitamin D deficiency has been defined by the Institute of Medicine and an Endocrine Society practice guideline as a level of serum 25-OH vitamin D less than 20 ng/mL (1,2). The Endocrine Society went on to further define vitamin D insufficiency as a level between 21 and 29 ng/mL (2). 1. IOM (Institute of Medicine). 2010. Dietary reference    intakes for calcium and D. [place]: The    [employer]. 2. Holick MF, Binkley [place], Bischoff-Ferrari HA, et al.    Evaluation, treatment, and prevention of vitamin D    deficiency: an Endocrine Society clinical practice    guideline. JCEM. 2011 Jul; 96(7):1911-30.   POCT URINALYSIS DIP (CLINITEK)     Status: Abnormal   Collection Time: 06/06/22  2:39 PM  Result Value Ref Range   Color, UA yellow yellow   Clarity, UA clear clear   Glucose, UA negative negative mg/dL   Bilirubin, UA negative negative   Ketones, POC UA small (15) (A) negative mg/dL   Spec Grav, UA 1.610 9.604 - 1.025   Blood, UA moderate (A) negative   pH, UA 7.0 5.0 - 8.0   POC PROTEIN,UA =30 (A) negative, trace   Urobilinogen, UA 1.0 0.2 or 1.0 E.U./dL   Nitrite, UA Negative Negative   Leukocytes, UA Trace (A) Negative     Psychiatric Specialty Exam: Physical Exam  Review of Systems  Weight 204 lb (92.5 kg).There is no height or weight on file to calculate BMI.  General Appearance: NA   Eye Contact:  NA  Speech:  Clear and Coherent  Volume:  Normal  Mood:  Euthymic  Affect:  NA  Thought Process:  Goal Directed  Orientation:  Full (Time, Place, and Person)  Thought Content:  WDL  Suicidal Thoughts:  No  Homicidal Thoughts:  No  Memory:  Immediate;   Good Recent;   Good Remote;   Good  Judgement:  Intact  Insight:  Present  Psychomotor Activity:  NA  Concentration:  Concentration: Good and Attention Span: Good  Recall:  Good  Fund of Knowledge:  Good  Language:  Good  Akathisia:  No  Handed:  Right  AIMS (if indicated):     Assets:  Communication Skills Desire for Improvement Housing Social Support Talents/Skills Transportation  ADL's:  Intact  Cognition:  WNL  Sleep:  fair     Assessment/Plan: Mood disorder (HCC) - Plan: lamoTRIgine (LAMICTAL) 150 MG tablet  GAD (generalized anxiety disorder) - Plan: lamoTRIgine (LAMICTAL) 150 MG tablet  Discussed recent blood work results and current medication.  Patient has a UTI and finishing up the antibiotic.  She liked the increased dose of Lamictal which is helping her mood.  Discussed chronic insomnia and recommend considering sleep study or try higher dose of trazodone up to 150 mg.  Her Xanax and trazodone is prescribed by her primary care.  Continue Lamictal 150 mg daily which is helping her mood and irritability.  She denies any panic attack, recommend to call us back if she is any question or any concern.  Follow-up in 3 months.   Follow Up Instructions:     I discussed the assessment and treatment plan with the patient. The patient was provided an opportunity to ask questions and all were answered. The patient agreed with the plan and demonstrated an understanding of the instructions.   The patient was advised to call back or seek an in-person evaluation if the symptoms worsen or if the condition fails to improve as anticipated.    Collaboration of Care: Other provider involved in patient's care AEB  notes are available in epic to review.  Patient/Guardian was advised Release of Information must be obtained prior to any record release in order to collaborate their care with an outside provider. Patient/Guardian was advised if they have not already done so to contact the registration department to sign all necessary forms in order for [place] to release information regarding their care.   Consent: Patient/Guardian gives verbal consent for treatment and assignment of benefits for services provided during this visit. Patient/Guardian expressed understanding and agreed to proceed.     I provided 19 minutes of non face to face time during this encounter.  Note: This document was prepared by [employer] voice dictation technology and any errors that results from this process are unintentional.    Cleotis Nipper, MD 06/09/2022

## 2022-06-14 ENCOUNTER — Encounter: Payer: Self-pay | Admitting: Family Medicine

## 2022-06-29 ENCOUNTER — Telehealth: Payer: Self-pay | Admitting: Family Medicine

## 2022-06-29 ENCOUNTER — Encounter: Payer: Self-pay | Admitting: Family Medicine

## 2022-06-29 MED ORDER — SAXENDA 18 MG/3ML ~~LOC~~ SOPN: PEN_INJECTOR | SUBCUTANEOUS | 0 refills | Status: DC

## 2022-06-29 NOTE — Telephone Encounter (Signed)
Apolinar Junes called from [facility] needs clarification after this medicine 1st 28 days going to  3mg 's? Liraglutide -Weight Management (SAXENDA) 18 MG/3ML [employer] [[id number]       [facility] [phone]

## 2022-06-29 NOTE — Telephone Encounter (Signed)
Apolinar Junes at [facility] aware.

## 2022-06-30 ENCOUNTER — Other Ambulatory Visit: Payer: Self-pay | Admitting: Family Medicine

## 2022-07-01 ENCOUNTER — Other Ambulatory Visit: Payer: Self-pay | Admitting: Family Medicine

## 2022-07-01 MED ORDER — PHENTERMINE HCL 15 MG PO CAPS: 15.0000 mg | ORAL_CAPSULE | ORAL | 1 refills | Status: DC

## 2022-07-22 ENCOUNTER — Other Ambulatory Visit: Payer: Self-pay

## 2022-07-22 DIAGNOSIS — R5383 Other fatigue: Secondary | ICD-10-CM

## 2022-07-24 ENCOUNTER — Other Ambulatory Visit: Payer: Self-pay | Admitting: Family Medicine

## 2022-07-24 DIAGNOSIS — F419 Anxiety disorder, unspecified: Secondary | ICD-10-CM

## 2022-08-02 ENCOUNTER — Encounter: Payer: Self-pay | Admitting: Family Medicine

## 2022-08-02 DIAGNOSIS — F324 Major depressive disorder, single episode, in partial remission: Secondary | ICD-10-CM

## 2022-08-02 DIAGNOSIS — F4321 Adjustment disorder with depressed mood: Secondary | ICD-10-CM

## 2022-08-02 DIAGNOSIS — F419 Anxiety disorder, unspecified: Secondary | ICD-10-CM

## 2022-08-10 ENCOUNTER — Encounter: Payer: Self-pay | Admitting: Neurology

## 2022-08-15 ENCOUNTER — Other Ambulatory Visit: Payer: Self-pay | Admitting: Family Medicine

## 2022-08-15 ENCOUNTER — Encounter: Payer: Self-pay | Admitting: *Deleted

## 2022-08-19 ENCOUNTER — Other Ambulatory Visit: Payer: Self-pay | Admitting: Family Medicine

## 2022-09-02 ENCOUNTER — Encounter: Payer: Self-pay | Admitting: Family Medicine

## 2022-09-06 ENCOUNTER — Other Ambulatory Visit (HOSPITAL_COMMUNITY): Payer: Self-pay | Admitting: Psychiatry

## 2022-09-06 ENCOUNTER — Other Ambulatory Visit: Payer: Self-pay | Admitting: Family Medicine

## 2022-09-06 DIAGNOSIS — F411 Generalized anxiety disorder: Secondary | ICD-10-CM

## 2022-09-06 DIAGNOSIS — M62830 Muscle spasm of back: Secondary | ICD-10-CM

## 2022-09-06 DIAGNOSIS — F39 Unspecified mood [affective] disorder: Secondary | ICD-10-CM

## 2022-09-08 ENCOUNTER — Telehealth (HOSPITAL_COMMUNITY): Payer: BC Managed Care – PPO | Admitting: Psychiatry

## 2022-09-19 ENCOUNTER — Telehealth: Payer: Self-pay | Admitting: *Deleted

## 2022-09-19 NOTE — Telephone Encounter (Signed)
VM fowarded to [place] from patient she has called several times and no response back. She is wanting to schedule a colonoscopy in December. I see a questionnaire was mailed to patient in August. We do not have her questionnaire back. Fowarded VM back to Michie to call patient.

## 2022-09-22 ENCOUNTER — Telehealth (HOSPITAL_COMMUNITY): Payer: Self-pay

## 2022-09-22 ENCOUNTER — Telehealth: Payer: Self-pay | Admitting: Internal Medicine

## 2022-09-22 DIAGNOSIS — Z1211 Encounter for screening for malignant neoplasm of colon: Secondary | ICD-10-CM

## 2022-09-22 NOTE — Telephone Encounter (Signed)
Called pt to confirm 09/27/22 appt no answer left vm to call by 12 pm 09/26/22 to confirm

## 2022-09-22 NOTE — Telephone Encounter (Signed)
Questionnaire in review. Pt asked to be scheduled in December while she is out on Christmas break.

## 2022-09-22 NOTE — Telephone Encounter (Signed)
Pt called and confirmed appt.

## 2022-09-22 NOTE — Telephone Encounter (Signed)
Procedure: Colonoscopy  Estimated body mass index is 38.55 kg/m as calculated from the following:   Height as of 06/06/22: 5\' 1"  (1.549 m).   Weight as of 06/09/22: 204 lb (92.5 kg).   Have you had a colonoscopy before?  09/2012, [place]  Do you have family history of colon cancer?  no  Do you have a family history of polyps? no  Previous colonoscopy with polyps removed? no  Do you have a history colorectal cancer?   no  Are you diabetic?  no  Do you have a prosthetic or mechanical heart valve? no  Do you have a pacemaker/defibrillator?   no  Have you had endocarditis/atrial fibrillation?  no  Do you use supplemental oxygen/CPAP?  no  Have you had joint replacement within the last 12 months?  no  Do you tend to be constipated or have to use laxatives?  no   Do you have history of alcohol use? If yes, how much and how often.  no  Do you have history or are you using drugs? If yes, what do are you  using?  no  Have you ever had a stroke/heart attack?  no  Have you ever had a heart or other vascular stent placed,?no  Do you take weight loss medication? no  female patients,: have you had a hysterectomy? yes                              are you post menopausal?  yes                              do you still have your menstrual cycle? no    Date of last menstrual period? 2014  Do you take any blood-thinning medications such as: (Plavix, aspirin, Coumadin, Aggrenox, Brilinta, Xarelto, Eliquis, Pradaxa, Savaysa or Effient)? no  If yes we need the name, milligram, dosage and who is prescribing doctor:               Current Outpatient Medications  Medication Sig Dispense Refill   phentermine 15 MG capsule Take 1 capsule (15 mg total) by mouth every morning. 30 capsule 1   albuterol (VENTOLIN HFA) 108 (90 Base) MCG/ACT inhaler Inhale 2 puffs into the lungs every 6 (six) hours as needed for wheezing or shortness of breath. 8 g 0   ALPRAZolam (XANAX) 1 MG tablet TAKE (1) TABLET  BY MOUTH AT BEDTIME. 90 tablet 0   azelastine (ASTELIN) 0.1 % nasal spray Place 2 sprays into both nostrils 2 (two) times daily. Use in each nostril as directed (Patient taking differently: Place 2 sprays into both nostrils 2 (two) times daily as needed. Use in each nostril as directed) 30 mL 4   benzonatate (TESSALON) 100 MG capsule Take 1 capsule (100 mg total) by mouth 2 (two) times daily as needed for cough. 20 capsule 0   cyclobenzaprine (FLEXERIL) 10 MG tablet TAKE 1 TABLET BY MOUTH AT BEDTIME. 90 tablet 0   DULoxetine (CYMBALTA) 60 MG capsule Take 1 capsule (60 mg total) by mouth daily. 90 capsule 3   ergocalciferol (VITAMIN D2) 1.25 MG (50000 UT) capsule Take 1 capsule (50,000 Units total) by mouth once a week. One capsule once weekly 12 capsule 3   esomeprazole (NEXIUM) 40 MG capsule TAKE ONE CAPSULE BY MOUTH ONCE DAILY. 90 capsule 0   ibuprofen (ADVIL) 800 MG tablet Take  1 tablet (800 mg total) by mouth every 8 (eight) hours as needed. 30 tablet 1   lamoTRIgine (LAMICTAL) 150 MG tablet Take 1 tab daily 30 tablet 2   meloxicam (MOBIC) 7.5 MG tablet Take 1 tablet (7.5 mg total) by mouth daily. 10 tablet 0   Multiple Vitamins-Minerals (MULTIVITAMIN GUMMIES ADULT PO) Take 1 each by mouth 2 (two) times daily.     potassium chloride (KLOR-CON) 10 MEQ tablet TAKE ONE TABLET BY MOUTH ONCE DAILY. 90 tablet 0   traZODone (DESYREL) 100 MG tablet TAKE (1) TABLET BY MOUTH AT BEDTIME. 90 tablet 1   triamterene-hydrochlorothiazide (MAXZIDE) 75-50 MG tablet Take 1 tablet by mouth daily. 90 tablet 3   No current facility-administered medications for this visit.    Allergies  Allergen Reactions   Codeine Itching   Morphine Itching

## 2022-09-27 ENCOUNTER — Ambulatory Visit (HOSPITAL_COMMUNITY): Payer: BC Managed Care – PPO | Admitting: Psychiatry

## 2022-09-28 ENCOUNTER — Ambulatory Visit: Payer: BC Managed Care – PPO | Admitting: Family Medicine

## 2022-10-02 ENCOUNTER — Other Ambulatory Visit (HOSPITAL_COMMUNITY): Payer: Self-pay | Admitting: Psychiatry

## 2022-10-02 DIAGNOSIS — F39 Unspecified mood [affective] disorder: Secondary | ICD-10-CM

## 2022-10-02 DIAGNOSIS — F411 Generalized anxiety disorder: Secondary | ICD-10-CM

## 2022-10-04 ENCOUNTER — Ambulatory Visit: Payer: BC Managed Care – PPO | Admitting: Family Medicine

## 2022-10-05 ENCOUNTER — Other Ambulatory Visit: Payer: Self-pay | Admitting: Family Medicine

## 2022-10-05 ENCOUNTER — Ambulatory Visit: Payer: BC Managed Care – PPO | Admitting: Family Medicine

## 2022-10-06 ENCOUNTER — Other Ambulatory Visit: Payer: Self-pay | Admitting: Family Medicine

## 2022-10-07 ENCOUNTER — Other Ambulatory Visit: Payer: Self-pay | Admitting: Family Medicine

## 2022-10-07 ENCOUNTER — Encounter: Payer: Self-pay | Admitting: Family Medicine

## 2022-10-07 DIAGNOSIS — Z1211 Encounter for screening for malignant neoplasm of colon: Secondary | ICD-10-CM

## 2022-10-07 DIAGNOSIS — Z1212 Encounter for screening for malignant neoplasm of rectum: Secondary | ICD-10-CM

## 2022-10-07 NOTE — Telephone Encounter (Signed)
Ok to schedule. ASA 2.  Hold phentermine 7 days.

## 2022-10-10 ENCOUNTER — Other Ambulatory Visit: Payer: Self-pay | Admitting: Family Medicine

## 2022-10-10 DIAGNOSIS — Z1231 Encounter for screening mammogram for malignant neoplasm of breast: Secondary | ICD-10-CM

## 2022-10-10 NOTE — Telephone Encounter (Signed)
Pt wants December. Will call once we receive that schedule

## 2022-10-11 ENCOUNTER — Encounter (HOSPITAL_COMMUNITY): Payer: Self-pay | Admitting: Psychiatry

## 2022-10-11 ENCOUNTER — Telehealth (HOSPITAL_BASED_OUTPATIENT_CLINIC_OR_DEPARTMENT_OTHER): Payer: BC Managed Care – PPO | Admitting: Psychiatry

## 2022-10-11 DIAGNOSIS — F411 Generalized anxiety disorder: Secondary | ICD-10-CM

## 2022-10-11 DIAGNOSIS — F39 Unspecified mood [affective] disorder: Secondary | ICD-10-CM | POA: Diagnosis not present

## 2022-10-11 DIAGNOSIS — F5101 Primary insomnia: Secondary | ICD-10-CM

## 2022-10-11 MED ORDER — TRAZODONE HCL 150 MG PO TABS
150.0000 mg | ORAL_TABLET | Freq: Every day | ORAL | 2 refills | Status: DC
Start: 2022-10-11 — End: 2023-02-09

## 2022-10-11 MED ORDER — LAMOTRIGINE 150 MG PO TABS
ORAL_TABLET | ORAL | 2 refills | Status: DC
Start: 2022-10-11 — End: 2022-12-07

## 2022-10-11 NOTE — Progress Notes (Signed)
[facility] MD Virtual Progress Note   Patient Location: Lobby at URGENT Care Provider Location: Home Office  I connect with patient by telephone and verified that I am speaking with correct person by using two identifiers. I discussed the limitations of evaluation and management by telemedicine and the availability of in person appointments. I also discussed with the patient that there may be a patient responsible charge related to this service. The patient expressed understanding and agreed to proceed.  Stephanie Cannon [id number] 52 y.o.  10/11/2022 2:42 PM  History of Present Illness:  Patient is evaluated by phone session.  She could not do video session because she is in the lobby at urgent care.  Patient told she had a fall while working and needed to come to the emergency room for evaluation.  She is hurting in her left hand and shoulder.  She drove but it has been very difficult.  She also reported summer was very sad and difficult because her 75 year old cousin who had chronic health issues died and she was very close to her.  Patient told that they grew up together and she consider her sister.  She reported not sleeping well and having crying spells, dysphoria and sad.  She reported chronic stress from work.  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  She denies any hallucination, paranoia, suicidal thoughts but reported being isolated, withdrawn with decreased energy and fatigue.  She is taking Xanax from primary care and Cymbalta from neurology.  We have recommended to try higher dose of trazodone to is also prescribed by PCP but patient did not try the higher dose.  Her primary care referred for therapy but there is no earlier appointment and she is sad about it.  Her appointment with Florencia Reasons in December and she is hoping if she can get an earlier appointment.  She is compliant with Lamictal and denies any rash or any itching.  She  denies any mania, anger, violence or any homicidal thoughts.  Past Psychiatric History: No h/o inpatient treatment or suicidal attempt.  Saw Dr. Tenny Craw twice but no further follow-ups.  Saw therapist but reported to help her son. Tried Prozac temazepam, Ambien, melatonin and recently BuSpar. No history of psychosis.    Outpatient Encounter Medications as of 10/11/2022  Medication Sig   phentermine 15 MG capsule Take 1 capsule (15 mg total) by mouth every morning.   albuterol (VENTOLIN HFA) 108 (90 Base) MCG/ACT inhaler Inhale 2 puffs into the lungs every 6 (six) hours as needed for wheezing or shortness of breath.   ALPRAZolam (XANAX) 1 MG tablet TAKE (1) TABLET BY MOUTH AT BEDTIME.   azelastine (ASTELIN) 0.1 % nasal spray Place 2 sprays into both nostrils 2 (two) times daily. Use in each nostril as directed (Patient taking differently: Place 2 sprays into both nostrils 2 (two) times daily as needed. Use in each nostril as directed)   benzonatate (TESSALON) 100 MG capsule Take 1 capsule (100 mg total) by mouth 2 (two) times daily as needed for cough.   cyclobenzaprine (FLEXERIL) 10 MG tablet TAKE 1 TABLET BY MOUTH AT BEDTIME.   DULoxetine (CYMBALTA) 60 MG capsule Take 1 capsule (60 mg total) by mouth daily.   ergocalciferol (VITAMIN D2) 1.25 MG (50000 UT) capsule Take 1 capsule (50,000 Units total) by mouth once a week. One capsule once weekly   esomeprazole (NEXIUM) 40 MG capsule TAKE ONE CAPSULE BY MOUTH ONCE DAILY.  ibuprofen (ADVIL) 800 MG tablet TAKE ONE TABLET BY MOUTH EVERY (8) HOURS AS NEEDED.   lamoTRIgine (LAMICTAL) 150 MG tablet Take 1 tab daily   meloxicam (MOBIC) 7.5 MG tablet Take 1 tablet (7.5 mg total) by mouth daily.   Multiple Vitamins-Minerals (MULTIVITAMIN GUMMIES ADULT PO) Take 1 each by mouth 2 (two) times daily.   potassium chloride (KLOR-CON) 10 MEQ tablet TAKE ONE TABLET BY MOUTH ONCE DAILY.   traZODone (DESYREL) 100 MG tablet TAKE (1) TABLET BY MOUTH AT BEDTIME.    triamterene-hydrochlorothiazide (MAXZIDE) 75-50 MG tablet Take 1 tablet by mouth daily.   No facility-administered encounter medications on file as of 10/11/2022.    No results found for this or any previous visit (from the past 2160 hour(s)).   Psychiatric Specialty Exam: Physical Exam  Review of Systems  Musculoskeletal:        Left shoulder pain    There were no vitals taken for this visit.There is no height or weight on file to calculate BMI.  General Appearance: NA  Eye Contact:  NA  Speech:  Slow  Volume:  Decreased  Mood:  Anxious, Depressed, and Dysphoric  Affect:  NA  Thought Process:  Goal Directed  Orientation:  Full (Time, Place, and Person)  Thought Content:  Rumination  Suicidal Thoughts:  No  Homicidal Thoughts:  No  Memory:  Immediate;   Good Recent;   Fair Remote;   Fair  Judgement:  Fair  Insight:  Fair  Psychomotor Activity:  Decreased  Concentration:  Concentration: Fair and Attention Span: Fair  Recall:  Good  Fund of Knowledge:  Good  Language:  Good  Akathisia:  No  Handed:  Right  AIMS (if indicated):     Assets:  Communication Skills Desire for Improvement Housing Talents/Skills  ADL's:  Intact  Cognition:  WNL  Sleep:  3-4 hrs     Assessment/Plan: Mood disorder (HCC) - Plan: lamoTRIgine (LAMICTAL) 150 MG tablet  GAD (generalized anxiety disorder) - Plan: lamoTRIgine (LAMICTAL) 150 MG tablet, traZODone (DESYREL) 150 MG tablet  Primary insomnia - Plan: traZODone (DESYREL) 150 MG tablet  Patient currently and urgent care for an evaluation.  She had a fall at work.  Discussed psychosocial stressors, depression, insomnia.  Discussed loss of her 34 year old cousin in July.  Recommend to try trazodone 150 as patient did not call the primary care to increase the dose and is still taking 100 mg.  Continue Lamictal 150 mg daily to help her mood.  We will refer for therapy if there is an earlier appointment.  Her Xanax is prescribed by PCP and  Cymbalta prescribed by neurology.  Recommended to call us back if is any question or any concern.  Follow-up in 3 months.   Follow Up Instructions:     I discussed the assessment and treatment plan with the patient. The patient was provided an opportunity to ask questions and all were answered. The patient agreed with the plan and demonstrated an understanding of the instructions.   The patient was advised to call back or seek an in-person evaluation if the symptoms worsen or if the condition fails to improve as anticipated.    Collaboration of Care: Other provider involved in patient's care AEB notes are available in epic.  Patient/Guardian was advised Release of Information must be obtained prior to any record release in order to collaborate their care with an outside provider. Patient/Guardian was advised if they have not already done so to contact the registration department  to sign all necessary forms in order for [place] to release information regarding their care.   Consent: Patient/Guardian gives verbal consent for treatment and assignment of benefits for services provided during this visit. Patient/Guardian expressed understanding and agreed to proceed.     I provided 24 minutes of non face to face time during this encounter.  Note: This document was prepared by [employer] voice dictation technology and any errors that results from this process are unintentional.    Cleotis Nipper, MD 10/11/2022

## 2022-10-12 ENCOUNTER — Ambulatory Visit: Payer: BC Managed Care – PPO | Admitting: Family Medicine

## 2022-10-12 NOTE — Telephone Encounter (Signed)
PCP ordered cologuard 10/07/22. I have sent patient a message to see what she wishes to do.

## 2022-10-14 ENCOUNTER — Telehealth: Payer: Self-pay | Admitting: Family Medicine

## 2022-10-14 NOTE — Telephone Encounter (Signed)
Patient called asking for the office manager to return her call at [phone] regarding a no show letter she received.  She said she has never no show for an appointment with Dr Lodema Hong and asked for the charges to be removed.   Looks like on the schedule for 10.01.2024 patient scheduled through mychart.

## 2022-10-17 ENCOUNTER — Encounter: Payer: Self-pay | Admitting: Family Medicine

## 2022-10-18 MED ORDER — NA SULFATE-K SULFATE-MG SULF 17.5-3.13-1.6 GM/177ML PO SOLN: ORAL | 0 refills | Status: DC

## 2022-10-18 NOTE — Addendum Note (Signed)
Addended by: Armstead Peaks on: 10/18/2022 08:15 AM   Modules accepted: Orders

## 2022-10-18 NOTE — Telephone Encounter (Signed)
Procedure scheduled for 12/13

## 2022-10-24 ENCOUNTER — Encounter: Payer: Self-pay | Admitting: Family Medicine

## 2022-10-24 ENCOUNTER — Other Ambulatory Visit: Payer: Self-pay | Admitting: Family Medicine

## 2022-10-24 DIAGNOSIS — F419 Anxiety disorder, unspecified: Secondary | ICD-10-CM

## 2022-10-24 MED ORDER — ALPRAZOLAM 1 MG PO TABS
1.0000 mg | ORAL_TABLET | Freq: Every day | ORAL | 1 refills | Status: DC
Start: 2022-10-24 — End: 2023-03-21

## 2022-10-24 NOTE — Telephone Encounter (Signed)
Questionnaire from recall, no referral needed  

## 2022-10-26 ENCOUNTER — Ambulatory Visit: Payer: BC Managed Care – PPO | Admitting: Family Medicine

## 2022-10-31 DIAGNOSIS — Z1231 Encounter for screening mammogram for malignant neoplasm of breast: Secondary | ICD-10-CM

## 2022-10-31 NOTE — Telephone Encounter (Signed)
NSF not in patient's account

## 2022-11-02 ENCOUNTER — Encounter: Payer: Self-pay | Admitting: Family Medicine

## 2022-11-02 ENCOUNTER — Ambulatory Visit (INDEPENDENT_AMBULATORY_CARE_PROVIDER_SITE_OTHER): Payer: BC Managed Care – PPO | Admitting: Family Medicine

## 2022-11-02 VITALS — BP 134/85 | HR 73 | Ht 61.0 in | Wt 211.0 lb

## 2022-11-02 DIAGNOSIS — G8929 Other chronic pain: Secondary | ICD-10-CM | POA: Diagnosis not present

## 2022-11-02 DIAGNOSIS — M542 Cervicalgia: Secondary | ICD-10-CM

## 2022-11-02 DIAGNOSIS — Z23 Encounter for immunization: Secondary | ICD-10-CM

## 2022-11-02 DIAGNOSIS — M25512 Pain in left shoulder: Secondary | ICD-10-CM

## 2022-11-02 DIAGNOSIS — I10 Essential (primary) hypertension: Secondary | ICD-10-CM | POA: Diagnosis not present

## 2022-11-02 DIAGNOSIS — F411 Generalized anxiety disorder: Secondary | ICD-10-CM

## 2022-11-02 DIAGNOSIS — F322 Major depressive disorder, single episode, severe without psychotic features: Secondary | ICD-10-CM

## 2022-11-02 MED ORDER — KETOROLAC TROMETHAMINE 60 MG/2ML IM SOLN
60.0000 mg | Freq: Once | INTRAMUSCULAR | Status: AC
Start: 2022-11-02 — End: 2022-11-02
  Administered 2022-11-02: 60 mg via INTRAMUSCULAR

## 2022-11-02 MED ORDER — PHENTERMINE HCL 15 MG PO CAPS: 15.0000 mg | ORAL_CAPSULE | ORAL | 1 refills | Status: DC

## 2022-11-02 NOTE — Patient Instructions (Addendum)
F/U as before, call if you need me sooner  Toradol 60 mg iM in office for shoulder and neck pain  Flu vaccine today  I will message therapist to see if cancellation for sooner appt  Phentermine is re  sent  Thanks for choosing [facility], we consider it a privelige to serve you.

## 2022-11-08 ENCOUNTER — Ambulatory Visit
Admission: RE | Admit: 2022-11-08 | Discharge: 2022-11-08 | Disposition: A | Discharge status: Home / Self Care | Payer: BC Managed Care – PPO | Source: Ambulatory Visit

## 2022-11-08 DIAGNOSIS — Z1231 Encounter for screening mammogram for malignant neoplasm of breast: Secondary | ICD-10-CM

## 2022-11-09 ENCOUNTER — Emergency Department (HOSPITAL_COMMUNITY)
Admission: EM | Admit: 2022-11-09 | Discharge: 2022-11-09 | Disposition: A | Discharge status: Home / Self Care | Payer: Worker's Compensation | Attending: Emergency Medicine | Admitting: Emergency Medicine

## 2022-11-09 ENCOUNTER — Other Ambulatory Visit: Payer: Self-pay

## 2022-11-09 ENCOUNTER — Encounter (HOSPITAL_COMMUNITY): Payer: Self-pay | Admitting: *Deleted

## 2022-11-09 DIAGNOSIS — W07XXXA Fall from chair, initial encounter: Secondary | ICD-10-CM | POA: Insufficient documentation

## 2022-11-09 DIAGNOSIS — E119 Type 2 diabetes mellitus without complications: Secondary | ICD-10-CM | POA: Diagnosis not present

## 2022-11-09 DIAGNOSIS — S46912A Strain of unspecified muscle, fascia and tendon at shoulder and upper arm level, left arm, initial encounter: Secondary | ICD-10-CM | POA: Diagnosis not present

## 2022-11-09 DIAGNOSIS — S4992XA Unspecified injury of left shoulder and upper arm, initial encounter: Secondary | ICD-10-CM | POA: Diagnosis present

## 2022-11-09 MED ORDER — METHYLPREDNISOLONE 4 MG PO TBPK: ORAL_TABLET | ORAL | 0 refills | Status: DC

## 2022-11-09 MED ORDER — CELECOXIB 100 MG PO CAPS
100.0000 mg | ORAL_CAPSULE | Freq: Two times a day (BID) | ORAL | 0 refills | Status: DC
Start: 2022-11-09 — End: 2023-01-05

## 2022-11-09 NOTE — ED Triage Notes (Signed)
Pt states she fell on October 8 and landed on her left arm; pt states she was seen at orthopedic doctor and told nothing was broken and told to wear a sling  Pt states the pain is not getting any better

## 2022-11-09 NOTE — ED Provider Notes (Signed)
[place] EMERGENCY DEPARTMENT AT [facility] Provider Note   CSN: [id number] Arrival date & time: 11/09/22  9562     History  Chief Complaint  Patient presents with   Arm Pain    Stephanie Cannon is a 52 y.o. female.   Arm Pain   This patient is a 52 year old female, she denies being a diabetic or having stomach ulcers.  She reports that about a month ago she had a fall on her left arm when she was sitting in a chair and fell over to the side landing directly laterally on her arm and shoulder.  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  She reports that over the last month she has had no significant improvement, it still hurts especially with trying to raise the arm, minimal pain with internal or external rotation, no numbness or weakness to the hand, she has been referred to orthopedics but is not yet seen them.  She has been using the sling intermittently approximately 6 hours/day.  She reports that she was given a steroid injection in her buttock but not in her shoulder, it did not help much    Home Medications Prior to Admission medications   Medication Sig Start Date End Date Taking? Authorizing Provider  celecoxib (CELEBREX) 100 MG capsule Take 1 capsule (100 mg total) by mouth 2 (two) times daily. 11/09/22  Yes Eber Hong, MD  methylPREDNISolone (MEDROL DOSEPAK) 4 MG TBPK tablet Taper over 6 days 11/09/22  Yes Eber Hong, MD  albuterol (VENTOLIN HFA) 108 (90 Base) MCG/ACT inhaler Inhale 2 puffs into the lungs every 6 (six) hours as needed for wheezing or shortness of breath. 04/15/22   Kerri Perches, MD  ALPRAZolam Prudy Feeler) 1 MG tablet  Take 1 tablet (1 mg total) by mouth at bedtime. 10/24/22 04/22/23  Kerri Perches, MD  azelastine (ASTELIN) 0.1 % nasal spray Place 2 sprays into both nostrils 2 (two) times daily. Use in each nostril as directed Patient taking differently: Place 2 sprays into both nostrils 2 (two) times daily as needed. Use in each nostril as directed 01/04/22   Kerri Perches, MD  benzonatate (TESSALON) 100 MG capsule Take 1 capsule (100 mg total) by mouth 2 (two) times daily as needed for cough. 01/04/22   Kerri Perches, MD  cyclobenzaprine (FLEXERIL) 10 MG tablet TAKE 1 TABLET BY MOUTH AT BEDTIME. 09/06/22   Kerri Perches, MD  DULoxetine (CYMBALTA) 60 MG capsule Take 1 capsule (60 mg total) by mouth daily. 05/10/22   Levert Feinstein, MD  ergocalciferol (VITAMIN D2) 1.25 MG (50000 UT) capsule Take 1 capsule (50,000 Units total) by mouth once a week. One capsule once weekly 06/08/21   Kerri Perches, MD  esomeprazole (NEXIUM) 40 MG capsule TAKE ONE CAPSULE BY MOUTH ONCE DAILY. 08/19/22   Kerri Perches, MD  HYDROcodone-acetaminophen (NORCO/VICODIN) 5-325 MG tablet Take by mouth. 10/25/22   [provider]  ibuprofen (ADVIL) 800 MG tablet TAKE ONE TABLET BY MOUTH EVERY (8) HOURS AS NEEDED. 10/06/22   Kerri Perches, MD  lamoTRIgine (LAMICTAL) 150 MG tablet Take 1 tab daily 10/11/22   Arfeen, Phillips Grout,  MD  meloxicam (MOBIC) 7.5 MG tablet Take 1 tablet (7.5 mg total) by mouth daily. 06/01/22   Gardenia Phlegm, MD  Multiple Vitamins-Minerals (MULTIVITAMIN GUMMIES ADULT PO) Take 1 each by mouth 2 (two) times daily.    [provider]  Na Sulfate-K Sulfate-Mg Sulf 17.5-3.13-1.6 GM/177ML SOLN As directed 10/18/22   Lanelle Bal, DO  phentermine 15 MG capsule Take 1 capsule (15 mg total) by mouth every morning. 11/02/22   Kerri Perches, MD  potassium chloride (KLOR-CON) 10 MEQ tablet TAKE ONE TABLET BY MOUTH ONCE DAILY. 08/19/22   Kerri Perches, MD  traZODone (DESYREL) 150 MG  tablet Take 1 tablet (150 mg total) by mouth at bedtime. 10/11/22   Arfeen, Phillips Grout, MD  triamterene-hydrochlorothiazide (MAXZIDE) 75-50 MG tablet Take 1 tablet by mouth daily. 01/31/22   Kerri Perches, MD      Allergies    Codeine and Morphine    Review of Systems   Review of Systems  All other systems reviewed and are negative.   Physical Exam Updated Vital Signs BP (!) 153/91   Pulse 77   Temp 98.5 F (36.9 C) (Oral)   Resp 16   Ht 1.549 m (5\' 1" )   Wt 96.6 kg   SpO2 99%   BMI 40.25 kg/m  Physical Exam Vitals and nursing note reviewed.  Constitutional:      General: She is not in acute distress.    Appearance: She is well-developed.  HENT:     Head: Normocephalic and atraumatic.     Mouth/Throat:     Pharynx: No oropharyngeal exudate.  Eyes:     General: No scleral icterus.       Right eye: No discharge.        Left eye: No discharge.     Conjunctiva/sclera: Conjunctivae normal.     Pupils: Pupils are equal, round, and reactive to light.  Neck:     Thyroid: No thyromegaly.     Vascular: No JVD.  Cardiovascular:     Rate and Rhythm: Normal rate and regular rhythm.     Heart sounds: Normal heart sounds. No murmur heard.    No friction rub. No gallop.  Pulmonary:     Effort: Pulmonary effort is normal. No respiratory distress.     Breath sounds: Normal breath sounds. No wheezing or rales.  Abdominal:     General: Bowel sounds are normal. There is no distension.     Palpations: Abdomen is soft. There is no mass.     Tenderness: There is no abdominal tenderness.  Musculoskeletal:        General: Tenderness present.     Cervical back: Normal range of motion and neck supple.     Right lower leg: No edema.     Left lower leg: No edema.     Comments: Slight decreased range of motion of the left shoulder, there is no tenderness to palpation over the clavicle or the [facility] joint, she has normal pulses at the left wrist and normal grips, normal range of motion of the  left elbow and wrist and hand.  She has decreased range of motion at the shoulder with ABduction, there is minimal pain with internal and external rotation    Lymphadenopathy:     Cervical: No cervical adenopathy.  Skin:    General: Skin is warm and dry.     Findings: No erythema or rash.  Neurological:     Mental Status: She is  alert.     Coordination: Coordination normal.  Psychiatric:        Behavior: Behavior normal.     ED Results / Procedures / Treatments   Labs (all labs ordered are listed, but only abnormal results are displayed) Labs Reviewed - No data to display  EKG None  Radiology No results found.  Procedures Procedures    Medications Ordered in ED Medications - No data to display  ED Course/ Medical Decision Making/ A&P                                 Medical Decision Making   This patient presents to the ED for concern of ongoing shoulder pain differential diagnosis includes rotator cuff, deltoid, AC joint injuries    Additional history obtained:  Additional history obtained from medical record External records from outside source obtained and reviewed including prior imaging performed a month ago   Lab Tests:  I Ordered, and personally interpreted labs.  The pertinent results include: Not indicated   Imaging Studies ordered:  Patient is requesting an MRI which I told her was not medically indicated, this was not an emergent procedure   Medicines ordered and prescription drug management:  Reviewed the patient's home medications, she states she is not a diabetic and has no history of stomach ulcers, she will be given a Medrol Dosepak and a novel anti-inflammatory   Problem List / ED Course:  Stable for discharge, encouraged range of motion exercises and orthopedic follow-up which she will be given   Social Determinants of Health:  None           Final Clinical Impression(s) / ED Diagnoses Final diagnoses:  Shoulder  strain, left, initial encounter    Rx / DC Orders ED Discharge Orders          Ordered    methylPREDNISolone (MEDROL DOSEPAK) 4 MG TBPK tablet        11/09/22 1009    celecoxib (CELEBREX) 100 MG capsule  2 times daily        11/09/22 1009              Eber Hong, MD 11/09/22 1011

## 2022-11-09 NOTE — Discharge Instructions (Addendum)
I have given you the phone number for local orthopedics, I would recommend calling and making an appointment for follow-up regarding your shoulder which is likely a rotator cuff injury.  You can take the celecoxib twice a day for pain and the Medrol Dosepak for the next 6 days, return to the ER for severe or worsening numbness or weakness however this is a chronic condition and may last for months, the most important thing you can do is follow-up with orthopedics within the next 1 to 2 weeks.  Your family doctor can help arrange this for you if it is not something that the office will see you for quickly

## 2022-11-10 ENCOUNTER — Ambulatory Visit: Payer: BC Managed Care – PPO | Admitting: Family Medicine

## 2022-11-11 ENCOUNTER — Encounter: Payer: Self-pay | Admitting: Family Medicine

## 2022-11-11 ENCOUNTER — Ambulatory Visit (INDEPENDENT_AMBULATORY_CARE_PROVIDER_SITE_OTHER): Payer: BC Managed Care – PPO | Admitting: Family Medicine

## 2022-11-11 VITALS — BP 111/75 | HR 87 | Ht 61.0 in | Wt 210.1 lb

## 2022-11-11 DIAGNOSIS — E538 Deficiency of other specified B group vitamins: Secondary | ICD-10-CM

## 2022-11-11 DIAGNOSIS — E785 Hyperlipidemia, unspecified: Secondary | ICD-10-CM

## 2022-11-11 DIAGNOSIS — F409 Phobic anxiety disorder, unspecified: Secondary | ICD-10-CM

## 2022-11-11 DIAGNOSIS — I10 Essential (primary) hypertension: Secondary | ICD-10-CM | POA: Diagnosis not present

## 2022-11-11 DIAGNOSIS — F322 Major depressive disorder, single episode, severe without psychotic features: Secondary | ICD-10-CM

## 2022-11-11 DIAGNOSIS — R7303 Prediabetes: Secondary | ICD-10-CM

## 2022-11-11 DIAGNOSIS — F411 Generalized anxiety disorder: Secondary | ICD-10-CM

## 2022-11-11 DIAGNOSIS — F5105 Insomnia due to other mental disorder: Secondary | ICD-10-CM

## 2022-11-11 NOTE — Patient Instructions (Signed)
F/u in 4 months, call if you need me sooner   Fasting lipid, chem 7 and B12 first week in December  It is important that you exercise regularly at least 30 minutes 5 times a week. If you develop chest pain, have severe difficulty breathing, or feel very tired, stop exercising immediately and seek medical attention   Think about what you will eat, plan ahead. Choose " clean, green, fresh or frozen" over canned, processed or packaged foods which are more sugary, salty and fatty. 70 to 75% of food eaten should be vegetables and fruit. Three meals at set times with snacks allowed between meals, but they must be fruit or vegetables. Aim to eat over a 12 hour period , example 7 am to 7 pm, and STOP after  your last meal of the day. Drink water,generally about 64 ounces per day, no other drink is as healthy. Fruit juice is best enjoyed in a healthy way, by EATING the fruit. Thanks for choosing [facility], we consider it a privelige to serve you.

## 2022-11-13 DIAGNOSIS — Z23 Encounter for immunization: Secondary | ICD-10-CM | POA: Insufficient documentation

## 2022-11-13 NOTE — Assessment & Plan Note (Signed)
Needs therapy as soon as possible, multiple stressors currently

## 2022-11-13 NOTE — Assessment & Plan Note (Signed)
  Patient re-educated about  the importance of commitment to a  minimum of 150 minutes of exercise per week as able.  Phentermine prescribed     11/11/2022    2:03 PM 11/09/2022    9:20 AM 11/02/2022    9:08 AM  Weight /BMI  Weight 210 lb 1.3 oz 213 lb 211 lb  Height 5\' 1"  (1.549 m) 5\' 1"  (1.549 m) 5\' 1"  (1.549 m)  BMI 39.69 kg/m2 40.25 kg/m2 39.87 kg/m2

## 2022-11-13 NOTE — Progress Notes (Signed)
Stephanie Cannon     MRN: [id number]      DOB: 1970-02-12  Chief Complaint  Patient presents with   Follow-up    Arm shoulder pain from fall at work     HPI Stephanie Cannon is here with c/o uncontrolled left arm and shoulder pain as well as left sided neck pian which she has had with no relief and reported progression after a fall at work This  is being managed through [employer], however requested an appointment because of pain Also reports significant mental and emotional distress due to recent loss of an extremely close friend, as well as problems in her nuclear family which involve herself, her spouse and her adult son and only child which have been ongoing since July 5 She is overwhelmed and distraught, has not been able to speak with Therapist yet ,  and is being treated by Psychiatry. Not taking phentermine but wants it re filled, unccertain if she will be able to focus on weight  loss at this time, but is concerned about weight re gain which is ongoing ROS Denies recent fever or chills. Denies sinus pressure, nasal congestion, ear pain or sore throat. Denies chest congestion, productive cough or wheezing. Denies chest pains, palpitations and leg swelling Denies abdominal pain, nausea, vomiting,diarrhea or constipation.   Denies dysuria, frequency, hesitancy or incontinence.  Denies skin break down or rash.   PE  BP 134/85 (BP Location: Right Arm, Patient Position: Sitting, Cuff Size: Large)   Pulse 73   Ht 5\' 1"  (1.549 m)   Wt 211 lb (95.7 kg)   SpO2 98%   BMI 39.87 kg/m   Patient alert and oriented and in no cardiopulmonary distress.  HEENT: No facial asymmetry, EOMI,     Neck decreased ROM with left trapezius spasm.  Chest: Clear to auscultation bilaterally.  CVS: S1, S2 no murmurs, no S3.Regular rate.  ABD: Soft non tender.   Ext: No edema  MS: Decreased ROM left shoulder, normal in right Skin: Intact, no ulcerations or rash noted.  Psych: Good eye  contact, tearful, crying , anxious and depressed, not suicidal or homicidal CNS: CN 2-12 intact, power,  normal throughout.no focal deficits noted.   Assessment & Plan  Neck pain on left side Uncontrolled and increased since fall on the job, Toradol 60 mg IM administered at visit, advised pt to continue to follow closely with workman's comp  Shoulder pain, left Increased following recent fall on the job, continue management through [employer], toradol 60 mg iM administered  Encounter for immunization After obtaining informed consent, the influenza vaccine is  administered , with no adverse effect noted at the time of administration.   Essential hypertension Controlled, no change in medication DASH diet and commitment to daily physical activity for a minimum of 30 minutes discussed and encouraged, as a part of hypertension management. The importance of attaining a healthy weight is also discussed.     11/11/2022    2:03 PM 11/09/2022    9:20 AM 11/02/2022    9:08 AM 06/09/2022    2:49 PM 06/06/2022    2:28 PM 06/06/2022    2:27 PM 06/01/2022   10:49 AM  BP/Weight  Systolic BP 111 153 134  138 142 161  Diastolic BP 75 91 85  80 81 96  Wt. (Lbs) 210.08 213 211   203.4 214  BMI 39.69 kg/m2 40.25 kg/m2 39.87 kg/m2   38.43 kg/m2 40.43 kg/m2     Information  is confidential and restricted. Go to Review Flowsheets to unlock data.       GAD (generalized anxiety disorder) Needs therapy as soon as possible ,multiple stressors currently  Depression, major, single episode, severe (HCC) Needs therapy as soon as possible, multiple stressors currently  Morbid obesity (HCC)  Patient re-educated about  the importance of commitment to a  minimum of 150 minutes of exercise per week as able.  Phentermine prescribed     11/11/2022    2:03 PM 11/09/2022    9:20 AM 11/02/2022    9:08 AM  Weight /BMI  Weight 210 lb 1.3 oz 213 lb 211 lb  Height 5\' 1"  (1.549 m) 5\' 1"  (1.549 m) 5\' 1"  (1.549  m)  BMI 39.69 kg/m2 40.25 kg/m2 39.87 kg/m2

## 2022-11-13 NOTE — Assessment & Plan Note (Signed)
Increased following recent fall on the job, continue management through [employer], toradol 60 mg iM administered

## 2022-11-13 NOTE — Assessment & Plan Note (Signed)
Uncontrolled and increased since fall on the job, Toradol 60 mg IM administered at visit, advised pt to continue to follow closely with workman's comp

## 2022-11-13 NOTE — Assessment & Plan Note (Signed)
After obtaining informed consent, the influenza vaccine is  administered , with no adverse effect noted at the time of administration.

## 2022-11-13 NOTE — Assessment & Plan Note (Signed)
Controlled, no change in medication DASH diet and commitment to daily physical activity for a minimum of 30 minutes discussed and encouraged, as a part of hypertension management. The importance of attaining a healthy weight is also discussed.     11/11/2022    2:03 PM 11/09/2022    9:20 AM 11/02/2022    9:08 AM 06/09/2022    2:49 PM 06/06/2022    2:28 PM 06/06/2022    2:27 PM 06/01/2022   10:49 AM  BP/Weight  Systolic BP 111 153 134  138 142 161  Diastolic BP 75 91 85  80 81 96  Wt. (Lbs) 210.08 213 211   203.4 214  BMI 39.69 kg/m2 40.25 kg/m2 39.87 kg/m2   38.43 kg/m2 40.43 kg/m2     Information is confidential and restricted. Go to Review Flowsheets to unlock data.

## 2022-11-14 ENCOUNTER — Telehealth (HOSPITAL_COMMUNITY): Payer: Self-pay

## 2022-11-14 NOTE — Telephone Encounter (Signed)
Called pt twice to see about moving np appt with Peggy to tomorrow due to having a cancellation per Dr Lodema Hong messaging me saying that she needs to be seen sooner no answer left vm

## 2022-11-15 ENCOUNTER — Encounter (HOSPITAL_COMMUNITY): Payer: Self-pay | Admitting: Psychiatry

## 2022-11-15 ENCOUNTER — Ambulatory Visit (INDEPENDENT_AMBULATORY_CARE_PROVIDER_SITE_OTHER): Payer: BC Managed Care – PPO | Admitting: Psychiatry

## 2022-11-15 DIAGNOSIS — F411 Generalized anxiety disorder: Secondary | ICD-10-CM

## 2022-11-15 DIAGNOSIS — F39 Unspecified mood [affective] disorder: Secondary | ICD-10-CM
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
## 2022-11-16 ENCOUNTER — Encounter: Payer: Self-pay | Admitting: Orthopedic Surgery

## 2022-11-16 ENCOUNTER — Ambulatory Visit (INDEPENDENT_AMBULATORY_CARE_PROVIDER_SITE_OTHER): Payer: BC Managed Care – PPO | Admitting: Orthopedic Surgery

## 2022-11-16 ENCOUNTER — Other Ambulatory Visit (INDEPENDENT_AMBULATORY_CARE_PROVIDER_SITE_OTHER): Payer: BC Managed Care – PPO

## 2022-11-16 VITALS — BP 165/93 | HR 85 | Ht 61.0 in | Wt 214.0 lb

## 2022-11-16 DIAGNOSIS — W07XXXA Fall from chair, initial encounter: Secondary | ICD-10-CM | POA: Diagnosis not present

## 2022-11-16 DIAGNOSIS — M25512 Pain in left shoulder: Secondary | ICD-10-CM

## 2022-11-16 NOTE — Patient Instructions (Addendum)
Instructions Following Joint Injections  In clinic today, you received an injection in one of your joints (sometimes more than one).  Occasionally, you can have some pain at the injection site, this is normal.  You can place ice at the injection site, or take over-the-counter medications such as Tylenol (acetaminophen) or Advil (ibuprofen).  Please follow all directions listed on the bottle.  If your joint (knee or shoulder) becomes swollen, red or very painful, please contact the clinic for additional assistance.   Two medications were injected, including lidocaine and a steroid (often referred to as cortisone).  Lidocaine is effective almost immediately but wears off quickly.  However, the steroid can take a few days to improve your symptoms.  In some cases, it can make your pain worse for a couple of days.  Do not be concerned if this happens as it is common.  You can apply ice or take some over-the-counter medications as needed.   Injections in the same joint cannot be repeated for 3 months.  This helps to limit the risk of an infection in the joint.  If you were to develop an infection in your joint, the best treatment option would be surgery.     Please provide a note - out until the next visit     Rotator Cuff Tear/Tendinitis Rehab   Ask your health care provider which exercises are safe for you. Do exercises exactly as told by your health care provider and adjust them as directed. It is normal to feel mild stretching, pulling, tightness, or discomfort as you do these exercises. Stop right away if you feel sudden pain or your pain gets worse. Do not begin these exercises until told by your health care provider. Stretching and range-of-motion exercises  These exercises warm up your muscles and joints and improve the movement and flexibility of your shoulder. These exercises also help to relieve pain.  Shoulder pendulum In this exercise, you let the injured arm dangle toward the floor  and then swing it like a clock pendulum. Stand near a table or counter that you can hold onto for balance. Bend forward at the waist and let your left / right arm hang straight down. Use your other arm to support you and help you stay balanced. Relax your left / right arm and shoulder muscles, and move your hips and your trunk so your left / right arm swings freely. Your arm should swing because of the motion of your body, not because you are using your arm or shoulder muscles. Keep moving your hips and trunk so your arm swings in the following directions, as told by your health care provider: Side to side. Forward and backward. In clockwise and counterclockwise circles. Slowly return to the starting position. Repeat 10 times, or for 10 seconds per direction. Complete this exercise 2-3 times a day.      Shoulder flexion, seated This exercise is sometimes called table slides. In this exercise, you raise your arm in front of your body until you feel a stretch in your injured shoulder. Sit in a stable chair so your left / right forearm can rest on a flat surface. Your elbow should rest at a height that keeps your upper arm next to your body. Keeping your left / right shoulder relaxed, lean forward at the waist and let your hand slide forward (flexion). Stop when you feel a stretch in your shoulder, or when you reach the angle that is recommended by your health care provider.  Hold for 5 seconds. Slowly return to the starting position. Repeat 10 times. Complete this exercise 1-2  times a day.       Shoulder flexion, standing In this exercise, you raise your arm in front of your body (flexion) until you feel a stretch in your injured shoulder. Stand and hold a broomstick, a cane, or a similar object. Place your hands a little more than shoulder-width apart on the object. Your left / right hand should be palm-up, and your other hand should be palm-down. Keep your elbow straight and your shoulder  muscles relaxed. Push the stick up with your healthy arm to raise your left / right arm in front of your body, and then over your head until you feel a stretch in your shoulder. Avoid shrugging your shoulder while you raise your arm. Keep your shoulder blade tucked down toward the middle of your back. Keep your left / right shoulder muscles relaxed. Hold for 10 seconds. Slowly return to the starting position. Repeat 10 times. Complete this exercise 1-2 times a day.      Shoulder abduction, active-assisted You will need a stick, broom handle, or similar object to help you (assist) in doing this exercise. Lie on your back. This is the supine position. Hold a broomstick, a cane, or a similar object. Place your hands a little more than shoulder-width apart on the object. Your left / right hand should be palm-up, and your other hand should be palm-down. Keeping your shoulder relaxed, push the stick to raise your left / right arm out to your side (abduction) and then over your head. Use your other hand to help move the stick. Stop when you feel a stretch in your shoulder, or when you reach the angle that is recommended by your health care provider. Avoid shrugging your shoulder while you raise your arm. Keep your shoulder blade tucked down toward the middle of your back. Hold for 10 seconds. Slowly return to the starting position. Repeat 10 times. Complete this exercise 1-2 times a day.      Shoulder flexion, active-assisted Lie on your back. You may bend your knees for comfort. Hold a broomstick, a cane, or a similar object so that your hands are about shoulder-width apart. Your palms should face toward your feet. Raise your left / right arm over your head, then behind your head toward the floor (flexion). Use your other hand to help you do this (active-assisted). Stop when you feel a gentle stretch in your shoulder, or when you reach the angle that is recommended by your health care provider. Hold  for 10 seconds. Use the stick and your other arm to help you return your left / right arm to the starting position. Repeat 10 times. Complete this exercise 1-2 times a day.      External rotation Sit in a stable chair without armrests, or stand up. Tuck a soft object, such as a folded towel or a small ball, under your left / right upper arm. Hold a broomstick, a cane, or a similar object with your palms face-down, toward the floor. Bend your elbows to a 90-degree angle (right angle), and keep your hands about shoulder-width apart. Straighten your healthy arm and push the stick across your body, toward your left / right side. Keep your left / right arm bent. This will rotate your left / right forearm away from your body (external rotation). Hold for 10 seconds. Slowly return to the starting position. Repeat 10 times. Complete  this exercise 1-2 times a day.        Strengthening exercises These exercises build strength and endurance in your shoulder. Endurance is the ability to use your muscles for a long time, even after they get tired. Do not start doing these exercises until your health care provider approves. Shoulder flexion, isometric Stand or sit in a doorway, facing the door frame. Keep your left / right arm straight and make a gentle fist with your hand. Place your fist against the door frame. Only your fist should be touching the frame. Keep your upper arm at your side. Gently press your fist against the door frame, as if you are trying to raise your arm above your head (isometric shoulder flexion). Avoid shrugging your shoulder while you press your hand into the door frame. Keep your shoulder blade tucked down toward the middle of your back. Hold for 10 seconds. Slowly release the tension, and relax your muscles completely before you repeat the exercise. Repeat 10 times. Complete this exercise 3 times per week.      Shoulder abduction, isometric Stand or sit in a doorway. Your  left / right arm should be closest to the door frame. Keep your left / right arm straight, and place the back of your hand against the door frame. Only your hand should be touching the frame. Keep the rest of your arm close to your side. Gently press the back of your hand against the door frame, as if you are trying to raise your arm out to the side (isometric shoulder abduction). Avoid shrugging your shoulder while you press your hand into the door frame. Keep your shoulder blade tucked down toward the middle of your back. Hold for 10 seconds. Slowly release the tension, and relax your muscles completely before you repeat the exercise. Repeat 10 times. Complete this exercise 3 times per week.      Internal rotation, isometric This is an exercise in which you press your palm against a door frame without moving your shoulder joint (isometric). Stand or sit in a doorway, facing the door frame. Bend your left / right elbow, and place the palm of your hand against the door frame. Only your palm should be touching the frame. Keep your upper arm at your side. Gently press your hand against the door frame, as if you are trying to push your arm toward your abdomen (internal rotation). Gradually increase the pressure until you are pressing as hard as you can. Stop increasing the pressure if you feel shoulder pain. Avoid shrugging your shoulder while you press your hand into the door frame. Keep your shoulder blade tucked down toward the middle of your back. Hold for 10 seconds. Slowly release the tension, and relax your muscles completely before you repeat the exercise. Repeat 10 times. Complete this exercise 3 times per week.      External rotation, isometric This is an exercise in which you press the back of your wrist against a door frame without moving your shoulder joint (isometric). Stand or sit in a doorway, facing the door frame. Bend your left / right elbow and place the back of your wrist  against the door frame. Only the back of your wrist should be touching the frame. Keep your upper arm at your side. Gently press your wrist against the door frame, as if you are trying to push your arm away from your abdomen (external rotation). Gradually increase the pressure until you are pressing as hard as  you can. Stop increasing the pressure if you feel pain. Avoid shrugging your shoulder while you press your wrist into the door frame. Keep your shoulder blade tucked down toward the middle of your back. Hold for 10 seconds. Slowly release the tension, and relax your muscles completely before you repeat the exercise. Repeat 10 times. Complete this exercise 3 times per week.       Scapular retraction Sit in a stable chair without armrests, or stand up. Secure an exercise band to a stable object in front of you so the band is at shoulder height. Hold one end of the exercise band in each hand. Your palms should face down. Squeeze your shoulder blades together (retraction) and move your elbows slightly behind you. Do not shrug your shoulders upward while you do this. Hold for 10 seconds. Slowly return to the starting position. Repeat 10 times. Complete this exercise 3 times per week.      Shoulder extension Sit in a stable chair without armrests, or stand up. Secure an exercise band to a stable object in front of you so the band is above shoulder height. Hold one end of the exercise band in each hand. Straighten your elbows and lift your hands up to shoulder height. Squeeze your shoulder blades together and pull your hands down to the sides of your thighs (extension). Stop when your hands are straight down by your sides. Do not let your hands go behind your body. Hold for 10 seconds. Slowly return to the starting position. Repeat 10 times. Complete this exercise 3 times per week.       Scapular protraction, supine Lie on your back on a firm surface (supine position). Hold a 5 lbs (or  soup can) weight in your left / right hand. Raise your left / right arm straight into the air so your hand is directly above your shoulder joint. Push the weight into the air so your shoulder (scapula) lifts off the surface that you are lying on. The scapula will push up or forward (protraction). Do not move your head, neck, or back. Hold for 10 seconds. Slowly return to the starting position. Let your muscles relax completely before you repeat this exercise.  Repeat 10 times. Complete this exercise 3 times per week.

## 2022-11-17 ENCOUNTER — Encounter: Payer: Self-pay | Admitting: Orthopedic Surgery

## 2022-11-17 NOTE — Progress Notes (Signed)
New Patient Visit  Assessment: Stephanie Cannon is a 52 y.o. female with the following: 1. Acute pain of left shoulder  Plan: Zollie Scale fell out of a chair at work, approximate 1 month ago.  Radiographs were negative.  She has difficulty with overhead motion.  Her motion is getting better.  She is clearly uncomfortable.  She does have some pain and tenderness into the trapezius, as well as the left side of her neck.  Medications have not been effective.  At this point, I recommended a steroid injection.  This was completed in clinic today.  She will return to clinic in approximate 1 month.  I gave her some home exercises to initiate.  She was given a letter for work, remaining out into the next visit.  Procedure note injection Left shoulder    Verbal consent was obtained to inject the left shoulder, subacromial space Timeout was completed to confirm the site of injection.  The skin was prepped with alcohol and ethyl chloride was sprayed at the injection site.  A 21-gauge needle was used to inject 40 mg of Depo-Medrol and 1% lidocaine (4 cc) into the subacromial space of the left shoulder using a posterolateral approach.  There were no complications. A sterile bandage was applied.   Follow-up: Return in about 4 weeks (around 12/14/2022).  Subjective:  Chief Complaint  Patient presents with   Shoulder Pain    L shoulder pain after a fall with an outstretched hand DOI 10/11/22. Does have tingling up into the neck and down the arm. Has been wearing a sling and was told to try to come out of it but states it hurts really bad out of sling.   NDC: [address]-7    History of Present Illness: Stephanie Cannon is a 52 y.o. female who presents for evaluation of left shoulder pain.  She is right-hand dominant.  She states that she fell out of a chair at work, approxi-1 month ago.  She braced her fall with her left arm.  She had immediate pain.  She was evaluated in urgent care center, and given  a sling.  She has remained in a sling for the past month.  She states pain medications have not been effective.  She has occasional numbness and tingling.  She does have some pain into the left side of her neck.   Review of Systems: No fevers or chills Occasional numbness or tingling No chest pain No shortness of breath No bowel or bladder dysfunction No GI distress No headaches   Medical History:  Past Medical History:  Diagnosis Date   Arthritis    Asthma    Phreesia 07/31/2019   Breast discharge    left spont white discharge x 6 months   Carpal tunnel syndrome    left, wears brace at night   Chronic back pain    GERD (gastroesophageal reflux disease)    Hyperlipidemia    Hypertension    Obesity     Past Surgical History:  Procedure Laterality Date   ABDOMINAL HYSTERECTOMY  2003   partial   BREAST BIOPSY Left 2018   benign   CARPAL TUNNEL RELEASE Right    CESAREAN SECTION  1988   CESAREAN SECTION N/A    Phreesia 07/31/2019   CHOLECYSTECTOMY     COLONOSCOPY N/A 09/21/2012   Procedure: COLONOSCOPY;  Surgeon: West Bali, MD;  Location: [facility];  Service: Endoscopy;  Laterality: N/A;  9:30   LAPAROSCOPIC GASTRIC SLEEVE  RESECTION WITH HIATAL HERNIA REPAIR N/A 06/23/2014   Procedure: LAPAROSCOPIC LYSIS OF ADHESIONS, GASTRIC SLEEVE RESECTION AND UPPER ENDO;  Surgeon: Ovidio Kin, MD;  Location: [facility];  Service: General;  Laterality: N/A;   LAPAROSCOPIC LYSIS OF ADHESIONS  06/23/2014   Procedure: LAPAROSCOPIC LYSIS OF ADHESIONS;  Surgeon: Ovidio Kin, MD;  Location: [facility];  Service: General;;   LUMBAR FUSION  01/22/2014   L4 L5       DR Lovell Sheehan   other     Vocal chord polyp removal   ovarian tumor  2005   ovaries removed    SPINE SURGERY N/A    Phreesia 07/31/2019   TONSILLECTOMY     UPPER GI ENDOSCOPY  06/23/2014   Procedure: UPPER GI ENDOSCOPY;  Surgeon: Ovidio Kin, MD;  Location: [facility];  Service: General;;    Family History  Problem Relation Age  of Onset   Hypertension Mother    Hypertension Father    Hypertension Brother    Cancer Maternal Aunt    Cancer Maternal Uncle        liver   Cancer Paternal Uncle        bone cancer   Diabetes Maternal Grandmother    Colon cancer Neg Hx    Social History   Tobacco Use   Smoking status: Former    Current packs/day: 0.00    Average packs/day: 0.8 packs/day for 17.0 years (12.8 ttl pk-yrs)    Types: Cigarettes    Start date: 01/04/1992    Quit date: 01/03/2009    Years since quitting: 13.8   Smokeless tobacco: Never  Vaping Use   Vaping status: Never Used  Substance Use Topics   Alcohol use: Yes    Comment: occasionally   Drug use: No    Allergies  Allergen Reactions   Codeine Itching   Morphine Itching    Current Meds  Medication Sig   albuterol (VENTOLIN HFA) 108 (90 Base) MCG/ACT inhaler Inhale 2 puffs into the lungs every 6 (six) hours as needed for wheezing or shortness of breath.   ALPRAZolam (XANAX) 1 MG tablet Take 1 tablet (1 mg total) by mouth at bedtime.   azelastine (ASTELIN) 0.1 % nasal spray Place 2 sprays into both nostrils 2 (two) times daily. Use in each nostril as directed (Patient taking differently: Place 2 sprays into both nostrils 2 (two) times daily as needed. Use in each nostril as directed)   celecoxib (CELEBREX) 100 MG capsule Take 1 capsule (100 mg total) by mouth 2 (two) times daily.   cyclobenzaprine (FLEXERIL) 10 MG tablet TAKE 1 TABLET BY MOUTH AT BEDTIME.   DULoxetine (CYMBALTA) 60 MG capsule Take 1 capsule (60 mg total) by mouth daily.   esomeprazole (NEXIUM) 40 MG capsule TAKE ONE CAPSULE BY MOUTH ONCE DAILY.   HYDROcodone-acetaminophen (NORCO/VICODIN) 5-325 MG tablet Take by mouth.   ibuprofen (ADVIL) 800 MG tablet TAKE ONE TABLET BY MOUTH EVERY (8) HOURS AS NEEDED.   lamoTRIgine (LAMICTAL) 150 MG tablet Take 1 tab daily   Multiple Vitamins-Minerals (MULTIVITAMIN GUMMIES ADULT PO) Take 1 each by mouth 2 (two) times daily.   Na Sulfate-K  Sulfate-Mg Sulf 17.5-3.13-1.6 GM/177ML SOLN As directed   potassium chloride (KLOR-CON) 10 MEQ tablet TAKE ONE TABLET BY MOUTH ONCE DAILY.   traZODone (DESYREL) 150 MG tablet Take 1 tablet (150 mg total) by mouth at bedtime.   triamterene-hydrochlorothiazide (MAXZIDE) 75-50 MG tablet Take 1 tablet by mouth daily.    Objective: BP (!) 165/93  Pulse 85   Ht 5\' 1"  (1.549 m)   Wt 214 lb (97.1 kg)   BMI 40.43 kg/m   Physical Exam:  General: Alert and oriented. and No acute distress. Gait: Normal gait.  Left shoulder without deformity.  No swelling.  Diffuse tenderness to palpation.  110 degrees of forward flexion before becomes painful.  90 degrees of abduction.  Internal rotation to the lower back.  Fingers are warm well-perfused.  2+ radial pulse.  IMAGING: I personally ordered and reviewed the following images   X-rays of the left shoulder were obtained in clinic today.  No comparisons were available.  Glenohumeral joint is reduced.  No evidence of proximal humeral migration.  Minimal degenerative changes are appreciated.  At the footprint of the rotator cuff, there is some calcium deposition.  This does not appear to be acute.  No bony lesions.  No cysts.  Impression: Left shoulder x-ray with calcific tendinitis at the footprint of the rotator cuff   New Medications:  No orders of the defined types were placed in this encounter.     Oliver Barre, MD  11/17/2022 2:10 PM

## 2022-11-21 ENCOUNTER — Telehealth: Payer: Self-pay | Admitting: Neurology

## 2022-11-21 NOTE — Telephone Encounter (Signed)
Called and spoke to [place], She states that she had a fall at work and now gets injections of steroids and pain medications along with taking pain meds. She reports her lower back has been bothering her but she is having a harder time with controlling her urine. She wants to know if anything additionally can be done.

## 2022-11-21 NOTE — Telephone Encounter (Signed)
Set up a virtual visit or in person visit ok with NP or double book my Wed injection slot

## 2022-11-21 NOTE — Telephone Encounter (Signed)
Pt called and LVM needing to discuss her not being able to control her urine. Pt states that provider mentioned that it could be a nerve that is blocked and she is wanting to discuss. Please advise.

## 2022-11-22 ENCOUNTER — Ambulatory Visit (INDEPENDENT_AMBULATORY_CARE_PROVIDER_SITE_OTHER): Payer: BC Managed Care – PPO | Admitting: Orthopedic Surgery

## 2022-11-22 ENCOUNTER — Encounter: Payer: Self-pay | Admitting: Orthopedic Surgery

## 2022-11-22 DIAGNOSIS — M25512 Pain in left shoulder: Secondary | ICD-10-CM | POA: Diagnosis not present

## 2022-11-22 DIAGNOSIS — R2232 Localized swelling, mass and lump, left upper limb: Secondary | ICD-10-CM | POA: Diagnosis not present

## 2022-11-22 DIAGNOSIS — L299 Pruritus, unspecified: Secondary | ICD-10-CM | POA: Diagnosis not present

## 2022-11-22 MED ORDER — PREDNISONE 10 MG (21) PO TBPK
ORAL_TABLET | ORAL | 0 refills | Status: DC
Start: 2022-11-22 — End: 2022-12-14

## 2022-11-22 NOTE — Progress Notes (Signed)
Return patient Visit  Assessment: Stephanie Cannon is a 52 y.o. female with the following: 1. Acute pain of left shoulder  Plan: Zollie Scale had an injection in her left shoulder last week.  She has not noticed significant improvement in her symptoms.  However, she does note itching in her left shoulder, with some associated swelling and tenderness over the superior shoulder.  This could represent a reaction to the injection.  It is also possible that she will continue to note some improvement in her symptoms following the injection.  Nonetheless, I provided her with a short course of prednisone, in hopes of improving the pain, swelling and itching she is having in the left shoulder.   Follow-up: Return in about 22 days (around 12/14/2022).  Subjective:  Chief Complaint  Patient presents with   Shoulder Pain    Pt is c/o swelling at injection site and itching all over her body the day after receiving injection 11/16/22. Pt states she's been taking benadryl since but itching continues.     History of Present Illness: Stephanie Cannon is a 52 y.o. female who returns for evaluation of left shoulder pain.  I saw her in clinic last week.  We injected her left shoulder, after a fall out of a chair at work.  She states that within 1-2 days following the injection, she started to have itching all over.  This has persisted.  She is taking Benadryl regularly.  She is concerned about irritation at the injection site.  Review of Systems: No fevers or chills Occasional numbness or tingling No chest pain No shortness of breath No bowel or bladder dysfunction No GI distress No headaches     Objective: There were no vitals taken for this visit.  Physical Exam:  General: Alert and oriented. and No acute distress. Gait: Normal gait.  Left shoulder without deformity.  Injection site is barely visible.  Diffuse tenderness about the left shoulder.  No swelling.  No rash.  IMAGING: I  personally ordered and reviewed the following images   No new imaging obtained today.  New Medications:  Meds ordered this encounter  Medications   predniSONE (STERAPRED UNI-PAK 21 TAB) 10 MG (21) TBPK tablet    Sig: 10 mg DS 12 as directed    Dispense:  48 tablet    Refill:  0      Oliver Barre, MD  11/22/2022 11:14 PM

## 2022-11-22 NOTE — Telephone Encounter (Signed)
Pt was called, vm was left asking pt to call office to schedule needed appointment with either Dr Terrace Arabia or NP

## 2022-11-24 ENCOUNTER — Encounter: Payer: Self-pay | Admitting: Family Medicine

## 2022-11-28 ENCOUNTER — Telehealth: Payer: Self-pay

## 2022-11-28 NOTE — Telephone Encounter (Signed)
Transition Care Management Unsuccessful Follow-up Telephone Call  Date of discharge and from where:  [facility]6  Attempts:  1st Attempt  Reason for unsuccessful TCM follow-up call:  No answer/busy   Lenard Forth Strandquist  [facility], [facility] Guide, Phone: [phone] Website: [email or web address]

## 2022-11-29 ENCOUNTER — Telehealth: Payer: Self-pay

## 2022-11-29 DIAGNOSIS — E538 Deficiency of other specified B group vitamins: Secondary | ICD-10-CM | POA: Insufficient documentation

## 2022-11-29 NOTE — Progress Notes (Signed)
Stephanie Cannon     MRN: [id number]      DOB: [birth year]  Chief Complaint  Patient presents with   Follow-up    Follow up bp     HPI Stephanie Cannon is here for follow up and re-evaluation of chronic medical conditions, medication management and review of any available recent lab and radiology data.  Preventive health is updated, specifically  Cancer screening and Immunization.   Questions or concerns regarding consultations or procedures which the PT has had in the interim are  addressed. The PT denies any adverse reactions to current medications since the last visit.  There are no new concerns.  There are no specific complaints   ROS Denies recent fever or chills. Denies sinus pressure, nasal congestion, ear pain or sore throat. Denies chest congestion, productive cough or wheezing. Denies chest pains, palpitations and leg swelling Denies abdominal pain, nausea, vomiting,diarrhea or constipation.   Denies dysuria, frequency, hesitancy or incontinence. Denies joint pain, swelling and limitation in mobility. Denies headaches, seizures, numbness, or tingling. Denies depression, anxiety or insomnia. Denies skin break down or rash.   PE  BP 111/75 (BP Location: Right Arm, Patient Position: Sitting, Cuff Size: Large)   Pulse 87   Ht 5\' 1"  (1.549 m)   Wt 210 lb 1.3 oz (95.3 kg)   SpO2 99%   BMI 39.69 kg/m   Patient alert and oriented and in no cardiopulmonary distress.  HEENT: No facial asymmetry, EOMI,     Neck supple .  Chest: Clear to auscultation bilaterally.  CVS: S1, S2 no murmurs, no S3.Regular rate.  ABD: Soft non tender.   Ext: No edema  MS: Adequate ROM spine, shoulders, hips and knees.  Skin: Intact, no ulcerations or rash noted.  Psych: Good eye contact, normal affect. Memory intact not anxious or depressed appearing.  CNS: CN 2-12 intact, power,  normal throughout.no focal deficits noted.   Assessment & Plan  Essential hypertension Controlled, no  change in medication DASH diet and commitment to daily physical activity for a minimum of 30 minutes discussed and encouraged, as a part of hypertension management. The importance of attaining a healthy weight is also discussed.     11/16/2022    3:12 PM 11/11/2022    2:03 PM 11/09/2022    9:20 AM 11/02/2022    9:08 AM 06/09/2022    2:49 PM 06/06/2022    2:28 PM 06/06/2022    2:27 PM  BP/Weight  Systolic BP 165 111 153 134  138 142  Diastolic BP 93 75 91 85  80 81  Wt. (Lbs) 214 210.08 213 211   203.4  BMI 40.43 kg/m2 39.69 kg/m2 40.25 kg/m2 39.87 kg/m2   38.43 kg/m2     Information is confidential and restricted. Go to Review Flowsheets to unlock data.       Hyperlipidemia LDL goal <100 Hyperlipidemia:Low fat diet discussed and encouraged.   Lipid Panel  Lab Results  Component Value Date   CHOL 228 (H) 06/06/2022   HDL 68 06/06/2022   LDLCALC 145 (H) 06/06/2022   TRIG 85 06/06/2022   CHOLHDL 3.4 06/06/2022     Updated lab needed at/ before next visit.   B12 deficiency Updated lab needed at/ before next visit.   Morbid obesity (HCC)  Patient re-educated about  the importance of commitment to a  minimum of 150 minutes of exercise per week as able.  The importance of healthy food choices with portion control discussed, as well as  eating regularly and within a 12 hour window most days. The need to choose "clean , green" food 50 to 75% of the time is discussed, as well as to make water the primary drink and set a goal of 64 ounces water daily.       11/16/2022    3:12 PM 11/11/2022    2:03 PM 11/09/2022    9:20 AM  Weight /BMI  Weight 214 lb 210 lb 1.3 oz 213 lb  Height 5\' 1"  (1.549 m) 5\' 1"  (1.549 m) 5\' 1"  (1.549 m)  BMI 40.43 kg/m2 39.69 kg/m2 40.25 kg/m2      Insomnia due to anxiety and fear Controlled on medication, conitnue same. Does have episodes of anxiety which cause her to awakem from sleep with difficulty faling back to sleep  GAD (generalized  anxiety disorder) Treated by Psych, improved , but anxiously awaiting establishment with therapist  Depression, major, single episode, severe (HCC) Improved on current meds by Psych, despite increased life stess, job and family life, looking forward to therapy and needs this

## 2022-11-29 NOTE — Assessment & Plan Note (Signed)
Treated by Psych, improved , but anxiously awaiting establishment with therapist

## 2022-11-29 NOTE — Assessment & Plan Note (Signed)
Updated lab needed at/ before next visit.   

## 2022-11-29 NOTE — Telephone Encounter (Signed)
Transition Care Management Unsuccessful Follow-up Telephone Call  Date of discharge and from where:  [facility] 11/6  Attempts:  2nd Attempt  Reason for unsuccessful TCM follow-up call:  No answer/busy   Lenard Forth [place]  [facility], [facility] Guide, Phone: [phone] Website: [email or web address]

## 2022-11-29 NOTE — Assessment & Plan Note (Signed)
Controlled on medication, conitnue same. Does have episodes of anxiety which cause her to awakem from sleep with difficulty faling back to sleep

## 2022-11-29 NOTE — Assessment & Plan Note (Signed)
Improved on current meds by Psych, despite increased life stess, job and family life, looking forward to therapy and needs this

## 2022-11-29 NOTE — Assessment & Plan Note (Signed)
  Patient re-educated about  the importance of commitment to a  minimum of 150 minutes of exercise per week as able.  The importance of healthy food choices with portion control discussed, as well as eating regularly and within a 12 hour window most days. The need to choose "clean , green" food 50 to 75% of the time is discussed, as well as to make water the primary drink and set a goal of 64 ounces water daily.       11/16/2022    3:12 PM 11/11/2022    2:03 PM 11/09/2022    9:20 AM  Weight /BMI  Weight 214 lb 210 lb 1.3 oz 213 lb  Height 5\' 1"  (1.549 m) 5\' 1"  (1.549 m) 5\' 1"  (1.549 m)  BMI 40.43 kg/m2 39.69 kg/m2 40.25 kg/m2

## 2022-11-29 NOTE — Assessment & Plan Note (Signed)
Controlled, no change in medication DASH diet and commitment to daily physical activity for a minimum of 30 minutes discussed and encouraged, as a part of hypertension management. The importance of attaining a healthy weight is also discussed.     11/16/2022    3:12 PM 11/11/2022    2:03 PM 11/09/2022    9:20 AM 11/02/2022    9:08 AM 06/09/2022    2:49 PM 06/06/2022    2:28 PM 06/06/2022    2:27 PM  BP/Weight  Systolic BP 165 111 153 134  138 142  Diastolic BP 93 75 91 85  80 81  Wt. (Lbs) 214 210.08 213 211   203.4  BMI 40.43 kg/m2 39.69 kg/m2 40.25 kg/m2 39.87 kg/m2   38.43 kg/m2     Information is confidential and restricted. Go to Review Flowsheets to unlock data.

## 2022-11-29 NOTE — Assessment & Plan Note (Signed)
Hyperlipidemia:Low fat diet discussed and encouraged.   Lipid Panel  Lab Results  Component Value Date   CHOL 228 (H) 06/06/2022   HDL 68 06/06/2022   LDLCALC 145 (H) 06/06/2022   TRIG 85 06/06/2022   CHOLHDL 3.4 06/06/2022     Updated lab needed at/ before next visit.

## 2022-11-30 ENCOUNTER — Other Ambulatory Visit: Payer: Self-pay | Admitting: Family Medicine

## 2022-11-30 DIAGNOSIS — M62830 Muscle spasm of back: Secondary | ICD-10-CM

## 2022-11-30 MED ORDER — CYCLOBENZAPRINE HCL 10 MG PO TABS: 10.0000 mg | ORAL_TABLET | Freq: Every day | ORAL | 0 refills | Status: DC

## 2022-12-02 ENCOUNTER — Encounter: Payer: Self-pay | Admitting: Family Medicine

## 2022-12-05 ENCOUNTER — Other Ambulatory Visit: Payer: Self-pay

## 2022-12-05 MED ORDER — ALBUTEROL SULFATE (2.5 MG/3ML) 0.083% IN NEBU: 2.5000 mg | INHALATION_SOLUTION | Freq: Four times a day (QID) | RESPIRATORY_TRACT | 3 refills | Status: DC | PRN

## 2022-12-05 NOTE — Telephone Encounter (Signed)
Refill sent.

## 2022-12-06 ENCOUNTER — Encounter: Payer: Self-pay | Admitting: Family Medicine

## 2022-12-06 ENCOUNTER — Other Ambulatory Visit: Payer: Self-pay

## 2022-12-06 MED ORDER — NEBULIZER MASK ADULT/TUBING MISC: 1.0000 | Freq: Two times a day (BID) | 2 refills | Status: DC | PRN

## 2022-12-07 ENCOUNTER — Encounter: Payer: Self-pay | Admitting: *Deleted

## 2022-12-07 ENCOUNTER — Telehealth (HOSPITAL_BASED_OUTPATIENT_CLINIC_OR_DEPARTMENT_OTHER): Payer: BC Managed Care – PPO | Admitting: Psychiatry

## 2022-12-07 ENCOUNTER — Telehealth: Payer: Self-pay | Admitting: *Deleted

## 2022-12-07 ENCOUNTER — Encounter (HOSPITAL_COMMUNITY): Payer: Self-pay | Admitting: Psychiatry

## 2022-12-07 VITALS — Wt 204.0 lb

## 2022-12-07 DIAGNOSIS — F4321 Adjustment disorder with depressed mood: Secondary | ICD-10-CM

## 2022-12-07 DIAGNOSIS — F411 Generalized anxiety disorder: Secondary | ICD-10-CM

## 2022-12-07 DIAGNOSIS — F39 Unspecified mood [affective] disorder: Secondary | ICD-10-CM | POA: Diagnosis not present

## 2022-12-07 DIAGNOSIS — F5101 Primary insomnia: Secondary | ICD-10-CM | POA: Diagnosis not present

## 2022-12-07 MED ORDER — LAMOTRIGINE 200 MG PO TABS: ORAL_TABLET | ORAL | 1 refills | Status: DC

## 2022-12-07 MED ORDER — AMITRIPTYLINE HCL 50 MG PO TABS: 25.0000 mg | ORAL_TABLET | Freq: Every day | ORAL | 1 refills | Status: DC

## 2022-12-07 NOTE — Telephone Encounter (Signed)
Pt's instructions sent via MyChart per pt's request

## 2022-12-07 NOTE — Telephone Encounter (Signed)
Pt left vm that she needed her instructions.  LMTRC

## 2022-12-07 NOTE — Progress Notes (Signed)
[facility] MD Virtual Progress Note   Patient Location: Home Provider Location: Home Office  I connect with patient by video and verified that I am speaking with correct person by using two identifiers. I discussed the limitations of evaluation and management by telemedicine and the availability of in person appointments. I also discussed with the patient that there may be a patient responsible charge related to this service. The patient expressed understanding and agreed to proceed.  Stephanie Cannon [id number] 52 y.o.  12/07/2022 11:05 AM  History of Present Illness:  Patient is a evaluated by video session.  She reported a lot of stress, anxiety, crying spells.  She has not back to work since October 7 when she had a fall at work.  Patient is very frustrated with workmen com insurance.  She was not given pain medicine until recently and finally she had approved for MRI.  She reported a lot of crying spells.  Patient reported her husband does not understand when she cried and think about her cousin who died this summer.  She was finally able to get appointment with Florencia Reasons.  She reported a lot of pain in her back, neck, shoulder.  She has a lot of tingling.  She saw a neurologist and needed more past.  She was given Flexeril but she is not taking because sometimes it does not help.  She is taking Cymbalta prescribed by neurology for neuropathy and pain.  We recommended increase trazodone 150 but patient do not see any improvement in her sleep.  Lamictal 150 mg.  Sometimes she complained of itching but no rash.  She admitted a lot of irritability, frustration, anger but denies any suicidal thoughts or homicidal thoughts.  She has no tremors.  Her plan is to continue therapy with Florencia Reasons.  Her biggest concern is pain and financial needs has not able to get the papers since the last day of work on September 7.  She reported her appetite is not good and her weight fluctuates.  She  admitted weight loss because there are days when she does not eat at all.  Patient lives with her husband who sometime he not understand about her depression.  Her parents live close by.  Past Psychiatric History: No h/o inpatient treatment or suicidal attempt.  Saw Dr. Tenny Craw twice but no further follow-ups.  Saw therapist but reported to help her son. Tried Prozac temazepam, Ambien, melatonin and recently BuSpar. No history of psychosis.    Outpatient Encounter Medications as of 12/07/2022  Medication Sig   Respiratory Therapy Supplies (NEBULIZER MASK ADULT/TUBING) MISC 1 each by Does not apply route 2 (two) times daily as needed.   albuterol (PROVENTIL) (2.5 MG/3ML) 0.083% nebulizer solution Take 3 mLs (2.5 mg total) by nebulization every 6 (six) hours as needed for wheezing or shortness of breath.   albuterol (VENTOLIN HFA) 108 (90 Base) MCG/ACT inhaler Inhale 2 puffs into the lungs every 6 (six) hours as needed for wheezing or shortness of breath.   ALPRAZolam (XANAX) 1 MG tablet Take 1 tablet (1 mg total) by mouth at bedtime.   azelastine (ASTELIN) 0.1 % nasal spray Place 2 sprays into both nostrils 2 (two) times daily. Use in each nostril as directed (Patient taking differently: Place 2 sprays into both nostrils 2 (two) times daily as needed. Use in each nostril as directed)   celecoxib (CELEBREX) 100 MG capsule Take 1 capsule (100 mg total) by mouth 2 (two) times daily.  cyclobenzaprine (FLEXERIL) 10 MG tablet Take 1 tablet (10 mg total) by mouth at bedtime.   DULoxetine (CYMBALTA) 60 MG capsule Take 1 capsule (60 mg total) by mouth daily.   esomeprazole (NEXIUM) 40 MG capsule TAKE ONE CAPSULE BY MOUTH ONCE DAILY.   HYDROcodone-acetaminophen (NORCO/VICODIN) 5-325 MG tablet Take by mouth.   ibuprofen (ADVIL) 800 MG tablet TAKE ONE TABLET BY MOUTH EVERY (8) HOURS AS NEEDED.   lamoTRIgine (LAMICTAL) 150 MG tablet Take 1 tab daily   Multiple Vitamins-Minerals (MULTIVITAMIN GUMMIES ADULT PO)  Take 1 each by mouth 2 (two) times daily.   Na Sulfate-K Sulfate-Mg Sulf 17.5-3.13-1.6 GM/177ML SOLN As directed   potassium chloride (KLOR-CON) 10 MEQ tablet TAKE ONE TABLET BY MOUTH ONCE DAILY.   predniSONE (STERAPRED UNI-PAK 21 TAB) 10 MG (21) TBPK tablet 10 mg DS 12 as directed   traZODone (DESYREL) 150 MG tablet Take 1 tablet (150 mg total) by mouth at bedtime.   triamterene-hydrochlorothiazide (MAXZIDE) 75-50 MG tablet Take 1 tablet by mouth daily.   No facility-administered encounter medications on file as of 12/07/2022.    No results found for this or any previous visit (from the past 2160 hour(s)).   Psychiatric Specialty Exam: Physical Exam  Review of Systems  Constitutional:  Positive for appetite change.  Musculoskeletal:  Positive for neck pain and neck stiffness.       Left shoulder pain  Neurological:  Positive for numbness.  Psychiatric/Behavioral:  Positive for decreased concentration, dysphoric mood and sleep disturbance.     Weight 204 lb (92.5 kg).There is no height or weight on file to calculate BMI.  General Appearance: Casual, emotional and tearful  Eye Contact:  Fair  Speech:  Slow  Volume:  Decreased  Mood:  Anxious, Depressed, and Dysphoric  Affect:  Constricted and Depressed  Thought Process:  Descriptions of Associations: Intact  Orientation:  Full (Time, Place, and Person)  Thought Content:  Rumination  Suicidal Thoughts:  No  Homicidal Thoughts:  No  Memory:  Immediate;   Good Recent;   Good Remote;   Fair  Judgement:  Intact  Insight:  Present  Psychomotor Activity:  Decreased  Concentration:  Concentration: Fair and Attention Span: Fair  Recall:  Good  Fund of Knowledge:  Good  Language:  Good  Akathisia:  No  Handed:  Right  AIMS (if indicated):     Assets:  Communication Skills Desire for Improvement Housing  ADL's:  Intact  Cognition:  WNL  Sleep:  fair     Assessment/Plan: Mood disorder (HCC) - Plan: lamoTRIgine (LAMICTAL) 200  MG tablet, amitriptyline (ELAVIL) 50 MG tablet  GAD (generalized anxiety disorder) - Plan: lamoTRIgine (LAMICTAL) 200 MG tablet, amitriptyline (ELAVIL) 50 MG tablet  Primary insomnia - Plan: amitriptyline (ELAVIL) 50 MG tablet  Grief - Plan: amitriptyline (ELAVIL) 50 MG tablet  Discussed worsening of depression, anxiety and psychosocial stressors.  Complaining of a lot of pain and not able to go back to work because she cannot drive and excessive pain in her back shoulder and neck.  Recommended discontinue trazodone as do not feel any improvement.  Will try amitriptyline 25-50 mg and can go up to 100 if needed to help her sleep.  I discussed extra benefits for anxiety and chronic pain.  She has appointment coming up with neurology and she will keep the appointment.  I also encouraged to see Florencia Reasons on a regular basis to help her coping skills.  We talk about grief and she agreed that  she will discuss about her grief with the Peggy Bynum.  We will also increase Lamictal 150 mg to 200 mg to help mood lability.  Reminded if she has any rash and she need to stop the medication and call us immediately.  Discussed safety concerns and any time having active suicidal thoughts or homicidal thought then she need to call us back.  Follow-up in 2 months.   Follow Up Instructions:     I discussed the assessment and treatment plan with the patient. The patient was provided an opportunity to ask questions and all were answered. The patient agreed with the plan and demonstrated an understanding of the instructions.   The patient was advised to call back or seek an in-person evaluation if the symptoms worsen or if the condition fails to improve as anticipated.    Collaboration of Care: Other provider involved in patient's care AEB notes are available in epic to review  Patient/Guardian was advised Release of Information must be obtained prior to any record release in order to collaborate their care with an  outside provider. Patient/Guardian was advised if they have not already done so to contact the registration department to sign all necessary forms in order for [place] to release information regarding their care.   Consent: Patient/Guardian gives verbal consent for treatment and assignment of benefits for services provided during this visit. Patient/Guardian expressed understanding and agreed to proceed.     I provided 28 minutes of non face to face time during this encounter.  Note: This document was prepared by [employer] voice dictation technology and any errors that results from this process are unintentional.    Cleotis Nipper, MD 12/07/2022

## 2022-12-08 ENCOUNTER — Other Ambulatory Visit (HOSPITAL_COMMUNITY)
Admission: RE | Admit: 2022-12-08 | Discharge: 2022-12-08 | Disposition: A | Discharge status: Home / Self Care | Payer: BC Managed Care – PPO | Source: Ambulatory Visit | Attending: Internal Medicine | Admitting: Internal Medicine

## 2022-12-08 ENCOUNTER — Other Ambulatory Visit (HOSPITAL_COMMUNITY)
Admission: RE | Admit: 2022-12-08 | Discharge: 2022-12-08 | Disposition: A | Discharge status: Home / Self Care | Payer: BC Managed Care – PPO | Source: Ambulatory Visit | Attending: Family Medicine | Admitting: Family Medicine

## 2022-12-08 DIAGNOSIS — Z1211 Encounter for screening for malignant neoplasm of colon: Secondary | ICD-10-CM | POA: Insufficient documentation

## 2022-12-08 DIAGNOSIS — E538 Deficiency of other specified B group vitamins: Secondary | ICD-10-CM | POA: Insufficient documentation

## 2022-12-08 DIAGNOSIS — E785 Hyperlipidemia, unspecified: Secondary | ICD-10-CM | POA: Diagnosis not present

## 2022-12-08 DIAGNOSIS — R7303 Prediabetes: Secondary | ICD-10-CM | POA: Diagnosis not present

## 2022-12-08 LAB — LIPID PANEL
Cholesterol: 205 mg/dL — ABNORMAL HIGH (ref 0–200)
HDL: 77 mg/dL (ref >40)
LDL Cholesterol: 123 mg/dL — ABNORMAL HIGH (ref 0–99)
Total CHOL/HDL Ratio: 2.7 {ratio}
Triglycerides: 26 mg/dL (ref <150)
VLDL: 5 mg/dL (ref 0–40)

## 2022-12-08 LAB — BASIC METABOLIC PANEL
Anion gap: 10 (ref 5–15)
BUN: 21 mg/dL — ABNORMAL HIGH (ref 6–20)
CO2: 27 mmol/L (ref 22–32)
Calcium: 9.1 mg/dL (ref 8.9–10.3)
Chloride: 99 mmol/L (ref 98–111)
Creatinine, Ser: 0.85 mg/dL (ref 0.44–1.00)
GFR, Estimated: >60 mL/min (ref >60)
Glucose, Bld: 95 mg/dL (ref 70–99)
Potassium: 3.3 mmol/L — ABNORMAL LOW (ref 3.5–5.1)
Sodium: 136 mmol/L (ref 135–145)

## 2022-12-08 LAB — VITAMIN B12: Vitamin B-12: 711 pg/mL (ref 180–914)

## 2022-12-12 ENCOUNTER — Ambulatory Visit (INDEPENDENT_AMBULATORY_CARE_PROVIDER_SITE_OTHER): Payer: BC Managed Care – PPO | Admitting: Psychiatry

## 2022-12-12 ENCOUNTER — Ambulatory Visit (HOSPITAL_COMMUNITY): Payer: BC Managed Care – PPO | Admitting: Psychiatry

## 2022-12-12 DIAGNOSIS — F39 Unspecified mood [affective] disorder: Secondary | ICD-10-CM | POA: Diagnosis not present

## 2022-12-12 NOTE — Progress Notes (Signed)
IN-PERSON  THERAPIST PROGRESS NOTE  Session Time: Monday 12/12/2022 3:04 PM -  3:55 PM   Participation Level: Active  Behavioral Response: CasualAlertAnxious and Depressed  Type of Therapy: Individual Therapy  Treatment Goals addressed: Establish therapeutic alliance, identify and verbalize feelings associated with loss  ProgressTowards Goals: Formal treatment plan will be developed next session   Interventions: Supportive  Summary: Stephanie Cannon is a 52 y.o. female who is referred for services by PCP Dr. Lodema Hong due to pt experiencing symptoms of anxiety. Pt sees psychiatrist Dr. Lolly Mustache for medication management. Pt denies any psychiatric hospitalizations. Pt participated in teleheath counseling with son last year. Pt reports experiencing grief and loss issues related to the death  of a cousin who was like a sister in July 2024. She states she can't hardly talk about her without becoming very emotional. She also reports stress related to recently being injured on the job. She says she has been made to feel like "I am the blame" by her employer. She reports financial stress. She reports additional stress regarding conflict between son and husband resulting in them not talking to leach other since July 2024. She also worries about her mother as she suspects mother may have dementia. Pt's current symptoms include sadness, worry, tearfulness,  difficulty concentration, fatigue, irritability, sleep difficulty, and restlessness.    Patient last was seen about 3 weeks ago.  She continues to experience symptoms of depression and anxiety as reflected in the PHQ 2 and 9 and the GAD-7.  Patient continues to experience grief and loss issues regarding the death of her friend and reports being very emotional.  She continues to express frustration as husband does not understand and is not supportive regarding her grief and loss issues.  She also reports marital issues as husband has anger issues.  She  reports increased irritability along with excessive worry.  She has hired an Pensions consultant to assist her with her Workmen's Comp. claim and expresses less stress regarding this.  However, she reports continued financial stress due to not having a check.  Patient remains on medical leave from work.   Suicidal/Homicidal: Nowithout intent/plan  Therapist Response: Reviewed symptoms, administered PHQ 29 and the GAD-7, discussed results, gathered more information from patient, discussed stressors, facilitated expression of thoughts and feelings, validated feelings, facilitated patient sharing the narrative of cousin's death, normalized feelings related to grief and loss, began to discuss possible goals for treatment  Plan: Return again in 2 weeks.  Diagnosis: Mood disorder  Collaboration of Care: Psychiatrist AEB patient sees psychiatrist Dr. Lolly Mustache for medication management  Patient/Guardian was advised Release of Information must be obtained prior to any record release in order to collaborate their care with an outside provider. Patient/Guardian was advised if they have not already done so to contact the registration department to sign all necessary forms in order for [place] to release information regarding their care.   Consent: Patient/Guardian gives verbal consent for treatment and assignment of benefits for services provided during this visit. Patient/Guardian expressed understanding and agreed to proceed.   Adah Salvage, LCSW 12/12/2022

## 2022-12-14 ENCOUNTER — Ambulatory Visit: Payer: BC Managed Care – PPO | Admitting: Orthopedic Surgery

## 2022-12-14 ENCOUNTER — Ambulatory Visit (INDEPENDENT_AMBULATORY_CARE_PROVIDER_SITE_OTHER): Payer: BC Managed Care – PPO | Admitting: Neurology

## 2022-12-14 ENCOUNTER — Encounter: Payer: Self-pay | Admitting: Neurology

## 2022-12-14 ENCOUNTER — Other Ambulatory Visit: Payer: Self-pay

## 2022-12-14 VITALS — BP 143/72 | HR 82 | Ht 61.0 in | Wt 216.0 lb

## 2022-12-14 DIAGNOSIS — R32 Unspecified urinary incontinence: Secondary | ICD-10-CM

## 2022-12-14 DIAGNOSIS — G5712 Meralgia paresthetica, left lower limb: Secondary | ICD-10-CM | POA: Diagnosis not present

## 2022-12-14 DIAGNOSIS — R202 Paresthesia of skin: Secondary | ICD-10-CM

## 2022-12-14 DIAGNOSIS — J45909 Unspecified asthma, uncomplicated: Secondary | ICD-10-CM

## 2022-12-14 NOTE — Progress Notes (Signed)
Chief Complaint  Patient presents with   Follow-up    RM15, alone, Paresthesia from left knee and radiates to groin area, urinary incontinence       ASSESSMENT AND PLAN  Stephanie Cannon is a 52 y.o. female  Left thigh numbness,  Most consistent with left meralgia paresthetica Worsening low back pain and urinary urgency,  Likely due to combination of deconditioning, obesity, also had a history of of lumbar decompression surgery, can be residual symptoms from previous lumbar stenosis  MRI of the lumbar spine  EMG nerve conduction study   DIAGNOSTIC DATA (LABS, IMAGING, TESTING) - I reviewed patient records, labs, notes, testing and imaging myself where available.  MEDICAL HISTORY:  Stephanie Cannon is a 52 year old female, seen in request by her primary care physician Dr. Lodema Hong, Milus Mallick, for evaluation of left thigh numbness, initial evaluation in October 12, 2021  I reviewed and summarized the referring note. PMHX Depression, Anxiety Chronic insomnia GERD HTN Right CTS release, did help Obesity Gastric bypass in 2016, lost 100 LB Lumbar decompression surgery in 2016 for severe low back pain, left radiculopathy, Dr. Lovell Sheehan  She has a history of lumbar decompression surgery in 2016 for severe low back pain, left lower extremity radiating pain, surgery did help her symptoms, she worked best for job, sitting for prolonged hours but still had intermittent low back pain, especially with prolonged sitting  Since February 2023, she noticed left lateral thigh area numbness, burning, tingly sensation, been persistent since its onset  She has a long history of urinary urgency, no worsening, no gait abnormality, no bower incontinence  UPDATE May 7th 2024: She complains of lateral thigh area numbness, crawling sensation, no significant pain, despite previous lumbar decompression surgery, she complains of constant midline low back deep achy pain, denies radiating pain, she works  at a desk job,  Since starting Cymbalta, her neck pain, body achy pain has improved, tolerating it well, needing refill  MRI of the cervical without contrast report from [facility] November 16, 2021, abnormal tissue in the right side of the nasopharynx, recommend ENT evaluation and direct visualization to exclude tumor, Arnold-Chiari malformation, cerebellar tonsillar extent 8 mm below foramen magnum, slightly pointed, appears to have some crowding of the foramen magnum,  She was seen by ENT, no significant abnormality found  UPDATE Dec 14 2022: She works at a Information systems manager, 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  PHYSICAL EXAMNIATION:  Gen: NAD, conversant, well nourised, well groomed                     Cardiovascular: Regular rate rhythm, no peripheral edema, warm, nontender. Eyes: Conjunctivae clear without exudates or hemorrhage Neck: Supple, no carotid bruits. Pulmonary: Clear to auscultation bilaterally   NEUROLOGICAL EXAM:  MENTAL STATUS: Speech/cognition: Awake, alert, oriented to history taking and casual conversation CRANIAL NERVES: CN II: Visual fields are full to confrontation. Pupils are round equal and briskly  reactive to light. CN III, IV, VI: extraocular movement are normal. No ptosis. CN V: Facial sensation is intact to light touch CN VII: Face is symmetric with normal eye closure  CN VIII: Hearing is normal to causal conversation. CN IX, X: Phonation is normal. CN XI:  Head turning and shoulder shrug are intact  MOTOR: No significant muscle weakness noted  REFLEXES: Reflexes are 2+ and symmetric at the biceps, triceps, knees, and ankles. Plantar responses are flexor.  SENSORY: Decreased sensation at the left lateral thigh  COORDINATION: There is no trunk or limb dysmetria noted.  GAIT/STANCE: Need to push hard to get up from sitting position, steady, cautious  REVIEW OF SYSTEMS:  Full 14 system review of systems performed and notable only for as above All other review of systems were negative.   ALLERGIES: Allergies  Allergen Reactions   Codeine Itching   Morphine Itching    HOME MEDICATIONS: Current Outpatient Medications  Medication Sig Dispense Refill   albuterol (PROVENTIL) (2.5 MG/3ML) 0.083% nebulizer solution Take 3 mLs (2.5 mg total) by nebulization every 6 (six) hours as needed for wheezing or shortness of breath. 360 mL 3   albuterol (VENTOLIN HFA) 108 (90 Base) MCG/ACT inhaler Inhale 2 puffs into the lungs every 6 (six) hours as needed for wheezing or shortness of breath. 8 g 0   ALPRAZolam (XANAX) 1 MG tablet Take 1 tablet (1 mg total) by mouth at bedtime. 90 tablet 1   amitriptyline (ELAVIL) 50 MG tablet Take 0.5-1 tablets (25-50 mg total) by mouth at bedtime. 30 tablet 1   azelastine (ASTELIN) 0.1 % nasal spray Place 2 sprays into both nostrils 2 (two) times daily. Use in each nostril as directed (Patient taking differently: Place 2 sprays into both nostrils 2 (two) times daily as needed. Use in each nostril as directed) 30 mL 4   celecoxib (CELEBREX) 100 MG capsule Take 1 capsule (100 mg total) by mouth 2 (two) times daily. 60 capsule 0   cyclobenzaprine (FLEXERIL) 10 MG tablet Take 1 tablet (10 mg total) by mouth at bedtime. 90 tablet 0   DULoxetine (CYMBALTA) 60 MG capsule Take 1 capsule (60 mg total) by mouth daily. 90 capsule 3   esomeprazole (NEXIUM) 40 MG capsule TAKE ONE CAPSULE BY MOUTH ONCE DAILY. 90 capsule 0    ibuprofen (ADVIL) 800 MG tablet TAKE ONE TABLET BY MOUTH EVERY (8) HOURS AS NEEDED. 30 tablet 1   lamoTRIgine (LAMICTAL) 200 MG tablet Take 1 tab daily 30 tablet 1   Multiple Vitamins-Minerals (MULTIVITAMIN GUMMIES ADULT PO) Take 1 each by mouth 2 (two) times daily.     Na Sulfate-K Sulfate-Mg Sulf 17.5-3.13-1.6 GM/177ML SOLN As directed 354 mL 0   potassium chloride (KLOR-CON) 10 MEQ tablet TAKE ONE TABLET BY MOUTH ONCE DAILY. 90 tablet 0   traZODone (DESYREL) 150 MG tablet Take 1 tablet (150 mg total) by mouth at bedtime. 30 tablet 2   triamterene-hydrochlorothiazide (MAXZIDE) 75-50 MG tablet Take 1 tablet by mouth daily. 90 tablet 3   No current facility-administered medications for this visit.    PAST MEDICAL HISTORY: Past Medical History:  Diagnosis Date   Arthritis    Asthma    Phreesia 07/31/2019   Breast discharge    left spont white discharge x 6 months   Carpal tunnel syndrome    left, wears brace at night   Chronic back pain    GERD (gastroesophageal reflux disease)    Hyperlipidemia    Hypertension  Obesity     PAST SURGICAL HISTORY: Past Surgical History:  Procedure Laterality Date   ABDOMINAL HYSTERECTOMY  2003   partial   BREAST BIOPSY Left 2018   benign   CARPAL TUNNEL RELEASE Right    CESAREAN SECTION  1988   CESAREAN SECTION N/A    Phreesia 07/31/2019   CHOLECYSTECTOMY     COLONOSCOPY N/A 09/21/2012   Procedure: COLONOSCOPY;  Surgeon: West Bali, MD;  [facility];  Service: Endoscopy;  Laterality: N/A;  9:30   LAPAROSCOPIC GASTRIC SLEEVE RESECTION WITH HIATAL HERNIA REPAIR N/A 06/23/2014   Procedure: LAPAROSCOPIC LYSIS OF ADHESIONS, GASTRIC SLEEVE RESECTION AND UPPER ENDO;  Surgeon: Ovidio Kin, MD;  Location: [facility];  Service: General;  Laterality: N/A;   LAPAROSCOPIC LYSIS OF ADHESIONS  06/23/2014   Procedure: LAPAROSCOPIC LYSIS OF ADHESIONS;  Surgeon: Ovidio Kin, MD;  Location: [facility];  Service: General;;   LUMBAR FUSION  01/22/2014    L4 L5       DR Lovell Sheehan   other     Vocal chord polyp removal   ovarian tumor  2005   ovaries removed    SPINE SURGERY N/A    Phreesia 07/31/2019   TONSILLECTOMY     UPPER GI ENDOSCOPY  06/23/2014   Procedure: UPPER GI ENDOSCOPY;  Surgeon: Ovidio Kin, MD;  Location: [facility];  Service: General;;    FAMILY HISTORY: Family History  Problem Relation Age of Onset   Hypertension Mother    Hypertension Father    Hypertension Brother    Cancer Maternal Aunt    Cancer Maternal Uncle        liver   Cancer Paternal Uncle        bone cancer   Diabetes Maternal Grandmother    Colon cancer Neg Hx     SOCIAL HISTORY: Social History   Socioeconomic History   Marital status: Married    Spouse name: Not on file   Number of children: Not on file   Years of education: Not on file   Highest education level: Associate degree: occupational, Scientist, product/process development, or vocational program  Occupational History   Occupation: Company secretary: [employer]    Comment: [employer]   Tobacco Use   Smoking status: Former    Current packs/day: 0.00    Average packs/day: 0.8 packs/day for 17.0 years (12.8 ttl pk-yrs)    Types: Cigarettes    Start date: 01/04/1992    Quit date: 01/03/2009    Years since quitting: 13.9   Smokeless tobacco: Never  Vaping Use   Vaping status: Never Used  Substance and Sexual Activity   Alcohol use: Yes    Comment: occasionally   Drug use: No   Sexual activity: Yes  Other Topics Concern   Not on file  Social History Narrative   Not on file   Social Determinants of Health   Financial Resource Strain: Not on file  Food Insecurity: No Food Insecurity (04/20/2022)   Hunger Vital Sign    Worried About Running Out of Food in the Last Year: Never true    Ran Out of Food in the Last Year: Never true  Transportation Needs: No Transportation Needs (04/20/2022)   PRAPARE - Administrator, Civil Service (Medical): No    Lack of Transportation (Non-Medical):  No  Physical Activity: Unknown (04/20/2022)   Exercise Vital Sign    Days of Exercise per Week: 0 days    Minutes of Exercise  per Session: Not on file  Stress: Stress Concern Present (04/20/2022)   [employer] of Occupational Health - Occupational Stress Questionnaire    Feeling of Stress : Rather much  Social Connections: Socially Integrated (04/20/2022)   Social Connection and Isolation Panel [NHANES]    Frequency of Communication with Friends and Family: More than three times a week    Frequency of Social Gatherings with Friends and Family: Twice a week    Attends Religious Services: More than 4 times per year    Active Member of [employer] or Organizations: Yes    Attends Banker Meetings: 1 to 4 times per year    Marital Status: Married  Catering manager Violence: Not on file      Levert Feinstein, M.D. Ph.D.  [facility] [address], [address] [place], [place] [address] Ph: [phone] Fax: [phone]  CC:  Kerri Perches, MD [address] [place],  [place] [address]  Kerri Perches, MD

## 2022-12-15 ENCOUNTER — Telehealth: Payer: Self-pay | Admitting: Neurology

## 2022-12-15 NOTE — Telephone Encounter (Signed)
Sent to [facility], they schedule MRIs for the TEA plan. [phone]

## 2022-12-16 ENCOUNTER — Encounter (HOSPITAL_COMMUNITY): Payer: Self-pay

## 2022-12-16 ENCOUNTER — Ambulatory Visit (HOSPITAL_COMMUNITY): Payer: BC Managed Care – PPO | Admitting: Anesthesiology

## 2022-12-16 ENCOUNTER — Encounter (HOSPITAL_COMMUNITY)
Admission: RE | Disposition: A | Discharge status: Home / Self Care | Payer: Self-pay | Source: Home / Self Care | Attending: Internal Medicine

## 2022-12-16 ENCOUNTER — Other Ambulatory Visit: Payer: Self-pay

## 2022-12-16 ENCOUNTER — Ambulatory Visit (HOSPITAL_COMMUNITY)
Admission: RE | Admit: 2022-12-16 | Discharge: 2022-12-16 | Disposition: A | Discharge status: Home / Self Care | Payer: BC Managed Care – PPO | Attending: Internal Medicine | Admitting: Internal Medicine

## 2022-12-16 DIAGNOSIS — I10 Essential (primary) hypertension: Secondary | ICD-10-CM | POA: Diagnosis not present

## 2022-12-16 DIAGNOSIS — Z1211 Encounter for screening for malignant neoplasm of colon: Secondary | ICD-10-CM | POA: Insufficient documentation

## 2022-12-16 DIAGNOSIS — E669 Obesity, unspecified: Secondary | ICD-10-CM | POA: Insufficient documentation

## 2022-12-16 DIAGNOSIS — K648 Other hemorrhoids: Secondary | ICD-10-CM | POA: Insufficient documentation

## 2022-12-16 DIAGNOSIS — Z6841 Body Mass Index (BMI) 40.0 and over, adult: Secondary | ICD-10-CM | POA: Diagnosis not present

## 2022-12-16 DIAGNOSIS — Z87891 Personal history of nicotine dependence: Secondary | ICD-10-CM | POA: Diagnosis not present

## 2022-12-16 DIAGNOSIS — K635 Polyp of colon: Secondary | ICD-10-CM | POA: Insufficient documentation

## 2022-12-16 DIAGNOSIS — K219 Gastro-esophageal reflux disease without esophagitis: Secondary | ICD-10-CM | POA: Diagnosis not present

## 2022-12-16 DIAGNOSIS — D122 Benign neoplasm of ascending colon: Secondary | ICD-10-CM | POA: Diagnosis not present

## 2022-12-16 DIAGNOSIS — J45909 Unspecified asthma, uncomplicated: Secondary | ICD-10-CM | POA: Diagnosis not present

## 2022-12-16 HISTORY — PX: POLYPECTOMY: SHX5525

## 2022-12-16 HISTORY — PX: COLONOSCOPY WITH PROPOFOL: SHX5780

## 2022-12-16 SURGERY — COLONOSCOPY WITH PROPOFOL
Anesthesia: General

## 2022-12-16 MED ORDER — DEXMEDETOMIDINE HCL IN NACL 80 MCG/20ML IV SOLN
INTRAVENOUS | Status: AC
  Filled 2022-12-16: qty 20

## 2022-12-16 MED ORDER — DEXMEDETOMIDINE HCL IN NACL 80 MCG/20ML IV SOLN
INTRAVENOUS | Status: DC | PRN
  Administered 2022-12-16: 8 ug via INTRAVENOUS

## 2022-12-16 MED ORDER — PROPOFOL 500 MG/50ML IV EMUL
INTRAVENOUS | Status: DC | PRN
  Administered 2022-12-16: 150 ug/kg/min via INTRAVENOUS

## 2022-12-16 MED ORDER — PROPOFOL 10 MG/ML IV BOLUS
INTRAVENOUS | Status: DC | PRN
  Administered 2022-12-16: 100 mg via INTRAVENOUS
  Administered 2022-12-16: 50 mg via INTRAVENOUS

## 2022-12-16 MED ORDER — LACTATED RINGERS IV SOLN: INTRAVENOUS | Status: DC | PRN

## 2022-12-16 MED ORDER — LIDOCAINE HCL (PF) 2 % IJ SOLN
INTRAMUSCULAR | Status: DC | PRN
  Administered 2022-12-16: 50 mg via INTRADERMAL

## 2022-12-16 NOTE — Anesthesia Procedure Notes (Signed)
Date/Time: 12/16/2022 7:24 AM  Performed by: Julian Reil, CRNAPre-anesthesia Checklist: Patient identified, Emergency Drugs available, Suction available and Patient being monitored Patient Re-evaluated:Patient Re-evaluated prior to induction Oxygen Delivery Method: Simple face mask Induction Type: IV induction Placement Confirmation: positive ETCO2

## 2022-12-16 NOTE — Discharge Instructions (Addendum)
  Colonoscopy Discharge Instructions  Read the instructions outlined below and refer to this sheet in the next few weeks. These discharge instructions provide you with general information on caring for yourself after you leave the hospital. Your doctor may also give you specific instructions. While your treatment has been planned according to the most current medical practices available, unavoidable complications occasionally occur.   ACTIVITY You may resume your regular activity, but move at a slower pace for the next 24 hours.  Take frequent rest periods for the next 24 hours.  Walking will help get rid of the air and reduce the bloated feeling in your belly (abdomen).  No driving for 24 hours (because of the medicine (anesthesia) used during the test).   Do not sign any important legal documents or operate any machinery for 24 hours (because of the anesthesia used during the test).  NUTRITION Drink plenty of fluids.  You may resume your normal diet as instructed by your doctor.  Begin with a light meal and progress to your normal diet. Heavy or fried foods are harder to digest and may make you feel sick to your stomach (nauseated).  Avoid alcoholic beverages for 24 hours or as instructed.  MEDICATIONS You may resume your normal medications unless your doctor tells you otherwise.  WHAT YOU CAN EXPECT TODAY Some feelings of bloating in the abdomen.  Passage of more gas than usual.  Spotting of blood in your stool or on the toilet paper.  IF YOU HAD POLYPS REMOVED DURING THE COLONOSCOPY: No aspirin products for 7 days or as instructed.  No alcohol for 7 days or as instructed.  Eat a soft diet for the next 24 hours.  FINDING OUT THE RESULTS OF YOUR TEST Not all test results are available during your visit. If your test results are not back during the visit, make an appointment with your caregiver to find out the results. Do not assume everything is normal if you have not heard from your  caregiver or the medical facility. It is important for you to follow up on all of your test results.  SEEK IMMEDIATE MEDICAL ATTENTION IF: You have more than a spotting of blood in your stool.  Your belly is swollen (abdominal distention).  You are nauseated or vomiting.  You have a temperature over 101.  You have abdominal pain or discomfort that is severe or gets worse throughout the day.   Your colonoscopy revealed 2 polyp(s) which I removed successfully. Await pathology results, my office will contact you. I recommend repeating colonoscopy in 7-10 years for surveillance purposes. Otherwise follow up with GI as needed   I hope you have a great rest of your week!  Hennie Duos. Marletta Lor, D.O. Gastroenterology and Hepatology [facility]

## 2022-12-16 NOTE — H&P (Signed)
Primary Care Physician:  Kerri Perches, MD Primary Gastroenterologist:  Dr. Marletta Lor  Pre-Procedure History & Physical: HPI:  Stephanie Cannon is a 52 y.o. female is here for a colonoscopy for colon cancer screening purposes.  Past Medical History:  Diagnosis Date   Arthritis    Asthma    Phreesia 07/31/2019   Breast discharge    left spont white discharge x 6 months   Carpal tunnel syndrome    left, wears brace at night   Chronic back pain    GERD (gastroesophageal reflux disease)    Hyperlipidemia    Hypertension    Obesity     Past Surgical History:  Procedure Laterality Date   ABDOMINAL HYSTERECTOMY  2003   partial   BREAST BIOPSY Left 2018   benign   CARPAL TUNNEL RELEASE Right    CESAREAN SECTION  1988   CESAREAN SECTION N/A    Phreesia 07/31/2019   CHOLECYSTECTOMY     COLONOSCOPY N/A 09/21/2012   Procedure: COLONOSCOPY;  Surgeon: West Bali, MD;  [facility];  Service: Endoscopy;  Laterality: N/A;  9:30   LAPAROSCOPIC GASTRIC SLEEVE RESECTION WITH HIATAL HERNIA REPAIR N/A 06/23/2014   Procedure: LAPAROSCOPIC LYSIS OF ADHESIONS, GASTRIC SLEEVE RESECTION AND UPPER ENDO;  Surgeon: Ovidio Kin, MD;  [facility];  Service: General;  Laterality: N/A;   LAPAROSCOPIC LYSIS OF ADHESIONS  06/23/2014   Procedure: LAPAROSCOPIC LYSIS OF ADHESIONS;  Surgeon: Ovidio Kin, MD;  [facility];  Service: General;;   LUMBAR FUSION  01/22/2014   L4 L5       DR Lovell Sheehan   other     Vocal chord polyp removal   ovarian tumor  2005   ovaries removed    SPINE SURGERY N/A    Phreesia 07/31/2019   TONSILLECTOMY     UPPER GI ENDOSCOPY  06/23/2014   Procedure: UPPER GI ENDOSCOPY;  Surgeon: Ovidio Kin, MD;  Location: [facility];  Service: General;;    Prior to Admission medications   Medication Sig Start Date End Date Taking? Authorizing Provider  albuterol (PROVENTIL) (2.5 MG/3ML) 0.083% nebulizer solution Take 3 mLs (2.5 mg total) by nebulization every 6 (six)  hours as needed for wheezing or shortness of breath. 12/05/22  Yes Kerri Perches, MD  albuterol (VENTOLIN HFA) 108 (90 Base) MCG/ACT inhaler Inhale 2 puffs into the lungs every 6 (six) hours as needed for wheezing or shortness of breath. 04/15/22  Yes Kerri Perches, MD  ALPRAZolam Prudy Feeler) 1 MG tablet Take 1 tablet (1 mg total) by mouth at bedtime. 10/24/22 04/22/23 Yes Kerri Perches, MD  amitriptyline (ELAVIL) 50 MG tablet Take 0.5-1 tablets (25-50 mg total) by mouth at bedtime. 12/07/22  Yes Arfeen, Phillips Grout, MD  celecoxib (CELEBREX) 100 MG capsule Take 1 capsule (100 mg total) by mouth 2 (two) times daily. 11/09/22  Yes Eber Hong, MD  DULoxetine (CYMBALTA) 60 MG capsule Take 1 capsule (60 mg total) by mouth daily. 05/10/22  Yes Levert Feinstein, MD  esomeprazole (NEXIUM) 40 MG capsule TAKE ONE CAPSULE BY MOUTH ONCE DAILY. 08/19/22  Yes Kerri Perches, MD  ibuprofen (ADVIL) 800 MG tablet TAKE ONE TABLET BY MOUTH EVERY (8) HOURS AS NEEDED. 10/06/22  Yes Kerri Perches, MD  lamoTRIgine (LAMICTAL) 200 MG tablet Take 1 tab daily 12/07/22  Yes Arfeen, Phillips Grout, MD  Multiple Vitamins-Minerals (MULTIVITAMIN GUMMIES ADULT PO) Take 1 each by mouth 2 (two) times daily.   Yes [provider]  potassium chloride (KLOR-CON) 10 MEQ tablet TAKE ONE TABLET BY MOUTH ONCE DAILY. 08/19/22  Yes Kerri Perches, MD  traZODone (DESYREL) 150 MG tablet Take 1 tablet (150 mg total) by mouth at bedtime. 10/11/22  Yes Arfeen, Phillips Grout, MD  triamterene-hydrochlorothiazide (MAXZIDE) 75-50 MG tablet Take 1 tablet by mouth daily. 01/31/22  Yes Kerri Perches, MD  azelastine (ASTELIN) 0.1 % nasal spray Place 2 sprays into both nostrils 2 (two) times daily. Use in each nostril as directed Patient taking differently: Place 2 sprays into both nostrils 2 (two) times daily as needed. Use in each nostril as directed 01/04/22   Kerri Perches, MD  cyclobenzaprine (FLEXERIL) 10 MG tablet Take 1 tablet (10 mg  total) by mouth at bedtime. 11/30/22   Kerri Perches, MD  Na Sulfate-K Sulfate-Mg Sulf 17.5-3.13-1.6 GM/177ML SOLN As directed 10/18/22   Lanelle Bal, DO    Allergies as of 10/18/2022 - Review Complete 10/11/2022  Allergen Reaction Noted   Codeine Itching 01/23/2008   Morphine Itching 01/23/2008    Family History  Problem Relation Age of Onset   Hypertension Mother    Hypertension Father    Hypertension Brother    Cancer Maternal Aunt    Cancer Maternal Uncle        liver   Cancer Paternal Uncle        bone cancer   Diabetes Maternal Grandmother    Colon cancer Neg Hx     Social History   Socioeconomic History   Marital status: Married    Spouse name: Not on file   Number of children: Not on file   Years of education: Not on file   Highest education level: Associate degree: occupational, Scientist, product/process development, or vocational program  Occupational History   Occupation: Company secretary: [employer]    Comment: [employer]   Tobacco Use   Smoking status: Former    Current packs/day: 0.00    Average packs/day: 0.8 packs/day for 17.0 years (12.8 ttl pk-yrs)    Types: Cigarettes    Start date: 01/04/1992    Quit date: 01/03/2009    Years since quitting: 13.9   Smokeless tobacco: Never  Vaping Use   Vaping status: Never Used  Substance and Sexual Activity   Alcohol use: Yes    Comment: occasionally   Drug use: No   Sexual activity: Yes  Other Topics Concern   Not on file  Social History Narrative   Not on file   Social Drivers of Health   Financial Resource Strain: Not on file  Food Insecurity: No Food Insecurity (04/20/2022)   Hunger Vital Sign    Worried About Running Out of Food in the Last Year: Never true    Ran Out of Food in the Last Year: Never true  Transportation Needs: No Transportation Needs (04/20/2022)   PRAPARE - Administrator, Civil Service (Medical): No    Lack of Transportation (Non-Medical): No  Physical Activity:  Unknown (04/20/2022)   Exercise Vital Sign    Days of Exercise per Week: 0 days    Minutes of Exercise per Session: Not on file  Stress: Stress Concern Present (04/20/2022)   [employer] of Occupational Health - Occupational Stress Questionnaire    Feeling of Stress : Rather much  Social Connections: Socially Integrated (04/20/2022)   Social Connection and Isolation Panel [NHANES]    Frequency of Communication with Friends and Family: More than three times a  week    Frequency of Social Gatherings with Friends and Family: Twice a week    Attends Religious Services: More than 4 times per year    Active Member of [employer] or Organizations: Yes    Attends Banker Meetings: 1 to 4 times per year    Marital Status: Married  Catering manager Violence: Not on file    Review of Systems: See HPI, otherwise negative ROS  Physical Exam: Vital signs in last 24 hours: Temp:  [98.3 F (36.8 C)] 98.3 F (36.8 C) (12/13 0706) Pulse Rate:  [81] 81 (12/13 0706) BP: (129)/(78) 129/78 (12/13 0706) SpO2:  [100 %] 100 % (12/13 0706) Weight:  [94.8 kg] 94.8 kg (12/13 0706)   General:   Alert,  Well-developed, well-nourished, pleasant and cooperative in NAD Head:  Normocephalic and atraumatic. Eyes:  Sclera clear, no icterus.   Conjunctiva pink. Ears:  Normal auditory acuity. Nose:  No deformity, discharge,  or lesions. Msk:  Symmetrical without gross deformities. Normal posture. Extremities:  Without clubbing or edema. Neurologic:  Alert and  oriented x4;  grossly normal neurologically. Skin:  Intact without significant lesions or rashes. Psych:  Alert and cooperative. Normal mood and affect.  Impression/Plan: Stephanie Cannon is here for a colonoscopy to be performed for colon cancer screening purposes.  The risks of the procedure including infection, bleed, or perforation as well as benefits, limitations, alternatives and imponderables have been reviewed with the patient. Questions  have been answered. All parties agreeable.

## 2022-12-16 NOTE — Op Note (Signed)
[facility] Patient Name: Stephanie Cannon Procedure Date: 12/16/2022 7:01 AM MRN: [id number] Date of Birth: 05/24/1970 Attending MD: Hennie Duos. Marletta Lor , [place], [id number] CSN: [id number] Age: 52 Admit Type: Outpatient Procedure:                Colonoscopy Indications:              Screening for colorectal malignant neoplasm Providers:                Hennie Duos. Marletta Lor, DO, Angelica Ran, Lennice Sites                            Technician, Technician Referring MD:              Medicines:                See the Anesthesia note for documentation of the                            administered medications Complications:            No immediate complications. Estimated Blood Loss:     Estimated blood loss was minimal. Procedure:                Pre-Anesthesia Assessment:                           - The anesthesia plan was to use monitored                            anesthesia care (MAC).                           After obtaining informed consent, the colonoscope                            was passed under direct vision. Throughout the                            procedure, the patient's blood pressure, pulse, and                            oxygen saturations were monitored continuously. The                            PCF-HQ190L ([id number]) scope was introduced through                            the anus and advanced to the the cecum, identified                            by appendiceal orifice and ileocecal valve. The                            colonoscopy was performed without difficulty. The                            patient tolerated the procedure well.  The quality                            of the bowel preparation was evaluated using the                            BBPS [facility] Bowel Preparation Scale) with scores                            of: Right Colon = 3, Transverse Colon = 3 and Left                            Colon = 3 (entire mucosa seen well with no residual                             staining, small fragments of stool or opaque                            liquid). The total BBPS score equals 9. Scope In: 7:29:29 AM Scope Out: 7:44:40 AM Scope Withdrawal Time: 0 hours 12 minutes 34 seconds  Total Procedure Duration: 0 hours 15 minutes 11 seconds  Findings:      Non-bleeding internal hemorrhoids were found during endoscopy.      A 6 mm polyp was found in the ascending colon. The polyp was sessile.       The polyp was removed with a cold snare. Resection and retrieval were       complete.      A 4 mm polyp was found in the sigmoid colon. The polyp was sessile. The       polyp was removed with a cold snare. Resection and retrieval were       complete.      The exam was otherwise without abnormality. Impression:               - Non-bleeding internal hemorrhoids.                           - One 6 mm polyp in the ascending colon, removed                            with a cold snare. Resected and retrieved.                           - One 4 mm polyp in the sigmoid colon, removed with                            a cold snare. Resected and retrieved.                           - The examination was otherwise normal. Moderate Sedation:      Per Anesthesia Care Recommendation:           - Patient has a contact number available for  emergencies. The signs and symptoms of potential                            delayed complications were discussed with the                            patient. Return to normal activities tomorrow.                            Written discharge instructions were provided to the                            patient.                           - Resume previous diet.                           - Continue present medications.                           - Await pathology results.                           - Repeat colonoscopy in 7-10 years for surveillance.                           - Return to GI clinic PRN. Procedure Code(s):         --- Professional ---                           [phone], Colonoscopy, flexible; with removal of                            tumor(s), polyp(s), or other lesion(s) by snare                            technique Diagnosis Code(s):        --- Professional ---                           Z12.11, Encounter for screening for malignant                            neoplasm of colon                           D12.2, Benign neoplasm of ascending colon                           D12.5, Benign neoplasm of sigmoid colon                           K64.8, Other hemorrhoids CPT copyright 2022 American [employer]. All rights reserved. The codes documented in this report are preliminary and upon coder review may  be revised to meet current compliance requirements. Hennie Duos. Marletta Lor, DO Leonette Most  Merrilyn Puma, DO 12/16/2022 7:49:02 AM This report has been signed electronically. Number of Addenda: 0

## 2022-12-16 NOTE — Transfer of Care (Signed)
Immediate Anesthesia Transfer of Care Note  Patient: Stephanie Cannon  Procedure(s) Performed: COLONOSCOPY WITH PROPOFOL POLYPECTOMY  Patient Location: Endoscopy Unit  Anesthesia Type:General  Level of Consciousness: awake, alert , and oriented  Airway & Oxygen Therapy: Patient Spontanous Breathing  Post-op Assessment: Report given to RN and Post -op Vital signs reviewed and stable  Post vital signs: Reviewed and stable  Last Vitals:  Vitals Value Taken Time  BP    Temp    Pulse    Resp    SpO2      Last Pain:  Vitals:   12/16/22 0725  TempSrc:   PainSc: 0-No pain      Patients Stated Pain Goal: 6 (12/16/22 0706)  Complications: No notable events documented.

## 2022-12-16 NOTE — Anesthesia Postprocedure Evaluation (Signed)
Anesthesia Post Note  Patient: Stephanie Cannon  Procedure(s) Performed: COLONOSCOPY WITH PROPOFOL POLYPECTOMY  Patient location during evaluation: Phase II Anesthesia Type: General Level of consciousness: awake Pain management: pain level controlled Vital Signs Assessment: post-procedure vital signs reviewed and stable Respiratory status: spontaneous breathing and respiratory function stable Cardiovascular status: blood pressure returned to baseline and stable Postop Assessment: no headache and no apparent nausea or vomiting Anesthetic complications: no Comments: Late entry   No notable events documented.   Last Vitals:  Vitals:   12/16/22 0706 12/16/22 0746  BP: 129/78 111/71  Pulse: 81 82  Resp:  20  Temp: 36.8 C 36.6 C  SpO2: 100% 100%    Last Pain:  Vitals:   12/16/22 0746  TempSrc: Oral  PainSc: 0-No pain                 [name]

## 2022-12-16 NOTE — Anesthesia Preprocedure Evaluation (Addendum)
Anesthesia Evaluation  Patient identified by MRN, date of birth, ID band Patient awake    Reviewed: Allergy & Precautions, H&P , NPO status , Patient's Chart, lab work & pertinent test results, reviewed documented beta blocker date and time   History of Anesthesia Complications (+) DIFFICULT AIRWAY and history of anesthetic complications  Airway Mallampati: II  TM Distance: >3 FB Neck ROM: full    Dental no notable dental hx.    Pulmonary asthma , former smoker   Pulmonary exam normal breath sounds clear to auscultation       Cardiovascular Exercise Tolerance: Good hypertension,  Rhythm:regular Rate:Normal     Neuro/Psych  PSYCHIATRIC DISORDERS Anxiety Depression     Neuromuscular disease negative neurological ROS  negative psych ROS   GI/Hepatic negative GI ROS, Neg liver ROS,GERD  ,,  Endo/Other    Class 3 obesity  Renal/GU negative Renal ROS  negative genitourinary   Musculoskeletal   Abdominal   Peds  Hematology negative hematology ROS (+)   Anesthesia Other Findings   Reproductive/Obstetrics negative OB ROS                             Anesthesia Physical Anesthesia Plan  ASA: 3  Anesthesia Plan: General   Post-op Pain Management:    Induction:   PONV Risk Score and Plan: Propofol infusion  Airway Management Planned:   Additional Equipment:   Intra-op Plan:   Post-operative Plan:   Informed Consent: I have reviewed the patients History and Physical, chart, labs and discussed the procedure including the risks, benefits and alternatives for the proposed anesthesia with the patient or authorized representative who has indicated his/her understanding and acceptance.     Dental Advisory Given  Plan Discussed with: CRNA  Anesthesia Plan Comments:        Anesthesia Quick Evaluation

## 2022-12-19 ENCOUNTER — Encounter: Payer: Self-pay | Admitting: Family Medicine

## 2022-12-19 LAB — SURGICAL PATHOLOGY

## 2022-12-20 ENCOUNTER — Other Ambulatory Visit: Payer: Self-pay | Admitting: Family Medicine

## 2022-12-20 ENCOUNTER — Telehealth: Payer: Self-pay | Admitting: Family Medicine

## 2022-12-20 MED ORDER — POTASSIUM CHLORIDE CRYS ER 10 MEQ PO TBCR: 10.0000 meq | EXTENDED_RELEASE_TABLET | Freq: Two times a day (BID) | ORAL | 5 refills | Status: DC

## 2022-12-20 NOTE — Telephone Encounter (Signed)
Pls order and let pt know she needs chem 7 and eGFr in 4 weeks , dx htn and low potassium, I increased her potassium to twice daily  Also order for her March visit get 3 to 5 days before fasting lipid, cmp and EGfr and CBc  Offer and send info re low fat diet , she is concerned about her cholesterol  ?? Pls ask, you may communicate with her vis My Chart

## 2022-12-20 NOTE — Progress Notes (Signed)
Pt concerned about recent labs will order rept lchm 7 in 4 weeks , an needs fasting lipid panel prior to March follow up

## 2022-12-21 ENCOUNTER — Other Ambulatory Visit: Payer: Self-pay

## 2022-12-21 DIAGNOSIS — I10 Essential (primary) hypertension: Secondary | ICD-10-CM

## 2022-12-21 DIAGNOSIS — E785 Hyperlipidemia, unspecified: Secondary | ICD-10-CM

## 2022-12-21 NOTE — Telephone Encounter (Signed)
Patient aware labs mailed to patient

## 2022-12-23 ENCOUNTER — Encounter (HOSPITAL_COMMUNITY): Payer: Self-pay | Admitting: Internal Medicine

## 2022-12-26 ENCOUNTER — Other Ambulatory Visit (HOSPITAL_COMMUNITY): Payer: Self-pay | Admitting: Psychiatry

## 2022-12-26 DIAGNOSIS — F39 Unspecified mood [affective] disorder: Secondary | ICD-10-CM

## 2022-12-26 DIAGNOSIS — F411 Generalized anxiety disorder: Secondary | ICD-10-CM

## 2022-12-29 ENCOUNTER — Other Ambulatory Visit: Payer: Self-pay | Admitting: Emergency Medicine

## 2022-12-30 ENCOUNTER — Other Ambulatory Visit: Payer: Self-pay

## 2022-12-30 DIAGNOSIS — I10 Essential (primary) hypertension: Secondary | ICD-10-CM

## 2022-12-30 NOTE — Telephone Encounter (Signed)
Patient aware of labs ordered.

## 2023-01-03 ENCOUNTER — Encounter: Payer: Self-pay | Admitting: Family Medicine

## 2023-01-05 ENCOUNTER — Other Ambulatory Visit: Payer: Self-pay | Admitting: Family Medicine

## 2023-01-05 MED ORDER — BUSPIRONE HCL 5 MG PO TABS: 5.0000 mg | ORAL_TABLET | Freq: Three times a day (TID) | ORAL | 1 refills | Status: DC

## 2023-01-05 MED ORDER — CELECOXIB 100 MG PO CAPS: 100.0000 mg | ORAL_CAPSULE | Freq: Two times a day (BID) | ORAL | 2 refills | Status: DC

## 2023-01-05 NOTE — Addendum Note (Signed)
 Addended by: Kerri Perches on: 01/05/2023 11:33 AM   Modules accepted: Orders

## 2023-01-18 ENCOUNTER — Encounter: Payer: Self-pay | Admitting: Family Medicine

## 2023-01-18 ENCOUNTER — Telehealth: Payer: Self-pay | Admitting: Neurology

## 2023-01-18 NOTE — Telephone Encounter (Signed)
 Called spoke to pt and stated:   I will see her again in Feb, will address her needs then, also will more data to support her request of accomodation     Pt voiced gratitude and understanding and is looking forward to feb visit

## 2023-01-18 NOTE — Telephone Encounter (Signed)
 Pt reports that the accomodation Dr Gracie Lav wrote for her to be allowed to get up every 10 mins while at work has expired, pt is asking for another, please call.

## 2023-01-18 NOTE — Telephone Encounter (Signed)
 I will see her again in Feb, will address her needs then, also will more data to support her request of accomodation

## 2023-01-23 ENCOUNTER — Other Ambulatory Visit: Payer: Self-pay | Admitting: Family Medicine

## 2023-01-23 ENCOUNTER — Other Ambulatory Visit (HOSPITAL_COMMUNITY): Payer: Self-pay | Admitting: Psychiatry

## 2023-01-23 DIAGNOSIS — F4321 Adjustment disorder with depressed mood: Secondary | ICD-10-CM

## 2023-01-23 DIAGNOSIS — F411 Generalized anxiety disorder: Secondary | ICD-10-CM

## 2023-01-23 DIAGNOSIS — F5101 Primary insomnia: Secondary | ICD-10-CM

## 2023-01-23 DIAGNOSIS — F39 Unspecified mood [affective] disorder: Secondary | ICD-10-CM

## 2023-01-25 ENCOUNTER — Encounter: Payer: Self-pay | Admitting: Family Medicine

## 2023-01-30 ENCOUNTER — Encounter (HOSPITAL_COMMUNITY): Payer: Self-pay

## 2023-01-30 ENCOUNTER — Ambulatory Visit (INDEPENDENT_AMBULATORY_CARE_PROVIDER_SITE_OTHER): Payer: 59 | Admitting: Psychiatry

## 2023-01-30 DIAGNOSIS — M79662 Pain in left lower leg: Secondary | ICD-10-CM | POA: Diagnosis not present

## 2023-01-30 DIAGNOSIS — F39 Unspecified mood [affective] disorder: Secondary | ICD-10-CM | POA: Diagnosis not present

## 2023-01-30 NOTE — Progress Notes (Signed)
IN-PERSON  THERAPIST PROGRESS NOTE  Session Time: Monday 01/30/2023 9:02 AM - 9:53 AM   Participation Level: Active  Behavioral Response: CasualAlertless anxious, less depressed   Type of Therapy: Individual Therapy  Treatment Goals addressed:   Reduce episodes of irritability and lashing out to 1 x every 2 weeks for 30 days per pt's report      Pt will learn and implement relaxation techniques, will practice a relaxation technique daily    ProgressTowards Goals: Initial   Interventions: Supportive, CBT   Summary: Stephanie Cannon is a 53 y.o. female who is referred for services by PCP Dr. Lodema Cannon due to pt experiencing symptoms of anxiety. Pt sees psychiatrist Dr. Lolly Cannon for medication management. Pt denies any psychiatric hospitalizations. Pt participated in teleheath counseling with son last year. Pt reports experiencing grief and loss issues related to the death  of a cousin who was like a sister in July 2024. She states she can't hardly talk about her without becoming very emotional. She also reports stress related to recently being injured on the job. She says she has been made to feel like "I am the blame" by her employer. She reports financial stress. She reports additional stress regarding conflict between son and husband resulting in them not talking to leach other since July 2024. She also worries about her mother as she suspects mother may have dementia. Pt's current symptoms include sadness, worry, tearfulness,  difficulty concentration, fatigue, irritability, sleep difficulty, and restlessness.    Patient last was seen about 6 weeks ago.  She reports doing better since last session. Per her report, she managed Christmas fairly well. She continues to miss her deceased friend but was able to acknowledge her feelings and discuss with friends and family. She reports decreased stress regarding finances as she began receiving worker's compensation about 2 weeks ago. She is attending  physical therapy 2x per week and is hopeful about returning to work in the near future. She reports continued marital stress and reports a recent conflict with husband resulting in pt threatening to leave. However, she and husband have discussed and pt has encouraged him to see help for his issues. She reports she is trying to learn to control her response to him and think before she reacts. She also wants to improve in this area regarding interactions with others including people at  her job.   Suicidal/Homicidal: Nowithout intent/plan  Therapist Response: Reviewed symptoms,  discussed stressors, facilitated expression of thoughts and feelings, validated feelings, praised and reinforced pt's increased awareness and her efforts to respond versus react, developed treatment plan, sent pt plan and signature page via my chart, provided pt with psychoeducation on stress response, discussed rationale for and assisted pt practice deep breathing to trigger relaxation response, developed plan with pt to practice deep breathing 2 x per day, provided pt with handout  Plan: Return again in 2 weeks.  Diagnosis: Mood disorder  Collaboration of Care: Psychiatrist AEB patient sees psychiatrist Dr. Lolly Cannon for medication management  Patient/Guardian was advised Release of Information must be obtained prior to any record release in order to collaborate their care with an outside provider. Patient/Guardian was advised if they have not already done so to contact the registration department to sign all necessary forms in order for [place] to release information regarding their care.   Consent: Patient/Guardian gives verbal consent for treatment and assignment of benefits for services provided during this visit. Patient/Guardian expressed understanding and agreed to proceed.   Stephanie Cannon E  Solon Augusta, LCSW 01/30/2023

## 2023-02-03 ENCOUNTER — Other Ambulatory Visit (HOSPITAL_COMMUNITY): Payer: Self-pay | Admitting: Otolaryngology

## 2023-02-03 ENCOUNTER — Ambulatory Visit (HOSPITAL_COMMUNITY)
Admission: RE | Admit: 2023-02-03 | Discharge: 2023-02-03 | Disposition: A | Discharge status: Home / Self Care | Payer: BC Managed Care – PPO | Source: Ambulatory Visit | Attending: Cardiology | Admitting: Cardiology

## 2023-02-03 DIAGNOSIS — M79662 Pain in left lower leg: Secondary | ICD-10-CM | POA: Insufficient documentation

## 2023-02-05 ENCOUNTER — Other Ambulatory Visit: Payer: Self-pay | Admitting: Family Medicine

## 2023-02-05 ENCOUNTER — Other Ambulatory Visit (HOSPITAL_COMMUNITY): Payer: Self-pay | Admitting: Psychiatry

## 2023-02-05 DIAGNOSIS — F411 Generalized anxiety disorder: Secondary | ICD-10-CM

## 2023-02-05 DIAGNOSIS — F5101 Primary insomnia: Secondary | ICD-10-CM

## 2023-02-05 DIAGNOSIS — F39 Unspecified mood [affective] disorder: Secondary | ICD-10-CM

## 2023-02-09 ENCOUNTER — Telehealth (HOSPITAL_BASED_OUTPATIENT_CLINIC_OR_DEPARTMENT_OTHER): Payer: 59 | Admitting: Psychiatry

## 2023-02-09 ENCOUNTER — Encounter (HOSPITAL_COMMUNITY): Payer: Self-pay | Admitting: Psychiatry

## 2023-02-09 VITALS — Wt 209.0 lb

## 2023-02-09 DIAGNOSIS — F5101 Primary insomnia: Secondary | ICD-10-CM | POA: Diagnosis not present

## 2023-02-09 DIAGNOSIS — F4321 Adjustment disorder with depressed mood: Secondary | ICD-10-CM | POA: Diagnosis not present

## 2023-02-09 DIAGNOSIS — F39 Unspecified mood [affective] disorder: Secondary | ICD-10-CM | POA: Diagnosis not present

## 2023-02-09 DIAGNOSIS — F411 Generalized anxiety disorder: Secondary | ICD-10-CM

## 2023-02-09 MED ORDER — LAMOTRIGINE 200 MG PO TABS: ORAL_TABLET | ORAL | 0 refills | Status: DC

## 2023-02-09 MED ORDER — AMITRIPTYLINE HCL 75 MG PO TABS: 75.0000 mg | ORAL_TABLET | Freq: Every day | ORAL | 0 refills | Status: DC

## 2023-02-09 NOTE — Progress Notes (Signed)
 [facility] MD Virtual Progress Note   Patient Location: In Car Provider Location: Office  I connect with patient by video and verified that I am speaking with correct person by using two identifiers. I discussed the limitations of evaluation and management by telemedicine and the availability of in person appointments. I also discussed with the patient that there may be a patient responsible charge related to this service. The patient expressed understanding and agreed to proceed.  Stephanie Cannon [id number] 53 y.o.  02/09/2023 3:25 PM  History of Present Illness:  Patient is evaluated by video session.  On the last visit we increase Lamictal  and tried amitriptyline .  She noticed improvement in her sleep and her mood but still there are days when she struggled with depression, anxiety and crying.  She reported Christmas was difficult because of the cousin who died last year.  She was able to connect with Winton Rubinstein and her plan is to continue therapy.  Recently she had a colonoscopy which went well.  Patient is seeing neurology and now she is scheduled for nerve conduction and EMG.  She still have back pain and shoulder pain.  Patient told finally worksmen com insurance helping and she is able to see specialist and able to get test.  She is taking Cymbalta  prescribed by Dr. Onita.  She has no rash or any itching from the Lamictal .  She like to go up on the amitriptyline  because still struggle with anxiety and insomnia.  She denies any hallucination, paranoia, suicidal thoughts.  She denies any aggression or violence.  She lives with her husband but sometime she feel he is not as supportive as he supposed to.  Patient is not back to work since September 7.  Past Psychiatric History: No h/o inpatient treatment or suicidal attempt.  Saw Dr. Okey twice but no further follow-ups.  Saw therapist but reported to help her son. Tried Prozac  temazepam , Ambien, melatonin and recently BuSpar . No  history of psychosis.   Outpatient Encounter Medications as of 02/09/2023  Medication Sig   albuterol  (PROVENTIL ) (2.5 MG/3ML) 0.083% nebulizer solution Take 3 mLs (2.5 mg total) by nebulization every 6 (six) hours as needed for wheezing or shortness of breath.   albuterol  (VENTOLIN  HFA) 108 (90 Base) MCG/ACT inhaler Inhale 2 puffs into the lungs every 6 (six) hours as needed for wheezing or shortness of breath.   ALPRAZolam  (XANAX ) 1 MG tablet Take 1 tablet (1 mg total) by mouth at bedtime.   amitriptyline  (ELAVIL ) 75 MG tablet Take 1 tablet (75 mg total) by mouth at bedtime.   azelastine  (ASTELIN ) 0.1 % nasal spray Place 2 sprays into both nostrils 2 (two) times daily. Use in each nostril as directed (Patient taking differently: Place 2 sprays into both nostrils 2 (two) times daily as needed. Use in each nostril as directed)   busPIRone  (BUSPAR ) 5 MG tablet Take 1 tablet (5 mg total) by mouth 3 (three) times daily.   celecoxib  (CELEBREX ) 100 MG capsule Take 1 capsule (100 mg total) by mouth 2 (two) times daily.   cyclobenzaprine  (FLEXERIL ) 10 MG tablet Take 1 tablet (10 mg total) by mouth at bedtime.   DULoxetine  (CYMBALTA ) 60 MG capsule Take 1 capsule (60 mg total) by mouth daily.   esomeprazole  (NEXIUM ) 40 MG capsule TAKE ONE CAPSULE BY MOUTH ONCE DAILY.   ibuprofen  (ADVIL ) 800 MG tablet TAKE ONE TABLET BY MOUTH EVERY (8) HOURS AS NEEDED.   lamoTRIgine  (LAMICTAL ) 200 MG tablet Take 1 tab  daily   Multiple Vitamins-Minerals (MULTIVITAMIN GUMMIES ADULT PO) Take 1 each by mouth 2 (two) times daily.   potassium chloride  (KLOR-CON  M) 10 MEQ tablet Take 1 tablet (10 mEq total) by mouth 2 (two) times daily.   triamterene -hydrochlorothiazide  (MAXZIDE ) 75-50 MG tablet TAKE ONE TABLET BY MOUTH ONCE DAILY.   [DISCONTINUED] amitriptyline  (ELAVIL ) 50 MG tablet Take 0.5-1 tablets (25-50 mg total) by mouth at bedtime.   [DISCONTINUED] lamoTRIgine  (LAMICTAL ) 200 MG tablet Take 1 tab daily   [DISCONTINUED]  traZODone  (DESYREL ) 150 MG tablet Take 1 tablet (150 mg total) by mouth at bedtime.   No facility-administered encounter medications on file as of 02/09/2023.    Recent Results (from the past 2160 hours)  Basic Metabolic Panel (BMET)     Status: Abnormal   Collection Time: 12/08/22  1:26 PM  Result Value Ref Range   Sodium 136 135 - 145 mmol/L   Potassium 3.3 (L) 3.5 - 5.1 mmol/L   Chloride 99 98 - 111 mmol/L   CO2 27 22 - 32 mmol/L   Glucose, Bld 95 70 - 99 mg/dL    Comment: Glucose reference range applies only to samples taken after fasting for at least 8 hours.   BUN 21 (H) 6 - 20 mg/dL   Creatinine, Ser 9.14 0.44 - 1.00 mg/dL   Calcium 9.1 8.9 - 89.6 mg/dL   GFR, Estimated >39 >39 mL/min    Comment: (NOTE) Calculated using the CKD-EPI Creatinine Equation (2021)    Anion gap 10 5 - 15    Comment: Performed at [facility], [address] [place], [place] [address]  Vitamin B12     Status: None   Collection Time: 12/08/22  1:28 PM  Result Value Ref Range   Vitamin B-12 711 180 - 914 pg/mL    Comment: (NOTE) This assay is not validated for testing neonatal or myeloproliferative syndrome specimens for Vitamin B12 levels. Performed at [facility], [address], [place], [place] [address]   Lipid panel     Status: Abnormal   Collection Time: 12/08/22  1:28 PM  Result Value Ref Range   Cholesterol 205 (H) 0 - 200 mg/dL   Triglycerides 26 <849 mg/dL   HDL 77 >59 mg/dL   Total CHOL/HDL Ratio 2.7 RATIO   VLDL 5 0 - 40 mg/dL   LDL Cholesterol 876 (H) 0 - 99 mg/dL    Comment:        Total Cholesterol/HDL:CHD Risk Coronary Heart Disease Risk Table                     Men   Women  1/2 Average Risk   3.4   3.3  Average Risk       5.0   4.4  2 X Average Risk   9.6   7.1  3 X Average Risk  23.4   11.0        Use the calculated Patient Ratio above and the CHD Risk Table to determine the patient's CHD Risk.        ATP III CLASSIFICATION (LDL):  <100     mg/dL   Optimal   899-870  mg/dL   Near or Above                    Optimal  130-159  mg/dL   Borderline  839-810  mg/dL   High  >809     mg/dL   Very High Performed at [facility], [address] [place], [place] [address]   Surgical pathology     Status: None   Collection Time: 12/16/22  7:36 AM  Result Value Ref Range   SURGICAL PATHOLOGY      SURGICAL PATHOLOGY CASE: [id number] PATIENT: Evlyn Console Surgical Pathology Report     Clinical History: Screening (kc)     FINAL MICROSCOPIC DIAGNOSIS:  A. COLON, ASCENDING, POLYPECTOMY:      Tubular adenoma.      Negative for high-grade dysplasia.  B. COLON, SIGMOID, POLYPECTOMY:      Hyperplastic polyp.      Negative for dysplasia.   GROSS DESCRIPTION:  A.  Received in formalin is a tan, soft tissue fragment that is submitted in toto.  Size: 1.0 x 0.2 x 0.1 cm, 1 block submitted.  B.  Received in formalin is a tan, soft tissue fragment that is submitted in toto.  Size: 0.3 x 0.2 x 0.1 cm, 1 block submitted. (KW, 12/16/2022)   Final Diagnosis performed by Pepper Dutton, MD.   Electronically signed 12/19/2022 Technical component performed at [facility], [address], [place], [place] [address].  Professional component performed at [employer]. [facility], [address] [place], [place] [address].  Immunohistochemi stry Technical component (if applicable) was performed at [facility]. [address], [place], [place] [address].   IMMUNOHISTOCHEMISTRY DISCLAIMER (if applicable): Some of these immunohistochemical stains may have been developed and the performance characteristics determine by [facility]. Some may not have been cleared or approved by the U.S. Food and Drug Administration. The FDA has determined that such clearance or approval is not necessary. This test is used for clinical purposes. It should not be regarded as investigational or for research.  This laboratory is certified under the Clinical Laboratory Improvement Amendments of 1988 (CLIA-88) as qualified to perform high complexity clinical laboratory testing.  The controls stained appropriately.   IHC stains are performed on formalin fixed, paraffin embedded tissue using a 3,3diaminobenzidine (DAB) chromogen and Leica Bond Autostainer System. The staining intensity of the nucl eus is score manually and is reported as the percentage of tumor cell nuclei demonstrating specific nuclear staining. The specimens are fixed in 10% Neutral Formalin for at least 6 hours and up to 72hrs. These tests are validated on decalcified tissue. Results should be interpreted with caution given the possibility of false negative results on decalcified specimens. Antibody Clones are as follows ER-clone [id number], PR-clone 16, Ki67- clone MM1. Some of these immunohistochemical stains may have been developed and the performance characteristics determined by [facility].      Psychiatric Specialty Exam: Physical Exam  Review of Systems  Musculoskeletal:  Positive for back pain.       Shoulder pain  Neurological:  Positive for numbness.    Weight 209 lb (94.8 kg).There is no height or weight on file to calculate BMI.  General Appearance: Bizarre  Eye Contact:  Fair  Speech:  Normal Rate  Volume:  Normal  Mood:  Dysphoric and tearful  Affect:  Depressed  Thought Process:  Goal Directed  Orientation:  Full (Time, Place, and Person)  Thought Content:  Rumination  Suicidal Thoughts:  No  Homicidal Thoughts:  No  Memory:  Immediate;   Good Recent;   Good Remote;   Good  Judgement:  Fair  Insight:  Present  Psychomotor Activity:  Decreased  Concentration:  Concentration: Fair and Attention Span: Fair  Recall:  [employer] of  Knowledge:  Fair  Language:  Fair  Akathisia:  No  Handed:  Right  AIMS (if indicated):     Assets:  Communication Skills Desire for  Improvement Housing Transportation  ADL's:  Intact  Cognition:  WNL  Sleep:  better     Assessment/Plan: Mood disorder (HCC) - Plan: amitriptyline  (ELAVIL ) 75 MG tablet, lamoTRIgine  (LAMICTAL ) 200 MG tablet  GAD (generalized anxiety disorder) - Plan: amitriptyline  (ELAVIL ) 75 MG tablet, lamoTRIgine  (LAMICTAL ) 200 MG tablet  Primary insomnia - Plan: amitriptyline  (ELAVIL ) 75 MG tablet  Grief - Plan: amitriptyline  (ELAVIL ) 75 MG tablet  I review blood work results, notes from other provider.  She is seeing neurology and scheduled to have EMG and nerve conduction studies.  She like amitriptyline  but there are nights when she struggle with insomnia and anxiety.  I recommend she try 75 mg amitriptyline .  Suggested not to take the trazodone  since we are increasing the amitriptyline .  Continue Lamictal  200 mg daily.  Her PCP is prescribing BuSpar  and Xanax  but she has not taken Xanax  in a while.  I encourage to continue therapy with Winton Rubinstein.  Recommended to call us  back if she is any question or any concern.  Explained possibility of sedation, drowsiness with increased amitriptyline .  Follow-up in 3 months or sooner if needed.   Follow Up Instructions:     I discussed the assessment and treatment plan with the patient. The patient was provided an opportunity to ask questions and all were answered. The patient agreed with the plan and demonstrated an understanding of the instructions.   The patient was advised to call back or seek an in-person evaluation if the symptoms worsen or if the condition fails to improve as anticipated.    Collaboration of Care: Other provider involved in patient's care AEB notes are available in epic to review  Patient/Guardian was advised Release of Information must be obtained prior to any record release in order to collaborate their care with an outside provider. Patient/Guardian was advised if they have not already done so to contact the registration  department to sign all necessary forms in order for us  to release information regarding their care.   Consent: Patient/Guardian gives verbal consent for treatment and assignment of benefits for services provided during this visit. Patient/Guardian expressed understanding and agreed to proceed.     I provided 24 minutes of non face to face time during this encounter.  Note: This document was prepared by [employer] voice dictation technology and any errors that results from this process are unintentional.    Leni ONEIDA Client, MD 02/09/2023

## 2023-02-13 ENCOUNTER — Ambulatory Visit (INDEPENDENT_AMBULATORY_CARE_PROVIDER_SITE_OTHER): Payer: 59 | Admitting: Psychiatry

## 2023-02-13 DIAGNOSIS — F39 Unspecified mood [affective] disorder: Secondary | ICD-10-CM

## 2023-02-13 NOTE — Progress Notes (Signed)
IN-PERSON  THERAPIST PROGRESS NOTE  Session Time: Monday 02/13/2023 3:17 PM - 4:00 PM  Participation Level: Active  Behavioral Response: CasualAlertless anxious, less depressed, sadness, tearful  Type of Therapy: Individual Therapy  Treatment Goals addressed:   Reduce episodes of irritability and lashing out to 1 x every 2 weeks for 30 days per pt's report      Pt will learn and implement relaxation techniques, will practice a relaxation technique daily    ProgressTowards Goals:   Interventions: Supportive, CBT   Summary: Stephanie Cannon is a 53 y.o. female who is referred for services by PCP Dr. Lodema Cannon due to pt experiencing symptoms of anxiety. Pt sees psychiatrist Dr. Lolly Cannon for medication management. Pt denies any psychiatric hospitalizations. Pt participated in teleheath counseling with son last year. Pt reports experiencing grief and loss issues related to the death  of a cousin who was like a sister in July 2024. She states she can't hardly talk about her without becoming very emotional. She also reports stress related to recently being injured on the job. She says she has been made to feel like "I am the blame" by her employer. She reports financial stress. She reports additional stress regarding conflict between son and husband resulting in them not talking to leach other since July 2024. She also worries about her mother as she suspects mother may have dementia. Pt's current symptoms include sadness, worry, tearfulness,  difficulty concentration, fatigue, irritability, sleep difficulty, and restlessness.    Patient last was seen about 2 weeks ago.  She reports recently learning she may need shoulder surgery. She will continue PT, has a follow up appt with her doctor in 6 weeks, and will remain on medical leave at least until that time. She reports increased thoughts about deceased cousin and becomes very tearful in session as she talks about cousin. She reports trying not to grieve  or cry as husband has told her she should be over it by now. She and cousin were best friends and talked with each other daily. Pt states missing cousin being there to listen and to give her guidance.   Suicidal/Homicidal: Nowithout intent/plan  Therapist Response: Reviewed symptoms,  discussed stressors, facilitated expression of thoughts and feelings, validated feelings, facilitated patient sharing narrative of her cousin's death, facilitated patient expressing thoughts and feelings, validated and normalized feelings related to grief and loss, discussed approaching rather than avoiding her feelings, began to assist patient identify possible effects of avoiding her feelings, assisted patient to begin to identify ways she was dependent upon her cousin,  Plan: Return again in 2 weeks.  Diagnosis: Mood disorder  Collaboration of Care: Psychiatrist AEB patient sees psychiatrist Dr. Lolly Cannon for medication management  Patient/Guardian was advised Release of Information must be obtained prior to any record release in order to collaborate their care with an outside provider. Patient/Guardian was advised if they have not already done so to contact the registration department to sign all necessary forms in order for [place] to release information regarding their care.   Consent: Patient/Guardian gives verbal consent for treatment and assignment of benefits for services provided during this visit. Patient/Guardian expressed understanding and agreed to proceed.   Stephanie Salvage, LCSW 02/13/2023

## 2023-02-15 ENCOUNTER — Ambulatory Visit (INDEPENDENT_AMBULATORY_CARE_PROVIDER_SITE_OTHER): Payer: Self-pay | Admitting: Neurology

## 2023-02-15 ENCOUNTER — Encounter: Payer: Self-pay | Admitting: Neurology

## 2023-02-15 ENCOUNTER — Ambulatory Visit (INDEPENDENT_AMBULATORY_CARE_PROVIDER_SITE_OTHER): Payer: 59 | Admitting: Neurology

## 2023-02-15 DIAGNOSIS — R202 Paresthesia of skin: Secondary | ICD-10-CM | POA: Diagnosis not present

## 2023-02-15 DIAGNOSIS — Z0289 Encounter for other administrative examinations: Secondary | ICD-10-CM

## 2023-02-15 DIAGNOSIS — G5712 Meralgia paresthetica, left lower limb: Secondary | ICD-10-CM | POA: Diagnosis not present

## 2023-02-15 DIAGNOSIS — R32 Unspecified urinary incontinence: Secondary | ICD-10-CM | POA: Diagnosis not present

## 2023-02-15 NOTE — Procedures (Signed)
Full Name: Stephanie Cannon Gender: Female MRN #: [id number] Date of Birth: 1970-09-15    Visit Date: 02/15/2023 08:36 Age: 53 Years Examining Physician: Dr. Levert Feinstein Referring Physician: Dr. Levert Feinstein Height: 5 feet 1 inch History: 53 year old female presenting with low back pain, intermittent left leg weakness, history of lumbar decompression surgery for left lumbar radiculopathy in the past,  Summary of the test: Nerve conduction study: Bilateral sural, superficial peroneal sensory responses were normal.  Right ulnar sensory responses were normal.  Right median sensory response showed moderately prolonged peak latency with well-preserved snap amplitude.  Bilateral tibial, peroneal to EDB, right ulnar motor responses were normal. Right median motor response showed mildly prolonged distal latency, normal CMAP amplitude  Electromyography: Selected needle examination of bilateral lower extremity muscles and lumbosacral paraspinal muscles were normal  Conclusion: This is a slight abnormal study.  There is electrodiagnostic evidence of moderate right median neuropathy across the wrist, consistent with moderate right carpal tunnel syndromes.  There is no evidence of active bilateral lumbosacral radiculopathy or large fiber peripheral neuropathy.   Levert Feinstein. M.D. Ph.D.   [facility] [address], [address] [place], [place] [address] Tel: [phone] Fax: [phone]  Verbal informed consent was obtained from the patient, patient was informed of potential risk of procedure, including bruising, bleeding, hematoma formation, infection, muscle weakness, muscle pain, numbness, among others.        MNC    Nerve / Sites Muscle Latency Ref. Amplitude Ref. Rel Amp Segments Distance Velocity Ref. Area    ms ms mV mV %  cm m/s m/s mVms  R Median - APB     Wrist APB 4.5 <=4.4 8.3 >=4.0 100 Wrist - APB 7   43.3     Upper arm APB 9.1  7.3  88.9 Upper arm - Wrist 23.6  51 >=49 39.3  R Ulnar - ADM     Wrist ADM 2.9 <=3.3 12.9 >=6.0 100 Wrist - ADM 7   55.6     B.Elbow ADM 4.0  9.7  75.6 B.Elbow - Wrist 7.6 72 >=49 42.1     A.Elbow ADM 7.5  12.2  125 A.Elbow - B.Elbow 19 54 >=49 55.3  L Peroneal - EDB     Ankle EDB 4.3 <=6.5 4.6 >=2.0 100 Ankle - EDB 9   19.3     Fib head EDB 9.6  6.4  137 Fib head - Ankle 25 47 >=44 27.5     Pop fossa EDB 11.4  6.3  99.3 Pop fossa - Fib head 10 56 >=44 27.8         Pop fossa - Ankle      R Peroneal - EDB     Ankle EDB 4.0 <=6.5 3.1 >=2.0 100 Ankle - EDB 9   11.5     Fib head EDB 9.1  3.5  115 Fib head - Ankle 24.6 48 >=44 13.6     Pop fossa EDB 10.7  3.5  98.4 Pop fossa - Fib head 11 67 >=44 13.3         Pop fossa - Ankle      L Tibial - AH     Ankle AH 4.2 <=5.8 9.6 >=4.0 100 Ankle - AH 9   35.1     Pop fossa AH 10.9  9.9  103 Pop fossa - Ankle 34 51 >=41 42.7  R Tibial - AH     Ankle AH 4.7 <=5.8 6.7 >=  4.0 100 Ankle - AH 9   21.1     Pop fossa AH 10.4  7.7  115 Pop fossa - Ankle 33 57 >=41 27.2                 SNC    Nerve / Sites Rec. Site Peak Lat Ref.  Amp Ref. Segments Distance    ms ms V V  cm  L Sural - Ankle (Calf)     Calf Ankle 2.3 <=4.4 11 >=6 Calf - Ankle 7  R Sural - Ankle (Calf)     Calf Ankle 2.9 <=4.4 8 >=6 Calf - Ankle 10  L Superficial peroneal - Ankle     Lat leg Ankle 3.2 <=4.4 6 >=6 Lat leg - Ankle 14  R Superficial peroneal - Ankle     Lat leg Ankle 3.6 <=4.4 11 >=6 Lat leg - Ankle 14  R Median - Orthodromic (Dig II, Mid palm)     Dig II Wrist 4.6 <=3.4 12 >=10 Dig II - Wrist 13  R Ulnar - Orthodromic, (Dig V, Mid palm)     Dig V Wrist 3.0 <=3.1 9 >=5 Dig V - Wrist 76                 F  Wave    Nerve F Lat Ref.   ms ms  L Tibial - AH 44.8 <=56.0  R Tibial - AH 43.5 <=56.0  L Ulnar - ADM 25.6 <=32.0           EMG Summary Table    Spontaneous MUAP Recruitment  Muscle IA Fib PSW Fasc Other Amp Dur. Poly Pattern  L. Tibialis anterior Normal None None None _______ Normal Normal  Normal Normal  L. Tibialis posterior Normal None None None _______ Normal Normal Normal Normal  L. Peroneus longus Normal None None None _______ Normal Normal Normal Normal  L. Gastrocnemius (Medial head) Normal None None None _______ Normal Normal Normal Normal  L. Vastus lateralis Normal None None None _______ Normal Normal Normal Normal  R. Tibialis anterior Normal None None None _______ Normal Normal Normal Normal  R. Tibialis posterior Normal None None None _______ Normal Normal Normal Normal  R. Gastrocnemius (Medial head) Normal None None None _______ Normal Normal Normal Normal  R. Vastus lateralis Normal None None None _______ Normal Normal Normal Normal  R. Peroneus longus Normal None None None _______ Normal Normal Normal Normal  R. Lumbar paraspinals (low) Normal None None None _______ Normal Normal Normal Normal  R. Lumbar paraspinals (mid) Normal None None None _______ Normal Normal Normal Normal  L. Lumbar paraspinals (low) Normal None None None _______ Normal Normal Normal Normal  L. Lumbar paraspinals (mid) Normal None None None _______ Normal Normal Normal Normal

## 2023-02-15 NOTE — Progress Notes (Signed)
ASSESSMENT AND PLAN  Stephanie Cannon is a 53 y.o. female   Worsening low back pain and urinary urgency, lower extremity discomfort,  Likely due to combination of deconditioning, obesity, also had a history of of lumbar decompression surgery, can be residual symptoms from previous lumbar stenosis  MRI of the lumbar spine  EMG nerve conduction study on February 15, 2023 showed no evidence of active bilateral lumbosacral radiculopathy,   DIAGNOSTIC DATA (LABS, IMAGING, TESTING) - I reviewed patient records, labs, notes, testing and imaging myself where available.  MEDICAL HISTORY:  Stephanie Cannon is a 53 year old female, seen in request by her primary care physician Dr. Lodema Hong, [facility] Mallick, for evaluation of left thigh numbness, initial evaluation in October 12, 2021  I reviewed and summarized the referring note. PMHX Depression, Anxiety Chronic insomnia GERD HTN Right CTS release, did help Obesity Gastric bypass in 2016, lost 100 LB Lumbar decompression surgery in 2016 for severe low back pain, left radiculopathy, Dr. Lovell Sheehan  She has a history of lumbar decompression surgery in 2016 for severe low back pain, left lower extremity radiating pain, surgery did help her symptoms, she worked best for job, sitting for prolonged hours but still had intermittent low back pain, especially with prolonged sitting  Since February 2023, she noticed left lateral thigh area numbness, burning, tingly sensation, been persistent since its onset  She has a long history of urinary urgency, no worsening, no gait abnormality, no bower incontinence  UPDATE May 7th 2024: She complains of lateral thigh area numbness, crawling sensation, no significant pain, despite previous lumbar decompression surgery, she complains of constant midline low back deep achy pain, denies radiating pain, she works at a desk job,  Since starting Cymbalta, her neck pain, body achy pain has improved, tolerating it well,  needing refill  MRI of the cervical without contrast report from [facility] November 16, 2021, abnormal tissue in the right side of the nasopharynx, recommend ENT evaluation and direct visualization to exclude tumor, Arnold-Chiari malformation, cerebellar tonsillar extent 8 mm below foramen magnum, slightly pointed, appears to have some crowding of the foramen magnum,  She was seen by ENT, no significant abnormality found  UPDATE Dec 14 2022: She works at a [employer] as a second language teacher, 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 her chair at work, landed on her left side, injured her left shoulder, going through orthopedic evaluation,  Since then, she complains of worsening low back pain, continue have left lateral thigh paresthesia, urinary urgency, incontinence, wearing thick pads,  She tolerated Cymbalta 60 mg daily, seems to help her symptom some  UPDATE Feb 15 2023: She continue complains of low back pain, intermittent bilateral lower extremity discomfort and pain, left worse than right,  EMG nerve conduction study today showed no evidence of active lumbosacral radiculopathy, moderate right carpal tunnel syndromes  Because of her low back pain lower extremity symptoms, she was not able to continue her work, waiting on [employer]   PHYSICAL EXAM:      02/09/2023    3:29 PM 12/16/2022    7:46 AM 12/16/2022    7:06 AM  Vitals with BMI  Weight   209 lbs  BMI   39.51  Systolic  111 129  Diastolic  71 78  Pulse  82 81    PHYSICAL EXAMNIATION:  Gen: NAD, conversant, well nourised, well groomed                     Cardiovascular: Regular rate rhythm, no peripheral  edema, warm, nontender. Eyes: Conjunctivae clear without exudates or hemorrhage Neck: Supple, no carotid bruits. Pulmonary: Clear to auscultation bilaterally   NEUROLOGICAL EXAM:  MENTAL STATUS: Speech/cognition: Awake, alert, oriented to history taking and casual conversation CRANIAL NERVES: CN II: Visual fields are full to  confrontation. Pupils are round equal and briskly reactive to light. CN III, IV, VI: extraocular movement are normal. No ptosis. CN V: Facial sensation is intact to light touch CN VII: Face is symmetric with normal eye closure  CN VIII: Hearing is normal to causal conversation. CN IX, X: Phonation is normal. CN XI: Head turning and shoulder shrug are intact  MOTOR: No significant muscle weakness noted, variable effort on examination  REFLEXES: Reflexes are 2+ and symmetric at the biceps, triceps, knees, and ankles. Plantar responses are flexor.  SENSORY: Decreased sensation at the left lateral thigh  COORDINATION: There is no trunk or limb dysmetria noted.  GAIT/STANCE: Need to push hard to get up from sitting position, cautious,  REVIEW OF SYSTEMS:  Full 14 system review of systems performed and notable only for as above All other review of systems were negative.   ALLERGIES: Allergies  Allergen Reactions   Codeine Itching   Morphine Itching    HOME MEDICATIONS: Current Outpatient Medications  Medication Sig Dispense Refill   albuterol (PROVENTIL) (2.5 MG/3ML) 0.083% nebulizer solution Take 3 mLs (2.5 mg total) by nebulization every 6 (six) hours as needed for wheezing or shortness of breath. 360 mL 3   albuterol (VENTOLIN HFA) 108 (90 Base) MCG/ACT inhaler Inhale 2 puffs into the lungs every 6 (six) hours as needed for wheezing or shortness of breath. 8 g 0   ALPRAZolam (XANAX) 1 MG tablet Take 1 tablet (1 mg total) by mouth at bedtime. 90 tablet 1   amitriptyline (ELAVIL) 75 MG tablet Take 1 tablet (75 mg total) by mouth at bedtime. 90 tablet 0   azelastine (ASTELIN) 0.1 % nasal spray Place 2 sprays into both nostrils 2 (two) times daily. Use in each nostril as directed (Patient taking differently: Place 2 sprays into both nostrils 2 (two) times daily as needed. Use in each nostril as directed) 30 mL 4   busPIRone (BUSPAR) 5 MG tablet Take 1 tablet (5 mg total) by mouth 3  (three) times daily. 90 tablet 1   celecoxib (CELEBREX) 100 MG capsule Take 1 capsule (100 mg total) by mouth 2 (two) times daily. 60 capsule 2   cyclobenzaprine (FLEXERIL) 10 MG tablet Take 1 tablet (10 mg total) by mouth at bedtime. 90 tablet 0   DULoxetine (CYMBALTA) 60 MG capsule Take 1 capsule (60 mg total) by mouth daily. 90 capsule 3   esomeprazole (NEXIUM) 40 MG capsule TAKE ONE CAPSULE BY MOUTH ONCE DAILY. 90 capsule 0   ibuprofen (ADVIL) 800 MG tablet TAKE ONE TABLET BY MOUTH EVERY (8) HOURS AS NEEDED. 30 tablet 1   lamoTRIgine (LAMICTAL) 200 MG tablet Take 1 tab daily 90 tablet 0   Multiple Vitamins-Minerals (MULTIVITAMIN GUMMIES ADULT PO) Take 1 each by mouth 2 (two) times daily.     potassium chloride (KLOR-CON M) 10 MEQ tablet Take 1 tablet (10 mEq total) by mouth 2 (two) times daily. 60 tablet 5   triamterene-hydrochlorothiazide (MAXZIDE) 75-50 MG tablet TAKE ONE TABLET BY MOUTH ONCE DAILY. 90 tablet 3   No current facility-administered medications for this visit.    PAST MEDICAL HISTORY: Past Medical History:  Diagnosis Date   Arthritis    Asthma  Phreesia 07/31/2019   Breast discharge    left spont white discharge x 6 months   Carpal tunnel syndrome    left, wears brace at night   Chronic back pain    GERD (gastroesophageal reflux disease)    Hyperlipidemia    Hypertension    Obesity     PAST SURGICAL HISTORY: Past Surgical History:  Procedure Laterality Date   ABDOMINAL HYSTERECTOMY  2003   partial   BREAST BIOPSY Left 2018   benign   CARPAL TUNNEL RELEASE Right    CESAREAN SECTION  1988   CESAREAN SECTION N/A    Phreesia 07/31/2019   CHOLECYSTECTOMY     COLONOSCOPY N/A 09/21/2012   Procedure: COLONOSCOPY;  Surgeon: West Bali, MD;  [facility]: [facility];  Service: Endoscopy;  Laterality: N/A;  9:30   COLONOSCOPY WITH PROPOFOL N/A 12/16/2022   Procedure: COLONOSCOPY WITH PROPOFOL;  Surgeon: Lanelle Bal, DO;  [facility]: [facility];   Service: Endoscopy;  Laterality: N/A;  730am, asa 2   LAPAROSCOPIC GASTRIC SLEEVE RESECTION WITH HIATAL HERNIA REPAIR N/A 06/23/2014   Procedure: LAPAROSCOPIC LYSIS OF ADHESIONS, GASTRIC SLEEVE RESECTION AND UPPER ENDO;  Surgeon: Ovidio Kin, MD;  Location: [facility];  Service: General;  Laterality: N/A;   LAPAROSCOPIC LYSIS OF ADHESIONS  06/23/2014   Procedure: LAPAROSCOPIC LYSIS OF ADHESIONS;  Surgeon: Ovidio Kin, MD;  Location: [facility];  Service: General;;   LUMBAR FUSION  01/22/2014   L4 L5       DR Lovell Sheehan   other     Vocal chord polyp removal   ovarian tumor  2005   ovaries removed    POLYPECTOMY  12/16/2022   Procedure: POLYPECTOMY;  Surgeon: Lanelle Bal, DO;  [facility];  Service: Endoscopy;;   SPINE SURGERY N/A    Phreesia 07/31/2019   TONSILLECTOMY     UPPER GI ENDOSCOPY  06/23/2014   Procedure: UPPER GI ENDOSCOPY;  Surgeon: Ovidio Kin, MD;  Location: [facility];  Service: General;;    FAMILY HISTORY: Family History  Problem Relation Age of Onset   Hypertension Mother    Hypertension Father    Hypertension Brother    Cancer Maternal Aunt    Cancer Maternal Uncle        liver   Cancer Paternal Uncle        bone cancer   Diabetes Maternal Grandmother    Colon cancer Neg Hx     SOCIAL HISTORY: Social History   Socioeconomic History   Marital status: Married    Spouse name: Not on file   Number of children: Not on file   Years of education: Not on file   Highest education level: Associate degree: occupational, Scientist, product/process development, or vocational program  Occupational History   Occupation: Company secretary: [employer]    Comment: [employer]   Tobacco Use   Smoking status: Former    Current packs/day: 0.00    Average packs/day: 0.8 packs/day for 17.0 years (12.8 ttl pk-yrs)    Types: Cigarettes    Start date: 01/04/1992    Quit date: 01/03/2009    Years since quitting: 14.1   Smokeless tobacco: Never  Vaping Use   Vaping status: Never Used   Substance and Sexual Activity   Alcohol use: Yes    Comment: occasionally   Drug use: No   Sexual activity: Yes  Other Topics Concern   Not on file  Social History Narrative   Not on  file   Social Drivers of Health   Financial Resource Strain: Not on file  Food Insecurity: No Food Insecurity (04/20/2022)   Hunger Vital Sign    Worried About Running Out of Food in the Last Year: Never true    Ran Out of Food in the Last Year: Never true  Transportation Needs: No Transportation Needs (04/20/2022)   PRAPARE - Administrator, Civil Service (Medical): No    Lack of Transportation (Non-Medical): No  Physical Activity: Unknown (04/20/2022)   Exercise Vital Sign    Days of Exercise per Week: 0 days    Minutes of Exercise per Session: Not on file  Stress: Stress Concern Present (04/20/2022)   [employer] of Occupational Health - Occupational Stress Questionnaire    Feeling of Stress : Rather much  Social Connections: Socially Integrated (04/20/2022)   Social Connection and Isolation Panel [NHANES]    Frequency of Communication with Friends and Family: More than three times a week    Frequency of Social Gatherings with Friends and Family: Twice a week    Attends Religious Services: More than 4 times per year    Active Member of [employer] or Organizations: Yes    Attends Banker Meetings: 1 to 4 times per year    Marital Status: Married  Catering manager Violence: Not on file      Levert Feinstein, M.D. Ph.D.  [facility] [address], [address] [place], [place] [address] Ph: [phone] Fax: [phone]  CC:  Kerri Perches, MD [address], [address] [place],  [place] [address]  Kerri Perches, MD

## 2023-02-16 ENCOUNTER — Telehealth: Payer: Self-pay | Admitting: Neurology

## 2023-02-16 NOTE — Progress Notes (Signed)
EMG nerve conduction study report is under procedure tab

## 2023-02-16 NOTE — Telephone Encounter (Signed)
MRI order sent to [facility] they schedule it. [phone]

## 2023-02-19 ENCOUNTER — Encounter (HOSPITAL_COMMUNITY): Payer: Self-pay

## 2023-02-21 ENCOUNTER — Encounter: Payer: Self-pay | Admitting: Family Medicine

## 2023-02-22 ENCOUNTER — Other Ambulatory Visit: Payer: Self-pay

## 2023-02-22 DIAGNOSIS — M62830 Muscle spasm of back: Secondary | ICD-10-CM

## 2023-02-22 MED ORDER — POTASSIUM CHLORIDE CRYS ER 10 MEQ PO TBCR: 10.0000 meq | EXTENDED_RELEASE_TABLET | Freq: Two times a day (BID) | ORAL | 2 refills | Status: DC

## 2023-02-22 MED ORDER — CYCLOBENZAPRINE HCL 10 MG PO TABS: 10.0000 mg | ORAL_TABLET | Freq: Every day | ORAL | 1 refills | Status: DC

## 2023-02-22 MED ORDER — ESOMEPRAZOLE MAGNESIUM 40 MG PO CPDR: 40.0000 mg | DELAYED_RELEASE_CAPSULE | Freq: Every day | ORAL | 1 refills | Status: DC

## 2023-02-22 MED ORDER — TRIAMTERENE-HCTZ 75-50 MG PO TABS: 1.0000 | ORAL_TABLET | Freq: Every day | ORAL | 3 refills | Status: DC

## 2023-02-22 MED ORDER — BUSPIRONE HCL 5 MG PO TABS: 5.0000 mg | ORAL_TABLET | Freq: Three times a day (TID) | ORAL | 1 refills | Status: DC

## 2023-02-22 NOTE — Telephone Encounter (Signed)
90-day supplies sent.  

## 2023-02-27 ENCOUNTER — Ambulatory Visit (HOSPITAL_COMMUNITY): Payer: 59 | Admitting: Psychiatry

## 2023-02-27 ENCOUNTER — Telehealth (HOSPITAL_COMMUNITY): Payer: Self-pay | Admitting: Psychiatry

## 2023-02-27 NOTE — Telephone Encounter (Signed)
 Therapist called patient regarding scheduled in office appointment.  Patient reported schedule conflict as she was in another appointment.

## 2023-03-03 ENCOUNTER — Encounter: Payer: Self-pay | Admitting: Family Medicine

## 2023-03-13 ENCOUNTER — Ambulatory Visit (HOSPITAL_COMMUNITY): Payer: BC Managed Care – PPO | Admitting: Psychiatry

## 2023-03-16 ENCOUNTER — Other Ambulatory Visit: Payer: Self-pay

## 2023-03-16 ENCOUNTER — Encounter: Payer: Self-pay | Admitting: Family Medicine

## 2023-03-16 MED ORDER — TRIAMTERENE-HCTZ 75-50 MG PO TABS: 1.0000 | ORAL_TABLET | Freq: Every day | ORAL | 3 refills | Status: DC

## 2023-03-20 DIAGNOSIS — I10 Essential (primary) hypertension: Secondary | ICD-10-CM | POA: Diagnosis not present

## 2023-03-20 DIAGNOSIS — E785 Hyperlipidemia, unspecified: Secondary | ICD-10-CM | POA: Diagnosis not present

## 2023-03-21 ENCOUNTER — Encounter: Payer: Self-pay | Admitting: Family Medicine

## 2023-03-21 ENCOUNTER — Ambulatory Visit (INDEPENDENT_AMBULATORY_CARE_PROVIDER_SITE_OTHER): Payer: BC Managed Care – PPO | Admitting: Family Medicine

## 2023-03-21 VITALS — BP 127/81 | HR 85 | Ht 61.0 in | Wt 217.0 lb

## 2023-03-21 DIAGNOSIS — F322 Major depressive disorder, single episode, severe without psychotic features: Secondary | ICD-10-CM | POA: Diagnosis not present

## 2023-03-21 DIAGNOSIS — E785 Hyperlipidemia, unspecified: Secondary | ICD-10-CM | POA: Diagnosis not present

## 2023-03-21 DIAGNOSIS — F411 Generalized anxiety disorder: Secondary | ICD-10-CM | POA: Diagnosis not present

## 2023-03-21 DIAGNOSIS — F419 Anxiety disorder, unspecified: Secondary | ICD-10-CM

## 2023-03-21 DIAGNOSIS — I10 Essential (primary) hypertension: Secondary | ICD-10-CM | POA: Diagnosis not present

## 2023-03-21 LAB — LIPID PANEL
Chol/HDL Ratio: 2.9 ratio (ref 0.0–4.4)
Cholesterol, Total: 247 mg/dL — ABNORMAL HIGH (ref 100–199)
HDL: 85 mg/dL (ref >39)
LDL Chol Calc (NIH): 153 mg/dL — ABNORMAL HIGH (ref 0–99)
Triglycerides: 58 mg/dL (ref 0–149)
VLDL Cholesterol Cal: 9 mg/dL (ref 5–40)

## 2023-03-21 LAB — CMP14+EGFR
ALT: 19 IU/L (ref 0–32)
AST: 29 IU/L (ref 0–40)
Albumin: 4.4 g/dL (ref 3.8–4.9)
Alkaline Phosphatase: 117 IU/L (ref 44–121)
BUN/Creatinine Ratio: 17 (ref 9–23)
BUN: 14 mg/dL (ref 6–24)
Bilirubin Total: 0.4 mg/dL (ref 0.0–1.2)
CO2: 25 mmol/L (ref 20–29)
Calcium: 9.7 mg/dL (ref 8.7–10.2)
Chloride: 97 mmol/L (ref 96–106)
Creatinine, Ser: 0.83 mg/dL (ref 0.57–1.00)
Globulin, Total: 3.2 g/dL (ref 1.5–4.5)
Glucose: 82 mg/dL (ref 70–99)
Potassium: 3.5 mmol/L (ref 3.5–5.2)
Sodium: 139 mmol/L (ref 134–144)
Total Protein: 7.6 g/dL (ref 6.0–8.5)
eGFR: 84 mL/min/{1.73_m2} (ref >59)

## 2023-03-21 LAB — CBC
Hematocrit: 44.5 % (ref 34.0–46.6)
Hemoglobin: 15.2 g/dL (ref 11.1–15.9)
MCH: 31.3 pg (ref 26.6–33.0)
MCHC: 34.2 g/dL (ref 31.5–35.7)
MCV: 92 fL (ref 79–97)
Platelets: 251 10*3/uL (ref 150–450)
RBC: 4.85 x10E6/uL (ref 3.77–5.28)
RDW: 12.4 % (ref 11.7–15.4)
WBC: 7.1 10*3/uL (ref 3.4–10.8)

## 2023-03-21 MED ORDER — WEGOVY 0.25 MG/0.5ML ~~LOC~~ SOAJ: 0.2500 mg | SUBCUTANEOUS | Status: DC

## 2023-03-21 MED ORDER — ALPRAZOLAM 1 MG PO TABS: 1.0000 mg | ORAL_TABLET | Freq: Every day | ORAL | 5 refills | Status: AC

## 2023-03-21 MED ORDER — EZETIMIBE 10 MG PO TABS
10.0000 mg | ORAL_TABLET | Freq: Every day | ORAL | 1 refills | Status: DC
Start: 2023-03-21 — End: 2023-07-04

## 2023-03-21 MED ORDER — WEGOVY 0.5 MG/0.5ML ~~LOC~~ SOAJ: 0.5000 mg | SUBCUTANEOUS | 1 refills | Status: DC

## 2023-03-21 NOTE — Assessment & Plan Note (Signed)
  Patient re-educated about  the importance of commitment to a  minimum of 150 minutes of exercise per week as able.  The importance of healthy food choices with portion control discussed, as well as eating regularly and within a 12 hour window most days. The need to choose "clean , green" food 50 to 75% of the time is discussed, as well as to make water the primary drink and set a goal of 64 ounces water daily.       03/21/2023    4:05 PM 02/09/2023    3:29 PM 12/16/2022    7:06 AM  Weight /BMI  Weight 217 lb  209 lb  Height 5\' 1"  (1.549 m)    BMI 41 kg/m2  39.49 kg/m2     Information is confidential and restricted. Go to Review Flowsheets to unlock data.    Deteriorated . Wegovy x 1 month provided, will let me know if approved

## 2023-03-21 NOTE — Assessment & Plan Note (Signed)
 Improved with therapy continue same and current meds

## 2023-03-21 NOTE — Assessment & Plan Note (Signed)
 Managed by Psych and improved , also now getting therapy

## 2023-03-21 NOTE — Progress Notes (Signed)
 Stephanie Cannon     MRN: [id number]      DOB: Sep 20, 1970  Chief Complaint  Patient presents with   Hypertension    4 month Follow up     HPI Stephanie Cannon is here for follow up and re-evaluation of chronic medical conditions, medication management and review of any available recent lab and radiology data.  Preventive health is updated, specifically  Cancer screening and Immunization.   Questions or concerns regarding consultations or procedures which the PT has had in the interim are  addressed. The PT denies any adverse reactions to current medications since the last visit.  Overall doing better In physical therapy for injury on job, improving and expects to return to work next 2 to 4 weeks Having mental health counseling snd reports improvement in particular dealing with grief Concerned about weight wants to start medication , thinks wegovy is covered by her ins will provide first month  no h/o pancreatitis or thyroid cancer  ROS Denies recent fever or chills. Denies sinus pressure, nasal congestion, ear pain or sore throat. Denies chest congestion, productive cough or wheezing. Denies chest pains, palpitations and leg swelling Denies abdominal pain, nausea, vomiting,diarrhea or constipation.   Denies dysuria, frequency, hesitancy or incontinence. Improved  joint pain, swelling and limitation in mobility. Denies headaches, seizures, numbness, or tingling. Denies uncontrolled depression, anxiety or insomnia. Denies skin break down or rash.   PE  BP 127/81   Pulse 85   Ht 5\' 1"  (1.549 m)   Wt 217 lb (98.4 kg)   SpO2 98%   BMI 41.00 kg/m   Patient alert and oriented and in no cardiopulmonary distress.  HEENT: No facial asymmetry, EOMI,     Neck supple .  Chest: Clear to auscultation bilaterally.  CVS: S1, S2 no murmurs, no S3.Regular rate.  ABD: Soft non tender.   Ext: No edema  MS: Adequate ROM spine, shoulders, hips and knees.  Skin: Intact, no ulcerations or  rash noted.  Psych: Good eye contact, normal affect. Memory intact not anxious or depressed appearing.  CNS: CN 2-12 intact, power,  normal throughout.no focal deficits noted.   Assessment & Plan  Essential hypertension Controlled, no change in medication DASH diet and commitment to daily physical activity for a minimum of 30 minutes discussed and encouraged, as a part of hypertension management. The importance of attaining a healthy weight is also discussed.     03/21/2023    4:05 PM 02/09/2023    3:29 PM 12/16/2022    7:46 AM 12/16/2022    7:06 AM 12/14/2022   11:12 AM 12/07/2022   11:15 AM 11/16/2022    3:12 PM  BP/Weight  Systolic BP 127  440 129 143  102  Diastolic BP 81  71 78 72  93  Wt. (Lbs) 217   209 216  214  BMI 41 kg/m2   39.49 kg/m2 40.81 kg/m2  40.43 kg/m2     Information is confidential and restricted. Go to Review Flowsheets to unlock data.       GAD (generalized anxiety disorder) Improved with therapy continue same and current meds  Depression, major, single episode, severe (HCC) Managed by Psych and improved , also now getting therapy  Morbid obesity (HCC)  Patient re-educated about  the importance of commitment to a  minimum of 150 minutes of exercise per week as able.  The importance of healthy food choices with portion control discussed, as well as eating regularly and within a 12  hour window most days. The need to choose "clean , green" food 50 to 75% of the time is discussed, as well as to make water the primary drink and set a goal of 64 ounces water daily.       03/21/2023    4:05 PM 02/09/2023    3:29 PM 12/16/2022    7:06 AM  Weight /BMI  Weight 217 lb  209 lb  Height 5\' 1"  (1.549 m)    BMI 41 kg/m2  39.49 kg/m2     Information is confidential and restricted. Go to Review Flowsheets to unlock data.    Deteriorated . Wegovy x 1 month provided, will let me know if approved  Hyperlipidemia LDL goal <100 Hyperlipidemia:Low fat diet  discussed and encouraged. Start zetia   Lipid Panel  Lab Results  Component Value Date   CHOL 247 (H) 03/20/2023   HDL 85 03/20/2023   LDLCALC 153 (H) 03/20/2023   TRIG 58 03/20/2023   CHOLHDL 2.9 03/20/2023     Needs to reduce fried and fatty foods , also start zetia

## 2023-03-21 NOTE — Assessment & Plan Note (Addendum)
 Hyperlipidemia:Low fat diet discussed and encouraged. Start zetia   Lipid Panel  Lab Results  Component Value Date   CHOL 247 (H) 03/20/2023   HDL 85 03/20/2023   LDLCALC 153 (H) 03/20/2023   TRIG 58 03/20/2023   CHOLHDL 2.9 03/20/2023     Needs to reduce fried and fatty foods , also start zetia

## 2023-03-21 NOTE — Patient Instructions (Addendum)
 F/u in 3 months, call if you need me sooner  New for weight loss is  wegovy, first month sample is given to you  New for cholesterol is ezetemide, change food choice  Thanks for choosing [facility], we consider it a privelige to serve you.

## 2023-03-21 NOTE — Assessment & Plan Note (Addendum)
 Controlled, no change in medication DASH diet and commitment to daily physical activity for a minimum of 30 minutes discussed and encouraged, as a part of hypertension management. The importance of attaining a healthy weight is also discussed.     03/21/2023    4:05 PM 02/09/2023    3:29 PM 12/16/2022    7:46 AM 12/16/2022    7:06 AM 12/14/2022   11:12 AM 12/07/2022   11:15 AM 11/16/2022    3:12 PM  BP/Weight  Systolic BP 127  161 129 143  096  Diastolic BP 81  71 78 72  93  Wt. (Lbs) 217   209 216  214  BMI 41 kg/m2   39.49 kg/m2 40.81 kg/m2  40.43 kg/m2     Information is confidential and restricted. Go to Review Flowsheets to unlock data.

## 2023-03-22 ENCOUNTER — Telehealth: Payer: Self-pay | Admitting: Pharmacy Technician

## 2023-03-22 ENCOUNTER — Other Ambulatory Visit (HOSPITAL_COMMUNITY): Payer: Self-pay

## 2023-03-22 NOTE — Telephone Encounter (Signed)
 Pharmacy Patient Advocate Encounter   Received notification from Patient Pharmacy that prior authorization for [facility] 0.25MG /0.5ML AUTO-INJECTORS is required/requested.   Insurance verification completed.   The patient is insured through  [employer]  .   Per test claim: This account is for worker's compensation claims only. Will notify pharmacy to reprocess under the correct Insurance policy.

## 2023-03-27 ENCOUNTER — Telehealth: Payer: Self-pay | Admitting: Pharmacy Technician

## 2023-03-27 ENCOUNTER — Ambulatory Visit (INDEPENDENT_AMBULATORY_CARE_PROVIDER_SITE_OTHER): Payer: 59 | Admitting: Psychiatry

## 2023-03-27 ENCOUNTER — Other Ambulatory Visit (HOSPITAL_COMMUNITY): Payer: Self-pay

## 2023-03-27 DIAGNOSIS — F39 Unspecified mood [affective] disorder: Secondary | ICD-10-CM | POA: Diagnosis not present

## 2023-03-27 DIAGNOSIS — F411 Generalized anxiety disorder: Secondary | ICD-10-CM | POA: Diagnosis not present

## 2023-03-27 NOTE — Telephone Encounter (Signed)
 Pharmacy Patient Advocate Encounter   Received notification from Patient Pharmacy that prior authorization for [facility] 0.25mg /0.52ml auto-injectors is required/requested.   Insurance verification completed.   The patient is insured through [employer] .   Per test claim: Drug is excluded from coverage.

## 2023-03-27 NOTE — Progress Notes (Signed)
 IN-PERSON  THERAPIST PROGRESS NOTE  Session Time: Monday 03/27/2023 9:15 AM -  10:00 AM  Participation Level: Active  Behavioral Response: CasualAlertless anxious, less depressed,   Type of Therapy: Individual Therapy  Treatment Goals addressed:   Reduce episodes of irritability and lashing out to 1 x every 2 weeks for 30 days per pt's report      Pt will learn and implement relaxation techniques, will practice a relaxation technique daily    ProgressTowards Goals: progressing   Interventions: Supportive, CBT   Summary: Stephanie Cannon is a 53 y.o. female who is referred for services by PCP Dr. Lodema Hong due to pt experiencing symptoms of anxiety. Pt sees psychiatrist Dr. Lolly Mustache for medication management. Pt denies any psychiatric hospitalizations. Pt participated in teleheath counseling with son last year. Pt reports experiencing grief and loss issues related to the death  of a cousin who was like a sister in July 2024. She states she can't hardly talk about her without becoming very emotional. She also reports stress related to recently being injured on the job. She says she has been made to feel like "I am the blame" by her employer. She reports financial stress. She reports additional stress regarding conflict between son and husband resulting in them not talking to leach other since July 2024. She also worries about her mother as she suspects mother may have dementia. Pt's current symptoms include sadness, worry, tearfulness,  difficulty concentration, fatigue, irritability, sleep difficulty, and restlessness.      Patient last was seen about 6-7 weeks ago.  She reports multiple stressors since last session including multiple family members experiencing health issues as well as pt continuing to experience her own physical health issues. Per her report, both her parents experienced respiratory issues but have recovered. She also reports her son and husband had health issues but both also  have recovered. Pt reports trying to be more mindful of her actions and trying to take a pause before reacting. Per her report, episodes of irritability and lashing out have decreased from almost daily to about 3 times per week. She reports being more aware of her triggers and using deep breathing to try to manage distress. She also reports managing grief and loss issues better and reports wearing a shirt in honor of her cousin at a recent event. However, she states she still is not ready and does not feel comfortable talking about cousin.  She also still tries to avoid crying.  Suicidal/Homicidal: Nowithout intent/plan  Therapist Response: Reviewed symptoms,  discussed stressors, facilitated expression of thoughts and feelings, validated feelings, praised and reinforced patient's earlier recognition of triggers of irritability, praised and reinforced patient's efforts to take a pause and use deep breathing, discussed effects, provided psychoeducation on mindfulness and the window of tolerance, discussed rationale for and assisted patient practice grounding techniques when outside of the window of tolerance, developed plan with patient to practice techniques outside of session, facilitated patient expressing thoughts and feelings regarding cousin ,validated and normalized feelings related to grief and loss,  Plan: Return again in 2 weeks.  Diagnosis: Mood disorder  Collaboration of Care: Psychiatrist AEB patient sees psychiatrist Dr. Lolly Mustache for medication management  Patient/Guardian was advised Release of Information must be obtained prior to any record release in order to collaborate their care with an outside provider. Patient/Guardian was advised if they have not already done so to contact the registration department to sign all necessary forms in order for [place] to release information regarding their  care.   Consent: Patient/Guardian gives verbal consent for treatment and assignment of benefits for  services provided during this visit. Patient/Guardian expressed understanding and agreed to proceed.   Adah Salvage, LCSW 03/27/2023

## 2023-03-31 ENCOUNTER — Encounter: Payer: Self-pay | Admitting: Family Medicine

## 2023-03-31 MED ORDER — TIRZEPATIDE-WEIGHT MANAGEMENT 2.5 MG/0.5ML ~~LOC~~ SOLN: 2.5000 mg | SUBCUTANEOUS | 0 refills | Status: DC

## 2023-03-31 NOTE — Telephone Encounter (Signed)
 Spoke with patient and advised her that [facility] isn't covered by her insurance. She is asking if there is another option that insurance will cover. Please advise.

## 2023-03-31 NOTE — Addendum Note (Signed)
 Addended by: Kerri Perches on: 03/31/2023 04:41 PM   Modules accepted: Orders

## 2023-04-03 ENCOUNTER — Other Ambulatory Visit (HOSPITAL_COMMUNITY): Payer: Self-pay

## 2023-04-03 ENCOUNTER — Telehealth: Payer: Self-pay | Admitting: Pharmacy Technician

## 2023-04-03 NOTE — Telephone Encounter (Signed)
 Pharmacy Patient Advocate Encounter   Received notification from CoverMyMeds that prior authorization for ZEPBOUND 2.5MG /0.5ML AUTO-INJECTORS is required/requested.   Insurance verification completed.   The patient is insured through [employer] .   Per test claim: Drug is excluded from coverage.

## 2023-04-04 ENCOUNTER — Encounter: Payer: Self-pay | Admitting: Family Medicine

## 2023-04-04 NOTE — Telephone Encounter (Signed)
 Message sent to pt, and option of qysmia sent for hr consideration

## 2023-04-10 ENCOUNTER — Encounter (HOSPITAL_COMMUNITY): Payer: Self-pay

## 2023-04-10 ENCOUNTER — Ambulatory Visit (INDEPENDENT_AMBULATORY_CARE_PROVIDER_SITE_OTHER): Payer: 59 | Admitting: Psychiatry

## 2023-04-10 DIAGNOSIS — F39 Unspecified mood [affective] disorder: Secondary | ICD-10-CM
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
## 2023-04-11 ENCOUNTER — Telehealth: Payer: Self-pay | Admitting: Family Medicine

## 2023-04-11 NOTE — Telephone Encounter (Signed)
 FMLA  Noted Copied Sleeved (put in provider box)

## 2023-04-17 ENCOUNTER — Encounter: Payer: Self-pay | Admitting: Family Medicine

## 2023-04-17 ENCOUNTER — Telehealth: Payer: Self-pay | Admitting: Family Medicine

## 2023-04-17 ENCOUNTER — Other Ambulatory Visit (HOSPITAL_COMMUNITY): Payer: Self-pay

## 2023-04-17 NOTE — Telephone Encounter (Signed)
 Copied from CRM [id number]. Topic: Medical Record Request - Other >> Apr 17, 2023  3:26 PM Stephanie Cannon wrote: Reason for CRM: Patient calling to double check the fax was received for the [facility] paperwork, and also patient reminder this is completed yearly for work Patient was informed the office received the documents and documents put in the providers box  Patient call number [phone] (M)

## 2023-04-18 ENCOUNTER — Other Ambulatory Visit (HOSPITAL_COMMUNITY): Payer: Self-pay

## 2023-04-18 NOTE — Telephone Encounter (Signed)
 Waiting on a provider to be able to sign. Will inform pt when ready

## 2023-04-18 NOTE — Telephone Encounter (Signed)
 I see an active worker's compensation account for her. Zepbound would not be covered under worker's comp. I also see an active Database administrator. Weight loss meds are not covered under that policy. The [employer] card scanned in with Stephanie Cannon as the cardholder has benefits for him only. Valine has not been covered under that policy since 10/20/2017. Please advise the patient.

## 2023-04-19 ENCOUNTER — Telehealth (HOSPITAL_COMMUNITY): Payer: Self-pay | Admitting: *Deleted

## 2023-04-19 ENCOUNTER — Telehealth (HOSPITAL_COMMUNITY): Payer: Self-pay | Admitting: Psychiatry

## 2023-04-19 ENCOUNTER — Telehealth: Payer: Self-pay | Admitting: Neurology

## 2023-04-19 ENCOUNTER — Ambulatory Visit: Payer: Self-pay | Admitting: Family Medicine

## 2023-04-19 DIAGNOSIS — N9419 Other specified dyspareunia: Secondary | ICD-10-CM | POA: Diagnosis not present

## 2023-04-19 DIAGNOSIS — Z01419 Encounter for gynecological examination (general) (routine) without abnormal findings: Secondary | ICD-10-CM | POA: Diagnosis not present

## 2023-04-19 DIAGNOSIS — K219 Gastro-esophageal reflux disease without esophagitis: Secondary | ICD-10-CM | POA: Diagnosis not present

## 2023-04-19 DIAGNOSIS — I10 Essential (primary) hypertension: Secondary | ICD-10-CM | POA: Diagnosis not present

## 2023-04-19 DIAGNOSIS — Z90710 Acquired absence of both cervix and uterus: Secondary | ICD-10-CM | POA: Diagnosis not present

## 2023-04-19 NOTE — Telephone Encounter (Unsigned)
 Copied from CRM [id number]. Topic: General - Other >> Apr 19, 2023 12:05 PM Baldomero Bone wrote: Reason for CRM: Patient wants to know the date her FMLA needs to be re certified. Callback number is [phone]

## 2023-04-19 NOTE — Telephone Encounter (Signed)
 [place]: [id number] exp. 04/19/23-10/16/23, [employer], [place]  [facility] doesn't schedule since they're second. sent to [facility] [phone] Patient is aware they will call her to schedule.

## 2023-04-19 NOTE — Telephone Encounter (Signed)
 Patient called wanting provider to please give her a call. Per pt she is having palpitations and on edge and very stressed out and need this to be documented. Per pt she would really like for provider to please call her [phone].

## 2023-04-19 NOTE — Telephone Encounter (Signed)
 Pt called re: the scheduling of her MRI

## 2023-04-19 NOTE — Telephone Encounter (Signed)
 Pt called to check on MRI the was send to [place]  [facility]. She had not received a call to schedule appointment

## 2023-04-19 NOTE — Telephone Encounter (Signed)
 Chronic lower to middle back muscle spasms and pain, worsened in past week  Symptoms: 9/10 "constant during a flare up," starting to radiate to leg, numbness/tingling (sees a neurologist for this) Frequency: Intermittent   Disposition:  [x] Appointment(In office)  Additional Notes: Pt states Dr. Rodolph Clap is aware of the back pain. Pt states she is dealing with stress from her current job. Pt states she gets tense which feels like contractions. Pt scheduled for an appointment at PCP office on Friday with a different provider as no availability with PCP this day. This RN educated pt on new-worsening symptoms and when to call back/seek emergent care. Pt verbalized understanding and agrees to plan.     Copied from CRM [id number]. Topic: Clinical - Red Word Triage >> Apr 19, 2023 12:02 PM Baldomero Bone wrote: Red Word that prompted transfer to Nurse Triage: bad back spasm. bad pain; pain level is 9 right now. started last Thursday but unable to take meds and work. Reason for Disposition  Back pain is a chronic symptom (recurrent or ongoing AND present > 4 weeks)  Answer Assessment - Initial Assessment Questions Chronic lower to middle back muscle spasms and pain, worsened in past week  Symptoms: 9/10 "constant during a flare up," starting to radiate to leg, numbness/tingling (sees a neurologist for this)  Frequency: Intermittent  Protocols used: Back Pain-A-AH
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
## 2023-04-21 ENCOUNTER — Encounter: Payer: Self-pay | Admitting: Family Medicine

## 2023-04-21 ENCOUNTER — Ambulatory Visit (INDEPENDENT_AMBULATORY_CARE_PROVIDER_SITE_OTHER): Admitting: Family Medicine

## 2023-04-21 VITALS — BP 128/82 | HR 77 | Ht 61.0 in | Wt 215.1 lb

## 2023-04-21 DIAGNOSIS — M545 Low back pain, unspecified: Secondary | ICD-10-CM

## 2023-04-21 DIAGNOSIS — M5410 Radiculopathy, site unspecified: Secondary | ICD-10-CM

## 2023-04-21 MED ORDER — METHYLPREDNISOLONE ACETATE 40 MG/ML IJ SUSP: 40.0000 mg | Freq: Once | INTRAMUSCULAR | Status: DC

## 2023-04-21 MED ORDER — METHYLPREDNISOLONE ACETATE 80 MG/ML IJ SUSP
40.0000 mg | Freq: Once | INTRAMUSCULAR | Status: AC
  Administered 2023-04-21: 40 mg via INTRAMUSCULAR

## 2023-04-21 MED ORDER — PREDNISONE 20 MG PO TABS
20.0000 mg | ORAL_TABLET | Freq: Two times a day (BID) | ORAL | 0 refills | Status: AC
Start: 2023-04-21 — End: 2023-04-26

## 2023-04-21 NOTE — Patient Instructions (Signed)

## 2023-04-21 NOTE — Progress Notes (Signed)
 Established Patient Office Visit   Subjective  Patient ID: Stephanie Cannon, female    DOB: 1970/07/27  Age: 53 y.o. MRN: [id number]  Chief Complaint  Patient presents with   Back Pain    Pt reports :Since last week she has had tingling, numbness and feels like something is crawling up her leg.Pain radiating down left leg down to foot extreme pain on the side of leg and back of leg.   The spasms are in lower back that are causing mobility issues .     She  has a past medical history of Arthritis, Asthma, Breast discharge, Carpal tunnel syndrome, Chronic back pain, GERD (gastroesophageal reflux disease), Hyperlipidemia, Hypertension, and Obesity.  Back Pain: The patient reports chronic low back pain that began over one year ago and has been persistent since onset. The pain is localized to the lumbar spine and is described as aching, cramping, and stabbing in quality. It radiates to the left leg and is consistently rated at 9/10 in severity, without fluctuation. The pain is aggravated by prolonged sitting, standing, and certain positions. Associated symptoms include left leg pain, numbness, and tingling. The patient denies chest pain or fever. Contributing risk factors include a sedentary lifestyle, lack of regular exercise, and poor posture. She has previously trialed NSAIDs and a muscle relaxant, which provided only mild relief.      Review of Systems  Constitutional:  Negative for chills and fever.  Respiratory:  Negative for shortness of breath.   Cardiovascular:  Negative for chest pain.  Musculoskeletal:  Positive for back pain, joint pain and myalgias.  Neurological:  Positive for tingling. Negative for dizziness.      Objective:     BP 128/82   Pulse 77   Ht 5\' 1"  (1.549 m)   Wt 215 lb 1.9 oz (97.6 kg)   SpO2 95%   BMI 40.65 kg/m  BP Readings from Last 3 Encounters:  04/21/23 128/82  03/21/23 127/81  12/16/22 111/71      Physical Exam Vitals reviewed.   Constitutional:      General: She is not in acute distress.    Appearance: Normal appearance. She is not ill-appearing, toxic-appearing or diaphoretic.  HENT:     Head: Normocephalic.  Eyes:     General:        Right eye: No discharge.        Left eye: No discharge.     Conjunctiva/sclera: Conjunctivae normal.  Cardiovascular:     Rate and Rhythm: Normal rate.     Pulses: Normal pulses.     Heart sounds: Normal heart sounds.  Pulmonary:     Effort: Pulmonary effort is normal. No respiratory distress.     Breath sounds: Normal breath sounds.  Abdominal:     General: Bowel sounds are normal.     Palpations: Abdomen is soft.     Tenderness: There is no abdominal tenderness. There is no guarding.  Musculoskeletal:     Lumbar back: Decreased range of motion. Positive left straight leg raise test. Negative right straight leg raise test.  Skin:    General: Skin is warm and dry.     Capillary Refill: Capillary refill takes less than 2 seconds.  Neurological:     Mental Status: She is alert.  Psychiatric:        Mood and Affect: Mood normal.        Behavior: Behavior normal.      No results found for any visits on  04/21/23.  The 10-year ASCVD risk score (Arnett DK, et al., 2019) is: 3.2%    Assessment & Plan:  Lumbar pain -     methylPREDNISolone  Acetate -     predniSONE ; Take 1 tablet (20 mg total) by mouth 2 (two) times daily with a meal for 5 days.  Dispense: 10 tablet; Refill: 0 -     methylPREDNISolone  Acetate  Back pain with left-sided radiculopathy Assessment & Plan: Depo-Medrol  IM injection given today Can take prednisone  20 mg twice daily x 5 days Follow up with Neurology and MRI appointment  Discussed to focus on maintaining good posture, using lumbar support while sitting, and avoiding prolonged sitting or heavy lifting. Engage in low-impact exercises like walking or swimming to strengthen core muscles and reduce strain on the spine. Apply heat or ice packs as  needed for pain relief and consider physical therapy for targeted exercises.      Return if symptoms worsen or fail to improve, for Follow up with PCP next available appointment.   Avelino Lek Amber Bail, FNP

## 2023-04-21 NOTE — Assessment & Plan Note (Signed)
 Depo-Medrol  IM injection given today Can take prednisone  20 mg twice daily x 5 days Follow up with Neurology and MRI appointment  Discussed to focus on maintaining good posture, using lumbar support while sitting, and avoiding prolonged sitting or heavy lifting. Engage in low-impact exercises like walking or swimming to strengthen core muscles and reduce strain on the spine. Apply heat or ice packs as needed for pain relief and consider physical therapy for targeted exercises.

## 2023-04-24 ENCOUNTER — Ambulatory Visit (INDEPENDENT_AMBULATORY_CARE_PROVIDER_SITE_OTHER): Payer: 59 | Admitting: Psychiatry

## 2023-04-24 DIAGNOSIS — F39 Unspecified mood [affective] disorder: Secondary | ICD-10-CM

## 2023-04-24 DIAGNOSIS — F411 Generalized anxiety disorder: Secondary | ICD-10-CM | POA: Diagnosis not present
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
## 2023-04-27 ENCOUNTER — Encounter: Payer: Self-pay | Admitting: Neurology

## 2023-04-28 ENCOUNTER — Other Ambulatory Visit: Payer: Self-pay | Admitting: Neurology

## 2023-04-30 ENCOUNTER — Other Ambulatory Visit: Payer: Self-pay | Admitting: Family Medicine

## 2023-05-03 ENCOUNTER — Encounter: Payer: Self-pay | Admitting: Family Medicine

## 2023-05-03 DIAGNOSIS — R1031 Right lower quadrant pain: Secondary | ICD-10-CM

## 2023-05-04 ENCOUNTER — Encounter: Payer: Self-pay | Admitting: Family Medicine

## 2023-05-05 ENCOUNTER — Other Ambulatory Visit

## 2023-05-08 ENCOUNTER — Other Ambulatory Visit (HOSPITAL_COMMUNITY): Payer: Self-pay | Admitting: Psychiatry

## 2023-05-08 DIAGNOSIS — F4321 Adjustment disorder with depressed mood: Secondary | ICD-10-CM

## 2023-05-08 DIAGNOSIS — F39 Unspecified mood [affective] disorder: Secondary | ICD-10-CM

## 2023-05-08 DIAGNOSIS — F5101 Primary insomnia: Secondary | ICD-10-CM

## 2023-05-08 DIAGNOSIS — F411 Generalized anxiety disorder: Secondary | ICD-10-CM

## 2023-05-11 ENCOUNTER — Ambulatory Visit
Admission: RE | Admit: 2023-05-11 | Discharge: 2023-05-11 | Disposition: A | Discharge status: Home / Self Care | Source: Ambulatory Visit | Attending: Neurology | Admitting: Neurology

## 2023-05-11 ENCOUNTER — Encounter (HOSPITAL_COMMUNITY): Payer: Self-pay | Admitting: Psychiatry

## 2023-05-11 ENCOUNTER — Telehealth (HOSPITAL_BASED_OUTPATIENT_CLINIC_OR_DEPARTMENT_OTHER): Payer: 59 | Admitting: Psychiatry

## 2023-05-11 DIAGNOSIS — F411 Generalized anxiety disorder: Secondary | ICD-10-CM | POA: Diagnosis not present

## 2023-05-11 DIAGNOSIS — F5101 Primary insomnia: Secondary | ICD-10-CM

## 2023-05-11 DIAGNOSIS — F39 Unspecified mood [affective] disorder: Secondary | ICD-10-CM | POA: Diagnosis not present

## 2023-05-11 DIAGNOSIS — G5712 Meralgia paresthetica, left lower limb: Secondary | ICD-10-CM | POA: Diagnosis not present

## 2023-05-11 DIAGNOSIS — R32 Unspecified urinary incontinence: Secondary | ICD-10-CM

## 2023-05-11 DIAGNOSIS — R202 Paresthesia of skin: Secondary | ICD-10-CM

## 2023-05-11 DIAGNOSIS — F4321 Adjustment disorder with depressed mood: Secondary | ICD-10-CM

## 2023-05-11 MED ORDER — LAMOTRIGINE 200 MG PO TABS: ORAL_TABLET | ORAL | 0 refills | Status: DC

## 2023-05-11 MED ORDER — AMITRIPTYLINE HCL 100 MG PO TABS: 100.0000 mg | ORAL_TABLET | Freq: Every day | ORAL | 0 refills | Status: DC

## 2023-05-11 NOTE — Progress Notes (Signed)
 [facility] MD Virtual Progress Note   Patient Location: In Car Provider Location: Office  I connect with patient by video and verified that I am speaking with correct person by using two identifiers. I discussed the limitations of evaluation and management by telemedicine and the availability of in person appointments. I also discussed with the patient that there may be a patient responsible charge related to this service. The patient expressed understanding and agreed to proceed.  Stephanie Cannon [id number] 53 y.o.  05/11/2023 3:27 PM  History of Present Illness:  Patient is evaluated by video session.  She is reported a lot of stress because very disappointed with the coworkers.  Patient resume work on April 4 and she feels working in a hostile environment as no one talks to her.  She is not sure why but she feel abandoned by her coworkers.  Patient was on medical leave since October after she had a fall and recently came back to work.  Patient still have muscle spasm and chronic pain.  She is taking BuSpar  5 mg 3 times a day prescribed by PCP.  She also takes Xanax  which help her sleep.  She is getting Lamictal  and amitriptyline  from our office to help her sleep and mood.  She admitted get some time irritability.  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  Patient does not want to change the medication because she has invested 15 years in the state system.  However she is looking another job within the state.  Patient admitted there are times that she could not sleep very well when she think about her job environment.  She is in therapy with Fayne Hoover.  She still have a lot of grief about her cousin who died last year.  She recently has visit with primary care.  She has no rash, itching, tremors or shakes.  She denies any paranoia, hallucination, suicidal thoughts or homicidal thoughts.  Her appetite is okay.  Her weight is fair.  Past  Psychiatric History: No h/o inpatient treatment or suicidal attempt.  Saw Dr. Avanell Bob twice but no further follow-ups.  Saw therapist but reported to help her son. Tried Prozac  temazepam , Ambien, melatonin and recently BuSpar . No history of psychosis.    Outpatient Encounter Medications as of 05/11/2023  Medication Sig   albuterol  (PROVENTIL ) (2.5 MG/3ML) 0.083% nebulizer solution Take 3 mLs (2.5 mg total) by nebulization every 6 (six) hours as needed for wheezing or shortness of breath.   albuterol  (VENTOLIN  HFA) 108 (90 Base) MCG/ACT inhaler Inhale 2 puffs into the lungs every 6 (six) hours as needed for wheezing or shortness of breath.   ALPRAZolam  (XANAX ) 1 MG tablet Take 1 tablet (1 mg total) by mouth at bedtime.   amitriptyline  (ELAVIL ) 75 MG tablet Take 1 tablet (75 mg total) by mouth at bedtime.   azelastine  (ASTELIN ) 0.1 % nasal spray Place 2 sprays into both nostrils 2 (two) times daily. Use in each nostril as directed (Patient not taking: Reported on 04/21/2023)   busPIRone  (BUSPAR ) 5 MG tablet Take 1 tablet (5 mg total) by mouth 3 (three) times daily.   celecoxib  (CELEBREX ) 100 MG capsule Take 1 capsule (100 mg total) by mouth 2 (two) times daily.   Cranberry 50 MG CHEW Chew 1 tablet by mouth in the morning.   cyclobenzaprine  (FLEXERIL ) 10 MG tablet Take 1 tablet (10 mg total) by mouth at bedtime.   DULoxetine  (CYMBALTA ) 60 MG  capsule Take 1 capsule (60 mg total) by mouth daily.   DULoxetine  (CYMBALTA ) 60 MG capsule Take 60 mg by mouth daily.   DULoxetine  (CYMBALTA ) 60 MG capsule TAKE ONE CAPSULE BY MOUTH ONCE DAILY.   esomeprazole  (NEXIUM ) 40 MG capsule Take 1 capsule (40 mg total) by mouth daily.   esomeprazole  (NEXIUM ) 40 MG capsule Take 40 mg by mouth daily.   ezetimibe  (ZETIA ) 10 MG tablet Take 1 tablet (10 mg total) by mouth daily.   ibuprofen  (ADVIL ) 800 MG tablet TAKE ONE TABLET BY MOUTH EVERY (8) HOURS AS NEEDED.   lamoTRIgine  (LAMICTAL ) 200 MG tablet Take 1 tab daily (Patient  not taking: Reported on 04/21/2023)   Multiple Vitamins-Minerals (MULTIVITAMIN GUMMIES ADULT PO) Take 1 each by mouth 2 (two) times daily.   Multiple Vitamins-Minerals (ONE-A-DAY 50 PLUS PO) Take 2 tablets by mouth daily with breakfast.   potassium chloride  (KLOR-CON  M) 10 MEQ tablet Take 1 tablet (10 mEq total) by mouth 2 (two) times daily.   potassium chloride  (KLOR-CON ) 10 MEQ tablet Take 10 mEq by mouth 2 (two) times daily.   tirzepatide (ZEPBOUND) 2.5 MG/0.5ML injection vial Inject 2.5 mg into the skin once a week. (Patient not taking: Reported on 04/21/2023)   triamterene -hydrochlorothiazide  (MAXZIDE ) 75-50 MG tablet Take 1 tablet by mouth daily.   Facility-Administered Encounter Medications as of 05/11/2023  Medication   methylPREDNISolone  acetate (DEPO-MEDROL ) injection 40 mg    Recent Results (from the past 2160 hours)  Lipid panel     Status: Abnormal   Collection Time: 03/20/23  3:23 PM  Result Value Ref Range   Cholesterol, Total 247 (H) 100 - 199 mg/dL   Triglycerides 58 0 - 149 mg/dL   HDL 85 >16 mg/dL   VLDL Cholesterol Cal 9 5 - 40 mg/dL   LDL Chol Calc (NIH) 109 (H) 0 - 99 mg/dL   Chol/HDL Ratio 2.9 0.0 - 4.4 ratio    Comment:                                   T. Chol/HDL Ratio                                             Men  Women                               1/2 Avg.Risk  3.4    3.3                                   Avg.Risk  5.0    4.4                                2X Avg.Risk  9.6    7.1                                3X Avg.Risk 23.4   11.0   CMP14+EGFR     Status: None   Collection Time: 03/20/23  3:23 PM  Result Value Ref Range   Glucose 82 70 -  99 mg/dL   BUN 14 6 - 24 mg/dL   Creatinine, Ser 7.82 0.57 - 1.00 mg/dL   eGFR 84 >95 AO/ZHY/8.65   BUN/Creatinine Ratio 17 9 - 23   Sodium 139 134 - 144 mmol/L   Potassium 3.5 3.5 - 5.2 mmol/L   Chloride 97 96 - 106 mmol/L   CO2 25 20 - 29 mmol/L   Calcium 9.7 8.7 - 10.2 mg/dL   Total Protein 7.6 6.0 - 8.5  g/dL   Albumin  4.4 3.8 - 4.9 g/dL   Globulin, Total 3.2 1.5 - 4.5 g/dL   Bilirubin Total 0.4 0.0 - 1.2 mg/dL   Alkaline Phosphatase 117 44 - 121 IU/L   AST 29 0 - 40 IU/L   ALT 19 0 - 32 IU/L  CBC     Status: None   Collection Time: 03/20/23  3:23 PM  Result Value Ref Range   WBC 7.1 3.4 - 10.8 x10E3/uL   RBC 4.85 3.77 - 5.28 x10E6/uL   Hemoglobin 15.2 11.1 - 15.9 g/dL   Hematocrit 78.4 69.6 - 46.6 %   MCV 92 79 - 97 fL   MCH 31.3 26.6 - 33.0 pg   MCHC 34.2 31.5 - 35.7 g/dL   RDW 29.5 28.4 - 13.2 %   Platelets 251 150 - 450 x10E3/uL     Psychiatric Specialty Exam: Physical Exam  Review of Systems  Musculoskeletal:  Positive for back pain.       Muscle spasm    Weight 215 lb (97.5 kg).There is no height or weight on file to calculate BMI.  General Appearance: Casual  Eye Contact:  Fair  Speech:  Pressured  Volume:  Increased  Mood:  Anxious and Irritable  Affect:  Labile  Thought Process:  Descriptions of Associations: Intact  Orientation:  Full (Time, Place, and Person)  Thought Content:  Rumination  Suicidal Thoughts:  No  Homicidal Thoughts:  No  Memory:  Immediate;   Fair Recent;   Fair Remote;   Good  Judgement:  Fair  Insight:  Shallow  Psychomotor Activity:  Increased  Concentration:  Concentration: Good and Attention Span: Fair  Recall:  Good  Fund of Knowledge:  Good  Language:  Good  Akathisia:  No  Handed:  Right  AIMS (if indicated):     Assets:  Communication Skills Desire for Improvement Housing Resilience Transportation  ADL's:  Intact  Cognition:  WNL  Sleep:  fair       04/21/2023    3:23 PM 12/12/2022    3:07 PM 11/15/2022   11:08 AM 11/02/2022    9:09 AM 06/06/2022    2:28 PM  Depression screen PHQ 2/9  Decreased Interest 1 3 0 1 0  Down, Depressed, Hopeless 2 1 1 1  0  PHQ - 2 Score 3 4 1 2  0  Altered sleeping 3 3 2 1  0  Tired, decreased energy 2 1 3 1  0  Change in appetite 1 1 3  0 0  Feeling bad or failure about yourself  1 1  0 1 0  Trouble concentrating 3 3 1 1  0  Moving slowly or fidgety/restless 3 3 2 1  0  Suicidal thoughts 1 0 0 0 0  PHQ-9 Score 17 16 12 7  0  Difficult doing work/chores Extremely dIfficult   Very difficult     Assessment/Plan: Mood disorder (HCC) - Plan: amitriptyline  (ELAVIL ) 100 MG tablet, lamoTRIgine  (LAMICTAL ) 200 MG tablet  GAD (generalized anxiety disorder) - Plan: amitriptyline  (ELAVIL )  100 MG tablet, lamoTRIgine  (LAMICTAL ) 200 MG tablet  Primary insomnia - Plan: amitriptyline  (ELAVIL ) 100 MG tablet  Grief - Plan: amitriptyline  (ELAVIL ) 100 MG tablet  Reviewed blood work results and collateral information from other provider.  Recommend to try amitriptyline  100 mg to help her sleep and anxiety.  She is also on Xanax , Cymbalta  and BuSpar  from other providers.  Will continue Lamictal  200 mg daily.  Encouraged to continue therapy with Fayne Hoover.  She is still going through grief and despite taking medication and still some time mood lability.  Patient seeing neurology for her chronic pain.  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  I discussed polypharmacy and recommend if increased amitriptyline  did not help then we may need to switch her medication and consider Seroquel.  I encouraged to call us  back if she is any question or any concern.  Follow-up in 3 months.   Follow Up Instructions:     I discussed the assessment and treatment plan with the patient. The patient was provided an opportunity to ask questions and all were answered. The patient agreed with the plan and demonstrated an understanding of the instructions.   The patient was advised to call back or seek an in-person evaluation if the symptoms worsen or if the condition fails to improve as anticipated.    Collaboration of Care: Other provider involved in patient's care AEB notes are available in epic to review  Patient/Guardian was advised Release of Information must be obtained prior to any record  release in order to collaborate their care with an outside provider. Patient/Guardian was advised if they have not already done so to contact the registration department to sign all necessary forms in order for us  to release information regarding their care.   Consent: Patient/Guardian gives verbal consent for treatment and assignment of benefits for services provided during this visit. Patient/Guardian expressed understanding and agreed to proceed.     Total encounter time 24 minutes which includes face-to-face time, chart reviewed, care coordination, order entry and documentation during this encounter.   Note: This document was prepared by [employer] voice dictation technology and any errors that results from this process are unintentional.    Arturo Late, MD 05/11/2023

## 2023-05-12 ENCOUNTER — Telehealth: Payer: Self-pay | Admitting: Family Medicine

## 2023-05-12 ENCOUNTER — Encounter: Payer: Self-pay | Admitting: Family Medicine

## 2023-05-12 NOTE — Telephone Encounter (Signed)
 Copied from CRM [id number]. Topic: General - Other >> May 12, 2023 11:07 AM [place] wrote: Reason for CRM: FMLA paperwork was not filled out correctly- sent new paperwork- please call patient to discuss  [phone]

## 2023-05-12 NOTE — Telephone Encounter (Signed)
 Have been discussing this via mychart

## 2023-05-12 NOTE — Telephone Encounter (Signed)
 New FMLA forms received Placed in pcp box Copy in folder

## 2023-05-17 ENCOUNTER — Ambulatory Visit: Payer: Self-pay | Admitting: Neurology

## 2023-05-18 NOTE — Telephone Encounter (Signed)
  Worsening low back pain and urinary urgency, lower extremity discomfort,             Likely due to combination of deconditioning, obesity, also had a history of of lumbar decompression surgery, can be residual symptoms from previous lumbar stenosis             MRI of the lumbar spine showed stable anterior lumbar fusion at L4-5,             EMG nerve conduction study on February 15, 2023 showed no evidence of active bilateral lumbosacral radiculopathy,  There was no significant pathology found with extensive evaluation we had so far.   Would suggest her continue observe.

## 2023-05-22 ENCOUNTER — Ambulatory Visit (INDEPENDENT_AMBULATORY_CARE_PROVIDER_SITE_OTHER): Admitting: Psychiatry

## 2023-05-22 DIAGNOSIS — F411 Generalized anxiety disorder: Secondary | ICD-10-CM | POA: Diagnosis not present

## 2023-05-22 DIAGNOSIS — F39 Unspecified mood [affective] disorder: Secondary | ICD-10-CM | POA: Diagnosis not present

## 2023-05-22 NOTE — Progress Notes (Signed)
 IN-PERSON  THERAPIST PROGRESS NOTE  Session Time: Monday 05/22/2023 4:06 PM - 4:46 PM  Participation Level: Active  Behavioral Response: CasualAlertless anxious, less depressed,   Type of Therapy: Individual Therapy  Treatment Goals addressed:   Reduce episodes of irritability and lashing out to 1 x every 2 weeks for 30 days per pt's report      Pt will learn and implement relaxation techniques, will practice a relaxation technique daily    ProgressTowards Goals: progressing   Interventions: Supportive, CBT   Summary: Stephanie Cannon is a 53 y.o. female who is referred for services by PCP Dr. Rodolph Clap due to pt experiencing symptoms of anxiety. Pt sees psychiatrist Dr. Arfeen for medication management. Pt denies any psychiatric hospitalizations. Pt participated in teleheath counseling with son last year. Pt reports experiencing grief and loss issues related to the death  of a cousin who was like a sister in July 2024. She states she can't hardly talk about her without becoming very emotional. She also reports stress related to recently being injured on the job. She says she has been made to feel like "I am the blame" by her employer. She reports financial stress. She reports additional stress regarding conflict between son and husband resulting in them not talking to leach other since July 2024. She also worries about her mother as she suspects mother may have dementia. Pt's current symptoms include sadness, worry, tearfulness,  difficulty concentration, fatigue, irritability, sleep difficulty, and restlessness.          Patient last was seen about 4 weeks ago.  She reports continued stress regarding her work environment but managing better.  She reports continued limited communication from her coworkers.  She reports adjusting to working in an area away from her coworkers and reports focusing on her work.  She still has concerns about the way she has been treated and plans to meet with someone  in HR.  She also reports she is scheduled for shoulder surgery on June 5 due to her work related injury.  She anticipates she will be out of work for at least 6 weeks and expresses some concern about how she will be treated once she returns to work.  She reports she has continued to practice deep breathing as a relaxation technique.  She has been more mindful regarding interactions at home and with her family.  She reports episodes of irritability and lashing out have been reduced to about 2 or 3 every 2 weeks.   Suicidal/Homicidal: Nowithout intent/plan  Therapist Response: Reviewed symptoms,  discussed stressors, facilitated expression of thoughts and feelings, validated feelings, praised and reinforced patient's efforts to practice relaxation techniques, discussed effects of use, encouraged patient to continue practicing relaxation techniques   Plan: Return again in 2 weeks.  Diagnosis: Mood disorder  Collaboration of Care: Psychiatrist AEB patient sees psychiatrist Dr. Arfeen for medication management  Patient/Guardian was advised Release of Information must be obtained prior to any record release in order to collaborate their care with an outside provider. Patient/Guardian was advised if they have not already done so to contact the registration department to sign all necessary forms in order for us  to release information regarding their care.   Consent: Patient/Guardian gives verbal consent for treatment and assignment of benefits for services provided during this visit. Patient/Guardian expressed understanding and agreed to proceed.   Dicie Foster, LCSW 05/22/2023

## 2023-05-23 ENCOUNTER — Other Ambulatory Visit: Payer: Self-pay | Admitting: Family Medicine

## 2023-05-25 ENCOUNTER — Encounter: Payer: Self-pay | Admitting: Family Medicine

## 2023-05-25 ENCOUNTER — Telehealth: Payer: Self-pay | Admitting: Family Medicine

## 2023-05-25 DIAGNOSIS — Z01818 Encounter for other preprocedural examination: Secondary | ICD-10-CM

## 2023-05-25 DIAGNOSIS — I10 Essential (primary) hypertension: Secondary | ICD-10-CM

## 2023-05-25 DIAGNOSIS — Z01811 Encounter for preprocedural respiratory examination: Secondary | ICD-10-CM

## 2023-05-25 NOTE — Telephone Encounter (Signed)
 Direct contact made with pt re neemd for med clearancefor upcoming surgery now planned for June 6 per pt She is to have this week/ asap CXR, cBC, chem 7 and UA, orders placed, will get appt next week for in office eval, needs to bring all meds to visit

## 2023-05-26 ENCOUNTER — Ambulatory Visit (HOSPITAL_COMMUNITY)
Admission: RE | Admit: 2023-05-26 | Discharge: 2023-05-26 | Disposition: A | Discharge status: Home / Self Care | Source: Ambulatory Visit | Attending: Family Medicine | Admitting: Family Medicine

## 2023-05-26 DIAGNOSIS — Z9049 Acquired absence of other specified parts of digestive tract: Secondary | ICD-10-CM | POA: Diagnosis not present

## 2023-05-26 DIAGNOSIS — R1031 Right lower quadrant pain: Secondary | ICD-10-CM | POA: Insufficient documentation

## 2023-05-26 DIAGNOSIS — Z90710 Acquired absence of both cervix and uterus: Secondary | ICD-10-CM | POA: Diagnosis not present

## 2023-05-28 ENCOUNTER — Ambulatory Visit: Payer: Self-pay | Admitting: Family Medicine

## 2023-05-30 ENCOUNTER — Ambulatory Visit (HOSPITAL_COMMUNITY)

## 2023-05-31 ENCOUNTER — Ambulatory Visit (INDEPENDENT_AMBULATORY_CARE_PROVIDER_SITE_OTHER): Admitting: Family Medicine

## 2023-05-31 ENCOUNTER — Encounter: Payer: Self-pay | Admitting: Family Medicine

## 2023-05-31 VITALS — BP 148/86 | HR 76 | Resp 18 | Ht 61.0 in | Wt 219.0 lb

## 2023-05-31 DIAGNOSIS — Z01818 Encounter for other preprocedural examination: Secondary | ICD-10-CM

## 2023-05-31 DIAGNOSIS — R109 Unspecified abdominal pain: Secondary | ICD-10-CM

## 2023-05-31 DIAGNOSIS — N3000 Acute cystitis without hematuria: Secondary | ICD-10-CM | POA: Insufficient documentation

## 2023-05-31 NOTE — Patient Instructions (Addendum)
 F/U in mid September call if you need me sooner  STOP celebrex   Take buspar  at least twice daily, may take up to 3 times daily as prescribed  Pls sign for x ray report that you had done on your job  today  Urine for C/S only from office today  Cleared medically dfor proposed surgery, all the best!  All the best with upcoming surgery you are medically cleared , form will be faxed  Take augmentin  for 5 days as prescribed earlier today for UTI  Thanks for choosing [facility], we consider it a privelige to serve you.

## 2023-06-01 ENCOUNTER — Emergency Department (HOSPITAL_COMMUNITY)

## 2023-06-01 ENCOUNTER — Other Ambulatory Visit: Payer: Self-pay

## 2023-06-01 ENCOUNTER — Encounter (HOSPITAL_COMMUNITY): Payer: Self-pay

## 2023-06-01 ENCOUNTER — Emergency Department (HOSPITAL_COMMUNITY)
Admission: EM | Admit: 2023-06-01 | Discharge: 2023-06-01 | Disposition: A | Discharge status: Home / Self Care | Attending: Emergency Medicine | Admitting: Emergency Medicine

## 2023-06-01 DIAGNOSIS — Z79899 Other long term (current) drug therapy: Secondary | ICD-10-CM | POA: Insufficient documentation

## 2023-06-01 DIAGNOSIS — J45909 Unspecified asthma, uncomplicated: Secondary | ICD-10-CM | POA: Diagnosis not present

## 2023-06-01 DIAGNOSIS — I10 Essential (primary) hypertension: Secondary | ICD-10-CM | POA: Diagnosis not present

## 2023-06-01 DIAGNOSIS — R109 Unspecified abdominal pain: Secondary | ICD-10-CM | POA: Insufficient documentation

## 2023-06-01 DIAGNOSIS — Z01818 Encounter for other preprocedural examination: Secondary | ICD-10-CM | POA: Insufficient documentation

## 2023-06-01 LAB — COMPREHENSIVE METABOLIC PANEL WITH GFR
ALT: 22 U/L (ref 0–44)
AST: 24 U/L (ref 15–41)
Albumin: 4.2 g/dL (ref 3.5–5.0)
Alkaline Phosphatase: 96 U/L (ref 38–126)
Anion gap: 11 (ref 5–15)
BUN: 18 mg/dL (ref 6–20)
CO2: 27 mmol/L (ref 22–32)
Calcium: 9.7 mg/dL (ref 8.9–10.3)
Chloride: 98 mmol/L (ref 98–111)
Creatinine, Ser: 0.96 mg/dL (ref 0.44–1.00)
GFR, Estimated: >60 mL/min (ref >60)
Glucose, Bld: 83 mg/dL (ref 70–99)
Potassium: 3.8 mmol/L (ref 3.5–5.1)
Sodium: 136 mmol/L (ref 135–145)
Total Bilirubin: 0.5 mg/dL (ref 0.0–1.2)
Total Protein: 8.5 g/dL — ABNORMAL HIGH (ref 6.5–8.1)

## 2023-06-01 LAB — CBC WITH DIFFERENTIAL/PLATELET
Abs Immature Granulocytes: 0.02 10*3/uL (ref 0.00–0.07)
Basophils Absolute: 0 10*3/uL (ref 0.0–0.1)
Basophils Relative: 1 %
Eosinophils Absolute: 0.1 10*3/uL (ref 0.0–0.5)
Eosinophils Relative: 1 %
HCT: 45.9 % (ref 36.0–46.0)
Hemoglobin: 15.2 g/dL — ABNORMAL HIGH (ref 12.0–15.0)
Immature Granulocytes: 0 %
Lymphocytes Relative: 27 %
Lymphs Abs: 1.8 10*3/uL (ref 0.7–4.0)
MCH: 31.2 pg (ref 26.0–34.0)
MCHC: 33.1 g/dL (ref 30.0–36.0)
MCV: 94.3 fL (ref 80.0–100.0)
Monocytes Absolute: 0.4 10*3/uL (ref 0.1–1.0)
Monocytes Relative: 6 %
Neutro Abs: 4.6 10*3/uL (ref 1.7–7.7)
Neutrophils Relative %: 65 %
Platelets: 260 10*3/uL (ref 150–400)
RBC: 4.87 MIL/uL (ref 3.87–5.11)
RDW: 13.6 % (ref 11.5–15.5)
WBC: 6.9 10*3/uL (ref 4.0–10.5)
nRBC: 0 % (ref 0.0–0.2)

## 2023-06-01 LAB — URINALYSIS, W/ REFLEX TO CULTURE (INFECTION SUSPECTED)
Bacteria, UA: NONE SEEN
Bilirubin Urine: NEGATIVE
Glucose, UA: NEGATIVE mg/dL
Hgb urine dipstick: NEGATIVE
Ketones, ur: NEGATIVE mg/dL
Leukocytes,Ua: NEGATIVE
Nitrite: NEGATIVE
Protein, ur: NEGATIVE mg/dL
Specific Gravity, Urine: 1.005 (ref 1.005–1.030)
pH: 7 (ref 5.0–8.0)

## 2023-06-01 MED ORDER — OXYCODONE-ACETAMINOPHEN 5-325 MG PO TABS
1.0000 | ORAL_TABLET | Freq: Once | ORAL | Status: AC
  Administered 2023-06-01: 1 via ORAL
  Filled 2023-06-01: qty 1

## 2023-06-01 MED ORDER — KETOROLAC TROMETHAMINE 15 MG/ML IJ SOLN
15.0000 mg | Freq: Once | INTRAMUSCULAR | Status: AC
  Administered 2023-06-01: 15 mg via INTRAVENOUS
  Filled 2023-06-01: qty 1

## 2023-06-01 NOTE — Progress Notes (Signed)
   Stephanie Cannon     MRN: [id number]      DOB: Apr 15, 1970  Chief Complaint  Patient presents with   Hypertension    Follow up    Pre-op Exam    Scheduled for shoulder surgery on 06/08/23    HPI Stephanie Cannon is here for fpre op eval and clearance for upcoming right shoulder surgery   ROS Denies recent fever or chills. Denies sinus pressure, nasal congestion, ear pain or sore throat. Denies chest congestion, productive cough or wheezing. Denies chest pains, palpitations , PND , orthopnea or leg swelling ROS is generally negative except for her uncontrolled shoulder pain with limitation in mobility HOWEVER on the day of this visit, states she had severe right flank pain radiating to groin, colicky which took her to on site clinic at her job, has CCUA , CBC and chem 7 also had an ray Was advised she was constipated and  may have kidney stones. 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   . Denies headaches, seizures, numbness, or tingling. Denies uncontrolled depression, anxiety or insomnia. Denies skin break down or rash.   PE  BP (!) 148/86   Pulse 76   Resp 18   Ht 5\' 1"  (1.549 m)   Wt 219 lb 0.6 oz (99.4 kg)   SpO2 97%   BMI 41.39 kg/m   Patient alert and oriented and in no cardiopulmonary distress.  HEENT: No facial asymmetry, EOMI,     Neck supple .  Chest: Clear to auscultation bilaterally.  CVS: S1, S2 no murmurs, no S3.Regular rate. EKG: NSR, no ischemia or LVH  ABD: Soft non tender.   Ext: No edema  MS: Adequate though reduced  ROM spine, shoulders, hips and  knees.  Skin: Intact, no ulcerations or rash noted.  Psych: Good eye contact, normal affect. Memory intact not anxious or depressed appearing.  CNS: CN 2-12 intact, power,  normal throughout.no focal deficits noted.   Assessment & Plan  Pre-op exam Recent cbc and kidney function from today's visit at her workplace clinic are normal UA suggests infection, will send urine from office for c/S only, she is to take Augmentin  prescribed x 5 days EKG in office normal and no cardiac complaints. BP elevated at visit , however stressful am and new pain, generally controlled , no med change Medically cleared for right shoulder surgery  Flank pain, acute Obtain US  of kidneys and report from x ray done at outside clinic

## 2023-06-01 NOTE — ED Triage Notes (Signed)
 Pt arrived via POV following an appointment with Dr Rodolph Clap yesterday for further evaluation of right flank pain. Pt reports Dr Rodolph Clap has ordered her to have a US  Renal Study and DG Chest 2 view.

## 2023-06-01 NOTE — Assessment & Plan Note (Signed)
 Recent cbc and kidney function from today's visit at her workplace clinic are normal UA suggests infection, will send urine from office for c/S only, she is to take Augmentin  prescribed x 5 days EKG in office normal and no cardiac complaints. BP elevated at visit , however stressful am and new pain, generally controlled , no med change Medically cleared for right shoulder surgery

## 2023-06-01 NOTE — ED Triage Notes (Signed)
 Pt reports flank pain has been present for 2 weeks and denies hematuria.

## 2023-06-01 NOTE — ED Provider Notes (Signed)
 [place] [facility] AT [facility] Provider Note   CSN: [id number] Arrival date & time: 06/01/23  1026     History  Chief Complaint  Patient presents with   Flank Pain    Stephanie Cannon is a 53 y.o. female.   Flank Pain  Patient Stephanie Cannon with right-sided flank pain.  Has had for around 2 weeks now.  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  Also saw Dr. Rodolph Cannon who ordered outpatient renal ultrasound and chest x-ray.  Had a CT scan done just over a week ago for stones that did not show clear cause.  Some dysuria.  No nausea or vomiting.  Previous cholecystectomy.  Has had 2 doses of amoxicillin  and continues pain.    Past Medical History:  Diagnosis Date   Arthritis    Asthma    [employer]reesia 07/31/2019   Breast discharge    left spont white discharge x 6 months   Carpal tunnel syndrome    left, wears brace at night   Chronic back pain    GERD (gastroesophageal reflux disease)    Hyperlipidemia    Hypertension    Obesity     Home Medications Prior to Admission medications   Medication Sig Start Date End Date Taking? Authorizing Provider  ALPRAZolam  (XANAX ) 1 MG tablet Take 1 tablet (1 mg total) by mouth at bedtime. 03/21/23 09/17/23  Stephanie Fret, MD  amitriptyline  (ELAVIL ) 100 MG tablet Take 1 tablet (100 mg total) by mouth at bedtime. 05/11/23   Arfeen, Stephanie Canny, MD  busPIRone  (BUSPAR ) 5 MG tablet Take 1 tablet (5 mg total) by mouth 3 (three) times daily. 02/22/23   Stephanie Fret, MD  Cranberry 50 MG CHEW Chew 1 tablet by mouth in the morning.    [provider]  cyclobenzaprine  (FLEXERIL ) 10 MG tablet Take 1 tablet (10 mg total) by mouth at bedtime. 02/22/23   Stephanie Fret, MD  DULoxetine  (CYMBALTA ) 60 MG capsule Take 1 capsule (60 mg total) by mouth daily. 05/10/22   Stephanie Brasil, MD  DULoxetine  (CYMBALTA ) 60 MG capsule Take 60 mg by mouth daily.     [provider]  DULoxetine  (CYMBALTA ) 60 MG capsule TAKE ONE CAPSULE BY MOUTH ONCE DAILY. 05/01/23   Stephanie Brasil, MD  esomeprazole  (NEXIUM ) 40 MG capsule Take 1 capsule (40 mg total) by mouth daily. 02/22/23   Stephanie Fret, MD  ezetimibe  (ZETIA ) 10 MG tablet Take 1 tablet (10 mg total) by mouth daily. 03/21/23   Stephanie Fret, MD  lamoTRIgine  (LAMICTAL ) 200 MG tablet Take 1 tab daily 05/11/23   Arfeen, Stephanie T, MD  Multiple Vitamins-Minerals (MULTIVITAMIN GUMMIES ADULT PO) Take 1 each by mouth 2 (two) times daily.    [provider]  Multiple Vitamins-Minerals (ONE-A-DAY 50 PLUS PO) Take 2 tablets by mouth daily with breakfast.    [provider]  potassium chloride  (KLOR-CON ) 10 MEQ tablet Take 10 mEq by mouth 2 (two) times daily.    [provider]  triamterene -hydrochlorothiazide  (MAXZIDE ) 75-50 MG tablet Take 1 tablet by mouth daily. 03/16/23   Stephanie Fret, MD      Allergies    Codeine and Morphine    Review of Systems   Review of Systems  Genitourinary:  Positive for flank pain.    Physical Exam Updated Vital Signs BP 139/63 (BP Location: Right Arm)  Pulse 72   Temp (!) 97.5 F (36.4 C) (Temporal)   Resp 18   Ht 5\' 1"  (1.549 m)   Wt 99.4 kg   SpO2 100%   BMI 41.39 kg/m  Physical Exam Vitals and nursing note reviewed.  Cardiovascular:     Rate and Rhythm: Regular rhythm.  Pulmonary:     Breath sounds: No wheezing.  Abdominal:     Tenderness: There is no abdominal tenderness.  Musculoskeletal:     Comments: No CVA tenderness  Neurological:     General: No focal deficit present.     Mental Status: She is alert. Mental status is at baseline.     ED Results / Procedures / Treatments   Labs (all labs ordered are listed, but only abnormal results are displayed) Labs Reviewed  CBC WITH DIFFERENTIAL/PLATELET  COMPREHENSIVE METABOLIC PANEL WITH GFR  URINALYSIS, W/ REFLEX TO CULTURE (INFECTION SUSPECTED)     EKG None  Radiology No results found.  Procedures Procedures    Medications Ordered in ED Medications - No data to display  ED Course/ Medical Decision Making/ A&P                                 Medical Decision Making Amount and/or Complexity of Data Reviewed Labs: ordered. Radiology: ordered.  Risk Prescription drug management.   Patient with right flank pain.  Reportedly has UTI as an outpatient test although I cannot see the results of this.  Has been on antibiotics.  Did have recent CT scan that showed no stones.  Will get basic blood work renal ultrasound and x-ray.  Differential diagnosis does include UTI, nonspecific abdominal pain.  Other causes such as appendicitis felt less likely.  Blood work reassuring.  Urinalysis does not show infection.  Discussed with patient and since I do not have the results of previous urinalysis we will keep antibiotics going for now.  Potentially musculoskeletal pain.  States she has been on Flexeril  but told not to take it with the Celebrex .  Do not know of an interaction with this but I think should be fine since the Flexeril  is helping with the pain.  With recent CT scan do not think we need new CT imaging.  Will discharge home.  Reviewed flank and no rash indicative of zoster.        Final Clinical Impression(s) / ED Diagnoses Final diagnoses:  None    Rx / DC Orders ED Discharge Orders     None         Mozell Arias, MD 06/01/23 1459

## 2023-06-01 NOTE — Discharge Instructions (Signed)
 Your workup was reassuring today.  No clear cause of the pain.  Your urine did not show infection here but I do not know what the previous urinalysis showed.  Continue the antibiotics for now.  Dr. Rodolph Clap can follow the culture.  You can take your Flexeril  to help with the pain.  Also with your reported constipation some MiraLAX may help clear that out.

## 2023-06-01 NOTE — Assessment & Plan Note (Signed)
 Obtain US  of kidneys and report from x ray done at outside clinic

## 2023-06-02 ENCOUNTER — Ambulatory Visit: Payer: Self-pay | Admitting: Family Medicine

## 2023-06-04 LAB — URINE CULTURE

## 2023-06-05 ENCOUNTER — Ambulatory Visit (INDEPENDENT_AMBULATORY_CARE_PROVIDER_SITE_OTHER): Admitting: Psychiatry

## 2023-06-05 DIAGNOSIS — F39 Unspecified mood [affective] disorder: Secondary | ICD-10-CM | POA: Diagnosis not present

## 2023-06-05 NOTE — Progress Notes (Signed)
 IN-PERSON  THERAPIST PROGRESS NOTE  Session Time: Monday 06/05/2023 3:58 PM -  4:32 PM      Participation Level: Active  Behavioral Response: CasualAlertless anxious, less depressed,   Type of Therapy: Individual Therapy  Treatment Goals addressed:   Reduce episodes of irritability and lashing out to 1 x every 2 weeks for 30 days per pt's report      Pt will learn and implement relaxation techniques, will practice a relaxation technique daily    ProgressTowards Goals: progressing   Interventions: Supportive, CBT   Summary: Stephanie Cannon is a 53 y.o. female who is referred for services by PCP Dr. Rodolph Clap due to pt experiencing symptoms of anxiety. Pt sees psychiatrist Dr. Arfeen for medication management. Pt denies any psychiatric hospitalizations. Pt participated in teleheath counseling with son last year. Pt reports experiencing grief and loss issues related to the death  of a cousin who was like a sister in July 2024. She states she can't hardly talk about her without becoming very emotional. She also reports stress related to recently being injured on the job. She says she has been made to feel like "I am the blame" by her employer. She reports financial stress. She reports additional stress regarding conflict between son and husband resulting in them not talking to leach other since July 2024. She also worries about her mother as she suspects mother may have dementia. Pt's current symptoms include sadness, worry, tearfulness,  difficulty concentration, fatigue, irritability, sleep difficulty, and restlessness.          Patient last was seen about 2 weeks ago.  She reports continued stress regarding her work environment but continuing to managing better.  She is pleased about a recent meeting with HR and is hopeful her concerns regarding her work environment will be addressed.  She is scheduled to have surgery this week and anticipates being out of work for 6 weeks.  She reports continued  marital stress and cites recent example regarding conflict with husband.  She has continued to try to take a pause before responding and has been using deep breathing.  Per patient's report, this has been helpful.  She reports she has been more assertive and tried to avoid lashing out.  She also continues to experience grief and loss issues regarding the death of her friend.  She reports recently being triggered by reading some notes her friend had written for patient.  Therapist and  patient agreed to end session early today as patient is not feeling well as she her voice is hoarse.  Suicidal/Homicidal: Nowithout intent/plan  Therapist Response: Reviewed symptoms,  discussed stressors, facilitated expression of thoughts and feelings, validated feelings, praised and reinforced patient's efforts to practice relaxation techniques, discussed effects of use, praised and reinforced patient's efforts to take a pause before responding, discussed effects, denies being assertive versus being aggressive in communication, also discussed disengaging when needed, encouraged patient to continue practicing relaxation techniques   Plan: Return again in 2 weeks.  Diagnosis: Mood disorder  Collaboration of Care: Psychiatrist AEB patient sees psychiatrist Dr. Arfeen for medication management  Patient/Guardian was advised Release of Information must be obtained prior to any record release in order to collaborate their care with an outside provider. Patient/Guardian was advised if they have not already done so to contact the registration department to sign all necessary forms in order for us  to release information regarding their care.   Consent: Patient/Guardian gives verbal consent for treatment and assignment of benefits for  services provided during this visit. Patient/Guardian expressed understanding and agreed to proceed.   Dicie Foster, LCSW 06/05/2023

## 2023-06-07 ENCOUNTER — Telehealth: Payer: Self-pay | Admitting: Neurology

## 2023-06-07 NOTE — Telephone Encounter (Signed)
 Surgical Clearance form received from and sent back to [facility]. Signed by Dr. Gracie Lav. Faxed back on 06/06/23

## 2023-06-10 ENCOUNTER — Other Ambulatory Visit: Payer: Self-pay | Admitting: Family Medicine

## 2023-06-12 NOTE — Telephone Encounter (Signed)
 The Nexium  was refilled today but she is wanting to know if the dose can be increased or if she is on the max dose already? Pls advise

## 2023-06-13 MED ORDER — ESOMEPRAZOLE MAGNESIUM 40 MG PO CPDR: 40.0000 mg | DELAYED_RELEASE_CAPSULE | Freq: Two times a day (BID) | ORAL | 2 refills | Status: DC

## 2023-06-13 NOTE — Telephone Encounter (Signed)
 Dose is increased for 3 months, yopu will need to see gI if you continue to need this higher dose

## 2023-06-16 ENCOUNTER — Ambulatory Visit: Admitting: Family Medicine

## 2023-06-21 ENCOUNTER — Ambulatory Visit (INDEPENDENT_AMBULATORY_CARE_PROVIDER_SITE_OTHER): Admitting: Psychiatry

## 2023-06-21 DIAGNOSIS — F39 Unspecified mood [affective] disorder: Secondary | ICD-10-CM

## 2023-06-21 NOTE — Progress Notes (Signed)
 IN-PERSON  THERAPIST PROGRESS NOTE  Session Time: Wednesday 06/21/2023 4:10 PM - 4:45 PM                    Participation Level: Active  Behavioral Response: CasualAlertless anxious, less depressed,   Type of Therapy: Individual Therapy  Treatment Goals addressed:   Reduce episodes of irritability and lashing out to 1 x every 2 weeks for 30 days per pt's report      Pt will learn and implement relaxation techniques, will practice a relaxation technique daily    ProgressTowards Goals: progressing   Interventions: Supportive, CBT   Summary: Stephanie Cannon is a 53 y.o. female who is referred for services by PCP Dr. Rodolph Clap due to pt experiencing symptoms of anxiety. Pt sees psychiatrist Dr. Arfeen for medication management. Pt denies any psychiatric hospitalizations. Pt participated in teleheath counseling with son last year. Pt reports experiencing grief and loss issues related to the death  of a cousin who was like a sister in 2022-08-20. She states she can't hardly talk about her without becoming very emotional. She also reports stress related to recently being injured on the job. She says she has been made to feel like I am the blame by her employer. She reports financial stress. She reports additional stress regarding conflict between son and husband resulting in them not talking to leach other since 08/20/2022. She also worries about her mother as she suspects mother may have dementia. Pt's current symptoms include sadness, worry, tearfulness,  difficulty concentration, fatigue, irritability, sleep difficulty, and restlessness.          Patient last was seen about 2 weeks ago.  She reports having shoulder surgery since last session.  Per her report, surgery went well.  She plans to start physical therapy next Monday.  She reports feeling much less stressed as she is away from work on medical leave.  She anticipates returning to work in 3 weeks.  She states being okay with this.  She  expresses hurt as she did not receive a card from her office and states she has participated in activities such as singing cards when there have been other people in her office who were out on sick leave.  Patient also reports often feeling excluded in her office.  Patient reports increased thoughts about her deceased cousin as the first anniversary of her death is in Aug 20, 2023.  Patient reports increased tearfulness and sadness.   Suicidal/Homicidal: Nowithout intent/plan  Therapist Response: Reviewed symptoms,  discussed stressors, facilitated expression of thoughts and feelings, validated feelings, normalized feelings related to grief and loss, facilitated patient sharing more information regarding her relationship with her cousin, assisted patient identify ways she depended on cousin, began to assist patient identify ways to meet her emotional needs  Plan: Return again in 2 weeks.  Diagnosis: Mood disorder  Collaboration of Care: Psychiatrist AEB patient sees psychiatrist Dr. Arfeen for medication management  Patient/Guardian was advised Release of Information must be obtained prior to any record release in order to collaborate their care with an outside provider. Patient/Guardian was advised if they have not already done so to contact the registration department to sign all necessary forms in order for us  to release information regarding their care.   Consent: Patient/Guardian gives verbal consent for treatment and assignment of benefits for services provided during this visit. Patient/Guardian expressed understanding and agreed to proceed.   Dicie Foster, LCSW 06/21/2023

## 2023-06-23 ENCOUNTER — Telehealth: Payer: Self-pay

## 2023-06-23 NOTE — Telephone Encounter (Signed)
 Copied from CRM [id number]. Topic: General - Other >> Jun 23, 2023  3:34 PM Oddis Bench wrote: Reason for MVH:QIONGEX is calling to have Dr Rodolph Clap to call her back about an issue with her medical record. [phone]

## 2023-06-25 ENCOUNTER — Encounter: Payer: Self-pay | Admitting: Family Medicine

## 2023-06-27 MED ORDER — TIRZEPATIDE-WEIGHT MANAGEMENT 2.5 MG/0.5ML ~~LOC~~ SOLN
2.5000 mg | SUBCUTANEOUS | 0 refills | Status: DC
Start: 2023-06-27 — End: 2023-08-08

## 2023-06-28 ENCOUNTER — Telehealth: Payer: Self-pay | Admitting: Pharmacy Technician

## 2023-06-28 ENCOUNTER — Other Ambulatory Visit (HOSPITAL_COMMUNITY): Payer: Self-pay

## 2023-06-28 NOTE — Telephone Encounter (Signed)
 Pharmacy Patient Advocate Encounter   Received notification from CoverMyMeds that prior authorization for Zepbound  2.5MG /0.5ML pen-injector is required/requested.   Insurance verification completed.   The patient is insured through [facility] .   Per test claim: PA required; PA submitted to above mentioned insurance via CoverMyMeds Key/confirmation #/EOC [id number] Status is pending

## 2023-06-29 ENCOUNTER — Other Ambulatory Visit (HOSPITAL_COMMUNITY): Payer: Self-pay

## 2023-06-29 ENCOUNTER — Telehealth: Payer: Self-pay

## 2023-06-29 NOTE — Telephone Encounter (Signed)
 PA request has been Started. New Encounter has been or will be created for follow up. For additional info see Pharmacy Prior Auth telephone encounter from 06/28/2023.

## 2023-06-29 NOTE — Telephone Encounter (Signed)
 Copied from CRM [id number]. Topic: Clinical - Prescription Issue >> Jun 28, 2023  3:14 PM Nathanel BROCKS wrote: Reason for CRM: Pt stated that she was given a rx for Zepbound  and the pharmacy called her and stated that they need a prior authorization before they can fill it.   Please advise.

## 2023-06-29 NOTE — Telephone Encounter (Signed)
 Pharmacy Patient Advocate Encounter  Received notification from [facility] that Prior Authorization for Zepbound  2.5MG /0.5ML pen-injectors has been DENIED.  Full denial letter will be uploaded to the media tab. See denial reason below.   PA #/Case ID/Reference #: Key: [id number]

## 2023-07-03 ENCOUNTER — Other Ambulatory Visit (HOSPITAL_COMMUNITY): Payer: Self-pay

## 2023-07-03 NOTE — Telephone Encounter (Signed)
 Reached out to [employer] and they advised that if patient's primary insurance which is [employer] denied the medication that they would also deny the medication. Any further questions please advise patient to reach out to her insurance plans directly.

## 2023-07-04 ENCOUNTER — Other Ambulatory Visit: Payer: Self-pay | Admitting: Family Medicine

## 2023-07-05 ENCOUNTER — Ambulatory Visit (HOSPITAL_COMMUNITY): Admitting: Psychiatry

## 2023-07-11 ENCOUNTER — Encounter: Payer: Self-pay | Admitting: Family Medicine

## 2023-07-14 ENCOUNTER — Other Ambulatory Visit: Payer: Self-pay

## 2023-07-14 DIAGNOSIS — M4316 Spondylolisthesis, lumbar region: Secondary | ICD-10-CM

## 2023-07-14 MED ORDER — UNABLE TO FIND: 0 refills | Status: DC

## 2023-07-17 ENCOUNTER — Other Ambulatory Visit (HOSPITAL_COMMUNITY): Payer: Self-pay

## 2023-07-27 ENCOUNTER — Encounter: Payer: Self-pay | Admitting: Family Medicine

## 2023-07-27 DIAGNOSIS — K219 Gastro-esophageal reflux disease without esophagitis: Secondary | ICD-10-CM

## 2023-08-02 ENCOUNTER — Encounter: Payer: Self-pay | Admitting: Gastroenterology

## 2023-08-03 ENCOUNTER — Telehealth: Payer: Self-pay

## 2023-08-03 ENCOUNTER — Other Ambulatory Visit: Payer: Self-pay

## 2023-08-03 ENCOUNTER — Ambulatory Visit (HOSPITAL_COMMUNITY): Admitting: Psychiatry

## 2023-08-03 MED ORDER — TRIAMTERENE-HCTZ 75-50 MG PO TABS: 1.0000 | ORAL_TABLET | Freq: Every day | ORAL | 0 refills | Status: DC

## 2023-08-03 NOTE — Telephone Encounter (Signed)
 Copied from CRM [id number]. Topic: Clinical - Medication Question >> Aug 03, 2023  8:28 AM Turkey A wrote: Reason for CRM: Patient is currently in [place], [place]. She is left her blood pressure medication at home. Patient states she needs four pills. Triamterene -hydrochlorothiazide  (MAXZIDE ) 75-50 MG tablet. Patient would like for medicine to go to  [facility] [phone]

## 2023-08-04 ENCOUNTER — Ambulatory Visit: Payer: Self-pay

## 2023-08-04 ENCOUNTER — Encounter: Payer: Self-pay | Admitting: Family Medicine

## 2023-08-04 NOTE — Telephone Encounter (Signed)
 FYI Only or Action Required?: FYI only for provider.  Patient was last seen in primary care on 05/31/2023 by Antonetta Rollene BRAVO, MD.  Called Nurse Triage reporting Palpitations.  Symptoms began several days ago.  Interventions attempted: Rest, hydration, or home remedies.  Symptoms are: unchanged.  Triage Disposition: Go to ED Now (Notify PCP)  Patient/caregiver understands and will follow disposition?: Unsure     Copied from CRM [id number]. Topic: Clinical - Red Word Triage >> Aug 04, 2023  4:44 PM Leonette P wrote: Red Word that prompted transfer to Nurse Triage: heart palpitations.     Reason for Disposition  [1] Heart beating very rapidly (e.g., > 140 / minute) AND [2] present now  (Exception: During exercise.)  Answer Assessment - Initial Assessment Questions 1. DESCRIPTION: Please describe your heart rate or heartbeat that you are having (e.g., fast/slow, regular/irregular, skipped or extra beats, palpitations)     Palpitations  2. ONSET: When did it start? (e.g., minutes, hours, days)      2-3 days ago  3. DURATION: How long does it last (e.g., seconds, minutes, hours)     4-5 minutes  4. PATTERN Does it come and go, or has it been constant since it started?  Does it get worse with exertion?   Are you feeling it now?     Intermittent  6. HEART RATE: Can you tell me your heart rate? How many beats in 15 seconds?  Note: Not all patients can do this.       Unable to check  7. RECURRENT SYMPTOM: Have you ever had this before? If Yes, ask: When was the last time? and What happened that time?      Yes, but was not seen at that time  8. CAUSE: What do you think is causing the palpitations?     Unsure  9. CARDIAC HISTORY: Do you have any history of heart disease? (e.g., heart attack, angina, bypass surgery, angioplasty, arrhythmia)      No 10. OTHER SYMPTOMS: Do you have any other symptoms? (e.g., dizziness, chest pain, sweating, difficulty  breathing)       No  Protocols used: Heart Rate and Heartbeat Questions-A-AH

## 2023-08-07 ENCOUNTER — Other Ambulatory Visit: Payer: Self-pay | Admitting: Family Medicine

## 2023-08-07 MED ORDER — ALPRAZOLAM 1 MG PO TABS: 1.0000 mg | ORAL_TABLET | Freq: Two times a day (BID) | ORAL | 0 refills | Status: AC | PRN

## 2023-08-08 ENCOUNTER — Encounter (HOSPITAL_COMMUNITY): Payer: Self-pay

## 2023-08-08 ENCOUNTER — Encounter: Payer: Self-pay | Admitting: Gastroenterology

## 2023-08-08 ENCOUNTER — Ambulatory Visit (INDEPENDENT_AMBULATORY_CARE_PROVIDER_SITE_OTHER): Admitting: Gastroenterology

## 2023-08-08 VITALS — BP 136/84 | HR 84 | Temp 97.7°F | Ht 61.0 in | Wt 231.0 lb

## 2023-08-08 DIAGNOSIS — K5900 Constipation, unspecified: Secondary | ICD-10-CM | POA: Diagnosis not present

## 2023-08-08 DIAGNOSIS — K219 Gastro-esophageal reflux disease without esophagitis: Secondary | ICD-10-CM | POA: Diagnosis not present

## 2023-08-08 NOTE — Progress Notes (Signed)
 GI Office Note    Referring Provider: Antonetta Rollene BRAVO, MD Primary Care Physician:  Antonetta Rollene BRAVO, MD Primary Gastroenterologist: Carlin POUR. Cindie, DO  Date:  08/08/2023  ID:  Stephanie Cannon, DOB 12/23/1970, MRN [id number]   Chief Complaint   Chief Complaint  Patient presents with   Dysphagia    Feels like something is coming up when she lies down.    History of Present Illness  Stephanie Cannon is a 53 y.o. female with a history of GERD, HTN, HLD, obesity, asthma, arthritis, and colon polyps presenting today at the request of Antonetta Rollene BRAVO, MD for evaluation of reflux.  Last office visit August 2014.  Presented with complaint of diarrhea that have been going on for greater than a year.  Symptoms began after cholecystectomy and was having fecal urgency and incontinence occasionally as well as postprandial diarrhea.  Symptoms improved with Lomotil  some but not completely.  Denies any weight loss.  Did report some nausea as well as diaphoresis.  Having loose stools and abdominal cramping preceding diarrhea.  Considered bile salt diarrhea but ordered colonoscopy and advise trial of Bentyl .  C. difficile stool culture and Giardia also ordered.  Colonoscopy in September 2014: - Normal TI - 2 rectal polyps that were benign and polypoid in nature - Small internal hemorrhoids  Colonoscopy December 2024 with Dr. Cindie: - Non- bleeding internal hemorrhoids.  - One 6 mm polyp in the ascending colon - One 4 mm polyp in the sigmoid colon  - The examination was otherwise normal. - Path: Tubular adenoma and hyperplastic polyp. - Repeat colonoscopy in 7 years for surveillance   Today:  Discussed the use of AI scribe software for clinical note transcription with the patient, who gave verbal consent to proceed.  She experiences regurgitation and reflux symptoms at least once a week, with one severe episode. The regurgitated material is phlegm-like, mostly clear, and liquid,  without significant burning or pain. These episodes are more likely to occur when she is lying down trying to go to sleep, but not necessarily at night, and are sometimes accompanied by burping and a 'nastiness' in her throat. No trouble swallowing or pain during regurgitation.  She has a history of gallbladder removal in 2014, after which she began experiencing looser stools and urgency. When she needs to go to the bathroom, it is urgent, and she has to 'pull over or get to a bathroom.' This urgency has persisted since the gallbladder surgery.  Her current medications include Xanax  1 mg twice a day as needed, amitriptyline , Buspar  three times a day, Cymbalta , and a combination blood pressure medication containing hydrochlorothiazide . She takes Nexium  once daily in the morning for reflux. She has previously used Miralax once for constipation after an x-ray showed blockage, but she does not feel constipated regularly.  She has experienced weight gain, noting a discrepancy in her weight measurements, and attributes some of this to being out of work following shoulder surgery. She had shoulder surgery to address issues related to a fall at work, which included cleaning around the rotator cuff, removing bone spurs, and releasing a calcium deposit. She is preparing to return to work soon.   Wt Readings from Last 5 Encounters:  08/08/23 213 lb 6.4 oz (96.8 kg)  06/01/23 219 lb 0.6 oz (99.4 kg)  05/31/23 219 lb 0.6 oz (99.4 kg)  04/21/23 215 lb 1.9 oz (97.6 kg)  03/21/23 217 lb (98.4 kg)    Current Outpatient Medications  Medication Sig Dispense Refill   ALPRAZolam  (XANAX ) 1 MG tablet Take 1 tablet (1 mg total) by mouth at bedtime. 30 tablet 5   ALPRAZolam  (XANAX ) 1 MG tablet Take 1 tablet (1 mg total) by mouth 2 (two) times daily as needed for up to 14 days for anxiety. 28 tablet 0   amitriptyline  (ELAVIL ) 100 MG tablet Take 1 tablet (100 mg total) by mouth at bedtime. 90 tablet 0   busPIRone   (BUSPAR ) 5 MG tablet Take 1 tablet (5 mg total) by mouth 3 (three) times daily. 90 tablet 1   celecoxib  (CELEBREX ) 100 MG capsule Take 100 mg by mouth 2 (two) times daily.     DULoxetine  (CYMBALTA ) 60 MG capsule Take 1 capsule (60 mg total) by mouth daily. 90 capsule 3   esomeprazole  (NEXIUM ) 40 MG capsule Take 1 capsule (40 mg total) by mouth 2 (two) times daily before a meal. 60 capsule 2   ezetimibe  (ZETIA ) 10 MG tablet Take 1 tablet (10 mg total) by mouth daily. 90 tablet 1   HYDROcodone -acetaminophen  (NORCO/VICODIN) 5-325 MG tablet Take 1-2 tablets by mouth every 6 (six) hours as needed.     lamoTRIgine  (LAMICTAL ) 200 MG tablet Take 1 tab daily 90 tablet 0   methocarbamol  (ROBAXIN ) 500 MG tablet Take 500 mg by mouth every 8 (eight) hours as needed.     MIRALAX 17 GM/SCOOP powder SMARTSIG:1 scoopful By Mouth Every Evening PRN     Multiple Vitamins-Minerals (ONE-A-DAY 50 PLUS PO) Take 2 tablets by mouth daily with breakfast.     potassium chloride  (KLOR-CON ) 10 MEQ tablet Take 10 mEq by mouth 2 (two) times daily.     traMADol  (ULTRAM ) 50 MG tablet Take 50-100 mg by mouth 2 (two) times daily as needed.     triamterene -hydrochlorothiazide  (MAXZIDE ) 75-50 MG tablet Take 1 tablet by mouth daily. 4 tablet 0   UNABLE TO FIND Med Name: Back Brace DX:M43.16 1 each 0   Cranberry 50 MG CHEW Chew 1 tablet by mouth in the morning.     cyclobenzaprine  (FLEXERIL ) 10 MG tablet Take 1 tablet (10 mg total) by mouth at bedtime. (Patient not taking: Reported on 08/08/2023) 90 tablet 1   No current facility-administered medications for this visit.    Past Medical History:  Diagnosis Date   Arthritis    Asthma    Phreesia 07/31/2019   Breast discharge    left spont white discharge x 6 months   Carpal tunnel syndrome    left, wears brace at night   Chronic back pain    GERD (gastroesophageal reflux disease)    Hyperlipidemia    Hypertension    Obesity     Past Surgical History:  Procedure Laterality  Date   ABDOMINAL HYSTERECTOMY  2003   partial   BREAST BIOPSY Left 2018   benign   CARPAL TUNNEL RELEASE Right    CESAREAN SECTION  1988   CESAREAN SECTION N/A    Phreesia 07/31/2019   CHOLECYSTECTOMY     COLONOSCOPY N/A 09/21/2012   Procedure: COLONOSCOPY;  Surgeon: Margo LITTIE Haddock, MD;  Location: [facility];  Service: Endoscopy;  Laterality: N/A;  9:30   COLONOSCOPY WITH PROPOFOL  N/A 12/16/2022   Procedure: COLONOSCOPY WITH PROPOFOL ;  Surgeon: Cindie Carlin POUR, DO;  Location: [facility];  Service: Endoscopy;  Laterality: N/A;  730am, asa 2   LAPAROSCOPIC GASTRIC SLEEVE RESECTION WITH HIATAL HERNIA REPAIR N/A 06/23/2014   Procedure: LAPAROSCOPIC LYSIS OF ADHESIONS, GASTRIC SLEEVE RESECTION AND  UPPER ENDO;  Surgeon: Alm Angle, MD;  Location: [facility];  Service: General;  Laterality: N/A;   LAPAROSCOPIC LYSIS OF ADHESIONS  06/23/2014   Procedure: LAPAROSCOPIC LYSIS OF ADHESIONS;  Surgeon: Alm Angle, MD;  Location: [facility];  Service: General;;   LUMBAR FUSION  01/22/2014   L4 L5       DR MAVIS   other     Vocal chord polyp removal   ovarian tumor  2005   ovaries removed    POLYPECTOMY  12/16/2022   Procedure: POLYPECTOMY;  Surgeon: Cindie Carlin POUR, DO;  Location: [facility];  Service: Endoscopy;;   SPINE SURGERY N/A    Phreesia 07/31/2019   TONSILLECTOMY     UPPER GI ENDOSCOPY  06/23/2014   Procedure: UPPER GI ENDOSCOPY;  Surgeon: Alm Angle, MD;  Location: [facility];  Service: General;;    Family History  Problem Relation Age of Onset   Hypertension Mother    Hypertension Father    Hypertension Brother    Cancer Maternal Aunt    Cancer Maternal Uncle        liver   Cancer Paternal Uncle        bone cancer   Diabetes Maternal Grandmother    Colon cancer Neg Hx     Allergies as of 08/08/2023 - Review Complete 08/08/2023  Allergen Reaction Noted   Codeine Itching 01/23/2008   Morphine Itching 01/23/2008    Social History   Socioeconomic History   Marital  status: Married    Spouse name: Not on file   Number of children: Not on file   Years of education: Not on file   Highest education level: 12th grade  Occupational History   Occupation: Company secretary: [employer]    Comment: [employer]   Tobacco Use   Smoking status: Former    Current packs/day: 0.00    Average packs/day: 0.8 packs/day for 17.0 years (12.8 ttl pk-yrs)    Types: Cigarettes    Start date: 01/04/1992    Quit date: 01/03/2009    Years since quitting: 14.6   Smokeless tobacco: Never  Vaping Use   Vaping status: Never Used  Substance and Sexual Activity   Alcohol use: Yes    Comment: occasionally   Drug use: No   Sexual activity: Yes  Other Topics Concern   Not on file  Social History Narrative   Not on file   Social Drivers of Health   Financial Resource Strain: High Risk (05/31/2023)   Overall Financial Resource Strain (CARDIA)    Difficulty of Paying Living Expenses: Very hard  Food Insecurity: No Food Insecurity (05/31/2023)   Hunger Vital Sign    Worried About Running Out of Food in the Last Year: Never true    Ran Out of Food in the Last Year: Never true  Transportation Needs: No Transportation Needs (05/31/2023)   PRAPARE - Administrator, Civil Service (Medical): No    Lack of Transportation (Non-Medical): No  Physical Activity: Unknown (05/31/2023)   Exercise Vital Sign    Days of Exercise per Week: 0 days    Minutes of Exercise per Session: Not on file  Stress: Stress Concern Present (05/31/2023)   [employer] of Occupational Health - Occupational Stress Questionnaire    Feeling of Stress : Rather much  Social Connections: Moderately Integrated (05/31/2023)   Social Connection and Isolation Panel    Frequency of Communication with Friends and Family: More than three  times a week    Frequency of Social Gatherings with Friends and Family: Once a week    Attends Religious Services: More than 4 times per year    Active  Member of [employer] or Organizations: No    Attends Engineer, structural: Not on file    Marital Status: Married     Review of Systems   Gen: Denies fever, chills, anorexia. Denies fatigue, weakness, weight loss.  CV: Denies chest pain, palpitations, syncope, peripheral edema, and claudication. Resp: Denies dyspnea at rest, cough, wheezing, coughing up blood, and pleurisy. GI: See HPI Derm: Denies rash, itching, dry skin Psych: Denies depression, anxiety, memory loss, confusion. No homicidal or suicidal ideation.  Heme: Denies bruising, bleeding, and enlarged lymph nodes.  Physical Exam   BP 136/84 (BP Location: Right Arm, Patient Position: Sitting, Cuff Size: Large)   Pulse 84   Temp 97.7 F (36.5 C) (Temporal)   Ht 5' 1 (1.549 m)   Wt 213 lb 6.4 oz (96.8 kg)   BMI 40.32 kg/m   General:   Alert and oriented. No distress noted. Pleasant and cooperative.  Head:  Normocephalic and atraumatic. Eyes:  Conjuctiva clear without scleral icterus. Mouth:  Oral mucosa pink and moist. Good dentition. No lesions. Abdomen:  +BS, soft, non-tender and non-distended. No rebound or guarding. No HSM or masses noted. Rectal: deferred Msk:  Symmetrical without gross deformities. Normal posture. Extremities:  Without edema. Neurologic:  Alert and  oriented x4 Psych:  Alert and cooperative. Normal mood and affect.  Assessment  Stephanie Cannon is a 53 y.o. female presenting today with reflux.  Gastroesophageal reflux disease with regurgitation Chronic gastroesophageal reflux disease with regurgitation, occurring at least once a week, with episodes of liquid regurgitation that are mostly clear, sometimes bile-colored. Symptoms are exacerbated by lying down, especially after eating. Nocturnal symptoms occur once a week. No dysphagia or significant pain reported. Possible silent reflux affecting vocal cords, causing hoarseness but also with history of vocal cord polyp. Current management with  once-daily Nexium  (esomeprazole ) is insufficient. - Increase Nexium  to 40 mg twice daily, 30 minutes prior to meals. - Provide dietary and lifestyle modification recommendations, including avoiding eating within 2-3 hours of bedtime, elevating the head of the bed, and reduction in dietary triggers. - Discussed Reflux Gourmet as a natural supplement option. - Consider upper endoscopy if symptoms do not improve with increased Nexium  dosage. - Provide information on potential TIF if medical management is ineffective after reaching BMI goal, <35 to qualify.  Intermittent constipation Intermittent constipation with occasional incomplete evacuation. No current symptoms of constipation, but side pain and back pain associated with constipation. No blood in stool or significant straining reported. Prefers trial and error approach to managing constipation with over-the-counter options. - Advise trial and error approach with over-the-counter options such as Miralax, Colace, or Senokot as needed.  PLAN   Follow up 2-3 months.    Charmaine Melia, MSN, FNP-BC, AGACNP-BC [facility]

## 2023-08-08 NOTE — Patient Instructions (Addendum)
 Consider trial of reflux gourmet to help with reflux symptoms.     If you are interested in potential TIF procedure please go to this website and if interested we could consider evaluation once your BMI is 35 or less.: [email or web address]  We will increase your nexium  to twice daily, taken 30 minutes prior to breakfast and dinner.  As we discussed if you do not have any improvement with the recommendations below as well as the twice daily medication we can consider upper endoscopy.  Follow a GERD diet and these lifestyle recommendations:  Avoid fried, fatty, greasy, spicy, citrus foods. This can include tomato based products for some.  Avoid/limit caffeine and carbonated beverages. Avoid chocolate. Try eating 4-6 small meals a day rather than 3 large meals. Do not eat within 3 hours of laying down. Prop head of bed up on wood or bricks to create a 6 inch incline.  For constipation continue trial of over-the-counter medication such as MiraLAX and/or stool softener.  Continue  to uses 1-2 times a week for more regularity and to avoid blowout cycles.   Follow up in 2-3 months.   It was a pleasure to see you today. I want to create trusting relationships with patients. If you receive a survey regarding your visit,  I greatly appreciate you taking time to fill this out on paper or through your MyChart. I value your feedback.  Charmaine Melia, MSN, FNP-BC, AGACNP-BC [facility]

## 2023-08-09 NOTE — Telephone Encounter (Signed)
 Patient has appointment tomorrow and I can discuss more in detail on her appointment.  I am not sure if she has FMLA and if not then I will consider to get FMLA.

## 2023-08-10 ENCOUNTER — Encounter (HOSPITAL_COMMUNITY): Payer: Self-pay | Admitting: Psychiatry

## 2023-08-10 ENCOUNTER — Telehealth (HOSPITAL_BASED_OUTPATIENT_CLINIC_OR_DEPARTMENT_OTHER): Admitting: Psychiatry

## 2023-08-10 ENCOUNTER — Other Ambulatory Visit (HOSPITAL_COMMUNITY): Payer: Self-pay

## 2023-08-10 ENCOUNTER — Telehealth: Payer: Self-pay | Admitting: Pharmacy Technician

## 2023-08-10 ENCOUNTER — Encounter: Payer: Self-pay | Admitting: Family Medicine

## 2023-08-10 VITALS — Wt 232.0 lb

## 2023-08-10 DIAGNOSIS — F411 Generalized anxiety disorder: Secondary | ICD-10-CM | POA: Diagnosis not present

## 2023-08-10 DIAGNOSIS — F331 Major depressive disorder, recurrent, moderate: Secondary | ICD-10-CM | POA: Diagnosis not present

## 2023-08-10 DIAGNOSIS — F4321 Adjustment disorder with depressed mood: Secondary | ICD-10-CM

## 2023-08-10 DIAGNOSIS — F5101 Primary insomnia: Secondary | ICD-10-CM

## 2023-08-10 MED ORDER — LAMOTRIGINE 200 MG PO TABS: ORAL_TABLET | ORAL | 0 refills | Status: DC

## 2023-08-10 MED ORDER — AMITRIPTYLINE HCL 100 MG PO TABS: 100.0000 mg | ORAL_TABLET | Freq: Every day | ORAL | 0 refills | Status: DC

## 2023-08-10 MED ORDER — BUSPIRONE HCL 10 MG PO TABS: 10.0000 mg | ORAL_TABLET | Freq: Two times a day (BID) | ORAL | 1 refills | Status: DC

## 2023-08-10 MED ORDER — TIRZEPATIDE-WEIGHT MANAGEMENT 2.5 MG/0.5ML ~~LOC~~ SOAJ
2.5000 mg | SUBCUTANEOUS | 0 refills | Status: DC
Start: 2023-08-10 — End: 2023-10-02

## 2023-08-10 NOTE — Telephone Encounter (Signed)
 Pharmacy Patient Advocate Encounter   Received notification from CoverMyMeds that prior authorization for Zepbound  2.5MG /0.5ML pen-injectors is required/requested.   Insurance verification completed.   The patient is insured through [facility] .   Per test claim: PA required; PA started via CoverMyMeds. KEY [id number] . Waiting for clinical questions to populate.

## 2023-08-10 NOTE — Progress Notes (Signed)
  Health MD Virtual Progress Note   Patient Location: In Car Provider Location: Office  I connect with patient by video and verified that I am speaking with correct person by using two identifiers. I discussed the limitations of evaluation and management by telemedicine and the availability of in person appointments. I also discussed with the patient that there may be a patient responsible charge related to this service. The patient expressed understanding and agreed to proceed.  Stephanie Cannon [id number] 53 y.o.  08/10/2023 9:43 AM  History of Present Illness:  Patient is evaluated by video session.  She reported a lot of stress at work and that is making her more sad irritable and anxious.  She started to have issues with a coworker and now she had decided to go to HR discussed with EOC.  She is working at [employer] as a Psychologist, forensic and she feel she is abandoned by coworkers.  She was on medical leave and just recently go back to work.  She does not want to change the medication.  She admitted missed the appointment with Winton Rubinstein but had a schedule again on 14th.  During the session she was tearful, crying.  She also reported other family concerns as husband does not understand very well and sometimes they having communication problem.  She is also concerned about her elderly parents.  Mother is recently hospitalized and her 62 year old father not doing very well.  She has good support from friends and she is involved in church activities.  Sometimes does not have motivation to do things.  She has FMLA which she uses when she has flareup but try not to use more frequently.  She also reporting back pain which is worsening lately.  She is concerned about her general health.  Recently her acid reflux is acting out and she has to take the medicine more frequently.  She is getting Cymbalta  from neurologist for her neuropathy and back pain.  She also getting Xanax  from PCP to help  with anxiety and BuSpar .  She is not sleeping well.  She denies any hallucination, paranoia, suicidal thoughts but admitted getting easily short temper and irritated.  She is concerned about weight gain.  She gained more than 15 pounds in past 3 months.  She denies drinking or using any illegal substances.  Past Psychiatric History: No h/o inpatient treatment or suicidal attempt.  Saw Dr. Okey twice but no further follow-ups.  Saw therapist but reported to help her son. Tried Prozac  temazepam , Ambien, melatonin and recently BuSpar . No history of psychosis.   Past Medical History:  Diagnosis Date   Arthritis    Asthma    Phreesia 07/31/2019   Breast discharge    left spont white discharge x 6 months   Carpal tunnel syndrome    left, wears brace at night   Chronic back pain    GERD (gastroesophageal reflux disease)    Hyperlipidemia    Hypertension    Obesity     Outpatient Encounter Medications as of 08/10/2023  Medication Sig   ALPRAZolam  (XANAX ) 1 MG tablet Take 1 tablet (1 mg total) by mouth at bedtime.   ALPRAZolam  (XANAX ) 1 MG tablet Take 1 tablet (1 mg total) by mouth 2 (two) times daily as needed for up to 14 days for anxiety.   amitriptyline  (ELAVIL ) 100 MG tablet Take 1 tablet (100 mg total) by mouth at bedtime.   busPIRone  (BUSPAR ) 5 MG tablet Take 1 tablet (5 mg total)  by mouth 3 (three) times daily.   celecoxib  (CELEBREX ) 100 MG capsule Take 100 mg by mouth 2 (two) times daily.   Cranberry 50 MG CHEW Chew 1 tablet by mouth in the morning.   cyclobenzaprine  (FLEXERIL ) 10 MG tablet Take 1 tablet (10 mg total) by mouth at bedtime. (Patient not taking: Reported on 08/08/2023)   DULoxetine  (CYMBALTA ) 60 MG capsule Take 1 capsule (60 mg total) by mouth daily.   esomeprazole  (NEXIUM ) 40 MG capsule Take 1 capsule (40 mg total) by mouth 2 (two) times daily before a meal.   ezetimibe  (ZETIA ) 10 MG tablet Take 1 tablet (10 mg total) by mouth daily.   HYDROcodone -acetaminophen   (NORCO/VICODIN) 5-325 MG tablet Take 1-2 tablets by mouth every 6 (six) hours as needed.   lamoTRIgine  (LAMICTAL ) 200 MG tablet Take 1 tab daily   methocarbamol  (ROBAXIN ) 500 MG tablet Take 500 mg by mouth every 8 (eight) hours as needed.   MIRALAX 17 GM/SCOOP powder SMARTSIG:1 scoopful By Mouth Every Evening PRN   Multiple Vitamins-Minerals (ONE-A-DAY 50 PLUS PO) Take 2 tablets by mouth daily with breakfast.   potassium chloride  (KLOR-CON ) 10 MEQ tablet Take 10 mEq by mouth 2 (two) times daily.   traMADol  (ULTRAM ) 50 MG tablet Take 50-100 mg by mouth 2 (two) times daily as needed.   triamterene -hydrochlorothiazide  (MAXZIDE ) 75-50 MG tablet Take 1 tablet by mouth daily.   UNABLE TO FIND Med Name: Back Brace DX:M43.16   No facility-administered encounter medications on file as of 08/10/2023.    Recent Results (from the past 2160 hours)  Urine Culture     Status: Abnormal   Collection Time: 05/31/23  2:55 PM   Specimen: Urine   UR  Result Value Ref Range   Urine Culture, Routine Final report (A)    Organism ID, Bacteria Klebsiella pneumoniae (A)     Comment: Cefazolin  with an MIC <=16 predicts susceptibility to the oral agents cefaclor, cefdinir , cefpodoxime, cefprozil, cefuroxime, cephalexin , and loracarbef when used for therapy of uncomplicated urinary tract infections due to E. coli, Klebsiella pneumoniae, and Proteus mirabilis. Greater than 100,000 colony forming units per mL    Antimicrobial Susceptibility Comment     Comment:       ** S = Susceptible; I = Intermediate; R = Resistant **                    P = Positive; N = Negative             MICS are expressed in micrograms per mL    Antibiotic                 RSLT#1    RSLT#2    RSLT#3    RSLT#4 Amoxicillin /Clavulanic Acid    S Ampicillin                     R Cefazolin                       S Cefepime                       S Cefoxitin                      S Cefpodoxime                    S Ceftriaxone   S Ciprofloxacin                   S Ertapenem                      S Gentamicin                     S Levofloxacin                    S Meropenem                      S Nitrofurantoin                 I Piperacillin/Tazobactam        S Tetracycline                   S Tobramycin                     S Trimethoprim /Sulfa              S   CBC with Differential     Status: Abnormal   Collection Time: 06/01/23 12:08 PM  Result Value Ref Range   WBC 6.9 4.0 - 10.5 K/uL   RBC 4.87 3.87 - 5.11 MIL/uL   Hemoglobin 15.2 (H) 12.0 - 15.0 g/dL   HCT 54.0 63.9 - 53.9 %   MCV 94.3 80.0 - 100.0 fL   MCH 31.2 26.0 - 34.0 pg   MCHC 33.1 30.0 - 36.0 g/dL   RDW 86.3 88.4 - 84.4 %   Platelets 260 150 - 400 K/uL   nRBC 0.0 0.0 - 0.2 %   Neutrophils Relative % 65 %   Neutro Abs 4.6 1.7 - 7.7 K/uL   Lymphocytes Relative 27 %   Lymphs Abs 1.8 0.7 - 4.0 K/uL   Monocytes Relative 6 %   Monocytes Absolute 0.4 0.1 - 1.0 K/uL   Eosinophils Relative 1 %   Eosinophils Absolute 0.1 0.0 - 0.5 K/uL   Basophils Relative 1 %   Basophils Absolute 0.0 0.0 - 0.1 K/uL   Immature Granulocytes 0 %   Abs Immature Granulocytes 0.02 0.00 - 0.07 K/uL    Comment: Performed at [facility], [address], [place], [place] [address]  Comprehensive metabolic panel     Status: Abnormal   Collection Time: 06/01/23 12:08 PM  Result Value Ref Range   Sodium 136 135 - 145 mmol/L   Potassium 3.8 3.5 - 5.1 mmol/L   Chloride 98 98 - 111 mmol/L   CO2 27 22 - 32 mmol/L   Glucose, Bld 83 70 - 99 mg/dL    Comment: Glucose reference range applies only to samples taken after fasting for at least 8 hours.   BUN 18 6 - 20 mg/dL   Creatinine, Ser 9.03 0.44 - 1.00 mg/dL   Calcium 9.7 8.9 - 89.6 mg/dL   Total Protein 8.5 (H) 6.5 - 8.1 g/dL   Albumin  4.2 3.5 - 5.0 g/dL   AST 24 15 - 41 U/L   ALT 22 0 - 44 U/L   Alkaline Phosphatase 96 38 - 126 U/L   Total Bilirubin 0.5 0.0 - 1.2 mg/dL   GFR, Estimated >39 >39 mL/min    Comment:  (NOTE) Calculated using the CKD-EPI Creatinine Equation (2021)    Anion gap 11 5 - 15    Comment: Performed at [facility], [address] [place], [place] [address]  Urinalysis, w/ Reflex to Culture (Infection Suspected) -Urine, Unspecified Source     Status: Abnormal   Collection Time: 06/01/23  1:46 PM  Result Value Ref Range   Specimen Source URINE, UNSPE    Color, Urine STRAW (A) YELLOW   APPearance CLEAR CLEAR   Specific Gravity, Urine 1.005 1.005 - 1.030   pH 7.0 5.0 - 8.0   Glucose, UA NEGATIVE NEGATIVE mg/dL   Hgb urine dipstick NEGATIVE NEGATIVE   Bilirubin Urine NEGATIVE NEGATIVE   Ketones, ur NEGATIVE NEGATIVE mg/dL   Protein, ur NEGATIVE NEGATIVE mg/dL   Nitrite NEGATIVE NEGATIVE   Leukocytes,Ua NEGATIVE NEGATIVE   RBC / HPF 0-5 0 - 5 RBC/hpf   WBC, UA 0-5 0 - 5 WBC/hpf    Comment:        Reflex urine culture not performed if WBC <=10, OR if Squamous epithelial cells >5. If Squamous epithelial cells >5 suggest recollection.    Bacteria, UA NONE SEEN NONE SEEN   Squamous Epithelial / HPF 0-5 0 - 5 /HPF    Comment: Performed at [facility], [address], [place], [place] [address]     Psychiatric Specialty Exam: Physical Exam  Review of Systems  Gastrointestinal:        Heart burn due to acid reflux  Musculoskeletal:  Positive for back pain.  Neurological:  Positive for numbness.    Weight 232 lb (105.2 kg).There is no height or weight on file to calculate BMI.  General Appearance: Casual and tearful  Eye Contact:  Fair  Speech:  Slow  Volume:  Decreased  Mood:  Anxious and Dysphoric  Affect:  Constricted  Thought Process:  Descriptions of Associations: Intact  Orientation:  Full (Time, Place, and Person)  Thought Content:  Rumination  Suicidal Thoughts:  No  Homicidal Thoughts:  No  Memory:  Immediate;   Good Recent;   Good Remote;   Good  Judgement:  Intact  Insight:  Present  Psychomotor Activity:  Decreased  Concentration:  Concentration:  Good and Attention Span: Good  Recall:  Good  Fund of Knowledge:  Good  Language:  Good  Akathisia:  No  Handed:  Right  AIMS (if indicated):     Assets:  Communication Skills Desire for Improvement Housing Social Support Talents/Skills Transportation  ADL's:  Intact  Cognition:  WNL  Sleep: Fair       05/31/2023    2:07 PM 04/21/2023    3:23 PM 12/12/2022    3:07 PM 11/15/2022   11:08 AM 11/02/2022    9:09 AM  Depression screen PHQ 2/9  Decreased Interest 3 1 3  0 1  Down, Depressed, Hopeless 3 2 1 1 1   PHQ - 2 Score 6 3 4 1 2   Altered sleeping 3 3 3 2 1   Tired, decreased energy 3 2 1 3 1   Change in appetite 3 1 1 3  0  Feeling bad or failure about yourself  1 1 1  0 1  Trouble concentrating 3 3 3 1 1   Moving slowly or fidgety/restless 0 3 3 2 1   Suicidal thoughts 0 1 0 0 0  PHQ-9 Score 19 17 16 12 7   Difficult doing work/chores Very difficult Extremely dIfficult   Very difficult    Assessment/Plan: MDD (major depressive disorder), recurrent episode, moderate (HCC) - Plan: busPIRone  (BUSPAR ) 10 MG tablet  GAD (generalized anxiety disorder) - Plan: amitriptyline  (ELAVIL ) 100 MG tablet, busPIRone  (BUSPAR ) 10 MG tablet, lamoTRIgine  (LAMICTAL )  200 MG tablet  Primary insomnia - Plan: amitriptyline  (ELAVIL ) 100 MG tablet, busPIRone  (BUSPAR ) 10 MG tablet  Grief - Plan: amitriptyline  (ELAVIL ) 100 MG tablet  Patient is 53 year old married, employed female with history of hypertension, GERD, chronic back pain with neuropathy, muscle spasm, major depressive disorder, anxiety, insomnia and going through grief.  I reviewed blood work results, collect information from other providers and current medication.  Despite taking medication she continued to have symptoms of anxiety, irritability.  Encouraged to keep appointment with Winton Rubinstein as she had missed last appointment.  She is taking Xanax  Cymbalta  and BuSpar  from other providers.  I recommend to optimize BuSpar .  Currently she is  taking 5 mg 3 times a day and sometimes she skips the dose because she forgets.  I recommend to increase to 10 mg and take twice a day to increase the compliance.  She will keep the appointment with Winton Rubinstein.  I offered FMLA but patient already had FMLA which she uses when she has flareup.  She is going to talk to human resources about issue related to coworker.  Discussed marital stress and recommend to use coping skills.  Will continue amitriptyline  100 mg at bedtime and Lamictal  200 mg daily.  She has no rash, itching, tremors.  Encourage walking and exercise as patient is concerned about weight gain.  Discussed watching her calorie intake.  Will follow-up in 2 months however patient needed then she can be seen sooner.   Follow Up Instructions:     I discussed the assessment and treatment plan with the patient. The patient was provided an opportunity to ask questions and all were answered. The patient agreed with the plan and demonstrated an understanding of the instructions.   The patient was advised to call back or seek an in-person evaluation if the symptoms worsen or if the condition fails to improve as anticipated.    Collaboration of Care: Other provider involved in patient's care AEB notes are available in epic to review  Patient/Guardian was advised Release of Information must be obtained prior to any record release in order to collaborate their care with an outside provider. Patient/Guardian was advised if they have not already done so to contact the registration department to sign all necessary forms in order for us  to release information regarding their care.   Consent: Patient/Guardian gives verbal consent for treatment and assignment of benefits for services provided during this visit. Patient/Guardian expressed understanding and agreed to proceed.     Total encounter time 26 minutes which includes face-to-face time, chart reviewed, care coordination, order entry and  documentation during this encounter.   Note: This document was prepared by [employer] voice dictation technology and any errors that results from this process are unintentional.    Leni ONEIDA Client, MD 08/10/2023

## 2023-08-11 ENCOUNTER — Other Ambulatory Visit (HOSPITAL_COMMUNITY): Payer: Self-pay

## 2023-08-11 NOTE — Telephone Encounter (Signed)
 Thank you :)

## 2023-08-11 NOTE — Telephone Encounter (Signed)
 Pharmacy Patient Advocate Encounter  Received notification from [facility] that Prior Authorization for Zepbound  2.5MG /0.5ML pen-injectors  has been CANCELLED due to   PA #/Case ID/Reference #:  [id number]     Contacted [employer] and was advised that all weight loss medications are excluded from coverage. Patient can reach out to the health plan with any questions.

## 2023-08-17 ENCOUNTER — Ambulatory Visit (HOSPITAL_COMMUNITY): Admitting: Psychiatry

## 2023-08-17 DIAGNOSIS — F39 Unspecified mood [affective] disorder: Secondary | ICD-10-CM

## 2023-08-17 NOTE — Progress Notes (Signed)
 IN-PERSON  THERAPIST PROGRESS NOTE  Session Time: Thursday  08/17/2023 4:00 PM  - 4:49 PM                  Participation Level: Active  Behavioral Response: CasualAlertless anxious, less depressed,   Type of Therapy: Individual Therapy  Treatment Goals addressed:   Reduce episodes of irritability and lashing out to 1 x every 2 weeks for 30 days per pt's report      Pt will learn and implement relaxation techniques, will practice a relaxation technique daily    ProgressTowards Goals: progressing   Interventions: Supportive, CBT   Summary: Stephanie Cannon is a 53 y.o. female who is referred for services by PCP Dr. Antonetta due to pt experiencing symptoms of anxiety. Pt sees psychiatrist Dr. Arfeen for medication management. Pt denies any psychiatric hospitalizations. Pt participated in teleheath counseling with son last year. Pt reports experiencing grief and loss issues related to the death  of a cousin who was like a sister in 08-14-22. She states she can't hardly talk about her without becoming very emotional. She also reports stress related to recently being injured on the job. She says she has been made to feel like I am the blame by her employer. She reports financial stress. She reports additional stress regarding conflict between son and husband resulting in them not talking to leach other since 08/14/2022. She also worries about her mother as she suspects mother may have dementia. Pt's current symptoms include sadness, worry, tearfulness,  difficulty concentration, fatigue, irritability, sleep difficulty, and restlessness.          Patient last was seen about 2 months ago.  She reports decreased episodes of irritability and lashing out.  She has continued to practice relaxation techniques like deep breathing.  She reports trying to be more aware of her thoughts and feelings.  She reports improved efforts and pausing before responding especially in interaction with her husband.  She  continues to express frustration regarding communication issues as husband tends to shut down rather than discussing their problems per patient's report.  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  Patient continues to state her work environment is hostile.  She expresses disappointment as her efforts is seeking help from HR and the director of her program were unproductive.  However, she is still planning to have a meeting with the EEOC in October.  Patient reports just trying to focus on her work, using coping statements and relaxation techniques.  She reports positive interaction with other people at work.  She continues to miss her cousin and reports being very emotional on the anniversary of her death in 2023-08-14.  However, patient reports feeling better as she has reconnected with a friend to fill the void left by her friend who died.  Patient reports continued positive relationship and strong support from her son. Suicidal/Homicidal: Nowithout intent/plan               Therapist Response: Reviewed symptoms,  discussed stressors, facilitated expression of thoughts and feelings, validated feelings, normalized feelings related to grief and loss, praised and reinforced patient's efforts to pause before responding, discussed effects, praised and reinforced patient's efforts and reconnecting with a friend, discussed effects, praised and reinforced patient's use of healthy coping strategies to manage stress at work, encouraged consistent use, began to explore next steps for treatment  Plan: Return again in 2 weeks.  Diagnosis: Mood disorder  Collaboration of Care: Psychiatrist AEB patient sees psychiatrist Dr. Arfeen for medication management  Patient/Guardian was advised Release of Information must be obtained prior to any record release in order to collaborate their care with an outside provider. Patient/Guardian was advised if they  have not already done so to contact the registration department to sign all necessary forms in order for us  to release information regarding their care.   Consent: Patient/Guardian gives verbal consent for treatment and assignment of benefits for services provided during this visit. Patient/Guardian expressed understanding and agreed to proceed.   Winton FORBES Rubinstein, LCSW 08/17/2023

## 2023-08-21 ENCOUNTER — Other Ambulatory Visit: Payer: Self-pay | Admitting: Gastroenterology

## 2023-08-21 ENCOUNTER — Telehealth: Payer: Self-pay

## 2023-08-21 MED ORDER — ESOMEPRAZOLE MAGNESIUM 40 MG PO CPDR: 40.0000 mg | DELAYED_RELEASE_CAPSULE | Freq: Two times a day (BID) | ORAL | 2 refills | Status: DC

## 2023-08-21 NOTE — Telephone Encounter (Signed)
 Pt phoned wanting a refill on her esomeprazole  sent to her pharmacy.

## 2023-08-22 ENCOUNTER — Other Ambulatory Visit (HOSPITAL_COMMUNITY): Payer: Self-pay

## 2023-08-22 ENCOUNTER — Telehealth: Payer: Self-pay | Admitting: Pharmacy Technician

## 2023-08-22 NOTE — Telephone Encounter (Signed)
 Pharmacy Patient Advocate Encounter   Received notification from Onbase that prior authorization for Alprazolam  1mg  tablet is required/requested.   Insurance verification completed.   The patient is insured through [facility] .   Per test claim: Refill too soon. PA is not needed at this time. Medication was filled 08/21/2023. Next eligible fill date is 09/14/2023.

## 2023-08-30 ENCOUNTER — Other Ambulatory Visit: Payer: Self-pay | Admitting: Family Medicine

## 2023-08-31 ENCOUNTER — Other Ambulatory Visit (HOSPITAL_COMMUNITY): Payer: Self-pay

## 2023-08-31 ENCOUNTER — Encounter: Payer: Self-pay | Admitting: Family Medicine

## 2023-09-05 ENCOUNTER — Ambulatory Visit (INDEPENDENT_AMBULATORY_CARE_PROVIDER_SITE_OTHER): Admitting: Psychiatry

## 2023-09-05 ENCOUNTER — Institutional Professional Consult (permissible substitution)

## 2023-09-05 DIAGNOSIS — F39 Unspecified mood [affective] disorder: Secondary | ICD-10-CM | POA: Diagnosis not present

## 2023-09-05 NOTE — Progress Notes (Signed)
 IN-PERSON  THERAPIST PROGRESS NOTE  Session Time: Tuesday 9/22/025 4:05 PM -  4:35 PM               Participation Level: Active  Behavioral Response: CasualAlertless anxious, less depressed,   Type of Therapy: Individual Therapy  Treatment Goals addressed:   Reduce episodes of irritability and lashing out to 1 x every 2 weeks for 30 days per pt's report      Pt will learn and implement relaxation techniques, will practice a relaxation technique daily    ProgressTowards Goals: progressing   Interventions: Supportive, CBT   Summary: Stephanie Cannon is a 53 y.o. female who is referred for services by PCP Dr. Antonetta due to pt experiencing symptoms of anxiety. Pt sees psychiatrist Dr. Arfeen for medication management. Pt denies any psychiatric hospitalizations. Pt participated in teleheath counseling with son last year. Pt reports experiencing grief and loss issues related to the death  of a cousin who was like a sister in July 2024. She states she can't hardly talk about her without becoming very emotional. She also reports stress related to recently being injured on the job. She says she has been made to feel like I am the blame by her employer. She reports financial stress. She reports additional stress regarding conflict between son and husband resulting in them not talking to leach other since July 2024. She also worries about her mother as she suspects mother may have dementia. Pt's current symptoms include sadness, worry, tearfulness,  difficulty concentration, fatigue, irritability, sleep difficulty, and restlessness.          Patient last was seen about 3-4 weeks  ago.  She reports decreased episodes of irritability and lashing out.  However, patient reports increased stress and muscle tension she mainly attributes to work.  She expresses frustration regarding her work environment.  She reports seeing Dr. Arfeen recently and expresses some confusion about change medication regimen.   Patient agrees to contact Dr. Curry regarding these concerns.  She also reports stress regarding recent issues with husband who recently was suspended temporarily with pay from his job.  Patient also reports increased thoughts about deceased cousin this past weekend suicidal/Homicidal: Nowithout intent/plan               Therapist Response: Reviewed symptoms,  discussed stressors, facilitated expression of thoughts and feelings, validated feelings, normalized feelings related to grief and loss, reviewed psychoeducation on anxiety and the stress response, discussed rationale for and provided patient with instructions on how to practice progressive muscle relaxation, developed plan with patient to practice prior to bedtime, checked out interactive audio activity to patient and provided with Access code to assist patient in her efforts, also assisted patient identify ways to manage and reduce stress at work including taking her to 15-minute breaks at work in addition to her lunch.  Plan: Return again in 2 weeks.  Diagnosis: Mood disorder  Collaboration of Care: Psychiatrist AEB patient sees psychiatrist Dr. Arfeen for medication management  Patient/Guardian was advised Release of Information must be obtained prior to any record release in order to collaborate their care with an outside provider. Patient/Guardian was advised if they have not already done so to contact the registration department to sign all necessary forms in order for us  to release information regarding their care.   Consent: Patient/Guardian gives verbal consent for treatment and assignment of benefits for services provided during this visit. Patient/Guardian expressed understanding and agreed to proceed.   Stephanie Cannon E Stephanie Fukushima,  LCSW 09/05/2023

## 2023-09-20 ENCOUNTER — Ambulatory Visit: Admitting: Family Medicine

## 2023-09-25 ENCOUNTER — Other Ambulatory Visit: Payer: Self-pay | Admitting: Family Medicine

## 2023-09-25 ENCOUNTER — Encounter: Payer: Self-pay | Admitting: Family Medicine

## 2023-09-25 DIAGNOSIS — Z1231 Encounter for screening mammogram for malignant neoplasm of breast: Secondary | ICD-10-CM

## 2023-09-26 ENCOUNTER — Ambulatory Visit (HOSPITAL_COMMUNITY): Admitting: Psychiatry

## 2023-09-26 DIAGNOSIS — F39 Unspecified mood [affective] disorder: Secondary | ICD-10-CM | POA: Diagnosis not present

## 2023-09-26 NOTE — Progress Notes (Signed)
 IN-PERSON  THERAPIST PROGRESS NOTE  Session Time: Tuesday 9/23/025 4:10 PM -  4:57 PM                  Participation Level: Active  Behavioral Response: CasualAlertless anxious, less depressed,   Type of Therapy: Individual Therapy  Treatment Goals addressed:   Reduce episodes of irritability and lashing out to 1 x every 2 weeks for 30 days per pt's report      Pt will learn and implement relaxation techniques, will practice a relaxation technique daily    ProgressTowards Goals: progressing   Interventions: Supportive, CBT   Summary: Stephanie Cannon is a 53 y.o. female who is referred for services by PCP Dr. Antonetta due to pt experiencing symptoms of anxiety. Pt sees psychiatrist Dr. Arfeen for medication management. Pt denies any psychiatric hospitalizations. Pt participated in teleheath counseling with son last year. Pt reports experiencing grief and loss issues related to the death  of a cousin who was like a sister in July 2024. She states she can't hardly talk about her without becoming very emotional. She also reports stress related to recently being injured on the job. She says she has been made to feel like I am the blame by her employer. She reports financial stress. She reports additional stress regarding conflict between son and husband resulting in them not talking to leach other since July 2024. She also worries about her mother as she suspects mother may have dementia. Pt's current symptoms include sadness, worry, tearfulness,  difficulty concentration, fatigue, irritability, sleep difficulty, and restlessness.          Patient last was seen about 3-4 weeks  ago.  She reports increased stress, frustration, irritability, sleep difficulty and fatigue since last session.  She reports falling asleep but having difficulty remaining asleep.  She reports waking up during the night with ruminating thoughts.  She states sleeping about 4 hours per night.  Patient reports her husband  recently lost his job.  She reports worry about financial issues as well as continued marital issues related to communication.  Per patient's report, there has been increased tension in the marriage and husband often will shut down rather than discussing their issues.  She reports continued stress regarding work as well as additional concerns regarding her parents health.  Patient also expresses frustration as she reports eating excessively at night and gaining weight.  She reports she did not practice progressive muscle relaxation exercise.  Suicidal/Homicidal: Nowithout intent/plan               Therapist Response: Reviewed symptoms,  discussed stressors, facilitated expression of thoughts and feelings, validated feelings, reviewed psychoeducation on anxiety and the stress response, courage patient to continue practicing deep breathing, discussed rationale for and assisted patient practice mindfulness activity using leaves on a stream to cope with ruminating thoughts, developed plan with patient to practice nightly, checked out interactive audio activity to patient and provided with access code.  Plan: Return again in 2 weeks.  Diagnosis: Mood disorder  Collaboration of Care: Psychiatrist AEB patient sees psychiatrist Dr. Arfeen for medication management  Patient/Guardian was advised Release of Information must be obtained prior to any record release in order to collaborate their care with an outside provider. Patient/Guardian was advised if they have not already done so to contact the registration department to sign all necessary forms in order for us  to release information regarding their care.   Consent: Patient/Guardian gives verbal consent for treatment and assignment  of benefits for services provided during this visit. Patient/Guardian expressed understanding and agreed to proceed.   Winton FORBES Rubinstein, LCSW 09/26/2023

## 2023-09-27 NOTE — Telephone Encounter (Signed)
 Scheduled

## 2023-10-02 ENCOUNTER — Telehealth: Admitting: Family Medicine

## 2023-10-02 DIAGNOSIS — E66813 Obesity, class 3: Secondary | ICD-10-CM

## 2023-10-02 DIAGNOSIS — Z6841 Body Mass Index (BMI) 40.0 and over, adult: Secondary | ICD-10-CM | POA: Diagnosis not present

## 2023-10-02 MED ORDER — SEMAGLUTIDE-WEIGHT MANAGEMENT 0.25 MG/0.5ML ~~LOC~~ SOAJ: 0.2500 mg | SUBCUTANEOUS | 0 refills | Status: AC

## 2023-10-02 NOTE — Progress Notes (Signed)
 Virtual Visit via Video Note  I connected with Stephanie Cannon on 10/02/23 at  1:20 PM EDT by a video enabled telemedicine application and verified that I am speaking with the correct person using two identifiers.  Patient Location: Home Provider Location: Home Office  I discussed the limitations, risks, security, and privacy concerns of performing an evaluation and management service by video and the availability of in person appointments. I also discussed with the patient that there may be a patient responsible charge related to this service. The patient expressed understanding and agreed to proceed.  Subjective: PCP: Antonetta Rollene BRAVO, MD  No chief complaint on file.  Obesity: Patient complains of obesity. Patient cites health, increased physical ability as reasons for wanting to lose weight.  Obesity History Weight in late teens: 170 lb. Period of greatest weight gain: 35 lb during mid adult years Lowest adult weight: 195 Highest adult weight: 232lb Amount of time at present weight: 232 lb.   History of Weight Loss Efforts Greatest amount of weight lost: 100 lb over 12 months Amount of time that loss was maintained: 7 years  Successful weight loss techniques attempted: surgical procedure: 2016 gastric sleeve Unsuccessful weight loss techniques attempted: very low calorie diet  Current Exercise Habits: walking 1-2 miles per day  Current Eating Habits Number of regular meals per day: 2 Number of snacking episodes per day: 0 Who shops for food? patient Who prepares food? patient Who eats with patient? patient Binge behavior?: yes  Purge behavior? no Anorexic behavior? no Eating precipitated by stress? yes  Guilt feelings associated with eating? yes   Other Potential Contributing Factors Use of alcohol: average 0 drinks/week Use of medications that may cause weight gain tricyclic antidepressants  History of past abuse? none Psych History: anxiety, depression, and  obesity Comorbidities: dyslipidemias and hypertension       ROS: Per HPI  Current Outpatient Medications:    semaglutide -weight management (WEGOVY ) 0.25 MG/0.5ML SOAJ SQ injection, Inject 0.25 mg into the skin once a week for 28 days., Disp: 2 mL, Rfl: 0   amitriptyline  (ELAVIL ) 100 MG tablet, Take 1 tablet (100 mg total) by mouth at bedtime., Disp: 90 tablet, Rfl: 0   busPIRone  (BUSPAR ) 10 MG tablet, Take 1 tablet (10 mg total) by mouth 2 (two) times daily., Disp: 60 tablet, Rfl: 1   celecoxib  (CELEBREX ) 100 MG capsule, Take 100 mg by mouth 2 (two) times daily., Disp: , Rfl:    Cranberry 50 MG CHEW, Chew 1 tablet by mouth in the morning., Disp: , Rfl:    cyclobenzaprine  (FLEXERIL ) 10 MG tablet, Take 1 tablet (10 mg total) by mouth at bedtime. (Patient not taking: Reported on 08/08/2023), Disp: 90 tablet, Rfl: 1   DULoxetine  (CYMBALTA ) 60 MG capsule, Take 1 capsule (60 mg total) by mouth daily., Disp: 90 capsule, Rfl: 3   esomeprazole  (NEXIUM ) 40 MG capsule, Take 1 capsule (40 mg total) by mouth 2 (two) times daily before a meal., Disp: 60 capsule, Rfl: 2   ezetimibe  (ZETIA ) 10 MG tablet, Take 1 tablet (10 mg total) by mouth daily., Disp: 90 tablet, Rfl: 1   HYDROcodone -acetaminophen  (NORCO/VICODIN) 5-325 MG tablet, Take 1-2 tablets by mouth every 6 (six) hours as needed., Disp: , Rfl:    lamoTRIgine  (LAMICTAL ) 200 MG tablet, Take 1 tab daily, Disp: 90 tablet, Rfl: 0   methocarbamol  (ROBAXIN ) 500 MG tablet, Take 500 mg by mouth every 8 (eight) hours as needed., Disp: , Rfl:    MIRALAX  17 GM/SCOOP powder, SMARTSIG:1 scoopful By Mouth Every Evening PRN, Disp: , Rfl:    Multiple Vitamins-Minerals (ONE-A-DAY 50 PLUS PO), Take 2 tablets by mouth daily with breakfast., Disp: , Rfl:    potassium chloride  (KLOR-CON ) 10 MEQ tablet, Take 10 mEq by mouth 2 (two) times daily., Disp: , Rfl:    traMADol  (ULTRAM ) 50 MG tablet, Take 50-100 mg by mouth 2 (two) times daily as needed., Disp: , Rfl:     triamterene -hydrochlorothiazide  (MAXZIDE ) 75-50 MG tablet, Take 1 tablet by mouth daily., Disp: 4 tablet, Rfl: 0  Observations/Objective: There were no vitals filed for this visit. Physical Exam Patient is alert and no acute distress noted.   Assessment and Plan: Class 3 severe obesity due to excess calories with serious comorbidity and body mass index (BMI) of 40.0 to 44.9 in adult Assessment & Plan: Trial on Wegogy 0.25 mg weekly injection  Discussed Eat a Balanced Diet: Focus on whole, nutrient-dense foods like lean proteins, vegetables, fruits, whole grains, and healthy fats while avoiding processed and sugary foods. Stay Active: Incorporate at least 30 minutes of moderate physical activity most days of the week, such as walking, jogging, or strength training. Hydrate and Rest: Drink plenty of water  throughout the day and ensure you get 7-9 hours of quality sleep each night to support metabolism and recovery. Practice Portion Control: Use smaller plates, measure portions, and eat mindfully to avoid overeating and manage calorie intake effectively.   Patient agreed to the plan of care   Other orders -     Semaglutide -Weight Management; Inject 0.25 mg into the skin once a week for 28 days.  Dispense: 2 mL; Refill: 0    Follow Up Instructions: No follow-ups on file.   I discussed the assessment and treatment plan with the patient. The patient was provided an opportunity to ask questions, and all were answered. The patient agreed with the plan and demonstrated an understanding of the instructions.   The patient was advised to call back or seek an in-person evaluation if the symptoms worsen or if the condition fails to improve as anticipated.  The above assessment and management plan was discussed with the patient. The patient verbalized understanding of and has agreed to the management plan.   Hilario Kidd Wilhelmena Falter, FNP

## 2023-10-02 NOTE — Assessment & Plan Note (Signed)
 Trial on Wegogy 0.25 mg weekly injection  Discussed Eat a Balanced Diet: Focus on whole, nutrient-dense foods like lean proteins, vegetables, fruits, whole grains, and healthy fats while avoiding processed and sugary foods. Stay Active: Incorporate at least 30 minutes of moderate physical activity most days of the week, such as walking, jogging, or strength training. Hydrate and Rest: Drink plenty of water  throughout the day and ensure you get 7-9 hours of quality sleep each night to support metabolism and recovery. Practice Portion Control: Use smaller plates, measure portions, and eat mindfully to avoid overeating and manage calorie intake effectively.   Patient agreed to the plan of care

## 2023-10-11 ENCOUNTER — Encounter: Payer: Self-pay | Admitting: Family Medicine

## 2023-10-12 ENCOUNTER — Telehealth: Admitting: Internal Medicine

## 2023-10-12 ENCOUNTER — Encounter: Payer: Self-pay | Admitting: Internal Medicine

## 2023-10-12 DIAGNOSIS — J309 Allergic rhinitis, unspecified: Secondary | ICD-10-CM

## 2023-10-12 DIAGNOSIS — U071 COVID-19: Secondary | ICD-10-CM

## 2023-10-12 MED ORDER — FLUTICASONE PROPIONATE 50 MCG/ACT NA SUSP: 2.0000 | Freq: Every day | NASAL | 1 refills | Status: DC

## 2023-10-12 NOTE — Patient Instructions (Signed)
 Please continue taking Paxlovid  as prescribed.  Please use Flonase  for nasal congestion/allergies.  Please use sinus inhaler and/or vaporizer for nasal congestion.  Please maintain at least 64 ounces of fluid intake in a day and eat at regular intervals.

## 2023-10-12 NOTE — Telephone Encounter (Signed)
 Scheduled video having headaches/congestion / did have body aches past Monday.

## 2023-10-12 NOTE — Progress Notes (Signed)
 Virtual Visit via Video Note   Because of Stephanie Cannon's co-morbid illnesses, she is at least at moderate risk for complications without adequate follow up.  This format is felt to be most appropriate for this patient at this time.  All issues noted in this document were discussed and addressed.  A limited physical exam was performed with this format.      Evaluation Performed:  Follow-up visit  Date:  10/12/2023   ID:  Stephanie Cannon, DOB 12/01/1970, MRN [id number]  Patient Location: Home Provider Location: Office/Clinic  Participants: Patient Location of Patient: Home Location of Provider: Telehealth Consent was obtain for visit to be over via telehealth. I verified that I am speaking with the correct person using two identifiers.  PCP:  Antonetta Rollene BRAVO, MD   Chief Complaint: Nasal congestion and fatigue  History of Present Illness:    Stephanie Cannon is a 53 y.o. female who has a video visit for complaint of nasal congestion, chills and fatigue for the last 4 days.  She tested positive for COVID at her workplace.  She was given Paxlovid , and has been taking it for the last 3 days.  She denies any fever in the last 2 days.  Denies any dyspnea or wheezing.  She continues to have nasal congestion, postnasal drip and dry cough.  The patient does have symptoms concerning for COVID-19 infection (fever, chills, cough, or new shortness of breath).   Past Medical, Surgical, Social History, Allergies, and Medications have been Reviewed.  Past Medical History:  Diagnosis Date   Arthritis    Asthma    [employer] 07/31/2019   Breast discharge    left spont white discharge x 6 months   Carpal tunnel syndrome    left, wears brace at night   Chronic back pain    GERD (gastroesophageal reflux disease)    Hyperlipidemia    Hypertension    Obesity    Past Surgical History:  Procedure Laterality Date   ABDOMINAL HYSTERECTOMY  2003   partial   BREAST BIOPSY Left 2018    benign   CARPAL TUNNEL RELEASE Right    CESAREAN SECTION  1988   CESAREAN SECTION N/A    [employer] 07/31/2019   CHOLECYSTECTOMY     COLONOSCOPY N/A 09/21/2012   Procedure: COLONOSCOPY;  Surgeon: Margo LITTIE Haddock, MD;  Location: [facility];  Service: Endoscopy;  Laterality: N/A;  9:30   COLONOSCOPY WITH PROPOFOL  N/A 12/16/2022   Procedure: COLONOSCOPY WITH PROPOFOL ;  Surgeon: Cindie Carlin POUR, DO;  Location: [facility];  Service: Endoscopy;  Laterality: N/A;  730am, asa 2   LAPAROSCOPIC GASTRIC SLEEVE RESECTION WITH HIATAL HERNIA REPAIR N/A 06/23/2014   Procedure: LAPAROSCOPIC LYSIS OF ADHESIONS, GASTRIC SLEEVE RESECTION AND UPPER ENDO;  Surgeon: Alm Angle, MD;  Location: [facility];  Service: General;  Laterality: N/A;   LAPAROSCOPIC LYSIS OF ADHESIONS  06/23/2014   Procedure: LAPAROSCOPIC LYSIS OF ADHESIONS;  Surgeon: Alm Angle, MD;  Location: [facility];  Service: General;;   LUMBAR FUSION  01/22/2014   L4 L5       DR MAVIS   other     Vocal chord polyp removal   ovarian tumor  2005   ovaries removed    POLYPECTOMY  12/16/2022   Procedure: POLYPECTOMY;  Surgeon: Cindie Carlin POUR, DO;  Location: [facility];  Service: Endoscopy;;   SPINE SURGERY N/A    [employer]ia 07/31/2019   TONSILLECTOMY  UPPER GI ENDOSCOPY  06/23/2014   Procedure: UPPER GI ENDOSCOPY;  Surgeon: Alm Angle, MD;  Location: [facility];  Service: General;;     Current Meds  Medication Sig   amitriptyline  (ELAVIL ) 100 MG tablet Take 1 tablet (100 mg total) by mouth at bedtime.   busPIRone  (BUSPAR ) 10 MG tablet Take 1 tablet (10 mg total) by mouth 2 (two) times daily.   celecoxib  (CELEBREX ) 100 MG capsule Take 100 mg by mouth 2 (two) times daily.   Cranberry 50 MG CHEW Chew 1 tablet by mouth in the morning.   cyclobenzaprine  (FLEXERIL ) 10 MG tablet Take 1 tablet (10 mg total) by mouth at bedtime.   DULoxetine  (CYMBALTA ) 60 MG capsule Take 1 capsule (60 mg total) by mouth daily.   esomeprazole  (NEXIUM ) 40 MG  capsule Take 1 capsule (40 mg total) by mouth 2 (two) times daily before a meal.   ezetimibe  (ZETIA ) 10 MG tablet Take 1 tablet (10 mg total) by mouth daily.   fluticasone  (FLONASE ) 50 MCG/ACT nasal spray Place 2 sprays into both nostrils daily.   HYDROcodone -acetaminophen  (NORCO/VICODIN) 5-325 MG tablet Take 1-2 tablets by mouth every 6 (six) hours as needed.   lamoTRIgine  (LAMICTAL ) 200 MG tablet Take 1 tab daily   methocarbamol  (ROBAXIN ) 500 MG tablet Take 500 mg by mouth every 8 (eight) hours as needed.   MIRALAX 17 GM/SCOOP powder SMARTSIG:1 scoopful By Mouth Every Evening PRN   Multiple Vitamins-Minerals (ONE-A-DAY 50 PLUS PO) Take 2 tablets by mouth daily with breakfast.   potassium chloride  (KLOR-CON ) 10 MEQ tablet Take 10 mEq by mouth 2 (two) times daily.   semaglutide -weight management (WEGOVY ) 0.25 MG/0.5ML SOAJ SQ injection Inject 0.25 mg into the skin once a week for 28 days.   traMADol  (ULTRAM ) 50 MG tablet Take 50-100 mg by mouth 2 (two) times daily as needed.   triamterene -hydrochlorothiazide  (MAXZIDE ) 75-50 MG tablet Take 1 tablet by mouth daily.     Allergies:   Codeine and Morphine   ROS:   Please see the history of present illness. All other systems reviewed and are negative.   Labs/Other Tests and Data Reviewed:    Recent Labs: 06/01/2023: ALT 22; BUN 18; Creatinine, Ser 0.96; Hemoglobin 15.2; Platelets 260; Potassium 3.8; Sodium 136   Recent Lipid Panel Lab Results  Component Value Date/Time   CHOL 247 (H) 03/20/2023 03:23 PM   TRIG 58 03/20/2023 03:23 PM   HDL 85 03/20/2023 03:23 PM   CHOLHDL 2.9 03/20/2023 03:23 PM   CHOLHDL 2.7 12/08/2022 01:28 PM   LDLCALC 153 (H) 03/20/2023 03:23 PM   LDLCALC 132 (H) 07/10/2018 12:41 PM    Wt Readings from Last 3 Encounters:  08/08/23 231 lb (104.8 kg)  06/01/23 219 lb 0.6 oz (99.4 kg)  05/31/23 219 lb 0.6 oz (99.4 kg)     Objective:    Vital Signs:  There were no vitals taken for this visit.   VITAL SIGNS:   reviewed GEN:  no acute distress EYES:  sclerae anicteric, EOMI - Extraocular Movements Intact RESPIRATORY:  normal respiratory effort, symmetric expansion NEURO:  alert and oriented x 3, no obvious focal deficit PSYCH:  normal affect  ASSESSMENT & PLAN:    COVID-19 Continue Paxlovid  as prescribed Self quarantine for 5 days from symptom onset or at least 24 hours afebrile period, whichever is later Flonase  for nasal congestion/allergic rhinitis Tylenol  as needed for fever or muscle aches Advised to maintain adequate hydration and eat at regular intervals Work note provided  I discussed the assessment and treatment plan with the patient. The patient was provided an opportunity to ask questions, and all were answered. The patient agreed with the plan and demonstrated an understanding of the instructions.   The patient was advised to call back or seek an in-person evaluation if the symptoms worsen or if the condition fails to improve as anticipated.  The above assessment and management plan was discussed with the patient. The patient verbalized understanding of and has agreed to the management plan.   Medication Adjustments/Labs and Tests Ordered: Current medicines are reviewed at length with the patient today.  Concerns regarding medicines are outlined above.   Tests Ordered: No orders of the defined types were placed in this encounter.   Medication Changes: Meds ordered this encounter  Medications   fluticasone  (FLONASE ) 50 MCG/ACT nasal spray    Sig: Place 2 sprays into both nostrils daily.    Dispense:  16 g    Refill:  1     Note: This dictation was prepared with Dragon dictation along with smaller phrase technology. Similar sounding words can be transcribed inadequately or may not be corrected upon review. Any transcriptional errors that result from this process are unintentional.      Disposition:  Follow up  Signed, Suzzane MARLA Blanch, MD  10/12/2023 11:54 AM      [facility]

## 2023-10-12 NOTE — Assessment & Plan Note (Signed)
 Continue Paxlovid  as prescribed Self quarantine for 5 days from symptom onset or at least 24 hours afebrile period, whichever is later Flonase  for nasal congestion/allergic rhinitis Tylenol  as needed for fever or muscle aches Advised to maintain adequate hydration and eat at regular intervals Work note provided

## 2023-10-14 ENCOUNTER — Emergency Department (HOSPITAL_COMMUNITY)
Admission: EM | Admit: 2023-10-14 | Discharge: 2023-10-14 | Disposition: A | Discharge status: Home / Self Care | Attending: Emergency Medicine | Admitting: Emergency Medicine

## 2023-10-14 ENCOUNTER — Emergency Department (HOSPITAL_COMMUNITY)

## 2023-10-14 DIAGNOSIS — M51369 Other intervertebral disc degeneration, lumbar region without mention of lumbar back pain or lower extremity pain: Secondary | ICD-10-CM | POA: Insufficient documentation

## 2023-10-14 DIAGNOSIS — M545 Low back pain, unspecified: Secondary | ICD-10-CM | POA: Diagnosis present

## 2023-10-14 MED ORDER — ACETAMINOPHEN 500 MG PO TABS: 500.0000 mg | ORAL_TABLET | Freq: Four times a day (QID) | ORAL | 0 refills | Status: DC | PRN

## 2023-10-14 MED ORDER — LIDOCAINE 4 % EX PTCH: 1.0000 | MEDICATED_PATCH | Freq: Two times a day (BID) | CUTANEOUS | 0 refills | Status: DC

## 2023-10-14 MED ORDER — ACETAMINOPHEN 500 MG PO TABS
1000.0000 mg | ORAL_TABLET | Freq: Once | ORAL | Status: AC
  Administered 2023-10-14: 1000 mg via ORAL
  Filled 2023-10-14: qty 2

## 2023-10-14 MED ORDER — NAPROXEN 250 MG PO TABS
500.0000 mg | ORAL_TABLET | Freq: Once | ORAL | Status: DC
Start: 2023-10-14 — End: 2023-10-14
  Filled 2023-10-14: qty 2

## 2023-10-14 MED ORDER — LIDOCAINE 5 % EX PTCH
1.0000 | MEDICATED_PATCH | CUTANEOUS | Status: DC
  Administered 2023-10-14: 1 via TRANSDERMAL
  Filled 2023-10-14: qty 1

## 2023-10-14 MED ORDER — OXYCODONE-ACETAMINOPHEN 5-325 MG PO TABS: 1.0000 | ORAL_TABLET | Freq: Two times a day (BID) | ORAL | 0 refills | Status: DC

## 2023-10-14 MED ORDER — DEXAMETHASONE SOD PHOSPHATE PF 10 MG/ML IJ SOLN
10.0000 mg | Freq: Once | INTRAMUSCULAR | Status: AC
  Administered 2023-10-14: 10 mg via INTRAMUSCULAR

## 2023-10-14 NOTE — ED Provider Notes (Signed)
 [place] EMERGENCY DEPARTMENT AT [facility] Provider Note   CSN: [id number] Arrival date & time: 10/14/23  1051     Patient presents with: Back Pain (Pain radiates to left hip)   Stephanie Cannon is a 53 y.o. female.   HPI    53 year old patient comes in with chief complaint of back pain.  Her pain has been present for about 3 to 4 days now.  Pain is located primarily in the lower lumbar spine and left lateral region.  The pain is fairly constant, worse with any activity.  Patient has previous history of lumbar spine surgery several years ago, but this pain is new and different.  Patient denies any radiation of this pain.  She does have some numbness/tingling in her left thigh, but she sees neurologist for it.  Patient denies any urinary incontinence, retention, new lower extremity weakness.  She takes Mobic  for pain already.  Patient's pain is still significant and she is unable to get proper rest.  Prior to Admission medications   Medication Sig Start Date End Date Taking? Authorizing Provider  amitriptyline  (ELAVIL ) 100 MG tablet Take 1 tablet (100 mg total) by mouth at bedtime. 08/10/23   Arfeen, Leni DASEN, MD  busPIRone  (BUSPAR ) 10 MG tablet Take 1 tablet (10 mg total) by mouth 2 (two) times daily. 08/10/23   Arfeen, Leni DASEN, MD  celecoxib  (CELEBREX ) 100 MG capsule Take 100 mg by mouth 2 (two) times daily. 06/20/23   [provider]  Cranberry 50 MG CHEW Chew 1 tablet by mouth in the morning.    [provider]  cyclobenzaprine  (FLEXERIL ) 10 MG tablet Take 1 tablet (10 mg total) by mouth at bedtime. 02/22/23   Antonetta Rollene BRAVO, MD  DULoxetine  (CYMBALTA ) 60 MG capsule Take 1 capsule (60 mg total) by mouth daily. 05/10/22   Onita Duos, MD  esomeprazole  (NEXIUM ) 40 MG capsule Take 1 capsule (40 mg total) by mouth 2 (two) times daily before a meal. 08/21/23   Kennedy Charmaine LITTIE, NP  ezetimibe  (ZETIA ) 10 MG tablet Take 1 tablet (10 mg total) by mouth daily. 07/04/23    Antonetta Rollene BRAVO, MD  fluticasone  (FLONASE ) 50 MCG/ACT nasal spray Place 2 sprays into both nostrils daily. 10/12/23   Tobie Suzzane POUR, MD  HYDROcodone -acetaminophen  (NORCO/VICODIN) 5-325 MG tablet Take 1-2 tablets by mouth every 6 (six) hours as needed. 06/08/23   [provider]  lamoTRIgine  (LAMICTAL ) 200 MG tablet Take 1 tab daily 08/10/23   Arfeen, Leni DASEN, MD  methocarbamol  (ROBAXIN ) 500 MG tablet Take 500 mg by mouth every 8 (eight) hours as needed. 06/08/23   [provider]  MIRALAX 17 GM/SCOOP powder SMARTSIG:1 scoopful By Mouth Every Evening PRN 05/31/23   [provider]  Multiple Vitamins-Minerals (ONE-A-DAY 50 PLUS PO) Take 2 tablets by mouth daily with breakfast.    [provider]  potassium chloride  (KLOR-CON ) 10 MEQ tablet Take 10 mEq by mouth 2 (two) times daily.    [provider]  semaglutide -weight management (WEGOVY ) 0.25 MG/0.5ML SOAJ SQ injection Inject 0.25 mg into the skin once a week for 28 days. 10/02/23 10/30/23  Del Orbe Polanco, Iliana, FNP  traMADol  (ULTRAM ) 50 MG tablet Take 50-100 mg by mouth 2 (two) times daily as needed. 04/20/23   [provider]  triamterene -hydrochlorothiazide  (MAXZIDE ) 75-50 MG tablet Take 1 tablet by mouth daily. 08/03/23   Antonetta Rollene BRAVO, MD    Allergies: Codeine and Morphine    Review of  Systems  All other systems reviewed and are negative.   Updated Vital Signs BP 139/84   Pulse 84   Temp 98.4 F (36.9 C) (Oral)   Resp 18   Ht 5' 1 (1.549 m)   Wt 108.9 kg   SpO2 99%   BMI 45.35 kg/m   Physical Exam Vitals and nursing note reviewed.  Constitutional:      Appearance: She is well-developed.  HENT:     Head: Atraumatic.  Cardiovascular:     Rate and Rhythm: Normal rate.  Pulmonary:     Effort: Pulmonary effort is normal.  Musculoskeletal:     Cervical back: Normal range of motion and neck supple.     Comments: Pt has tenderness over the lumbar region -low over and  paraspinal tenderness in the lumbar region. No step offs, no erythema. Able to discriminate between sharp and dull. Able to ambulate   Skin:    General: Skin is warm and dry.  Neurological:     Mental Status: She is alert and oriented to person, place, and time.     (all labs ordered are listed, but only abnormal results are displayed) Labs Reviewed - No data to display  EKG: None  Radiology: DG Lumbar Spine 2-3 Views Result Date: 10/14/2023 EXAM: 2 or 3 VIEW(S) XRAY OF THE LUMBAR SPINE 10/14/2023 11:23:00 AM COMPARISON: MRI of the lumbar spine from 05/11/2023. CLINICAL HISTORY: left hip pain. Per chart: Pt comes in for lower back pain and radiates to left hip. Pain has been there for a few days. Pt took a flexeril  and meloxicam  this morning with no relief. Pt walked slowly to room. A\T\Ox4. FINDINGS: LUMBAR SPINE: BONES: Alignment of the lumbar spine is normal. The patient is status post pedicle screw and posterior rod fixation at L4-L5 with interbody spacer in the L4-L5 disc space. There appears to be solid fusion of the L4-L5 disc space. No signs of acute fracture or subluxation. No aggressive appearing osseous lesion. DISCS AND DEGENERATIVE CHANGES: Moderate-to-severe L5-S1 degenerative disc disease is unchanged. Mild multilevel disc space narrowing with endplate spurring at the remaining lumbar spine levels. SOFT TISSUES: Upper abdominal surgical clips are noted within both upper quadrants. There are also surgical clips identified within the pelvis. IMPRESSION: 1. No acute abnormality of the lumbar spine. 2. Status post L4-5 pedicle screw and posterior rod fixation with interbody spacer and solid fusion. 3. Moderate-to-severe L5-S1 degenerative disc disease, unchanged. Electronically signed by: Waddell Calk MD 10/14/2023 11:35 AM EDT RP Workstation: [id number]   DG Hip Unilat W or Wo Pelvis 2-3 Views Left Result Date: 10/14/2023 EXAM: 2 or more VIEW(S) XRAY OF THE UNILATERAL HIP  10/14/2023 11:23:00 AM COMPARISON: None available. CLINICAL HISTORY: left hip pain. Per chart: Pt comes in for lower back pain and radiates to left hip. Pain has been there for a few days. Pt took a flexeril  and meloxicam  this morning with no relief. Pt walked slowly to room. A\T\Ox4. FINDINGS: BONES AND JOINTS: No acute fracture or focal osseous lesion. Mild degenerative changes of bilateral hip joints without significant joint space narrowing. Surgical clips overlie the sacrum. LUMBAR SPINE: Lower lumbar spine fusion hardware noted. SOFT TISSUES: The soft tissues are unremarkable. IMPRESSION: 1. No acute findings. Electronically signed by: Waddell Calk MD 10/14/2023 11:30 AM EDT RP Workstation: [id number]     Procedures   Medications Ordered in the ED  lidocaine  (LIDODERM ) 5 % 1 patch (1 patch Transdermal Patch Applied 10/14/23 1248)  acetaminophen  (TYLENOL ) tablet 1,000 mg (  1,000 mg Oral Given 10/14/23 1247)  dexamethasone  (DECADRON ) injection 10 mg (10 mg Intramuscular Given 10/14/23 1248)                                    Medical Decision Making Amount and/or Complexity of Data Reviewed Radiology: ordered.  Risk OTC drugs. Prescription drug management.    53 year old patient comes in with cc of acute back pain.  Patient has pertinent past medical history of Has previous history of spine surgery.  Differential diagnosis considered for this patient includes: - Degenerative disease of the back - Spondylitises/ spondylosis - Sciatica - Spinal cord compression / cord syndrome - Conus medullaris - Epidural hematoma - Epidural abscess - Lytic/pathologic fracture - Myelitis - Musculoskeletal pain  I have reviewed patient's previous records including MRI from May 2025.  Patient had the following findings:  IMPRESSION: Fusion hardware L4-5. Multilevel disc desiccation. No significant spinal stenosis is present. There is moderate caudal foraminal narrowing at L5-S1 secondary  to disc osteophyte. Correlation for mild L5 radiculopathy.  Patient had x-rays ordered by triage staff.  I have independently interpreted the following imaging: X-ray of the hip and the results, with the scope of emergent pathology and perspective, reveals no evidence of fracture or dislocation.  ASSESSMENT:  lumbar strain, degenerative disc disease without herniated disc, and osteoarthritis of lumbo-sacral spine  PLAN: For acute pain, rest, intermittent application of cold packs (later, may switch to heat, but do not sleep on heating pad), analgesics and muscle relaxants are recommended. Discussed longer term treatment plan of prn NSAID's and discussed a home back care exercise program with flexion exercise routine. Proper lifting with avoidance of heavy lifting discussed. Consider PCP followup for Physical Therapy and XRay studies if not improving.  Strict ER return precautions discussed.  Patient will return to the ER if they start developing worsening pain, new neurologic symptoms, urinary incontinence, retention, weakness or numbness.   Final diagnoses:  Degeneration of intervertebral disc of lumbar region without discogenic back pain or lower extremity pain    ED Discharge Orders     None          Charlyn Sora, MD 10/14/23 1312

## 2023-10-14 NOTE — Discharge Instructions (Signed)
 We saw you in the ER for back pain. Fortunately, our evaluation is not concerning for emergent pathology such as spinal cord compression or infection.   Often the pain is self limiting, you just need time and supportive medications. Take the narcotic medicine, muscle relaxant as needed, and see your spine doctor for further management if the symptoms continue to linger.   Please use the back exercises to strengthen the back muscles and be very careful with activities in future to prevent similar painful events.  Please return to the ER if your pain becomes excruciating or you start developing new numbness, weakness, urinary incontinence (peeing on self without warning), urinary retention (not able to pee despite bladder feeling full), inability to defecate, pins and needles sensation by your ano-genital area.

## 2023-10-14 NOTE — ED Triage Notes (Signed)
 Pt comes in for lower back pain and radiates to left hip. Pain has been there for a few days. Pt took a flexeril  and meloxicam  this morning with no relief. Pt walked slowly to room. A&Ox4.

## 2023-10-14 NOTE — ED Notes (Signed)
 Pt/family received d/c paperwork at this time. After going over the paperwork any questions, comments, or concerns were answered to the best of this nurse's knowledge. The pt/family verbally acknowledged the teachings/instructions.

## 2023-10-16 ENCOUNTER — Ambulatory Visit: Payer: Self-pay

## 2023-10-16 NOTE — Telephone Encounter (Signed)
 Appt made.

## 2023-10-16 NOTE — Telephone Encounter (Signed)
 FYI Only or Action Required?: FYI only for provider.  Patient was last seen in primary care on 10/12/2023 by Stephanie Suzzane POUR, MD.  Called Nurse Triage reporting Pain.  Symptoms began several days ago.  Interventions attempted: Prescription medications: prednisone , oxycodone .  Symptoms are: unchanged.  Triage Disposition: See PCP When Office is Open (Within 3 Days)  Patient/caregiver understands and will follow disposition?: Yes   Copied from CRM #[id number]. Topic: Clinical - Red Word Triage >> Oct 16, 2023 12:35 PM Amber H wrote: [facility] that prompted transfer to Nurse Triage: Patient called and stated she was in severe pain. Went to ER 10/11 due to back pain, issues taking care of personal hygiene due to pain, the ER gave her steroid injections, did xray that showed degenerative issues- pcp does not have an appt until next week. Reason for Disposition  [1] MODERATE back pain (e.g., interferes with normal activities) AND [2] present > 3 days  Answer Assessment - Initial Assessment Questions No available appts today with pcp, offered alternative provider today, pt declined. Scheduled appt tomorrow, 10/17/23.  Advised ED if symptoms worsen.  1. ONSET: When did the pain begin? (e.g., minutes, hours, days)     Last week 2. LOCATION: Where does it hurt? (upper, mid or lower back)     Mid to left side 3. SEVERITY: How bad is the pain?  (e.g., Scale 1-10; mild, moderate, or severe)     10/10 4. PATTERN: Is the pain constant? (e.g., yes, no; constant, intermittent)      Intermittent with movement 5. RADIATION: Does the pain shoot into your legs or somewhere else?     no 6. CAUSE:  What do you think is causing the back pain?      unsure  8. MEDICINES: What have you taken so far for the pain? (e.g., nothing, acetaminophen , NSAIDS)     Prednisone , oxycodone  9. NEUROLOGIC SYMPTOMS: Do you have any weakness, numbness, or problems with bowel/bladder control?      denies 10. OTHER SYMPTOMS: Do you have any other symptoms? (e.g., fever, abdomen pain, burning with urination, blood in urine)       no  Protocols used: Back Pain-A-AH

## 2023-10-17 ENCOUNTER — Ambulatory Visit: Payer: Self-pay | Admitting: Family Medicine

## 2023-10-18 ENCOUNTER — Ambulatory Visit (HOSPITAL_COMMUNITY): Admitting: Psychiatry

## 2023-10-18 DIAGNOSIS — F39 Unspecified mood [affective] disorder: Secondary | ICD-10-CM | POA: Diagnosis not present

## 2023-10-18 NOTE — Progress Notes (Unsigned)
 IN-PERSON  THERAPIST PROGRESS NOTE  Session Time: Wednesday  10/18/2023 4:05 PM - 4:50 PM                  Participation Level: Active  Behavioral Response: CasualAlertless anxious, less depressed,   Type of Therapy: Individual Therapy  Treatment Goals addressed:   Reduce episodes of irritability and lashing out to 1 x every 2 weeks for 30 days per pt's report      Pt will learn and implement relaxation techniques, will practice a relaxation technique daily    ProgressTowards Goals: progressing   Interventions: Supportive, CBT   Summary: Stephanie Cannon is a 53 y.o. female who is referred for services by PCP Dr. Antonetta due to pt experiencing symptoms of anxiety. Pt sees psychiatrist Dr. Arfeen for medication management. Pt denies any psychiatric hospitalizations. Pt participated in teleheath counseling with son last year. Pt reports experiencing grief and loss issues related to the death  of a cousin who was like a sister in July 2024. She states she can't hardly talk about her without becoming very emotional. She also reports stress related to recently being injured on the job. She says she has been made to feel like I am the blame by her employer. She reports financial stress. She reports additional stress regarding conflict between son and husband resulting in them not talking to leach other since July 2024. She also worries about her mother as she suspects mother may have dementia. Pt's current symptoms include sadness, worry, tearfulness,  difficulty concentration, fatigue, irritability, sleep difficulty, and restlessness.          Patient last was seen about 3-4 weeks  ago.  She reports increased stress, frustration, irritability, anger, and anxiety since last session.  Per patient's report she is experiencing increased stress at work due to superiors nit picking.  Patient reports incidence of being reprimanded for things that other people are doing but yet not being reprimanded.   Patient states feeling as though she is walking on eggshells and reports titrating to attend work.  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  Patient continues to attend work regularly but reports she does not take any breaks except for lunch.  She expresses concern about her weight gain but not wanting to do anything like exercise when she gets home in the evening.  Suicidal/Homicidal: Nowithout intent/plan               Therapist Response: Reviewed symptoms,  discussed stressors, facilitated expression of thoughts and feelings, validated feelings, assisted patient identify/challenge/and replace negative thoughts about taking breaks, developed plan with patient to begin taking breaks at least few days a week to help manage stress, also assisted patient identify possible activities to pursue 1 day/week such as light exercise or pleasurable activity at least 1 evening per week, assisted patient identify/address thoughts and processes that may inhibit implementation of plan, encouraged patient to continue practicing deep breathing Plan: Return again in 2 weeks.  Diagnosis: Mood disorder  Collaboration of Care: Psychiatrist AEB patient sees psychiatrist Dr. Arfeen for medication management  Patient/Guardian was advised Release of Information must be obtained prior to any record release in order to collaborate their care with an outside provider. Patient/Guardian was advised if they have not already done so to contact the registration department to sign all necessary forms in order for us  to release information regarding their  care.   Consent: Patient/Guardian gives verbal consent for treatment and assignment of benefits for services provided during this visit. Patient/Guardian expressed understanding and agreed to proceed.   Winton FORBES Rubinstein, LCSW 10/18/2023

## 2023-10-19 ENCOUNTER — Telehealth (HOSPITAL_COMMUNITY): Payer: Self-pay | Admitting: *Deleted

## 2023-10-19 ENCOUNTER — Encounter (HOSPITAL_COMMUNITY): Payer: Self-pay

## 2023-10-19 NOTE — Telephone Encounter (Signed)
 Okay to provide few days off from work.  If symptoms do not improve may need a sooner appointment.

## 2023-10-19 NOTE — Telephone Encounter (Signed)
 Pt messaged the office today stating that she is having continued difficulties at work which is causing increased anxiety, increased anger, headaches, and decreased sleep. Pt says she is being bullied and is now working with HR to hopefully resolve some of these issues, but feels that she needs some time away and is requesting a work Engineer, [employer].   Last visit: 08/10/23 Next visit: 11/10/23

## 2023-10-25 ENCOUNTER — Other Ambulatory Visit: Payer: Self-pay | Admitting: Internal Medicine

## 2023-10-25 ENCOUNTER — Encounter: Payer: Self-pay | Admitting: Family Medicine

## 2023-10-25 ENCOUNTER — Telehealth: Payer: Self-pay

## 2023-10-25 DIAGNOSIS — F411 Generalized anxiety disorder: Secondary | ICD-10-CM

## 2023-10-25 MED ORDER — ALPRAZOLAM 1 MG PO TABS: 1.0000 mg | ORAL_TABLET | Freq: Two times a day (BID) | ORAL | 0 refills | Status: DC | PRN

## 2023-10-25 NOTE — Telephone Encounter (Signed)
 Refill request sent to dr patel since dr simpson is out of office

## 2023-10-25 NOTE — Telephone Encounter (Signed)
 Patient advised.

## 2023-10-25 NOTE — Telephone Encounter (Signed)
 Copied from CRM [id number]. Topic: Clinical - Prescription Issue >> Oct 25, 2023 11:20 AM Stephanie Cannon wrote: Reason for CRM: pt has been waiting on ALPRAZolam  (XANAX ) 1 MG tablet [[id number]] ENDED since her provider is on leave. Please advise

## 2023-10-27 ENCOUNTER — Telehealth: Payer: Self-pay | Admitting: Family Medicine

## 2023-10-27 NOTE — Telephone Encounter (Signed)
 FMLA forms Copied Noted Sleeved Original placed provider box Copy placed front desk folder

## 2023-10-29 ENCOUNTER — Other Ambulatory Visit: Payer: Self-pay | Admitting: Family Medicine

## 2023-11-01 ENCOUNTER — Ambulatory Visit (HOSPITAL_COMMUNITY): Admitting: Psychiatry

## 2023-11-01 DIAGNOSIS — F39 Unspecified mood [affective] disorder: Secondary | ICD-10-CM

## 2023-11-01 NOTE — Progress Notes (Unsigned)
 IN-PERSON  THERAPIST PROGRESS NOTE  Session Time: Wednesday  11/01/2023 4:08 PM -  4:58 PM                 Participation Level: Active  Behavioral Response: CasualAlertless anxious, less depressed,   Type of Therapy: Individual Therapy  Treatment Goals addressed:   Reduce episodes of irritability and lashing out to 1 x every 2 weeks for 30 days per pt's report      Pt will learn and implement relaxation techniques, will practice a relaxation technique daily    ProgressTowards Goals: progressing   Interventions: Supportive, CBT   Summary: Stephanie Cannon is a 53 y.o. female who is referred for services by PCP Dr. Antonetta due to pt experiencing symptoms of anxiety. Pt sees psychiatrist Dr. Arfeen for medication management. Pt denies any psychiatric hospitalizations. Pt participated in teleheath counseling with son last year. Pt reports experiencing grief and loss issues related to the death  of a cousin who was like a sister in July 2024. She states she can't hardly talk about her without becoming very emotional. She also reports stress related to recently being injured on the job. She says she has been made to feel like I am the blame by her employer. She reports financial stress. She reports additional stress regarding conflict between son and husband resulting in them not talking to leach other since July 2024. She also worries about her mother as she suspects mother may have dementia. Pt's current symptoms include sadness, worry, tearfulness,  difficulty concentration, fatigue, irritability, sleep difficulty, and restlessness.          Patient last was seen about 2 weeks  ago.  She reports continued stress but decreased anxiety, irritability, and anger since last session.  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  She also reports  implementing plan of taking breaks at work and says this has been very helpful.  She has been practicing deep breathing regularly.  She reports nurturing her spirituality by increasing efforts to read her Bible has been helpful.  She has not implemented plan of exercising yet as she is waiting to order a piece of exercise equipment she reports still lashing out at home and reports doing this about 2 times per week. Suicidal/Homicidal: Nowithout intent/plan               Therapist Response: Reviewed symptoms, praised and reinforced patient implementing plan taking breaks at work, discussed effects of plan, praised and reinforced patient's use of deep breathing/nurturing her spirituality as coping techniques, discussed effects of use, encouraged patient to follow through with plan to increase physical activity, discussed stressors, facilitated expression of thoughts and feelings, validated feelings, assisted patient do ABC analysis of recent interaction with husband resulted in patient lashing out, assisted patient identify ways to disrupt her negative pattern of reacting including deep breathing/self talk, began to discuss the role of use of assertiveness skills versus aggressive communication, developed plan with patient to implement strategies discussed in session  Diagnosis: Mood disorder  Collaboration of Care: Psychiatrist AEB patient sees psychiatrist Dr. Arfeen for medication management  Patient/Guardian was advised Release of Information must be obtained prior to any record release in order to collaborate their care with an outside provider. Patient/Guardian was advised if they have not already done so to contact the registration department  to sign all necessary forms in order for us  to release information regarding their care.   Consent: Patient/Guardian gives verbal consent for treatment and assignment of benefits for services provided during this visit. Patient/Guardian expressed understanding and  agreed to proceed.   Winton FORBES Rubinstein, LCSW 11/01/2023

## 2023-11-02 ENCOUNTER — Telehealth: Payer: Self-pay

## 2023-11-02 NOTE — Telephone Encounter (Signed)
 Copied from CRM #[id number]. Topic: General - Call Back - No Documentation >> Nov 02, 2023  9:11 AM Tonda B wrote: Reason for CRM: pt is calling to say that she do not need the fmla paperwork id there are any questions call [phone]

## 2023-11-02 NOTE — Telephone Encounter (Signed)
 Tried to call patient to see what the FMLA form was to be completed for. We usually fill out several for her so needing to verify

## 2023-11-08 ENCOUNTER — Encounter: Payer: Self-pay | Admitting: Family Medicine

## 2023-11-08 ENCOUNTER — Encounter (HOSPITAL_COMMUNITY): Payer: Self-pay | Admitting: Psychiatry

## 2023-11-08 ENCOUNTER — Telehealth (HOSPITAL_BASED_OUTPATIENT_CLINIC_OR_DEPARTMENT_OTHER): Admitting: Psychiatry

## 2023-11-08 VITALS — Wt 240.0 lb

## 2023-11-08 DIAGNOSIS — F411 Generalized anxiety disorder: Secondary | ICD-10-CM | POA: Diagnosis not present

## 2023-11-08 DIAGNOSIS — F5101 Primary insomnia: Secondary | ICD-10-CM

## 2023-11-08 DIAGNOSIS — F331 Major depressive disorder, recurrent, moderate: Secondary | ICD-10-CM

## 2023-11-08 MED ORDER — LAMOTRIGINE 200 MG PO TABS: ORAL_TABLET | ORAL | 0 refills | Status: AC

## 2023-11-08 MED ORDER — AMITRIPTYLINE HCL 50 MG PO TABS: 50.0000 mg | ORAL_TABLET | Freq: Every day | ORAL | 0 refills | Status: DC

## 2023-11-08 MED ORDER — BUSPIRONE HCL 10 MG PO TABS: 10.0000 mg | ORAL_TABLET | Freq: Two times a day (BID) | ORAL | 2 refills | Status: AC

## 2023-11-08 NOTE — Progress Notes (Signed)
 [facility] MD Virtual Progress Note   Patient Location: In Car Provider Location: Home Office  I connect with patient by video and verified that I am speaking with correct person by using two identifiers. I discussed the limitations of evaluation and management by telemedicine and the availability of in person appointments. I also discussed with the patient that there may be a patient responsible charge related to this service. The patient expressed understanding and agreed to proceed.  Stephanie Cannon [id number] 53 y.o.  11/08/2023 11:03 AM  History of Present Illness:  Patient is evaluated by video session.  She is concerned about weight gain.  Since the last visit she gained another 8 pounds.  She admitted not watching her calorie intake, exercising or walking either.  She feels the medicine causing side effects.  She has a paper and she wrote about her current medicine she is taking and the possible side effects.  Patient told due to the stress she is gaining weight and some of the medication may not be helping to lose weight.  Her biggest stressor is her current job.  She gets some time irritated with the work environment.  She is supposed to have a meeting with employment office complaint center but meeting was rescheduled because government shutdown.  She is working at [employer] as a psychologist, forensic for past 7 years.  She has invested 17 years in the state and does not want to quit her work.  She reported chronic pain.  She is supposed to see her PCP but she is out but coming back next week.  She is getting Cymbalta  from neurologist for neuropathy and back pain.  She is getting Xanax  from PCP to help her anxiety and panic attack.  She is also taking BuSpar , amitriptyline  and Lamictal .  She has no rash, itching tremors or shakes.  She denies any active or passive suicidal thoughts or homicidal thoughts.  She admitted some time irritable, short temper.  She denies any paranoia or  any delusions.  Past Psychiatric History: No h/o inpatient treatment or suicidal attempt.  Saw Dr. Okey twice but no further follow-ups.  Saw therapist but reported to help her son. Tried Prozac  temazepam , Ambien, melatonin and recently BuSpar . No history of psychosis.   Past Medical History:  Diagnosis Date   Arthritis    Asthma    [employer]reesia 07/31/2019   Breast discharge    left spont white discharge x 6 months   Carpal tunnel syndrome    left, wears brace at night   Chronic back pain    GERD (gastroesophageal reflux disease)    Hyperlipidemia    Hypertension    Obesity     Outpatient Encounter Medications as of 11/08/2023  Medication Sig   acetaminophen  (TYLENOL ) 500 MG tablet Take 1 tablet (500 mg total) by mouth every 6 (six) hours as needed.   ALPRAZolam  (XANAX ) 1 MG tablet Take 1 tablet (1 mg total) by mouth 2 (two) times daily as needed for anxiety.   amitriptyline  (ELAVIL ) 100 MG tablet Take 1 tablet (100 mg total) by mouth at bedtime.   busPIRone  (BUSPAR ) 10 MG tablet Take 1 tablet (10 mg total) by mouth 2 (two) times daily.   celecoxib  (CELEBREX ) 100 MG capsule Take 1 capsule (100 mg total) by mouth 2 (two) times daily.   Cranberry 50 MG CHEW Chew 1 tablet by mouth in the morning.   cyclobenzaprine  (FLEXERIL ) 10 MG tablet Take 1 tablet (10 mg total) by mouth  at bedtime.   DULoxetine  (CYMBALTA ) 60 MG capsule Take 1 capsule (60 mg total) by mouth daily.   esomeprazole  (NEXIUM ) 40 MG capsule Take 1 capsule (40 mg total) by mouth 2 (two) times daily before a meal.   ezetimibe  (ZETIA ) 10 MG tablet Take 1 tablet (10 mg total) by mouth daily.   fluticasone  (FLONASE ) 50 MCG/ACT nasal spray Place 2 sprays into both nostrils daily.   HYDROcodone -acetaminophen  (NORCO/VICODIN) 5-325 MG tablet Take 1-2 tablets by mouth every 6 (six) hours as needed.   lamoTRIgine  (LAMICTAL ) 200 MG tablet Take 1 tab daily   lidocaine  4 % Place 1 patch onto the skin 2 (two) times daily.   methocarbamol   (ROBAXIN ) 500 MG tablet Take 500 mg by mouth every 8 (eight) hours as needed.   MIRALAX 17 GM/SCOOP powder SMARTSIG:1 scoopful By Mouth Every Evening PRN   Multiple Vitamins-Minerals (ONE-A-DAY 50 PLUS PO) Take 2 tablets by mouth daily with breakfast.   oxyCODONE -acetaminophen  (PERCOCET/ROXICET) 5-325 MG tablet Take 1 tablet by mouth every 12 (twelve) hours.   potassium chloride  (KLOR-CON ) 10 MEQ tablet Take 10 mEq by mouth 2 (two) times daily.   traMADol  (ULTRAM ) 50 MG tablet Take 50-100 mg by mouth 2 (two) times daily as needed.   triamterene -hydrochlorothiazide  (MAXZIDE ) 75-50 MG tablet Take 1 tablet by mouth daily.   No facility-administered encounter medications on file as of 11/08/2023.    No results found for this or any previous visit (from the past 2160 hours).   Psychiatric Specialty Exam: Physical Exam  Review of Systems  Musculoskeletal:  Positive for back pain.  Neurological:  Positive for numbness.    Weight 240 lb (108.9 kg).There is no height or weight on file to calculate BMI.  General Appearance: Casual  Eye Contact:  Good  Speech:  Slow  Volume:  Decreased  Mood:  Dysphoric  Affect:  Congruent  Thought Process:  Goal Directed  Orientation:  Full (Time, Place, and Person)  Thought Content:  Rumination  Suicidal Thoughts:  No  Homicidal Thoughts:  No  Memory:  Immediate;   Good Recent;   Good Remote;   Good  Judgement:  Intact  Insight:  Present  Psychomotor Activity:  Decreased  Concentration:  Concentration: Fair and Attention Span: Fair  Recall:  Good  Fund of Knowledge:  Good  Language:  Good  Akathisia:  No  Handed:  Right  AIMS (if indicated):     Assets:  Communication Skills Desire for Improvement Housing Talents/Skills Transportation  ADL's:  Intact  Cognition:  WNL  Sleep:  fair       05/31/2023    2:07 PM 04/21/2023    3:23 PM 12/12/2022    3:07 PM 11/15/2022   11:08 AM 11/02/2022    9:09 AM  Depression screen PHQ 2/9  Decreased  Interest 3 1 3  0 1  Down, Depressed, Hopeless 3 2 1 1 1   PHQ - 2 Score 6 3 4 1 2   Altered sleeping 3 3 3 2 1   Tired, decreased energy 3 2 1 3 1   Change in appetite 3 1 1 3  0  Feeling bad or failure about yourself  1 1 1  0 1  Trouble concentrating 3 3 3 1 1   Moving slowly or fidgety/restless 0 3 3 2 1   Suicidal thoughts 0 1 0 0 0  PHQ-9 Score 19 17 16 12 7   Difficult doing work/chores Very difficult Extremely dIfficult   Very difficult    Assessment/Plan: MDD (major depressive  disorder), recurrent episode, moderate (HCC) - Plan: busPIRone  (BUSPAR ) 10 MG tablet  GAD (generalized anxiety disorder) - Plan: amitriptyline  (ELAVIL ) 50 MG tablet, busPIRone  (BUSPAR ) 10 MG tablet, lamoTRIgine  (LAMICTAL ) 200 MG tablet  Primary insomnia - Plan: amitriptyline  (ELAVIL ) 50 MG tablet, busPIRone  (BUSPAR ) 10 MG tablet  Patient is 53 year old African-American employed female with history of hypertension, chronic back pain, neuropathy, muscle spasm, major depressive disorder, anxiety and insomnia.  She had requested FMLA intermittent because of flareup of symptoms however find out that she is out of days and if she has to take the day off it will be without pay.  She decided not to take the day off.  She is trying to build up her time.  Discussed job stress.  Encouraged to contact employment office to discuss about her work environment and concerns.  Discussed weight gain, possibility of polypharmacy and lack of exercise and not watching calorie intake.  Will try reducing amitriptyline  from 100 mg to 50 mg to see if that helps some of her weight.  Encourage to see Winton Rubinstein regularly to help her coping skills.  She is also going to talk to her PCP to consider weight loss medication.  So far no rash, itching, tremors or shakes from Lamictal .  Continue Lamictal  100 mg daily, BuSpar  10 mg 3 times a day and she will reduce amitriptyline  50 mg at bedtime.  She is also taking Cymbalta  prescribed by neurology and Xanax   prescribed by PCP.  Discussed polypharmacy.  Recommend to call back if she has any question or any concern.  Follow-up in 3 months.   Follow Up Instructions:     I discussed the assessment and treatment plan with the patient. The patient was provided an opportunity to ask questions and all were answered. The patient agreed with the plan and demonstrated an understanding of the instructions.   The patient was advised to call back or seek an in-person evaluation if the symptoms worsen or if the condition fails to improve as anticipated.    Collaboration of Care: Other provider involved in patient's care AEB notes are available in epic to review  Patient/Guardian was advised Release of Information must be obtained prior to any record release in order to collaborate their care with an outside provider. Patient/Guardian was advised if they have not already done so to contact the registration department to sign all necessary forms in order for us  to release information regarding their care.   Consent: Patient/Guardian gives verbal consent for treatment and assignment of benefits for services provided during this visit. Patient/Guardian expressed understanding and agreed to proceed.     Total encounter time 27 minutes which includes face-to-face time, chart reviewed, care coordination, order entry and documentation during this encounter.   Note: This document was prepared by [employer] voice dictation technology and any errors that results from this process are unintentional.    Leni ONEIDA Client, MD 11/08/2023

## 2023-11-09 ENCOUNTER — Ambulatory Visit: Admitting: Gastroenterology

## 2023-11-09 ENCOUNTER — Encounter: Payer: Self-pay | Admitting: Neurology

## 2023-11-09 ENCOUNTER — Ambulatory Visit
Admission: RE | Admit: 2023-11-09 | Discharge: 2023-11-09 | Disposition: A | Discharge status: Home / Self Care | Source: Ambulatory Visit | Attending: Family Medicine | Admitting: Family Medicine

## 2023-11-09 DIAGNOSIS — Z1231 Encounter for screening mammogram for malignant neoplasm of breast: Secondary | ICD-10-CM

## 2023-11-09 MED ORDER — TIRZEPATIDE-WEIGHT MANAGEMENT 2.5 MG/0.5ML ~~LOC~~ SOLN: 2.5000 mg | SUBCUTANEOUS | 0 refills | Status: AC

## 2023-11-10 ENCOUNTER — Telehealth (HOSPITAL_COMMUNITY): Admitting: Psychiatry

## 2023-11-15 ENCOUNTER — Ambulatory Visit (HOSPITAL_COMMUNITY): Admitting: Psychiatry

## 2023-11-15 DIAGNOSIS — F411 Generalized anxiety disorder: Secondary | ICD-10-CM

## 2023-11-15 DIAGNOSIS — F39 Unspecified mood [affective] disorder: Secondary | ICD-10-CM | POA: Diagnosis not present

## 2023-11-15 NOTE — Progress Notes (Signed)
 IN-PERSON  THERAPIST PROGRESS NOTE  Session Time: Wednesday  11/15/2023 4:15 PM - 4:45 PM              Participation Level: Active  Behavioral Response: CasualAlert anxious, depressed,   Type of Therapy: Individual Therapy  Treatment Goals addressed:   Reduce episodes of irritability and lashing out to 1 x every 2 weeks for 30 days per pt's report      Pt will learn and implement relaxation techniques, will practice a relaxation technique daily    ProgressTowards Goals: progressing   Interventions: Supportive, CBT   Summary: Stephanie Cannon is a 53 y.o. female who is referred for services by PCP Dr. Antonetta due to pt experiencing symptoms of anxiety. Pt sees psychiatrist Dr. Arfeen for medication management. Pt denies any psychiatric hospitalizations. Pt participated in teleheath counseling with son last year. Pt reports experiencing grief and loss issues related to the death  of a cousin who was like a sister in July 2024. She states she can't hardly talk about her without becoming very emotional. She also reports stress related to recently being injured on the job. She says she has been made to feel like I am the blame by her employer. She reports financial stress. She reports additional stress regarding conflict between son and husband resulting in them not talking to leach other since July 2024. She also worries about her mother as she suspects mother may have dementia. Pt's current symptoms include sadness, worry, tearfulness,  difficulty concentration, fatigue, irritability, sleep difficulty, and restlessness.          Patient last was seen about 2 weeks  ago.  She reports increased stress at work as she reports increased frustration regarding changes in rules and policy regarding her work.  She reports feeling singled out by her supervisor and she suspects they are trying to get rid of her per patient's report.  She continues to take breaks at work as well as practice her deep  breathing.  She expresses frustration as she starts experiencing anxiety and nervousness on Sunday nights dreading to have to return to work the next day.  She is involved in other activities the weekend and enjoys being at home.  She is pleased she has not had any more outbursts at home and reports decreased irritability.  Suicidal/Homicidal: Nowithout intent/plan               Therapist Response: Reviewed symptoms, discussed stressors, facilitated expression of thoughts and feelings, validated feelings, praised and reinforced patient's continued efforts to take breaks and practice deep breathing, discussed rationale for and develop plan with patient to practice body scan meditation throughout the day to help manage stress and anxiety, checked out interactive audio activity to patient and provided patient with access code to assist her in her efforts,  strategies discussed in session  Diagnosis: Mood disorder  Collaboration of Care: Psychiatrist AEB patient sees psychiatrist Dr. Arfeen for medication management  Patient/Guardian was advised Release of Information must be obtained prior to any record release in order to collaborate their care with an outside provider. Patient/Guardian was advised if they have not already done so to contact the registration department to sign all necessary forms in order for us  to release information regarding their care.   Consent: Patient/Guardian gives verbal consent for treatment and assignment of benefits for services provided during this visit. Patient/Guardian expressed understanding and agreed to proceed.   Winton FORBES Rubinstein, LCSW 11/15/2023

## 2023-11-19 ENCOUNTER — Other Ambulatory Visit: Payer: Self-pay | Admitting: Internal Medicine

## 2023-11-19 DIAGNOSIS — F411 Generalized anxiety disorder: Secondary | ICD-10-CM

## 2023-11-20 ENCOUNTER — Telehealth: Payer: Self-pay

## 2023-11-20 MED ORDER — ALPRAZOLAM 1 MG PO TABS: 1.0000 mg | ORAL_TABLET | Freq: Two times a day (BID) | ORAL | 2 refills | Status: DC | PRN

## 2023-11-20 NOTE — Telephone Encounter (Signed)
Medication is sent in 

## 2023-11-20 NOTE — Telephone Encounter (Signed)
 Pt needs refill of Alprazolam  1mg 

## 2023-11-20 NOTE — Addendum Note (Signed)
 Addended by: ANTONETTA ROLLENE BRAVO on: 11/20/2023 09:59 AM   Modules accepted: Orders

## 2023-11-23 ENCOUNTER — Encounter: Payer: Self-pay | Admitting: Family Medicine

## 2023-11-24 NOTE — Telephone Encounter (Signed)
 I reviewed the chart, called patient, she has been on Cymbalta  60 mg daily for few years, multiple positive report previously  He is also under the care of psychiatrist Dr. Syed Arfeen, started amitriptyline  50 mg at bedtime on November 08, 2023, the new medication might contributed to a lot of her current concern including dry mouth, fatigue  I suggest her to contact primary care or psychiatrist for medication adjustment

## 2023-12-04 ENCOUNTER — Encounter: Payer: Self-pay | Admitting: Family Medicine

## 2023-12-06 ENCOUNTER — Encounter (HOSPITAL_COMMUNITY): Payer: Self-pay

## 2023-12-07 ENCOUNTER — Ambulatory Visit (INDEPENDENT_AMBULATORY_CARE_PROVIDER_SITE_OTHER): Payer: Self-pay | Admitting: Family Medicine

## 2023-12-07 ENCOUNTER — Encounter: Payer: Self-pay | Admitting: Family Medicine

## 2023-12-07 VITALS — BP 160/100 | HR 106 | Resp 16 | Ht 61.0 in | Wt 240.1 lb

## 2023-12-07 DIAGNOSIS — I10 Essential (primary) hypertension: Secondary | ICD-10-CM

## 2023-12-07 DIAGNOSIS — E559 Vitamin D deficiency, unspecified: Secondary | ICD-10-CM | POA: Diagnosis not present

## 2023-12-07 DIAGNOSIS — E785 Hyperlipidemia, unspecified: Secondary | ICD-10-CM | POA: Diagnosis not present

## 2023-12-07 DIAGNOSIS — F409 Phobic anxiety disorder, unspecified: Secondary | ICD-10-CM | POA: Diagnosis not present

## 2023-12-07 DIAGNOSIS — F411 Generalized anxiety disorder: Secondary | ICD-10-CM

## 2023-12-07 DIAGNOSIS — R7303 Prediabetes: Secondary | ICD-10-CM | POA: Diagnosis not present

## 2023-12-07 DIAGNOSIS — Z23 Encounter for immunization: Secondary | ICD-10-CM

## 2023-12-07 DIAGNOSIS — F5105 Insomnia due to other mental disorder: Secondary | ICD-10-CM | POA: Diagnosis not present

## 2023-12-07 MED ORDER — PHENTERMINE HCL 37.5 MG PO TABS: 37.5000 mg | ORAL_TABLET | Freq: Every day | ORAL | 3 refills | Status: AC

## 2023-12-07 MED ORDER — OLMESARTAN MEDOXOMIL 20 MG PO TABS: 20.0000 mg | ORAL_TABLET | Freq: Every day | ORAL | 3 refills | Status: DC

## 2023-12-07 MED ORDER — EZETIMIBE 10 MG PO TABS: 10.0000 mg | ORAL_TABLET | Freq: Every day | ORAL | 1 refills | Status: AC

## 2023-12-07 MED ORDER — ALPRAZOLAM 1 MG PO TABS: ORAL_TABLET | ORAL | 3 refills | Status: AC

## 2023-12-07 NOTE — Patient Instructions (Addendum)
 F/U early February  Pneumonia vaccine today  Nurse BP check 12/21/2023, Hepatitis B #1 at that visit  Do NOT start phentermine  until BP is controlled  New additional medication for blood pressure is olmesartan  20 mg once daily continue triamterene  as before  Fasting CBC, lipid, cmp and EGFr, tSH, HBA1C and vit D any [facility]  as soon as possible  Thanks for choosing [facility], we consider it a privelige to serve you.

## 2023-12-12 ENCOUNTER — Telehealth (HOSPITAL_COMMUNITY): Payer: Self-pay | Admitting: *Deleted

## 2023-12-12 ENCOUNTER — Encounter (HOSPITAL_COMMUNITY): Payer: Self-pay | Admitting: *Deleted

## 2023-12-12 NOTE — Telephone Encounter (Signed)
 Pt is asking for accomodation to work from home 2 to 3 days per week. Please review and advise.

## 2023-12-12 NOTE — Telephone Encounter (Signed)
 If her employer allows then I am ok with accommodation.

## 2023-12-14 ENCOUNTER — Ambulatory Visit: Payer: Self-pay | Admitting: Family Medicine

## 2023-12-14 ENCOUNTER — Encounter: Payer: Self-pay | Admitting: Family Medicine

## 2023-12-14 DIAGNOSIS — Z23 Encounter for immunization: Secondary | ICD-10-CM | POA: Insufficient documentation

## 2023-12-14 LAB — CBC WITH DIFFERENTIAL/PLATELET
Basophils Absolute: 0 x10E3/uL (ref 0.0–0.2)
Basos: 1 %
EOS (ABSOLUTE): 0.1 x10E3/uL (ref 0.0–0.4)
Eos: 1 %
Hematocrit: 45.3 % (ref 34.0–46.6)
Hemoglobin: 14.8 g/dL (ref 11.1–15.9)
Immature Grans (Abs): 0 x10E3/uL (ref 0.0–0.1)
Immature Granulocytes: 0 %
Lymphocytes Absolute: 1.8 x10E3/uL (ref 0.7–3.1)
Lymphs: 27 %
MCH: 30.2 pg (ref 26.6–33.0)
MCHC: 32.7 g/dL (ref 31.5–35.7)
MCV: 92 fL (ref 79–97)
Monocytes Absolute: 0.4 x10E3/uL (ref 0.1–0.9)
Monocytes: 6 %
Neutrophils Absolute: 4.4 x10E3/uL (ref 1.4–7.0)
Neutrophils: 65 %
Platelets: 243 x10E3/uL (ref 150–450)
RBC: 4.9 x10E6/uL (ref 3.77–5.28)
RDW: 13.7 % (ref 11.7–15.4)
WBC: 6.7 x10E3/uL (ref 3.4–10.8)

## 2023-12-14 LAB — CMP14+EGFR
ALT: 17 IU/L (ref 0–32)
AST: 21 IU/L (ref 0–40)
Albumin: 4.3 g/dL (ref 3.8–4.9)
Alkaline Phosphatase: 116 IU/L (ref 49–135)
BUN/Creatinine Ratio: 16 (ref 9–23)
BUN: 16 mg/dL (ref 6–24)
Bilirubin Total: 0.3 mg/dL (ref 0.0–1.2)
CO2: 27 mmol/L (ref 20–29)
Calcium: 9.6 mg/dL (ref 8.7–10.2)
Chloride: 101 mmol/L (ref 96–106)
Creatinine, Ser: 0.97 mg/dL (ref 0.57–1.00)
Globulin, Total: 2.8 g/dL (ref 1.5–4.5)
Glucose: 79 mg/dL (ref 70–99)
Potassium: 4.3 mmol/L (ref 3.5–5.2)
Sodium: 140 mmol/L (ref 134–144)
Total Protein: 7.1 g/dL (ref 6.0–8.5)
eGFR: 69 mL/min/1.73 (ref >59)

## 2023-12-14 LAB — LIPID PANEL
Chol/HDL Ratio: 2.7 ratio (ref 0.0–4.4)
Cholesterol, Total: 197 mg/dL (ref 100–199)
HDL: 73 mg/dL (ref >39)
LDL Chol Calc (NIH): 113 mg/dL — ABNORMAL HIGH (ref 0–99)
Triglycerides: 61 mg/dL (ref 0–149)
VLDL Cholesterol Cal: 11 mg/dL (ref 5–40)

## 2023-12-14 LAB — VITAMIN D 25 HYDROXY (VIT D DEFICIENCY, FRACTURES): Vit D, 25-Hydroxy: 26.3 ng/mL — ABNORMAL LOW (ref 30.0–100.0)

## 2023-12-14 LAB — HEMOGLOBIN A1C
Est. average glucose Bld gHb Est-mCnc: 111 mg/dL
Hgb A1c MFr Bld: 5.5 % (ref 4.8–5.6)

## 2023-12-14 LAB — TSH: TSH: 1.21 u[IU]/mL (ref 0.450–4.500)

## 2023-12-14 NOTE — Assessment & Plan Note (Signed)
 Currently uncontrolled, treated by Psychiatry and posychology Exceesive life stresses from every angle

## 2023-12-14 NOTE — Assessment & Plan Note (Addendum)
 Uncontrolled depression and anxiety are major contributors to excess weight gain pt is experiencing, a lot of life stress Patient re-educated about  the importance of commitment to a  minimum of 150 minutes of exercise per week as able.  The importance of healthy food choices with portion control discussed, as well as eating regularly and within a 12 hour window most days. The need to choose clean , green food 50 to 75% of the time is discussed, as well as to make water  the primary drink and set a goal of 64 ounces water  daily.       12/07/2023    1:23 PM 11/08/2023   11:09 AM 10/14/2023   10:57 AM  Weight /BMI  Weight 240 lb 1.3 oz  240 lb  Height 5' 1 (1.549 m)  5' 1 (1.549 m)  BMI 45.36 kg/m2  45.35 kg/m2     Information is confidential and restricted. Go to Review Flowsheets to unlock data.    Deteriorated , to start phentermine  when BP is controlled

## 2023-12-14 NOTE — Assessment & Plan Note (Signed)
 Patient educated about the importance of limiting  Carbohydrate intake , the need to commit to daily physical activity for a minimum of 30 minutes , and to commit weight loss. The fact that changes in all these areas will reduce or eliminate all together the development of diabetes is stressed.      Latest Ref Rng & Units 12/13/2023   10:32 AM 06/01/2023   12:08 PM 03/20/2023    3:23 PM 12/08/2022    1:28 PM 12/08/2022    1:26 PM  Diabetic Labs  HbA1c 4.8 - 5.6 % 5.5       Chol 100 - 199 mg/dL 802   752  794    HDL >60 mg/dL 73   85  77    Calc LDL 0 - 99 mg/dL 886   846  876    Triglycerides 0 - 149 mg/dL 61   58  26    Creatinine 0.57 - 1.00 mg/dL 9.02  9.03  9.16   9.14       12/07/2023    1:57 PM 12/07/2023    1:23 PM 11/08/2023   11:09 AM 10/14/2023   11:01 AM 10/14/2023   10:57 AM 08/10/2023    9:51 AM 08/08/2023    2:04 PM  BP/Weight  Systolic BP 160 150  139   136  Diastolic BP 100 98  84   84  Wt. (Lbs)  240.08   240  231  BMI  45.36 kg/m2   45.35 kg/m2  43.65 kg/m2     Information is confidential and restricted. Go to Review Flowsheets to unlock data.       No data to display          Updated lab needed at/ before next visit.

## 2023-12-14 NOTE — Assessment & Plan Note (Addendum)
 Uncontrolled add olmesartan  20 mg daily DASH diet and commitment to daily physical activity for a minimum of 30 minutes discussed and encouraged, as a part of hypertension management. The importance of attaining a healthy weight is also discussed.     12/07/2023    1:57 PM 12/07/2023    1:23 PM 11/08/2023   11:09 AM 10/14/2023   11:01 AM 10/14/2023   10:57 AM 08/10/2023    9:51 AM 08/08/2023    2:04 PM  BP/Weight  Systolic BP 160 150  139   136  Diastolic BP 100 98  84   84  Wt. (Lbs)  240.08   240  231  BMI  45.36 kg/m2   45.35 kg/m2  43.65 kg/m2     Information is confidential and restricted. Go to Review Flowsheets to unlock data.     BP check in 2 weeks

## 2023-12-14 NOTE — Assessment & Plan Note (Signed)
 Updated lab needed at/ before next visit.

## 2023-12-14 NOTE — Assessment & Plan Note (Signed)
 Hyperlipidemia:Low fat diet discussed and encouraged.   Lipid Panel  Lab Results  Component Value Date   CHOL 197 12/13/2023   HDL 73 12/13/2023   LDLCALC 113 (H) 12/13/2023   TRIG 61 12/13/2023   CHOLHDL 2.7 12/13/2023     Needs to reduce dietary fat

## 2023-12-14 NOTE — Assessment & Plan Note (Signed)
 After obtaining informed consent, the pneumonia 20  vaccine is  administered , with no adverse effect noted at the time of administration.

## 2023-12-14 NOTE — Assessment & Plan Note (Signed)
Sleep hygiene reviewed and written information offered also. Prescription sent for  medication needed. Continue bedtime xanax

## 2023-12-14 NOTE — Progress Notes (Signed)
 Stephanie Cannon     MRN: [id number]      DOB: 1970/02/10  Chief Complaint  Patient presents with   Weight Gain    Pt has concerns about weight gain yet loss of appetite. Pt would like to have her cortisol levels checked     HPI Stephanie Cannon is here for follow up and re-evaluation of chronic medical conditions, medication management and review of any available recent lab and radiology data.  Preventive health is updated, specifically  Cancer screening and Immunization.  Increased personal stress leading to uncontrolled depression and anxiety with excessive weight gain. Not suicidal or homicidal Increased back apain with weight gain   ROS Denies recent fever or chills. Denies sinus pressure, nasal congestion, ear pain or sore throat. Denies chest congestion, productive cough or wheezing. Denies chest pains, palpitations and leg swelling Denies abdominal pain, nausea, vomiting,diarrhea or constipation.   Denies dysuria, frequency, hesitancy or incontinence.  Denies skin break down or rash.   PE  BP (!) 160/100   Pulse (!) 106   Resp 16   Ht 5' 1 (1.549 m)   Wt 240 lb 1.3 oz (108.9 kg)   SpO2 94%   BMI 45.36 kg/m   Patient alert and oriented and in no cardiopulmonary distress.  HEENT: No facial asymmetry, EOMI,     Neck supple .  Chest: Clear to auscultation bilaterally.  CVS: S1, S2 no murmurs, no S3.Regular rate.  ABD: Soft non tender.   Ext: No edema  MS: Adequate  though reduced ROM spine, , hips and knees.  Skin: Intact, no ulcerations or rash noted.  Psych: Good eye contact, normal affect. Memory intact  anxious and  depressed appearing.  CNS: CN 2-12 intact, power,  normal throughout.no focal deficits noted.   Assessment & Plan  Essential hypertension Uncontrolled add olmesartan  20 mg daily DASH diet and commitment to daily physical activity for a minimum of 30 minutes discussed and encouraged, as a part of hypertension management. The importance of  attaining a healthy weight is also discussed.     12/07/2023    1:57 PM 12/07/2023    1:23 PM 11/08/2023   11:09 AM 10/14/2023   11:01 AM 10/14/2023   10:57 AM 08/10/2023    9:51 AM 08/08/2023    2:04 PM  BP/Weight  Systolic BP 160 150  139   136  Diastolic BP 100 98  84   84  Wt. (Lbs)  240.08   240  231  BMI  45.36 kg/m2   45.35 kg/m2  43.65 kg/m2     Information is confidential and restricted. Go to Review Flowsheets to unlock data.     BP check in 2 weeks  Immunization due After obtaining informed consent, the pneumonia 20  vaccine is  administered , with no adverse effect noted at the time of administration.   Morbid obesity (HCC) Uncontrolled depression and anxiety are major contributors to excess weight gain pt is experiencing, a lot of life stress Patient re-educated about  the importance of commitment to a  minimum of 150 minutes of exercise per week as able.  The importance of healthy food choices with portion control discussed, as well as eating regularly and within a 12 hour window most days. The need to choose clean , green food 50 to 75% of the time is discussed, as well as to make water  the primary drink and set a goal of 64 ounces water  daily.  12/07/2023    1:23 PM 11/08/2023   11:09 AM 10/14/2023   10:57 AM  Weight /BMI  Weight 240 lb 1.3 oz  240 lb  Height 5' 1 (1.549 m)  5' 1 (1.549 m)  BMI 45.36 kg/m2  45.35 kg/m2     Information is confidential and restricted. Go to Review Flowsheets to unlock data.    Deteriorated , to start phentermine  when BP is controlled  Prediabetes Patient educated about the importance of limiting  Carbohydrate intake , the need to commit to daily physical activity for a minimum of 30 minutes , and to commit weight loss. The fact that changes in all these areas will reduce or eliminate all together the development of diabetes is stressed.      Latest Ref Rng & Units 12/13/2023   10:32 AM 06/01/2023   12:08 PM  03/20/2023    3:23 PM 12/08/2022    1:28 PM 12/08/2022    1:26 PM  Diabetic Labs  HbA1c 4.8 - 5.6 % 5.5       Chol 100 - 199 mg/dL 802   752  794    HDL >60 mg/dL 73   85  77    Calc LDL 0 - 99 mg/dL 886   846  876    Triglycerides 0 - 149 mg/dL 61   58  26    Creatinine 0.57 - 1.00 mg/dL 9.02  9.03  9.16   9.14       12/07/2023    1:57 PM 12/07/2023    1:23 PM 11/08/2023   11:09 AM 10/14/2023   11:01 AM 10/14/2023   10:57 AM 08/10/2023    9:51 AM 08/08/2023    2:04 PM  BP/Weight  Systolic BP 160 150  139   136  Diastolic BP 100 98  84   84  Wt. (Lbs)  240.08   240  231  BMI  45.36 kg/m2   45.35 kg/m2  43.65 kg/m2     Information is confidential and restricted. Go to Review Flowsheets to unlock data.       No data to display          Updated lab needed at/ before next visit.   Vitamin D  deficiency Updated lab needed at/ before next visit.   Insomnia due to anxiety and fear Sleep hygiene reviewed and written information offered also. Prescription sent for  medication needed. Continue bedtime xanax   Hyperlipidemia LDL goal <100 Hyperlipidemia:Low fat diet discussed and encouraged.   Lipid Panel  Lab Results  Component Value Date   CHOL 197 12/13/2023   HDL 73 12/13/2023   LDLCALC 113 (H) 12/13/2023   TRIG 61 12/13/2023   CHOLHDL 2.7 12/13/2023     Needs to reduce dietary fat  GAD (generalized anxiety disorder) Currently uncontrolled, treated by Psychiatry and posychology Exceesive life stresses from every angle

## 2023-12-15 ENCOUNTER — Encounter: Payer: Self-pay | Admitting: Family Medicine

## 2023-12-22 ENCOUNTER — Ambulatory Visit (INDEPENDENT_AMBULATORY_CARE_PROVIDER_SITE_OTHER): Payer: Self-pay

## 2023-12-22 ENCOUNTER — Other Ambulatory Visit: Payer: Self-pay | Admitting: Family Medicine

## 2023-12-22 DIAGNOSIS — I10 Essential (primary) hypertension: Secondary | ICD-10-CM

## 2023-12-22 NOTE — Progress Notes (Signed)
 Patient is in office today for a nurse visit for Blood Pressure Check. Patient blood pressure was 116/73, Patient No chest pain, No shortness of breath, No dyspnea on exertion  Will get Hep B #1 at march visit

## 2023-12-25 ENCOUNTER — Ambulatory Visit (HOSPITAL_COMMUNITY): Admitting: Psychiatry

## 2023-12-25 DIAGNOSIS — F39 Unspecified mood [affective] disorder: Secondary | ICD-10-CM

## 2023-12-25 DIAGNOSIS — F411 Generalized anxiety disorder: Secondary | ICD-10-CM

## 2023-12-25 NOTE — Progress Notes (Signed)
 "     Virtual Visit via Video Note  I connected with Marval LITTIE Lesches on 12/25/2023 at 4:09 PM EST by a video enabled telemedicine application and verified that I am speaking with the correct person using two identifiers.  Location: Patient: Home Provider: Home Office   I discussed the limitations of evaluation and management by telemedicine and the availability of in person appointments. The patient expressed understanding and agreed to proceed.   I provided 37 minutes of non-face-to-face time during this encounter.   Winton FORBES Rubinstein, LCSW    THERAPIST PROGRESS NOTE  Session Time: Monday 12/25/2023 4:09 PM - 4:46 PM  Participation Level: Active  Behavioral Response: CasualAlert anxious, depressed,   Type of Therapy: Individual Therapy  Treatment Goals addressed:   Reduce episodes of irritability and lashing out to 1 x every 2 weeks for 30 days per pt's report      Pt will learn and implement relaxation techniques, will practice a relaxation technique daily    ProgressTowards Goals: progressing   Interventions: Supportive, CBT   Summary: SEMAYA VIDA is a 53 y.o. female who is referred for services by PCP Dr. Antonetta due to pt experiencing symptoms of anxiety. Pt sees psychiatrist Dr. Arfeen for medication management. Pt denies any psychiatric hospitalizations. Pt participated in teleheath counseling with son last year. Pt reports experiencing grief and loss issues related to the death  of a cousin who was like a sister in July 2024. She states she can't hardly talk about her without becoming very emotional. She also reports stress related to recently being injured on the job. She says she has been made to feel like I am the blame by her employer. She reports financial stress. She reports additional stress regarding conflict between son and husband resulting in them not talking to leach other since July 2024. She also worries about her mother as she suspects mother may have  dementia. Pt's current symptoms include sadness, worry, tearfulness,  difficulty concentration, fatigue, irritability, sleep difficulty, and restlessness.          Patient last was seen about 5-6 weeks  ago.  She reports continued stress at work but successfully using healthy coping strategies including deep breathing, taking breaks, and calling people in her support system.  She will orts she did not practice body scan meditation as she lost handout provided in last session. She is relieved she did an intake appointment with the [employer] and is hopeful about an investigation being started in January 2026.  Patient states doing better today as she is on Christmas break from her job and will not return until January 6.  Patient reports decreased irritability and lashing out at home.  She reports trying to be more mindful, taking a pause, and walking away to calm self.  She reports increased interest in trying to improve self-care and is particularly concerned about her weight.  She is hopeful about starting a weight management program c named [facility] in January 2026.  She also reports plans to begin some type of exercise program at home in the near future.  Patient reports increased involvement in pleasant activities including socializing with friends.  She also remains very active with her church and sings in 2 choirs.Suicidal/Homicidal: Nowithout intent/plan               Therapist Response: Reviewed symptoms, discussed stressors, facilitated expression of thoughts and feelings, validated feelings, praised and reinforced patient's continued efforts to take breaks and practice  deep breathing, reviewed rationale for and developed plan with patient to practice body scan meditation throughout the day to help manage stress and anxiety, checked out interactive audio activity to patient and provided patient with access code to assist her in her efforts, Diagnosis: Mood disorder/generalized anxiety  disorder  Collaboration of Care: Psychiatrist AEB patient sees psychiatrist Dr. Arfeen for medication management  Patient/Guardian was advised Release of Information must be obtained prior to any record release in order to collaborate their care with an outside provider. Patient/Guardian was advised if they have not already done so to contact the registration department to sign all necessary forms in order for us  to release information regarding their care.   Consent: Patient/Guardian gives verbal consent for treatment and assignment of benefits for services provided during this visit. Patient/Guardian expressed understanding and agreed to proceed.   Winton FORBES Rubinstein, LCSW 12/25/2023   "

## 2023-12-31 ENCOUNTER — Encounter: Payer: Self-pay | Admitting: Family Medicine

## 2024-01-10 ENCOUNTER — Ambulatory Visit: Admitting: Family Medicine

## 2024-01-10 ENCOUNTER — Encounter: Payer: Self-pay | Admitting: Family Medicine

## 2024-01-10 VITALS — BP 130/82 | HR 92 | Resp 18 | Ht 61.0 in | Wt 248.1 lb

## 2024-01-10 DIAGNOSIS — M5410 Radiculopathy, site unspecified: Secondary | ICD-10-CM

## 2024-01-10 DIAGNOSIS — I10 Essential (primary) hypertension: Secondary | ICD-10-CM | POA: Diagnosis not present

## 2024-01-10 DIAGNOSIS — E8941 Symptomatic postprocedural ovarian failure: Secondary | ICD-10-CM

## 2024-01-10 DIAGNOSIS — M545 Low back pain, unspecified: Secondary | ICD-10-CM

## 2024-01-10 MED ORDER — OYSTER SHELL CALCIUM/D3 500-5 MG-MCG PO TABS: 1.0000 | ORAL_TABLET | Freq: Two times a day (BID) | ORAL | 3 refills | Status: AC

## 2024-01-10 MED ORDER — PREDNISONE 20 MG PO TABS: 20.0000 mg | ORAL_TABLET | Freq: Two times a day (BID) | ORAL | 0 refills | Status: AC

## 2024-01-10 MED ORDER — KETOROLAC TROMETHAMINE 60 MG/2ML IM SOLN
60.0000 mg | Freq: Once | INTRAMUSCULAR | Status: AC
  Administered 2024-01-10: 60 mg via INTRAMUSCULAR

## 2024-01-10 MED ORDER — METHYLPREDNISOLONE ACETATE 80 MG/ML IJ SUSP
80.0000 mg | Freq: Once | INTRAMUSCULAR | Status: AC
  Administered 2024-01-10: 80 mg via INTRAMUSCULAR

## 2024-01-10 MED ORDER — TRIAMTERENE-HCTZ 75-50 MG PO TABS: 1.0000 | ORAL_TABLET | Freq: Every day | ORAL | 3 refills | Status: AC

## 2024-01-10 MED ORDER — OLMESARTAN MEDOXOMIL 20 MG PO TABS: 20.0000 mg | ORAL_TABLET | Freq: Every day | ORAL | 1 refills | Status: AC

## 2024-01-10 NOTE — Patient Instructions (Addendum)
 F/U in 4 months  Blood pressure is good   Toradol  60 mg IM and depo medrol  80 mg IM  in office today, and this is followed by prednisone  20 mg one twice daily for 1 week````for back and leg pain   Thanks for choosing [facility], we consider it a privelige to serve you.

## 2024-01-11 ENCOUNTER — Other Ambulatory Visit: Payer: Self-pay | Admitting: Gastroenterology

## 2024-01-15 ENCOUNTER — Encounter: Payer: Self-pay | Admitting: Family Medicine

## 2024-01-15 ENCOUNTER — Encounter (HOSPITAL_COMMUNITY): Payer: Self-pay

## 2024-01-15 NOTE — Progress Notes (Signed)
 "  Stephanie Cannon     MRN: [id number]      DOB: 14-May-1970  Chief Complaint  Patient presents with   Hypertension    Follow up    Leg Pain    Pt complains of pain and numbness of left leg getting worse. No known injury     HPI Stephanie Cannon is here for follow up and re-evaluation of chronic medical conditions, medication management and review of any available recent lab and radiology data.  ROS Denies recent fever or chills. Denies sinus pressure, nasal congestion, ear pain or sore throat. Denies chest congestion, productive cough or wheezing. Denies chest pains, palpitations and leg swelling Denies abdominal pain, nausea, vomiting,diarrhea or constipation.   Denies dysuria, frequency, hesitancy or incontinence. . Denies skin break down or rash.   PE  BP 130/82   Pulse 92   Resp 18   Ht 5' 1 (1.549 m)   Wt 248 lb 1.3 oz (112.5 kg)   SpO2 96%   BMI 46.87 kg/m   Patient alert and oriented and in no cardiopulmonary distress.  HEENT: No facial asymmetry, EOMI,     Neck supple .  Chest: Clear to auscultation bilaterally.  CVS: S1, S2 no murmurs, no S3.Regular rate.  ABD: Soft non tender.   Ext: No edema  MS: Decreased  ROM spine,adequate in  shoulders, hips and knees.  Skin: Intact, no ulcerations or rash noted.  Psych: Good eye contact, normal affect. Memory intact not anxious or depressed appearing.  CNS: CN 2-12 intact, power,  normal throughout.no focal deficits noted.   Assessment & Plan  Essential hypertension Controlled, no change in medication DASH diet and commitment to daily physical activity for a minimum of 30 minutes discussed and encouraged, as a part of hypertension management. The importance of attaining a healthy weight is also discussed.     01/10/2024    3:49 PM 01/10/2024    3:13 PM 12/07/2023    1:57 PM 12/07/2023    1:23 PM 11/08/2023   11:09 AM 10/14/2023   11:01 AM 10/14/2023   10:57 AM  BP/Weight  Systolic BP 130 146 160 150  139    Diastolic BP 82 84 100 98  84   Wt. (Lbs)  248.08  240.08   240  BMI  46.87 kg/m2  45.36 kg/m2   45.35 kg/m2     Information is confidential and restricted. Go to Review Flowsheets to unlock data.     '  Morbid obesity [facility])  Patient re-educated about  the importance of commitment to a  minimum of 150 minutes of exercise per week as able.  The importance of healthy food choices with portion control discussed, as well as eating regularly and within a 12 hour window most days. The need to choose clean , green food 50 to 75% of the time is discussed, as well as to make water  the primary drink and set a goal of 64 ounces water  daily.       01/10/2024    3:13 PM 12/07/2023    1:23 PM 11/08/2023   11:09 AM  Weight /BMI  Weight 248 lb 1.3 oz 240 lb 1.3 oz   Height 5' 1 (1.549 m) 5' 1 (1.549 m)   BMI 46.87 kg/m2 45.36 kg/m2      Information is confidential and restricted. Go to Review Flowsheets to unlock data.    Deteriorated, will start phentermine  to help control appetite  Back pain with left-sided radiculopathy Uncontrolled.Toradol   and depo medrol  administered IM in the office , to be followed by a short course of oral prednisone     Hot flashes due to surgical menopause Recently started on estrogen by [facility]  "

## 2024-01-15 NOTE — Assessment & Plan Note (Signed)
" °  Patient re-educated about  the importance of commitment to a  minimum of 150 minutes of exercise per week as able.  The importance of healthy food choices with portion control discussed, as well as eating regularly and within a 12 hour window most days. The need to choose clean , green food 50 to 75% of the time is discussed, as well as to make water  the primary drink and set a goal of 64 ounces water  daily.       01/10/2024    3:13 PM 12/07/2023    1:23 PM 11/08/2023   11:09 AM  Weight /BMI  Weight 248 lb 1.3 oz 240 lb 1.3 oz   Height 5' 1 (1.549 m) 5' 1 (1.549 m)   BMI 46.87 kg/m2 45.36 kg/m2      Information is confidential and restricted. Go to Review Flowsheets to unlock data.    Deteriorated, will start phentermine  to help control appetite "

## 2024-01-15 NOTE — Assessment & Plan Note (Signed)
 Recently started on estrogen by [facility]

## 2024-01-15 NOTE — Assessment & Plan Note (Signed)
Uncontrolled.Toradol and depo medrol administered IM in the office , to be followed by a short course of oral prednisone   

## 2024-01-15 NOTE — Assessment & Plan Note (Signed)
 Controlled, no change in medication DASH diet and commitment to daily physical activity for a minimum of 30 minutes discussed and encouraged, as a part of hypertension management. The importance of attaining a healthy weight is also discussed.     01/10/2024    3:49 PM 01/10/2024    3:13 PM 12/07/2023    1:57 PM 12/07/2023    1:23 PM 11/08/2023   11:09 AM 10/14/2023   11:01 AM 10/14/2023   10:57 AM  BP/Weight  Systolic BP 130 146 160 150  139   Diastolic BP 82 84 100 98  84   Wt. (Lbs)  248.08  240.08   240  BMI  46.87 kg/m2  45.36 kg/m2   45.35 kg/m2     Information is confidential and restricted. Go to Review Flowsheets to unlock data.     '

## 2024-01-16 ENCOUNTER — Ambulatory Visit (HOSPITAL_COMMUNITY): Admitting: Psychiatry

## 2024-01-16 DIAGNOSIS — F411 Generalized anxiety disorder: Secondary | ICD-10-CM | POA: Diagnosis not present

## 2024-01-16 DIAGNOSIS — F39 Unspecified mood [affective] disorder: Secondary | ICD-10-CM

## 2024-01-16 DIAGNOSIS — F331 Major depressive disorder, recurrent, moderate: Secondary | ICD-10-CM

## 2024-01-16 NOTE — Progress Notes (Signed)
 "    IN-PERSON   THERAPIST PROGRESS NOTE  Session Time: Tuesday 01/16/2023 4:10 - 4:43 PM   Participation Level: Active  Behavioral Response: CasualAlert anxious,  Type of Therapy: Individual Therapy  Treatment Goals addressed:   Reduce episodes of irritability and lashing out to 1 x every 2 weeks for 30 days per pt's report      Pt will learn and implement relaxation techniques, will practice a relaxation technique daily    ProgressTowards Goals: progressing   Interventions: Supportive, CBT   Summary: Stephanie Cannon is a 54 y.o. female who is referred for services by PCP Dr. Antonetta due to pt experiencing symptoms of anxiety. Pt sees psychiatrist Dr. Arfeen for medication management. Pt denies any psychiatric hospitalizations. Pt participated in teleheath counseling with son last year. Pt reports experiencing grief and loss issues related to the death  of a cousin who was like a sister in July 2024. She states she can't hardly talk about her without becoming very emotional. She also reports stress related to recently being injured on the job. She says she has been made to feel like I am the blame by her employer. She reports financial stress. She reports additional stress regarding conflict between son and husband resulting in them not talking to leach other since July 2024. She also worries about her mother as she suspects mother may have dementia. Pt's current symptoms include sadness, worry, tearfulness,  difficulty concentration, fatigue, irritability, sleep difficulty, and restlessness.          Patient last was seen about 2-3 weeks  ago.  She reports enjoy her Christmas break and time off work. However, she returned to work last week and reports experiencing stress and anxiety. She had submitted paper work for [employer] and feels hopeful about her case. She steal feels nervous and continues to state feeling as though she is walking on eggshells at work. She is continuing to use  healthy coping strategies including deep breathing and taking breaks. However she remains very tense at the end of the day. She continues to express concern about her weight but has attended her first appt with [facility] and is hopeful about the program.  She reports additional stress related to increased back pain. She recently had a steroid injection and now is taking 10 day regimen of prednisone . She reports increased irritability, sleep difficulty, and fatigue.   She is trying to be more mindful regarding interaction with husband and respond wisely. She cites recent example. Pt reports continuing to try to maintain involvement in  pleasant activities like socializing with family and attending church.   and Suicidal/Homicidal: Nowithout intent/plan               Therapist Response: Reviewed symptoms, discussed stressors, facilitated expression of thoughts and feelings, validated feelings, praised and reinforced patient's continued efforts to take breaks and practice deep breathing, reviewed rationale for and developed plan with patient to practice progressive muscle relaxation in the evenings, checked out interactive audio activity to patient and provided patient with access code to assist her in her efforts,   Diagnosis: Mood disorder/generalized anxiety disorder  Collaboration of Care: Psychiatrist AEB patient sees psychiatrist Dr. Arfeen for medication management  Patient/Guardian was advised Release of Information must be obtained prior to any record release in order to collaborate their care with an outside provider. Patient/Guardian was advised if they have not already done so to contact the registration department to sign all necessary forms in order for  us  to release information regarding their care.   Consent: Patient/Guardian gives verbal consent for treatment and assignment of benefits for services provided during this visit. Patient/Guardian expressed understanding and agreed to proceed.    Winton FORBES Rubinstein, LCSW 01/16/2024   "

## 2024-01-18 ENCOUNTER — Encounter: Payer: Self-pay | Admitting: Family Medicine

## 2024-01-19 ENCOUNTER — Ambulatory Visit: Admitting: Family Medicine

## 2024-01-19 ENCOUNTER — Encounter: Payer: Self-pay | Admitting: Family Medicine

## 2024-01-19 VITALS — BP 110/80 | HR 105 | Resp 18 | Ht 61.0 in | Wt 242.1 lb

## 2024-01-19 DIAGNOSIS — Z6841 Body Mass Index (BMI) 40.0 and over, adult: Secondary | ICD-10-CM

## 2024-01-19 DIAGNOSIS — R42 Dizziness and giddiness: Secondary | ICD-10-CM | POA: Diagnosis not present

## 2024-01-19 DIAGNOSIS — I10 Essential (primary) hypertension: Secondary | ICD-10-CM | POA: Diagnosis not present

## 2024-01-19 NOTE — Patient Instructions (Addendum)
 F/U in May as before  Blood pressure is good  Reduce phentermine  to half tablet daily  Ensure  you drink 64 ounces  water  daily  Labs today  Thanks for choosing [facility], we consider it a privelige to serve you.

## 2024-01-19 NOTE — Assessment & Plan Note (Signed)
 Controlled, no change in medication   DASH diet and commitment to daily physical activity for a minimum of 30 minutes discussed and encouraged, as a part of hypertension management. The importance of attaining a healthy weight is also discussed.     01/19/2024   11:10 AM 01/19/2024   10:47 AM 01/10/2024    3:49 PM 01/10/2024    3:13 PM 12/07/2023    1:57 PM 12/07/2023    1:23 PM 11/08/2023   11:09 AM  BP/Weight  Systolic BP 110 138 130 146 160 150   Diastolic BP 80 84 82 84 100 98   Wt. (Lbs)  242.12  248.08  240.08   BMI  45.75 kg/m2  46.87 kg/m2  45.36 kg/m2      Information is confidential and restricted. Go to Review Flowsheets to unlock data.

## 2024-01-20 ENCOUNTER — Ambulatory Visit: Payer: Self-pay | Admitting: Family Medicine

## 2024-01-20 LAB — BMP8+EGFR
BUN/Creatinine Ratio: 20 (ref 9–23)
BUN: 21 mg/dL (ref 6–24)
CO2: 26 mmol/L (ref 20–29)
Calcium: 10.2 mg/dL (ref 8.7–10.2)
Chloride: 96 mmol/L (ref 96–106)
Creatinine, Ser: 1.05 mg/dL — ABNORMAL HIGH (ref 0.57–1.00)
Glucose: 83 mg/dL (ref 70–99)
Potassium: 4.5 mmol/L (ref 3.5–5.2)
Sodium: 140 mmol/L (ref 134–144)
eGFR: 63 mL/min/1.73

## 2024-01-21 ENCOUNTER — Encounter: Payer: Self-pay | Admitting: Family Medicine

## 2024-01-21 DIAGNOSIS — R42 Dizziness and giddiness: Secondary | ICD-10-CM | POA: Insufficient documentation

## 2024-01-21 NOTE — Assessment & Plan Note (Addendum)
 Regular water  intake to 64 ounces and eat regularly ,  also reduce phentermine  to half tab to improve sleep Work excuse x 2 days

## 2024-01-21 NOTE — Progress Notes (Signed)
 "  Stephanie Cannon     MRN: [id number]      DOB: Apr 29, 1970  Chief Complaint  Patient presents with   Fatigue    Pt states 2 days ago at work she became very fatigued, sweating, and feeling faint. Blood pressure and sugar dropped low. Felt better after drinking electrolytes     HPI [name] is here for and acute  visit based on above and on messages he had sent  Since starting phentermine  10 days prior she has lost 7 pounds , and my clinical judgement is that food and water  intake are excessively suppressed and she is insomnic.All these contributing to her near  syncopal event ROS Denies recent fever or chills. Denies sinus pressure, nasal congestion, ear pain or sore throat. Denies chest congestion, productive cough or wheezing. Denies chest pains, palpitations and leg swelling   PE  BP 110/80   Pulse (!) 105   Resp 18   Ht 5' 1 (1.549 m)   Wt 242 lb 1.9 oz (109.8 kg)   SpO2 98%   BMI 45.75 kg/m   Patient alert and oriented and in no cardiopulmonary distress.  HEENT: No facial asymmetry, EOMI,     Neck supple .  Chest: Clear to auscultation bilaterally.  CVS: S1, S2 no murmurs, no S3.Regular rate.  ABD: Soft non tender.   Ext: No edema  MS: decreased  ROM spine,  and knees.  Skin: Intact, no ulcerations or rash noted.  Psych: Good eye contact, normal affect. Memory intact not anxious or depressed appearing.  CNS: CN 2-12 intact, power,  normal throughout.no focal deficits noted.   Assessment & Plan Essential hypertension Controlled, no change in medication   DASH diet and commitment to daily physical activity for a minimum of 30 minutes discussed and encouraged, as a part of hypertension management. The importance of attaining a healthy weight is also discussed.     01/19/2024   11:10 AM 01/19/2024   10:47 AM 01/10/2024    3:49 PM 01/10/2024    3:13 PM 12/07/2023    1:57 PM 12/07/2023    1:23 PM 11/08/2023   11:09 AM  BP/Weight  Systolic BP 110 138 130 146  160 150   Diastolic BP 80 84 82 84 100 98   Wt. (Lbs)  242.12  248.08  240.08   BMI  45.75 kg/m2  46.87 kg/m2  45.36 kg/m2      Information is confidential and restricted. Go to Review Flowsheets to unlock data.       Morbid obesity with BMI of 45.0-49.9, adult [facility])  Patient re-educated about  the importance of commitment to a  minimum of 150 minutes of exercise per week as able.  The importance of healthy food choices with portion control discussed, as well as eating regularly and within a 12 hour window most days. The need to choose clean , green food 50 to 75% of the time is discussed, as well as to make water  the primary drink and set a goal of 64 ounces water  daily.       01/19/2024   10:47 AM 01/10/2024    3:13 PM 12/07/2023    1:23 PM  Weight /BMI  Weight 242 lb 1.9 oz 248 lb 1.3 oz 240 lb 1.3 oz  Height 5' 1 (1.549 m) 5' 1 (1.549 m) 5' 1 (1.549 m)  BMI 45.75 kg/m2 46.87 kg/m2 45.36 kg/m2    Decrease phentermine  to half tab daily  Light-headedness Regular water   intake to 64 ounces and eat regularly ,  also reduce phentermine  to half tab to improve sleep Work excuse x 2 days    "

## 2024-01-21 NOTE — Assessment & Plan Note (Signed)
" °  Patient re-educated about  the importance of commitment to a  minimum of 150 minutes of exercise per week as able.  The importance of healthy food choices with portion control discussed, as well as eating regularly and within a 12 hour window most days. The need to choose clean , green food 50 to 75% of the time is discussed, as well as to make water  the primary drink and set a goal of 64 ounces water  daily.       01/19/2024   10:47 AM 01/10/2024    3:13 PM 12/07/2023    1:23 PM  Weight /BMI  Weight 242 lb 1.9 oz 248 lb 1.3 oz 240 lb 1.3 oz  Height 5' 1 (1.549 m) 5' 1 (1.549 m) 5' 1 (1.549 m)  BMI 45.75 kg/m2 46.87 kg/m2 45.36 kg/m2    Decrease phentermine  to half tab daily "

## 2024-01-22 ENCOUNTER — Encounter: Payer: Self-pay | Admitting: Family Medicine

## 2024-01-22 ENCOUNTER — Ambulatory Visit: Payer: Self-pay

## 2024-01-22 NOTE — Telephone Encounter (Signed)
" °  FYI Only or Action Required?: FYI only for provider: appointment scheduled on 01/23/24.  Patient was last seen in primary care on 01/19/2024 by Antonetta Rollene BRAVO, MD.  Called Nurse Triage reporting Shoulder Pain/ lower back pain/right arm pain .  Symptoms began 3 days ago .  Interventions attempted: Prescription medications: Celebrex  and Ice/heat application.  Symptoms are: gradually worsening.  Triage Disposition: See Physician Within 24 Hours  Patient/caregiver understands and will follow disposition?: yes      Message from [place] S sent at 01/22/2024  8:43 AM EST  Reason for Triage: Excruciating pain in back and shoulders.   Reason for Disposition  [1] Unable to use arm at all AND [2] because of shoulder pain or stiffness  Answer Assessment - Initial Assessment Questions 1. ONSET: When did the pain start?     3 days ago  2. LOCATION: Where is the pain located?     Right shoulder, lower back, right arm pain,  3. PAIN: How bad is the pain? (Scale 1-10; or mild, moderate, severe)     Shoulder: 8/10 right arm: 8/10 lower back: 8/10 (shoulder sore to touch) 4. WORK OR EXERCISE: Has there been any recent work or exercise that involved this part of the body?     no 5. CAUSE: What do you think is causing the shoulder pain?     Unknown  6. OTHER SYMPTOMS: Do you have any other symptoms? (e.g., neck pain, swelling, rash, fever, numbness, weakness)     Muscle spasms lower back, numbness to left leg and feet (little toe and the next toe beside it), hears crackling noise at neck  Protocols used: Shoulder Pain-A-AH  "

## 2024-01-22 NOTE — Telephone Encounter (Signed)
Noted patient scheduled

## 2024-01-23 ENCOUNTER — Ambulatory Visit: Payer: Self-pay

## 2024-01-23 VITALS — BP 121/77 | HR 106 | Ht 61.0 in | Wt 244.1 lb

## 2024-01-23 DIAGNOSIS — M25511 Pain in right shoulder: Secondary | ICD-10-CM | POA: Diagnosis not present

## 2024-01-23 DIAGNOSIS — M5442 Lumbago with sciatica, left side: Secondary | ICD-10-CM | POA: Diagnosis not present

## 2024-01-23 MED ORDER — TIZANIDINE HCL 4 MG PO TABS: 2.0000 mg | ORAL_TABLET | Freq: Every day | ORAL | 2 refills | Status: AC

## 2024-01-23 NOTE — Progress Notes (Signed)
 "  Established Patient Office Visit  Subjective   Patient ID: Stephanie Cannon, female    DOB: September 18, 1970  Age: 54 y.o. MRN: [id number]  Chief Complaint  Patient presents with   Shoulder Pain    Shoulder pain ongoing for several days has not been able to lift shoulder up much. Unsure what could have caused the pain.   Back Pain    Back pain ongoing for several days. Unsure what could have caused the pain.    HPI Discussed the use of AI scribe software for clinical note transcription with the patient, who gave verbal consent to proceed.  History of Present Illness    Stephanie Cannon is a 54 year old female with a history of back problems and sciatica who presents with severe neck and shoulder pain radiating to her leg.  Neck and shoulder pain - Severe neck and shoulder pain with 'cracking' sensation on movement - Pain radiates to the leg - Tenderness in the shoulder with pain to touch - Numbness in the last two toes - History of rotator cuff surgery and bulging disc (June of previous year) - History of sciatica and chronic back problems - Pain affects sleep quality - Difficulty reaching around to back and performing personal hygiene tasks - Right-handed, unable to attend church or work due to pain  Pain management and medication response - Recent steroid injection and course of oral steroids without significant relief - Took leftover oxycodone  from previous surgery without relief - Uses ice on back, shoulder, and neck for pain control - Took Flexeril  and tramadol ; Flexeril  causes feeling of being 'wired' rather than sleepy - Currently not taking duloxetine  (Cymbalta ) as advised while on prednisone  - Takes duloxetine  at night for fibromyalgia  Neurological and systemic symptoms - Episode of low blood sugar and low blood pressure last Wednesday, resulting in faintness - Mostly at home since the episode, except for a doctor visit on Friday  Family history and concerns - Family  history of arthritis (father) and fibromyalgia (cousins) - Concerned about possible fibromyalgia given current symptoms     Patient Active Problem List   Diagnosis Date Noted   Acute pain of right shoulder 01/26/2024   Light-headedness 01/21/2024   COVID-19 10/12/2023   Flank pain, acute 06/01/2023   Pain of left lower leg 01/30/2023   B12 deficiency 11/29/2022   Encounter for immunization 11/13/2022   Asthma due to environmental allergies 01/09/2022   Abnormal MRI of head 12/02/2021   Mass of nasopharynx 11/23/2021   Paresthesia 10/12/2021   Neck pain, chronic 10/12/2021   Meralgia paresthetica of left side 10/12/2021   Neck pain on left side 05/16/2021   Low back pain with left-sided sciatica 11/25/2020   Depression, major, single episode, severe (HCC) 11/02/2020   Shoulder pain, left 05/01/2020   Vitamin D  deficiency 05/01/2020   GAD (generalized anxiety disorder) 02/08/2020   Back pain with left-sided radiculopathy 08/20/2019   Neck pain 06/18/2019   Insomnia 09/19/2018   Superficial inflammatory acne vulgaris 07/14/2018   Urinary incontinence 01/23/2018   Knee pain, left anterior 11/01/2017   Dysphonia 07/05/2016   Vocal fold polyp 07/05/2016   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   Spondylolisthesis of lumbar region 01/22/2014   Accessory skin tags 04/03/2013   Prediabetes 12/17/2012   Metabolic syndrome X 07/30/2012   Hot flashes due to surgical menopause 12/06/2011   Morbid obesity (HCC) 04/20/2011   Anxiety 03/01/2011   Hyperlipidemia LDL goal <100 07/08/2010  Hemorrhoids 04/27/2009   Morbid obesity with BMI of 45.0-49.9, adult (HCC) 06/26/2007   CARPAL TUNNEL SYNDROME 06/26/2007   Essential hypertension 06/26/2007   GERD (gastroesophageal reflux disease) 06/26/2007   Spasm of muscle of lower back 06/26/2007    ROS    Objective:     BP 121/77   Pulse (!) 106   Ht 5' 1 (1.549 m)   Wt 244 lb 1.9 oz (110.7 kg)   SpO2 98%   BMI 46.13 kg/m   BP Readings from Last 3 Encounters:  01/23/24 121/77  01/19/24 110/80  01/10/24 130/82   Wt Readings from Last 3 Encounters:  01/23/24 244 lb 1.9 oz (110.7 kg)  01/19/24 242 lb 1.9 oz (109.8 kg)  01/10/24 248 lb 1.3 oz (112.5 kg)    Physical Exam Vitals and nursing note reviewed.  Constitutional:      Appearance: Normal appearance. She is obese.  HENT:     Head: Normocephalic.  Eyes:     Extraocular Movements: Extraocular movements intact.     Pupils: Pupils are equal, round, and reactive to light.  Cardiovascular:     Rate and Rhythm: Normal rate and regular rhythm.  Pulmonary:     Effort: Pulmonary effort is normal.     Breath sounds: Normal breath sounds.  Musculoskeletal:     Cervical back: Normal range of motion and neck supple.  Neurological:     Mental Status: She is alert and oriented to person, place, and time.  Psychiatric:        Mood and Affect: Mood normal.        Thought Content: Thought content normal.      No results found for any visits on 01/23/24.    The 10-year ASCVD risk score (Arnett DK, et al., 2019) is: 2.8%    Assessment & Plan:   Problem List Items Addressed This Visit       Nervous and Auditory   Low back pain with left-sided sciatica   Chronic lumbar radiculopathy with recent exacerbation and numbness in the last two toes, suggestive of nerve involvement. History of back surgery. - Advised follow-up with neurosurgeon, Dr. Mavis, for evaluation of leg numbness and potential nerve involvement.      Relevant Medications   tiZANidine  (ZANAFLEX ) 4 MG tablet     Other   Acute pain of right shoulder - Primary   Severe acute right shoulder pain with tenderness and limited movement, likely bursitis or tendinitis. Previous left shoulder rotator cuff surgery. Recent steroid injection and oral prednisone  provided limited relief. - Referred to orthopedic specialist, Dr. Dozier, for evaluation and possible steroid injection. - Prescribed  tizanidine  for muscle relaxation and sleep aid. - Advised continuation of ice application. - Recommended NSAIDs like ibuprofen  or Aleve  three times daily with meals.      Relevant Medications   tiZANidine  (ZANAFLEX ) 4 MG tablet    Return for as needed.    Leita Longs, FNP  "

## 2024-01-26 ENCOUNTER — Encounter: Payer: Self-pay | Admitting: Family Medicine

## 2024-01-26 DIAGNOSIS — M25511 Pain in right shoulder: Secondary | ICD-10-CM | POA: Insufficient documentation

## 2024-01-26 NOTE — Assessment & Plan Note (Signed)
 Chronic lumbar radiculopathy with recent exacerbation and numbness in the last two toes, suggestive of nerve involvement. History of back surgery. - Advised follow-up with neurosurgeon, Dr. Mavis, for evaluation of leg numbness and potential nerve involvement.

## 2024-01-26 NOTE — Assessment & Plan Note (Signed)
 Severe acute right shoulder pain with tenderness and limited movement, likely bursitis or tendinitis. Previous left shoulder rotator cuff surgery. Recent steroid injection and oral prednisone  provided limited relief. - Referred to orthopedic specialist, Dr. Dozier, for evaluation and possible steroid injection. - Prescribed tizanidine  for muscle relaxation and sleep aid. - Advised continuation of ice application. - Recommended NSAIDs like ibuprofen  or Aleve  three times daily with meals.

## 2024-01-30 ENCOUNTER — Ambulatory Visit (HOSPITAL_COMMUNITY): Admitting: Psychiatry

## 2024-01-30 DIAGNOSIS — Z0389 Encounter for observation for other suspected diseases and conditions ruled out: Secondary | ICD-10-CM

## 2024-01-31 NOTE — Progress Notes (Signed)
 Pt did not call or show for appt. Therapist waited 10 minutes past appt time.

## 2024-02-01 ENCOUNTER — Encounter (HOSPITAL_COMMUNITY): Payer: Self-pay | Admitting: *Deleted

## 2024-02-01 ENCOUNTER — Encounter: Payer: Self-pay | Admitting: Family Medicine

## 2024-02-01 ENCOUNTER — Telehealth: Payer: Self-pay | Admitting: Family Medicine

## 2024-02-01 NOTE — Telephone Encounter (Signed)
 Obtained

## 2024-02-01 NOTE — Telephone Encounter (Signed)
 Placard  Copied Noted Sleeved Original placed provider box (Dr Antonetta) Copy placed front desk folder

## 2024-02-07 ENCOUNTER — Other Ambulatory Visit (HOSPITAL_COMMUNITY): Payer: Self-pay | Admitting: *Deleted

## 2024-02-07 DIAGNOSIS — F5101 Primary insomnia: Secondary | ICD-10-CM

## 2024-02-07 DIAGNOSIS — F411 Generalized anxiety disorder: Secondary | ICD-10-CM

## 2024-02-07 MED ORDER — AMITRIPTYLINE HCL 50 MG PO TABS: 50.0000 mg | ORAL_TABLET | Freq: Every day | ORAL | 0 refills | Status: AC

## 2024-02-13 ENCOUNTER — Ambulatory Visit (HOSPITAL_COMMUNITY): Admitting: Psychiatry

## 2024-02-14 ENCOUNTER — Telehealth (HOSPITAL_COMMUNITY): Admitting: Psychiatry

## 2024-02-27 ENCOUNTER — Ambulatory Visit (HOSPITAL_COMMUNITY): Admitting: Psychiatry

## 2024-03-12 ENCOUNTER — Ambulatory Visit (HOSPITAL_COMMUNITY): Admitting: Psychiatry

## 2024-03-26 ENCOUNTER — Ambulatory Visit (HOSPITAL_COMMUNITY): Admitting: Psychiatry

## 2024-05-07 ENCOUNTER — Ambulatory Visit: Payer: Self-pay | Admitting: Family Medicine
